# Patient Record
Sex: Male | Born: 1970
Health system: Southern US, Community
[De-identification: ages and names within clinical notes are randomized; demographics above are authoritative.]

## PROBLEM LIST (undated history)

## (undated) DIAGNOSIS — R42 Dizziness and giddiness: Secondary | ICD-10-CM

## (undated) DIAGNOSIS — S9032XA Contusion of left foot, initial encounter: Secondary | ICD-10-CM

## (undated) DIAGNOSIS — K219 Gastro-esophageal reflux disease without esophagitis: Secondary | ICD-10-CM

## (undated) DIAGNOSIS — R55 Syncope and collapse: Secondary | ICD-10-CM

## (undated) DIAGNOSIS — R002 Palpitations: Secondary | ICD-10-CM

## (undated) DIAGNOSIS — E119 Type 2 diabetes mellitus without complications: Secondary | ICD-10-CM

## (undated) DIAGNOSIS — I2510 Atherosclerotic heart disease of native coronary artery without angina pectoris: Secondary | ICD-10-CM

## (undated) DIAGNOSIS — R9431 Abnormal electrocardiogram [ECG] [EKG]: Secondary | ICD-10-CM

## (undated) HISTORY — PX: NO PAST SURGERIES: SHX2092

## (undated) HISTORY — DX: Gastro-esophageal reflux disease without esophagitis: K21.9

## (undated) HISTORY — DX: Atherosclerotic heart disease of native coronary artery without angina pectoris: I25.10

## (undated) HISTORY — DX: Palpitations: R00.2

## (undated) HISTORY — DX: Syncope and collapse: R55

## (undated) HISTORY — DX: Abnormal electrocardiogram (ECG) (EKG): R94.31

## (undated) HISTORY — DX: Dizziness and giddiness: R42

## (undated) HISTORY — DX: Contusion of left foot, initial encounter: S90.32XA

---

## 1999-05-28 ENCOUNTER — Emergency Department (HOSPITAL_COMMUNITY): Admission: EM | Admit: 1999-05-28 | Discharge: 1999-05-28 | Payer: Self-pay | Admitting: *Deleted

## 2009-06-13 ENCOUNTER — Emergency Department (HOSPITAL_COMMUNITY): Admission: EM | Admit: 2009-06-13 | Discharge: 2009-06-13 | Payer: Self-pay | Admitting: Emergency Medicine

## 2010-04-27 ENCOUNTER — Observation Stay (HOSPITAL_COMMUNITY)
Admission: EM | Admit: 2010-04-27 | Discharge: 2010-04-29 | Payer: Self-pay | Source: Home / Self Care | Attending: Internal Medicine | Admitting: Internal Medicine

## 2010-04-27 LAB — URINALYSIS, ROUTINE W REFLEX MICROSCOPIC
Hgb urine dipstick: NEGATIVE
Ketones, ur: NEGATIVE mg/dL
Protein, ur: NEGATIVE mg/dL
Urobilinogen, UA: 0.2 mg/dL (ref 0.0–1.0)

## 2010-04-27 LAB — DIFFERENTIAL
Basophils Absolute: 0 10*3/uL (ref 0.0–0.1)
Basophils Relative: 0 % (ref 0–1)
Lymphocytes Relative: 25 % (ref 12–46)
Lymphs Abs: 1.7 10*3/uL (ref 0.7–4.0)
Monocytes Relative: 6 % (ref 3–12)
Neutro Abs: 4.7 10*3/uL (ref 1.7–7.7)
Neutrophils Relative %: 68 % (ref 43–77)

## 2010-04-27 LAB — BASIC METABOLIC PANEL
BUN: 9 mg/dL (ref 6–23)
CO2: 25 mEq/L (ref 19–32)
Calcium: 8.9 mg/dL (ref 8.4–10.5)
Chloride: 98 mEq/L (ref 96–112)
GFR calc Af Amer: >60 mL/min (ref >60)
GFR calc non Af Amer: >60 mL/min (ref >60)
Potassium: 4 mEq/L (ref 3.5–5.1)

## 2010-04-27 LAB — CBC
Hemoglobin: 13.6 g/dL (ref 13.0–17.0)
Platelets: 234 10*3/uL (ref 150–400)
WBC: 6.9 10*3/uL (ref 4.0–10.5)

## 2010-04-27 LAB — URINE MICROSCOPIC-ADD ON: Urine-Other: NONE SEEN

## 2010-04-28 LAB — COMPREHENSIVE METABOLIC PANEL
Albumin: 3 g/dL — ABNORMAL LOW (ref 3.5–5.2)
Calcium: 8.4 mg/dL (ref 8.4–10.5)
GFR calc Af Amer: >60 mL/min (ref >60)
GFR calc non Af Amer: >60 mL/min (ref >60)
Potassium: 3.9 mEq/L (ref 3.5–5.1)
Sodium: 135 mEq/L (ref 135–145)
Total Bilirubin: 0.6 mg/dL (ref 0.3–1.2)
Total Protein: 6.2 g/dL (ref 6.0–8.3)

## 2010-04-28 LAB — LIPID PANEL
Cholesterol: 186 mg/dL (ref 0–200)
HDL: 27 mg/dL — ABNORMAL LOW (ref >39)
Total CHOL/HDL Ratio: 6.9 RATIO
Triglycerides: 184 mg/dL — ABNORMAL HIGH (ref <150)

## 2010-04-28 LAB — CBC
HCT: 37.3 % — ABNORMAL LOW (ref 39.0–52.0)
Hemoglobin: 12.6 g/dL — ABNORMAL LOW (ref 13.0–17.0)
MCH: 28.1 pg (ref 26.0–34.0)
Platelets: 196 10*3/uL (ref 150–400)
RDW: 12.6 % (ref 11.5–15.5)

## 2010-04-28 LAB — HEMOGLOBIN A1C: Mean Plasma Glucose: 372 mg/dL — ABNORMAL HIGH (ref <117)

## 2010-04-28 LAB — GLUCOSE, CAPILLARY: Glucose-Capillary: 250 mg/dL — ABNORMAL HIGH (ref 70–99)

## 2010-04-29 LAB — GLUCOSE, CAPILLARY: Glucose-Capillary: 274 mg/dL — ABNORMAL HIGH (ref 70–99)

## 2010-04-30 LAB — GLUCOSE, CAPILLARY: Glucose-Capillary: 470 mg/dL — ABNORMAL HIGH (ref 70–99)

## 2010-04-30 NOTE — H&P (Addendum)
NAMESALIH, WILLIAMSON               ACCOUNT NO.:  [id number]  MEDICAL RECORD NO.:  [id number]          PATIENT TYPE:  EMS  LOCATION:  ED                           FACILITY:  [facility]  PHYSICIAN:  Peggye Pitt, M.D. DATE OF BIRTH:  1971-03-22  DATE OF ADMISSION:  04/27/2010 DATE OF DISCHARGE:                             HISTORY & PHYSICAL   PRIMARY CARE PHYSICIAN:  He is unassigned.  CHIEF COMPLAINT:  Dizziness and fatigue.  HISTORY OF PRESENT ILLNESS:  Mr. Bushong is a very pleasant 40 year old Hispanic male who speaks English very well who was born and raised in [place], but his descenders are [place]. He notes that for the past approximately 6 months, he has been feeling more dizzy and fatigued.  He works as a Surveyor, minerals and has noticed that while walking up 3 flights of steps has become very difficult for him.  He has also noticed an unintentional 30-pound weight loss over the past 2 months as well as significant polyuria, polydipsia, and polyphagia.  He does not have a primary care physician.  Because of this, he came to the emergency department today where he was found to have a blood sugar of 470 and we are asked to assist with evaluation.  ALLERGIES:  No known drug allergies.  PAST MEDICAL HISTORY:  Nonsignificant.  HOME MEDICATIONS:  None.  SOCIAL HISTORY:  Denies any alcohol, tobacco, or illicit drug use.  He has 4 children, aged 4 down to one.  Works as a Associate Professor.  FAMILY HISTORY:  Significant for diabetes in mother, a brother, and multiple aunts and uncles.  REVIEW OF SYSTEMS:  Negative except as already mentioned in history of present illness.  PHYSICAL EXAMINATION:  GENERAL:  He is alert, awake, and oriented x3, does not appear to be in any distress.  Does have dry mucous membranes. VITAL SIGNS:  On admission, blood pressure 134/82, heart rate 95, respirations 16, saturations 99% on room air with a temperature of 98.2. HEENT:   Normocephalic, atraumatic.  Pupils are equal, round, and reactive with intact extraocular movements. NECK:  Supple.  No JVD, no lymphadenopathy, no bruits, no goiter. HEART:  Regular rate and rhythm with no murmurs, rubs, or gallops. LUNGS:  Clear to auscultation bilaterally. ABDOMEN:  Soft, nontender, and nondistended with positive bowel sounds. EXTREMITIES:  No clubbing, cyanosis, or edema with positive pedal pulses. NEUROLOGIC:  Grossly intact and nonfocal.  LABORATORY DATA:  On admission, sodium 133, potassium 4.0, chloride 98, bicarbonate 25, BUN 9, creatinine 0.87, glucose of 513, WBCs 6.9, hemoglobin 13.6, platelets of 234.  Urinalysis that shows greater than 1000 glucose and is significant for no protein and otherwise negative.  ASSESSMENT AND PLAN:  New-onset diabetes mellitus.  This could certainly explain all of Bandon symptoms including weakness, dizziness, fatigue, polydipsia, polyuria, polyphagia, weight loss.  It appears that he is also developing some early onset peripheral neuropathy given tingling in his toes and fingertips.  At this point, I believe that an admission to the hospital for 23-hour observation is warranted mainly for IV hydration as well as diabetic education.  We will also  need to have case management of system with finding a primary care physician as well as with medication assistance.  It is most likely that Chawn is a type 1 diabetic.  His A1c is pending at this time.  Regardless, whether he is a type 1 or type 2 diabetic, I believe he will need insulin at least transiently at the beginning of his treatment.  For that reason, I have started him on Lantus 10 units subcutaneous as well as a moderate sliding scale insulin.  In the emergency department, he was given 15 units of NovoLog and his most recent sugar was 257.  I have also ordered a microalbumin if he does have evidence of microalbuminuria that he may benefit from a low-dose ACE inhibitor prior  to discharge, for renal protection.  For prophylaxis, he is completely ambulatory, but we will place SCDs while he is in bed.     Peggye Pitt, M.D.     EH/MEDQ  D:  04/27/2010  T:  04/27/2010  Job:  [id number]  Electronically Signed by Peggye Pitt M.D. on 04/30/2010 08:08:34 AM

## 2010-05-10 NOTE — Discharge Summary (Signed)
  Micheal Neal, Micheal Neal               ACCOUNT NO.:  [id number]  MEDICAL RECORD NO.:  [id number]          PATIENT TYPE:  OBV  LOCATION:  1302                         FACILITY:  [facility]  PHYSICIAN:  Calvert Cantor, M.D.     DATE OF BIRTH:  1971/02/08  DATE OF ADMISSION:  04/27/2010 DATE OF DISCHARGE:                              DISCHARGE SUMMARY   PRIMARY CARE PHYSICIAN:  None.  PRESENTING COMPLAINT:  Dizziness and fatigue.  DISCHARGE DIAGNOSES: 1. New onset diabetes, insulin-requiring, still uncertain whether it     is type 1 or type 2. 2. Hyperlipidemia. 3. Obesity.  DISCHARGE MEDICATIONS: 1. Lantus 14 units at bedtime.  He is advised to keep titrating his     Lantus up by 2 units every other night until his a.m. or fasting     sugar is less than 100. 2. NovoLog.  He is to take 4 units with each meal and an additional     NovoLog based on sliding scale also before each meal. 3. Zocor 10 mg at bedtime.  HOSPITAL COURSE:  This is a 40 year old male who came into the hospital with complaints of severe fatigue, dizziness, and a 30-pound weight loss over the past 2 months.  The patient was found to have diabetes.  He was admitted for inpatient treatment because he does not have a PCP.  He was initiated on insulin.  He has been given 10 units of Lantus every night. Sugars are still running in the high 200s.  This morning, it was 274. Last night, it was 246 and therefore increasing his Lantus slightly and recommending to him that he increases as I have advised until sugars are better controlled.  He states he does feel much stronger.  He is not urinating as frequently anymore.  The patient does not have a PCP and does not have any insurance and his social worker was working on finding him PCP and helping him apply for Medicaid to see if he is eligible.  In addition, he will be receiving his medications on discharge today to our hospital's indigent fund.  The patient has been educated  thoroughly regarding diabetes.  He has had nutritionist consult as well.  I have recommended that he lose at least another 30 pounds through diet and exercise.  PHYSICAL EXAMINATION ON DISCHARGE:  LUNGS:  Clear. HEART: Regular rate and rhythm.  No murmurs. ABDOMEN:  Soft, nontender, nondistended.  Bowel sounds positive.  CONDITION ON DISCHARGE:  Stable.  FOLLOW UP:  Will plan on PTT for him to follow up within 1 to 2 weeks.  Time on discharge was 60 minutes.     Calvert Cantor, M.D.     SR/MEDQ  D:  04/29/2010  T:  04/29/2010  Job:  [id number]  Electronically Signed by Calvert Cantor M.D. on 05/10/2010 12:28:52 PM

## 2010-06-25 LAB — COMPREHENSIVE METABOLIC PANEL WITH GFR
ALT: 124 U/L — ABNORMAL HIGH (ref 0–53)
AST: 133 U/L — ABNORMAL HIGH (ref 0–37)
Albumin: 4.2 g/dL (ref 3.5–5.2)
Alkaline Phosphatase: 85 U/L (ref 39–117)
BUN: 13 mg/dL (ref 6–23)
CO2: 29 meq/L (ref 19–32)
Calcium: 9.3 mg/dL (ref 8.4–10.5)
Chloride: 101 meq/L (ref 96–112)
Creatinine, Ser: 1.06 mg/dL (ref 0.4–1.5)
GFR calc non Af Amer: >60 mL/min
Glucose, Bld: 192 mg/dL — ABNORMAL HIGH (ref 70–99)
Potassium: 3.9 meq/L (ref 3.5–5.1)
Sodium: 137 meq/L (ref 135–145)
Total Bilirubin: 0.9 mg/dL (ref 0.3–1.2)
Total Protein: 7.8 g/dL (ref 6.0–8.3)

## 2010-06-25 LAB — CBC
HCT: 43.7 % (ref 39.0–52.0)
Hemoglobin: 14.5 g/dL (ref 13.0–17.0)
MCHC: 33.2 g/dL (ref 30.0–36.0)
Platelets: 230 10*3/uL (ref 150–400)
RDW: 13.1 % (ref 11.5–15.5)

## 2010-06-25 LAB — POCT CARDIAC MARKERS
CKMB, poc: 3.1 ng/mL (ref 1.0–8.0)
Myoglobin, poc: 89 ng/mL (ref 12–200)

## 2010-06-25 LAB — DIFFERENTIAL
Basophils Absolute: 0 K/uL (ref 0.0–0.1)
Basophils Relative: 0 % (ref 0–1)
Eosinophils Absolute: 0.1 K/uL (ref 0.0–0.7)
Eosinophils Relative: 2 % (ref 0–5)
Lymphocytes Relative: 26 % (ref 12–46)
Lymphs Abs: 2 K/uL (ref 0.7–4.0)
Monocytes Absolute: 0.4 K/uL (ref 0.1–1.0)
Monocytes Relative: 5 % (ref 3–12)
Neutro Abs: 5.2 K/uL (ref 1.7–7.7)
Neutrophils Relative %: 68 % (ref 43–77)

## 2011-01-01 ENCOUNTER — Emergency Department (HOSPITAL_COMMUNITY): Payer: Self-pay

## 2011-01-01 ENCOUNTER — Emergency Department (HOSPITAL_COMMUNITY)
Admission: EM | Admit: 2011-01-01 | Discharge: 2011-01-01 | Disposition: A | Discharge status: Home / Self Care | Payer: Self-pay | Attending: Emergency Medicine | Admitting: Emergency Medicine

## 2011-01-01 DIAGNOSIS — R059 Cough, unspecified: Secondary | ICD-10-CM | POA: Insufficient documentation

## 2011-01-01 DIAGNOSIS — E119 Type 2 diabetes mellitus without complications: Secondary | ICD-10-CM | POA: Insufficient documentation

## 2011-01-01 DIAGNOSIS — Z794 Long term (current) use of insulin: Secondary | ICD-10-CM | POA: Insufficient documentation

## 2011-01-01 DIAGNOSIS — Z91199 Patient's noncompliance with other medical treatment and regimen due to unspecified reason: Secondary | ICD-10-CM | POA: Insufficient documentation

## 2011-01-01 DIAGNOSIS — R071 Chest pain on breathing: Secondary | ICD-10-CM | POA: Insufficient documentation

## 2011-01-01 DIAGNOSIS — Z9119 Patient's noncompliance with other medical treatment and regimen: Secondary | ICD-10-CM | POA: Insufficient documentation

## 2011-01-01 DIAGNOSIS — R05 Cough: Secondary | ICD-10-CM | POA: Insufficient documentation

## 2011-01-01 LAB — GLUCOSE, CAPILLARY: Glucose-Capillary: 407 mg/dL — ABNORMAL HIGH (ref 70–99)

## 2011-01-01 LAB — POCT I-STAT, CHEM 8
Chloride: 99 mEq/L (ref 96–112)
Creatinine, Ser: 0.8 mg/dL (ref 0.50–1.35)
Glucose, Bld: 395 mg/dL — ABNORMAL HIGH (ref 70–99)
Hemoglobin: 14.6 g/dL (ref 13.0–17.0)
Potassium: 4.4 mEq/L (ref 3.5–5.1)

## 2011-01-02 LAB — GLUCOSE, CAPILLARY

## 2011-02-20 ENCOUNTER — Ambulatory Visit (INDEPENDENT_AMBULATORY_CARE_PROVIDER_SITE_OTHER): Payer: Self-pay | Admitting: Family Medicine

## 2011-02-20 ENCOUNTER — Encounter: Payer: Self-pay | Admitting: Family Medicine

## 2011-02-20 DIAGNOSIS — E1165 Type 2 diabetes mellitus with hyperglycemia: Secondary | ICD-10-CM | POA: Insufficient documentation

## 2011-02-20 DIAGNOSIS — E119 Type 2 diabetes mellitus without complications: Secondary | ICD-10-CM

## 2011-02-20 LAB — POCT GLYCOSYLATED HEMOGLOBIN (HGB A1C): Hemoglobin A1C: 10.2

## 2011-02-20 MED ORDER — ROSUVASTATIN CALCIUM 10 MG PO TABS: 10.0000 mg | ORAL_TABLET | Freq: Every day | ORAL | Status: DC

## 2011-02-20 MED ORDER — ASPIRIN EC 81 MG PO TBEC: 81.0000 mg | DELAYED_RELEASE_TABLET | Freq: Every day | ORAL | Status: AC

## 2011-02-20 MED ORDER — METFORMIN HCL 500 MG PO TABS: 500.0000 mg | ORAL_TABLET | Freq: Two times a day (BID) | ORAL | Status: DC

## 2011-02-20 MED ORDER — INSULIN ASPART 100 UNIT/ML ~~LOC~~ SOLN: 4.0000 [IU] | Freq: Three times a day (TID) | SUBCUTANEOUS | Status: DC

## 2011-02-20 MED ORDER — INSULIN GLARGINE 100 UNIT/ML ~~LOC~~ SOLN: 20.0000 [IU] | Freq: Every day | SUBCUTANEOUS | Status: DC

## 2011-02-20 NOTE — Patient Instructions (Signed)
It was a pleasure to meet you today; I am glad to be your new primary care doctor.  I wrote you prescriptions for the insulins and for metformin 500mg , take 1 tablet with meals in the morning.  After 2 weeks, you may increase to 1 tablet twice daily (breakfast and dinner).   I wrote for the Crestor 10mg  at bedtime.   I recommend you take a baby aspirin (81mg ) one time daily, for heart health.  I would like to see you back in the office in the next 4 to 6 weeks.  Please call with any other issues before then.

## 2011-02-20 NOTE — Progress Notes (Signed)
  Subjective:    Patient ID: Micheal Neal, male    DOB: 12-23-1970, 40 y.o.   MRN: [id number]  HPI New patient to our practice, comes to establish care for his DM.  Had been seenin ED in January 2012 and diagnosed with DM, had A1C >14%.  Started on Lantus and Novolog there, but ran out.  Was around 6 months without meds.  Also prescribed Crestor 10mg  daily at ED.  Qualified for MAP program, where he gets meds.  Going to talk with Rudell Cobb for [employer].   Family Hx; Mother with heart disease, HTN and lipids.  Brothers with DM. Father died at age 73 of "Clot in brain", had elevated lipids as well.   Social Hx; Works sporadically helping with painting.  Has 4 children (2 from ex-wife, 2 from present wife).  Never smoker, no alcohol and no drug use.   Review of Systems  Denies weight changes now (initially with 40 lb weight loss in January); no polyuria or polydipsia now (had these before insulin therapy); no fevers or chills.  No chest pain presently (had this in ED in early October, ER notes reviewed).  No cough or shortness of breath     Objective:   Physical Exam Well appearing, no apparent distress HEENT Neck supple. PERRL. Moist mucus membranes. No cervical adenoapthy COR S1S2, no extra sounds PULM Clear bilaterally. No rales or wheezes ABD Soft, nontender. LEs: No foot lesions or maceration between toes.  Sensation grossly intact in all areas of foot on exam. No edema.  Palpable dp pulses bilaterally.       Assessment & Plan:

## 2011-02-20 NOTE — Assessment & Plan Note (Signed)
Patient diagnosed with DM in ED in January 2012 after found to have profound hyperglycemia and an A1C>14%; started on Lantus and Novolog from ED then, but ran out and was around 6 months without insulin.  Recently restarted insulin 2 weeks ago, has had CBGs at home ranging from 120-210 on glucometer (brought it in today).  No longer having polydipsia or polyuria, or weight loss.    To check A1C today, as well as baseline labs.  Will need retinal exam, urine microalbumin in future visits.  Will start on Metformin 500mg  one time daily for 2 weeks, then up to BID.  Discussed that Metformin should not bottom his sugars out, however should be on the lookout for symptoms of hypoglycemia (discussed this).  May be able to reduce Lantus dosing if improved glycemic control.   WIll discuss ACEI with him at future visit, as well as nutritionist consult in our office.   Follow up 6 weeks.

## 2011-02-21 LAB — COMPREHENSIVE METABOLIC PANEL
ALT: 37 U/L (ref 0–53)
AST: 44 U/L — ABNORMAL HIGH (ref 0–37)
Albumin: 4.6 g/dL (ref 3.5–5.2)
Alkaline Phosphatase: 85 U/L (ref 39–117)
Potassium: 4.2 mEq/L (ref 3.5–5.3)
Sodium: 138 mEq/L (ref 135–145)
Total Protein: 7.6 g/dL (ref 6.0–8.3)

## 2011-03-06 ENCOUNTER — Encounter: Payer: Self-pay | Admitting: Family Medicine

## 2012-01-29 ENCOUNTER — Other Ambulatory Visit: Payer: Self-pay | Admitting: Family Medicine

## 2012-01-29 MED ORDER — ROSUVASTATIN CALCIUM 10 MG PO TABS: 10.0000 mg | ORAL_TABLET | Freq: Every day | ORAL | Status: DC

## 2012-01-29 NOTE — Telephone Encounter (Signed)
Refill request for Crestor from [facility] Department.  I am sending by fax for 3 months; I am sending him a letter asking for a follow up visit.  Paula Compton, MD

## 2012-02-10 ENCOUNTER — Encounter: Payer: Self-pay | Admitting: Home Health Services

## 2012-02-12 ENCOUNTER — Encounter: Payer: Self-pay | Admitting: Home Health Services

## 2012-06-17 ENCOUNTER — Encounter: Payer: Self-pay | Admitting: *Deleted

## 2012-07-03 ENCOUNTER — Ambulatory Visit (INDEPENDENT_AMBULATORY_CARE_PROVIDER_SITE_OTHER): Payer: Medicaid Other | Admitting: Family Medicine

## 2012-07-03 ENCOUNTER — Encounter: Payer: Self-pay | Admitting: Family Medicine

## 2012-07-03 VITALS — BP 126/83 | HR 82 | Ht 73.0 in | Wt 258.0 lb

## 2012-07-03 DIAGNOSIS — E1165 Type 2 diabetes mellitus with hyperglycemia: Secondary | ICD-10-CM

## 2012-07-03 LAB — COMPREHENSIVE METABOLIC PANEL
Albumin: 4.3 g/dL (ref 3.5–5.2)
Alkaline Phosphatase: 78 U/L (ref 39–117)
BUN: 13 mg/dL (ref 6–23)
Glucose, Bld: 284 mg/dL — ABNORMAL HIGH (ref 70–99)
Total Bilirubin: 0.8 mg/dL (ref 0.3–1.2)

## 2012-07-03 LAB — LIPID PANEL
Cholesterol: 260 mg/dL — ABNORMAL HIGH (ref 0–200)
Total CHOL/HDL Ratio: 6.7 Ratio
VLDL: 72 mg/dL — ABNORMAL HIGH (ref 0–40)

## 2012-07-03 LAB — CBC
Hemoglobin: 14.8 g/dL (ref 13.0–17.0)
MCH: 28.7 pg (ref 26.0–34.0)
MCV: 83.7 fL (ref 78.0–100.0)
RBC: 5.16 MIL/uL (ref 4.22–5.81)

## 2012-07-03 MED ORDER — GLUCOSE BLOOD VI STRP: ORAL_STRIP | Status: DC

## 2012-07-03 MED ORDER — LOVASTATIN 40 MG PO TABS: 40.0000 mg | ORAL_TABLET | Freq: Every day | ORAL | Status: DC

## 2012-07-03 MED ORDER — LISINOPRIL 5 MG PO TABS: 5.0000 mg | ORAL_TABLET | Freq: Every day | ORAL | Status: DC

## 2012-07-03 MED ORDER — ASPIRIN 81 MG PO TBEC: 81.0000 mg | DELAYED_RELEASE_TABLET | Freq: Every day | ORAL | Status: DC

## 2012-07-03 MED ORDER — INSULIN GLARGINE 100 UNIT/ML ~~LOC~~ SOLN: 40.0000 [IU] | Freq: Every day | SUBCUTANEOUS | Status: DC

## 2012-07-03 MED ORDER — METFORMIN HCL 1000 MG PO TABS: 1000.0000 mg | ORAL_TABLET | Freq: Two times a day (BID) | ORAL | Status: DC

## 2012-07-03 MED ORDER — INSULIN ASPART 100 UNIT/ML ~~LOC~~ SOLN: 20.0000 [IU] | Freq: Three times a day (TID) | SUBCUTANEOUS | Status: DC

## 2012-07-03 NOTE — Progress Notes (Signed)
  Subjective:    Patient ID: Micheal Neal, male    DOB: [birth year], 42 y.o.   MRN: [id number]  HPI Patient here for follow up on diabetes.  He has been feeling ill in recent months, with associated thirst, polyuria and weight loss.  He has not been here in about 1-1/2 years, and he has not been seeing any other physicians since then, cites cost as the main barrier.  He reports that he has been obtaining his Lantus and Novolog insulin at the MAP program, but that he was told he needed a new Rx in order to continue to get the meds.  He has been using Lantus 20 units/day for the past 4 months, and Novolog 10 units with meals 3x/day.  He has been taking Metformin 1000mg , 1/2 tablet twice a day.    He checks CBGs at home (Relion meter), readings always above 250, often as high as 400.  He does not notice much of a reduction when he uses the Novolog (describes a 10 point drop after Novolog).  No episodes of hypoglycemia.   Social Hx; He does not smoke nor drink alcohol.  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  He quit sodas completely when he was first diagnosed with DM.    Review of Systems No chest pain or dyspnea, no fevers or chills.  He has had some nausea but no vomiting.  Polyuria but no pain with void.     Objective:   Physical Exam Generally well appearing, no apparent distress HEENT Neck supple. Mildly dry mucus membranes. No cervical adenopathy  COR Regular S1S2, no extra sounds PULM Clear bilaterally, no rales or wheezes LEs; No lesions seen on feet; no maceration of skin in between toes.  Monofilament foot exam today unremarkable with exception of decreased sensation in R great toe.  Palpable dp pulses bilaterally.        Assessment & Plan:

## 2012-07-03 NOTE — Assessment & Plan Note (Addendum)
Poor control (A1C 14% today).  Associated weight loss, polyuria/polydipsia.  Will make increases in his lantus and mealtime Novolog insulin, continue checking while fasting and at mealtimes.  PharmD consult for assistance in getting control of his DM.  Starting an ASA and ACEI today, rechecking labs.  I have stressed to him that maintaining control of his diabetes is going to require work on his part, including adhering to follow up visit schedules.  We cannot take good care of him unless he remains engaged.  He voices understanding of this.

## 2012-07-03 NOTE — Patient Instructions (Addendum)
It was a pleasure to see you today. I believe your tiredness is due to your persistently high sugars.   I am asking you to raise your Lantus insulin to 30 units at bedtime; Novolog to 20 units three times daily at mealtime. Keep checking your glucose as you have been doing.  Start baby aspirin 81mg  daily, for heart protection.   I am starting you on a medicine called lisinopril 5mg  daily for kidney protection.   I am changing your cholesterol medicine to Lovastatin 40mg , one time daily.  I am taking Crestor off your medication list.   Take the metformin 1000mg  two times daily with meals.  This is free in the [employer].   [facility] FOR DIABETES CONTROL AS SOON AS POSSIBLE>  BRING GLUCOMETER TO THIS APPOINTMENT. FOR FOLLOW UP WITH DR Mauricio Po IN 4 to 6 WEEKS.

## 2012-07-06 ENCOUNTER — Encounter: Payer: Self-pay | Admitting: Family Medicine

## 2012-07-20 ENCOUNTER — Encounter: Payer: Self-pay | Admitting: Pharmacist

## 2012-07-23 ENCOUNTER — Ambulatory Visit: Payer: Medicaid Other | Admitting: Pharmacist

## 2012-08-07 ENCOUNTER — Ambulatory Visit: Payer: Medicaid Other | Admitting: Family Medicine

## 2013-02-01 ENCOUNTER — Encounter (HOSPITAL_COMMUNITY): Payer: Self-pay | Admitting: Emergency Medicine

## 2013-02-01 ENCOUNTER — Emergency Department (HOSPITAL_COMMUNITY)
Admission: EM | Admit: 2013-02-01 | Discharge: 2013-02-01 | Disposition: A | Discharge status: Home / Self Care | Payer: Medicaid Other | Attending: Emergency Medicine | Admitting: Emergency Medicine

## 2013-02-01 DIAGNOSIS — Z7982 Long term (current) use of aspirin: Secondary | ICD-10-CM | POA: Insufficient documentation

## 2013-02-01 DIAGNOSIS — Z79899 Other long term (current) drug therapy: Secondary | ICD-10-CM | POA: Insufficient documentation

## 2013-02-01 DIAGNOSIS — H5712 Ocular pain, left eye: Secondary | ICD-10-CM

## 2013-02-01 DIAGNOSIS — Z794 Long term (current) use of insulin: Secondary | ICD-10-CM | POA: Insufficient documentation

## 2013-02-01 DIAGNOSIS — IMO0001 Reserved for inherently not codable concepts without codable children: Secondary | ICD-10-CM | POA: Insufficient documentation

## 2013-02-01 DIAGNOSIS — L738 Other specified follicular disorders: Secondary | ICD-10-CM | POA: Insufficient documentation

## 2013-02-01 DIAGNOSIS — L739 Follicular disorder, unspecified: Secondary | ICD-10-CM

## 2013-02-01 DIAGNOSIS — E1165 Type 2 diabetes mellitus with hyperglycemia: Secondary | ICD-10-CM

## 2013-02-01 DIAGNOSIS — H5710 Ocular pain, unspecified eye: Secondary | ICD-10-CM | POA: Insufficient documentation

## 2013-02-01 HISTORY — DX: Type 2 diabetes mellitus without complications: E11.9

## 2013-02-01 LAB — BASIC METABOLIC PANEL
CO2: 24 mEq/L (ref 19–32)
Calcium: 9.3 mg/dL (ref 8.4–10.5)
Chloride: 93 mEq/L — ABNORMAL LOW (ref 96–112)
Creatinine, Ser: 0.66 mg/dL (ref 0.50–1.35)
Glucose, Bld: 472 mg/dL — ABNORMAL HIGH (ref 70–99)
Potassium: 3.9 mEq/L (ref 3.5–5.1)

## 2013-02-01 LAB — CBC
HCT: 41.3 % (ref 39.0–52.0)
Hemoglobin: 14.6 g/dL (ref 13.0–17.0)
MCH: 28.6 pg (ref 26.0–34.0)
MCV: 81 fL (ref 78.0–100.0)
Platelets: 208 10*3/uL (ref 150–400)
RBC: 5.1 MIL/uL (ref 4.22–5.81)
WBC: 6.7 10*3/uL (ref 4.0–10.5)

## 2013-02-01 LAB — GLUCOSE, CAPILLARY: Glucose-Capillary: 388 mg/dL — ABNORMAL HIGH (ref 70–99)

## 2013-02-01 MED ORDER — SODIUM CHLORIDE 0.9 % IV BOLUS (SEPSIS)
1000.0000 mL | Freq: Once | INTRAVENOUS | Status: AC
  Administered 2013-02-01: 1000 mL via INTRAVENOUS

## 2013-02-01 MED ORDER — DOXYCYCLINE HYCLATE 100 MG PO CAPS: 100.0000 mg | ORAL_CAPSULE | Freq: Two times a day (BID) | ORAL | Status: DC

## 2013-02-01 MED ORDER — TETRACAINE HCL 0.5 % OP SOLN
2.0000 [drp] | Freq: Once | OPHTHALMIC | Status: AC
  Administered 2013-02-01: 2 [drp] via OPHTHALMIC
  Filled 2013-02-01: qty 2

## 2013-02-01 MED ORDER — GLUCOSE BLOOD VI STRP: ORAL_STRIP | Status: DC

## 2013-02-01 NOTE — ED Notes (Addendum)
Pt c/o left eye irritation x [redacted] weeks along with a cyst under right arm pit. Denies injury to left eye but states at times when he looks at things he gets a headache around eye.

## 2013-02-01 NOTE — ED Notes (Signed)
20/40 left eye; 20/30 right eye

## 2013-02-01 NOTE — Progress Notes (Signed)
[facility] provided pt with a list of primary care resources and a [facility] [employer] Card application.  °

## 2013-02-01 NOTE — ED Provider Notes (Signed)
CSN: [id number]     Arrival date & time 02/01/13  7829 History   First MD Initiated Contact with Patient 02/01/13 0825     Chief Complaint  Patient presents with  . Cyst  . Eye Problem   (Consider location/radiation/quality/duration/timing/severity/associated sxs/prior Treatment) Patient is a 42 y.o. male presenting with eye problem.  Eye Problem  Pt with history of poorly controlled DM without ongoing medical care takes Lantus daily but does not use his Novolog since he is out of his glucometer test strips. He reports 2 weeks of L eye irritation. Some pain, particularly early in the morning, no drainage, no crusting. He does not report any blurry vision but wife at bedside states he has to hold things really closely to his face to read.   He also reports a painful lump in his R axilla, ongoing for about 2 weeks. No drainage, no fever.   He has had polyuria and polydipsia with weight loss.  Past Medical History  Diagnosis Date  . Diabetes mellitus without complication    History reviewed. No pertinent past surgical history. No family history on file. History  Substance Use Topics  . Smoking status: Never Smoker   . Smokeless tobacco: Not on file  . Alcohol Use: No    Review of Systems All other systems reviewed and are negative except as noted in HPI.   Allergies  Review of patient's allergies indicates no known allergies.  Home Medications   Current Outpatient Rx  Name  Route  Sig  Dispense  Refill  . aspirin 81 MG EC tablet   Oral   Take 1 tablet (81 mg total) by mouth daily. Swallow whole.   30 tablet   12   . glucose blood (RELION GLUCOSE TEST STRIPS) test strip      Use as instructed   100 each   12   . EXPIRED: insulin aspart (NOVOLOG) 100 UNIT/ML injection   Subcutaneous   Inject 4 Units into the skin 3 (three) times daily before meals.   10 mL   12   . insulin aspart (NOVOLOG) 100 UNIT/ML injection   Subcutaneous   Inject 20 Units into the skin 3  (three) times daily before meals.   2 vial   6   . EXPIRED: insulin glargine (LANTUS) 100 UNIT/ML injection   Subcutaneous   Inject 20 Units into the skin at bedtime.   10 mL   12   . insulin glargine (LANTUS) 100 UNIT/ML injection   Subcutaneous   Inject 0.4 mLs (40 Units total) into the skin at bedtime.   20 mL   12   . lisinopril (PRINIVIL,ZESTRIL) 5 MG tablet   Oral   Take 1 tablet (5 mg total) by mouth daily.   30 tablet   6   . lovastatin (MEVACOR) 40 MG tablet   Oral   Take 1 tablet (40 mg total) by mouth at bedtime.   30 tablet   6   . metFORMIN (GLUCOPHAGE) 1000 MG tablet   Oral   Take 1 tablet (1,000 mg total) by mouth 2 (two) times daily with a meal.   60 tablet   6   . EXPIRED: metFORMIN (GLUCOPHAGE) 500 MG tablet   Oral   Take 1 tablet (500 mg total) by mouth 2 (two) times daily with a meal.   60 tablet   6    BP 127/83  Pulse 100  Temp(Src) 98.6 F (37 C) (Oral)  Resp 20  SpO2 100% Physical Exam  Nursing note and vitals reviewed. Constitutional: He is oriented to person, place, and time. He appears well-developed and well-nourished.  HENT:  Head: Normocephalic and atraumatic.  Eyes: EOM are normal. Pupils are equal, round, and reactive to light.  Mild medial L conjunctival injection but no significant chemosis or drainage. Fundoscopic exam limited by non-dilated eye but no gross abnormalities; anterior chamber is clear. Tonopen is not functioning properly, cannot check pressures  Neck: Normal range of motion. Neck supple.  Cardiovascular: Normal rate, normal heart sounds and intact distal pulses.   Pulmonary/Chest: Effort normal and breath sounds normal.  Abdominal: Bowel sounds are normal. He exhibits no distension. There is no tenderness.  Musculoskeletal: Normal range of motion. He exhibits no edema and no tenderness.  Neurological: He is alert and oriented to person, place, and time. He has normal strength. No cranial nerve deficit or  sensory deficit.  Skin: Skin is warm and dry. No rash noted.  2cm x 3cm tender but not fluctuant subcutaneous mass on R axilla with mild surrounding erythema  Psychiatric: He has a normal mood and affect.    ED Course  Procedures (including critical care time) Labs Review Labs Reviewed  GLUCOSE, CAPILLARY - Abnormal; Notable for the following:    Glucose-Capillary 440 (*)    All other components within normal limits  BASIC METABOLIC PANEL - Abnormal; Notable for the following:    Sodium 130 (*)    Chloride 93 (*)    Glucose, Bld 472 (*)    All other components within normal limits  GLUCOSE, CAPILLARY - Abnormal; Notable for the following:    Glucose-Capillary 388 (*)    All other components within normal limits  CBC   Imaging Review No results found.  EKG Interpretation   None       MDM   1. Left eye pain   2. Folliculitis   3. DM (diabetes mellitus), type 2, uncontrolled     Tonopen now working pressure in L eye is 19, R eye is 21. 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 CBG significantly elevated, will give IVF and check labs for signs of DKA.   10:34 AM No DKA. I suspect his eye complaints are due to his DM, no signs of conjunctivitis. Will rx doxy for R axilla folliculitis/cellulitis. Advised to establish care with [facility] if he is experiencing difficulties with paying for followup appointments. Will need Ophtho referral as well.     Charles B. Bernette Mayers, MD 02/01/13 1036

## 2013-07-24 ENCOUNTER — Other Ambulatory Visit: Payer: Self-pay | Admitting: Family Medicine

## 2013-10-23 ENCOUNTER — Emergency Department (HOSPITAL_COMMUNITY)
Admission: EM | Admit: 2013-10-23 | Discharge: 2013-10-24 | Disposition: A | Discharge status: Home / Self Care | Payer: Medicaid Other | Attending: Emergency Medicine | Admitting: Emergency Medicine

## 2013-10-23 ENCOUNTER — Encounter (HOSPITAL_COMMUNITY): Payer: Self-pay | Admitting: Emergency Medicine

## 2013-10-23 DIAGNOSIS — H5710 Ocular pain, unspecified eye: Secondary | ICD-10-CM | POA: Insufficient documentation

## 2013-10-23 DIAGNOSIS — Z79899 Other long term (current) drug therapy: Secondary | ICD-10-CM | POA: Insufficient documentation

## 2013-10-23 DIAGNOSIS — R51 Headache: Secondary | ICD-10-CM | POA: Insufficient documentation

## 2013-10-23 DIAGNOSIS — Z794 Long term (current) use of insulin: Secondary | ICD-10-CM | POA: Insufficient documentation

## 2013-10-23 DIAGNOSIS — E119 Type 2 diabetes mellitus without complications: Secondary | ICD-10-CM | POA: Insufficient documentation

## 2013-10-23 DIAGNOSIS — H5711 Ocular pain, right eye: Secondary | ICD-10-CM

## 2013-10-23 DIAGNOSIS — Z7982 Long term (current) use of aspirin: Secondary | ICD-10-CM | POA: Insufficient documentation

## 2013-10-23 LAB — I-STAT CHEM 8, ED
BUN: 12 mg/dL (ref 6–23)
CALCIUM ION: 1.23 mmol/L (ref 1.12–1.23)
CREATININE: 0.8 mg/dL (ref 0.50–1.35)
Chloride: 100 mEq/L (ref 96–112)
Glucose, Bld: 196 mg/dL — ABNORMAL HIGH (ref 70–99)
HCT: 51 % (ref 39.0–52.0)
HEMOGLOBIN: 17.3 g/dL — AB (ref 13.0–17.0)
Potassium: 4 mEq/L (ref 3.7–5.3)
Sodium: 137 mEq/L (ref 137–147)
TCO2: 27 mmol/L (ref 0–100)

## 2013-10-23 LAB — CBG MONITORING, ED: Glucose-Capillary: 204 mg/dL — ABNORMAL HIGH (ref 70–99)

## 2013-10-23 MED ORDER — FLUORESCEIN SODIUM 1 MG OP STRP
1.0000 | ORAL_STRIP | Freq: Once | OPHTHALMIC | Status: DC
  Filled 2013-10-23: qty 1

## 2013-10-23 MED ORDER — TETRACAINE HCL 0.5 % OP SOLN
1.0000 [drp] | Freq: Once | OPHTHALMIC | Status: DC
  Filled 2013-10-23: qty 2

## 2013-10-23 NOTE — ED Notes (Signed)
CBG 204. 

## 2013-10-23 NOTE — ED Notes (Signed)
Visual acuity performed. 20/20 in both eyes

## 2013-10-23 NOTE — ED Provider Notes (Signed)
CSN: [id number]     Arrival date & time 10/23/13  1651 History   First MD Initiated Contact with Patient 10/23/13 2007     Chief Complaint  Patient presents with  . Headache     (Consider location/radiation/quality/duration/timing/severity/associated sxs/prior Treatment) HPI Comments: Patient presents emergency department with chief complaint of eye pain. He states the pain is in his right eye. It is worsened with movement. He reports associated headache and photophobia. Symptoms have been worsening for the past 2 days. He also reports some redness of his eye, as well as watery discharge. He denies any known mechanism of injury. He reports some problem couple of weeks ago, which is resolved. He denies any fevers, chills, nausea, vomiting, diarrhea, constipation. He has a history of diabetes. He denies any vision changes.  The history is provided by the patient. No language interpreter was used.    Past Medical History  Diagnosis Date  . Diabetes mellitus without complication    History reviewed. No pertinent past surgical history. History reviewed. No pertinent family history. History  Substance Use Topics  . Smoking status: Never Smoker   . Smokeless tobacco: Not on file  . Alcohol Use: No    Review of Systems  All other systems reviewed and are negative.     Allergies  Review of patient's allergies indicates no known allergies.  Home Medications   Prior to Admission medications   Medication Sig Start Date End Date Taking? Authorizing Provider  aspirin EC 81 MG tablet Take 81 mg by mouth daily.   Yes Historical Provider, MD  insulin aspart (NOVOLOG) 100 UNIT/ML injection Inject 0-18 Units into the skin 3 (three) times daily with meals.    Yes Historical Provider, MD  metFORMIN (GLUCOPHAGE) 1000 MG tablet Take 1,000 mg by mouth 2 (two) times daily with a meal.   Yes Historical Provider, MD  rosuvastatin (CRESTOR) 20 MG tablet Take 20 mg by mouth at bedtime.    Yes  Historical Provider, MD   BP 119/86  Pulse 83  Temp(Src) 98.5 F (36.9 C) (Oral)  Resp 18  Ht 6\' 2"  (1.88 m)  Wt 237 lb 11.2 oz (107.82 kg)  BMI 30.51 kg/m2  SpO2 99% Physical Exam  Nursing note and vitals reviewed. Constitutional: He is oriented to person, place, and time. He appears well-developed and well-nourished.  HENT:  Head: Normocephalic and atraumatic.  No periorbital swelling, erythema, or signs of infection  Eyes: Conjunctivae and EOM are normal. Pupils are equal, round, and reactive to light. Right eye exhibits no discharge. Left eye exhibits no discharge. No scleral icterus.  No foreign body, no fluorescein uptake, visual acuity is 20/20 bilaterally, eye pressure is 16 in the right eye, and 18 in the left eye, the right eye is her markable for some conjunctival erythema, but no gross discharge, and no evidence of entrapment, but pain with eye movement  Neck: Normal range of motion. Neck supple. No JVD present.  Cardiovascular: Normal rate, regular rhythm and normal heart sounds.  Exam reveals no gallop and no friction rub.   No murmur heard. Pulmonary/Chest: Effort normal and breath sounds normal. No respiratory distress. He has no wheezes. He has no rales. He exhibits no tenderness.  Abdominal: Soft. He exhibits no distension and no mass. There is no tenderness. There is no rebound and no guarding.  Musculoskeletal: Normal range of motion. He exhibits no edema and no tenderness.  Neurological: He is alert and oriented to person, place, and time.  Skin: Skin is warm and dry.  Psychiatric: He has a normal mood and affect. His behavior is normal. Judgment and thought content normal.    ED Course  Procedures (including critical care time) Results for orders placed during the hospital encounter of 10/23/13  CBG MONITORING, ED      Result Value Ref Range   Glucose-Capillary 204 (*) 70 - 99 mg/dL  I-STAT CHEM 8, ED      Result Value Ref Range   Sodium 137  137 - 147  mEq/L   Potassium 4.0  3.7 - 5.3 mEq/L   Chloride 100  96 - 112 mEq/L   BUN 12  6 - 23 mg/dL   Creatinine, Ser 1.61  0.50 - 1.35 mg/dL   Glucose, Bld 096 (*) 70 - 99 mg/dL   Calcium, Ion 0.45  4.09 - 1.23 mmol/L   TCO2 27  0 - 100 mmol/L   Hemoglobin 17.3 (*) 13.0 - 17.0 g/dL   HCT 81.1  91.4 - 78.2 %         EKG Interpretation None      MDM   Final diagnoses:  None    Patient with right eye pain x2 days.  Normal eye exam.  Some pain with eye movement.    Patient does have pain with eye movement.  Could be iritis, but given this is his second visit, and he has DM, will order CT.  Discussed this with Dr. Fredderick Phenix, who agrees with the plan.  12:34 AM CT delayed due to multiple trauma patients.  CT is still pending.  Patient signed out to [place], [place], who will continue care.  Plan: If CT is negative, DC to home with abx drops and ophthalmology follow-up.     Roxy Horseman, PA-C 10/24/13 [phone]

## 2013-10-23 NOTE — ED Notes (Signed)
Pt in c/o headache and eye pain, states he is sensitive to light, symptoms x2 days, no distress noted, right eye noted to be red and patient reports drainage

## 2013-10-23 NOTE — ED Notes (Signed)
Pt reports right eye redness, puffiness, and watering x 2 days. States left eye is now starting to do the same. Has DMII and does not manage it well. Was told he had glaucoma and was concerned his diabetes was affecting his eyes. Pt complaining of bilateral eye pain and headache.

## 2013-10-24 ENCOUNTER — Encounter (HOSPITAL_COMMUNITY): Payer: Self-pay

## 2013-10-24 ENCOUNTER — Emergency Department (HOSPITAL_COMMUNITY): Payer: Medicaid Other

## 2013-10-24 MED ORDER — POLYMYXIN B-TRIMETHOPRIM 10000-0.1 UNIT/ML-% OP SOLN: 1.0000 [drp] | OPHTHALMIC | Status: DC

## 2013-10-24 MED ORDER — IOHEXOL 300 MG/ML  SOLN
100.0000 mL | Freq: Once | INTRAMUSCULAR | Status: AC | PRN
  Administered 2013-10-24: 75 mL via INTRAVENOUS

## 2013-10-24 NOTE — ED Provider Notes (Signed)
Medical screening examination/treatment/procedure(s) were performed by non-physician practitioner and as supervising physician I was immediately available for consultation/collaboration.   EKG Interpretation None        Enid SkeensJoshua M Yordi Krager, MD 10/24/13 [phone]

## 2013-10-24 NOTE — Discharge Instructions (Signed)

## 2013-10-24 NOTE — ED Provider Notes (Signed)
Medical screening examination/treatment/procedure(s) were performed by non-physician practitioner and as supervising physician I was immediately available for consultation/collaboration.   EKG Interpretation None        Ishaan Villamar, MD 10/24/13 1623 

## 2013-10-24 NOTE — ED Notes (Signed)
Patient transported to CT 

## 2013-10-24 NOTE — ED Provider Notes (Signed)
0400 - Patient care assumed from Roxy Horsemanobert Browning, PA-C at shift change. Patient pending CT with instruction to d/c if normal. Last time patient was seen on board, imaging in process. Went to check on patient's status at 0300 at which time patient was no longer on ED board. Imaging reviewed at this time which was negative. Spoke with Alessandra GroutKim Kramm, RN who states when she returned to station following movement of critically ill patient, patient was no longer in room. Per notes, Marlyn CorporalKramm states patient discharged by one of the other nurses on the floor when they noticed imaging was negative. There is, however, the possibility of elopement as timeline states, "patient went home". I personally had no contact with this patient prior to their departure from the ED. I did not evaluate the patient after imaging, nor clear them for discharge from the department. However, based on plan received from Felts Mills[place]rowning, [place]C and negative imaging, it appears appropriate for the patient to follow up as an outpatient as instructed in d/c papers provided by Dahlia ClientBrowning, PA-C.  Filed Vitals:   10/23/13 2330 10/23/13 2345 10/24/13 0000 10/24/13 0015  BP: 109/82  110/77 114/82  Pulse: 73 79 69 76  Temp:      TempSrc:      Resp:      Height:      Weight:      SpO2: 96% 99% 98% 98%     Antony MaduraKelly Inger Wiest, PA-C 10/24/13 0405

## 2014-07-01 ENCOUNTER — Ambulatory Visit (INDEPENDENT_AMBULATORY_CARE_PROVIDER_SITE_OTHER): Payer: Self-pay | Admitting: Family Medicine

## 2014-07-01 ENCOUNTER — Encounter: Payer: Self-pay | Admitting: Family Medicine

## 2014-07-01 VITALS — BP 133/92 | HR 96 | Temp 98.9°F | Wt 239.0 lb

## 2014-07-01 DIAGNOSIS — IMO0002 Reserved for concepts with insufficient information to code with codable children: Secondary | ICD-10-CM

## 2014-07-01 DIAGNOSIS — J309 Allergic rhinitis, unspecified: Secondary | ICD-10-CM

## 2014-07-01 DIAGNOSIS — E1165 Type 2 diabetes mellitus with hyperglycemia: Secondary | ICD-10-CM

## 2014-07-01 LAB — COMPREHENSIVE METABOLIC PANEL
ALBUMIN: 4.1 g/dL (ref 3.5–5.2)
ALT: 25 U/L (ref 0–53)
AST: 21 U/L (ref 0–37)
Alkaline Phosphatase: 106 U/L (ref 39–117)
BUN: 12 mg/dL (ref 6–23)
CALCIUM: 8.9 mg/dL (ref 8.4–10.5)
CO2: 27 meq/L (ref 19–32)
Chloride: 96 mEq/L (ref 96–112)
Creat: 0.87 mg/dL (ref 0.50–1.35)
GLUCOSE: 330 mg/dL — AB (ref 70–99)
POTASSIUM: 4.3 meq/L (ref 3.5–5.3)
Sodium: 135 mEq/L (ref 135–145)
TOTAL PROTEIN: 8.1 g/dL (ref 6.0–8.3)
Total Bilirubin: 0.8 mg/dL (ref 0.2–1.2)

## 2014-07-01 LAB — LIPID PANEL
Cholesterol: 258 mg/dL — ABNORMAL HIGH (ref 0–200)
HDL: 33 mg/dL — AB (ref >=40)
LDL Cholesterol: 174 mg/dL — ABNORMAL HIGH (ref 0–99)
Total CHOL/HDL Ratio: 7.8 Ratio
Triglycerides: 256 mg/dL — ABNORMAL HIGH (ref <150)
VLDL: 51 mg/dL — ABNORMAL HIGH (ref 0–40)

## 2014-07-01 LAB — GLUCOSE, CAPILLARY: Glucose-Capillary: 328 mg/dL — ABNORMAL HIGH (ref 70–99)

## 2014-07-01 LAB — POCT GLYCOSYLATED HEMOGLOBIN (HGB A1C): HEMOGLOBIN A1C: 13.8

## 2014-07-01 MED ORDER — INSULIN GLARGINE 100 UNIT/ML ~~LOC~~ SOLN: 12.0000 [IU] | Freq: Every day | SUBCUTANEOUS | Status: DC

## 2014-07-01 MED ORDER — INSULIN ASPART 100 UNIT/ML ~~LOC~~ SOLN: 0.0000 [IU] | Freq: Three times a day (TID) | SUBCUTANEOUS | Status: DC

## 2014-07-01 MED ORDER — CETIRIZINE HCL 10 MG PO TABS: 10.0000 mg | ORAL_TABLET | Freq: Every day | ORAL | Status: DC

## 2014-07-01 MED ORDER — FLUTICASONE PROPIONATE 50 MCG/ACT NA SUSP: 2.0000 | Freq: Every day | NASAL | Status: DC

## 2014-07-01 NOTE — Progress Notes (Signed)
Patient ID: Micheal Neal, male   DOB: 08/25/1970, 44 y.o.   MRN: [id number]  HPI:  URI sx's - has nasal congestion off and on for about a month. No fever, felt warm a few nights ago. Has been coughing.  No sick contacts.  Diabetes:  Previously took Lantus 22 units every night. He has been out of Lantus for a year. He is taking NovoLog 15 units 3 times a day with meals. His sugars stay in the 300s. He has been exercising and walking a lot help control his diabetes. He stopped metformin due to nausea. He has not followed up with us regularly due to financial issues.  ROS: See HPI.  PMFSH:  History of poorly controlled diabetes, does not follow-up regularly  PHYSICAL EXAM: BP 133/92 mmHg  Pulse 96  Temp(Src) 98.9 F (37.2 C) (Oral)  Wt 239 lb (108.41 kg) Gen:  No acute distress, pleasant, cooperative HEENT:  Normocephalic, atraumatic, oropharynx clear and moist, nares with some mild clear congestion, TMs clear bilaterally, no anterior cervical lymphadenopathy Heart:  Regular rate and rhythm, no murmur Lungs:  Clear to auscultation bilaterally, normal restaurant effort Neuro:  Grossly nonfocal, speech normal Ext:  atraumatic  ASSESSMENT/PLAN:  DM (diabetes mellitus), type 2, uncontrolled  Stressed the importance of regular follow-up. Encouraged him to apply for the orange card for financial assistance. I refilled his Lantus and NovoLog. He will start with 12 units of Lantus nightly. I gave him instructions for how to titrate up based on his fasting blood sugars. I also printed and gave him a sliding scale insulin regimen for his NovoLog. This can be seen in the AVS.  Instructed him to follow-up in one month with either myself or with Dr. Mauricio PoBreen, his PCP. Advised that I'm happy to see him as a patient if he would like once Dr. Mauricio PoBreen leaves. We will check lipids and CMET today as well as an A1c.   Allergic rhinitis  Suspect cough and congestion which has gone off and on for the last month due  to allergic rhinitis. Start Zyrtec and Flonase. Follow up in one month to evaluate for improvement.    FOLLOW UP: F/u in one month for diabetes and allergic rhinitis  GrenadaBrittany J. Pollie MeyerMcIntyre, MD [facility]

## 2014-07-01 NOTE — Patient Instructions (Addendum)
Start with 12 units lantus daily. Increase lantus by 1 unit/day if your fasting blood sugar is > 100mg /dl until fasting blood sugars reach 110.  Start zyrtec and flonase for allergies Follow up in 1 month for your diabetes with either Dr. Mauricio PoBreen or myself.  I will call you if your test results are not normal.  Otherwise, I will send you a letter.  If you do not hear from me with in 2 weeks please call our office.     Be well, Dr. Pollie MeyerMcIntyre   60-110 no novolog 111-150 4 units novolog 151-200 8 units novolog 201-250 10 units novolog 251-300 12 units novolog 301-350 14 units novolog >350 16 units novolog

## 2014-07-01 NOTE — Assessment & Plan Note (Addendum)
Stressed the importance of regular follow-up. Encouraged him to apply for the orange card for financial assistance. I refilled his Lantus and NovoLog. He will start with 12 units of Lantus nightly. I gave him instructions for how to titrate up based on his fasting blood sugars. I also printed and gave him a sliding scale insulin regimen for his NovoLog. This can be seen in the AVS.  Instructed him to follow-up in one month with either myself or with Dr. Mauricio PoBreen, his PCP. Advised that I'm happy to see him as a patient if he would like once Dr. Mauricio PoBreen leaves. We will check lipids and CMET today as well as an A1c.

## 2014-07-01 NOTE — Assessment & Plan Note (Signed)
Suspect cough and congestion which has gone off and on for the last month due to allergic rhinitis. Start Zyrtec and Flonase. Follow up in one month to evaluate for improvement.

## 2014-07-02 ENCOUNTER — Encounter: Payer: Self-pay | Admitting: Family Medicine

## 2014-07-06 ENCOUNTER — Ambulatory Visit: Payer: Self-pay

## 2014-07-11 NOTE — Progress Notes (Signed)
I was the preceptor for this visit.  Detra Bores, MD 

## 2014-08-05 ENCOUNTER — Ambulatory Visit (INDEPENDENT_AMBULATORY_CARE_PROVIDER_SITE_OTHER): Payer: Self-pay | Admitting: Family Medicine

## 2014-08-05 ENCOUNTER — Encounter: Payer: Self-pay | Admitting: Family Medicine

## 2014-08-05 VITALS — BP 113/79 | HR 81 | Temp 98.3°F | Ht 74.0 in | Wt 250.6 lb

## 2014-08-05 DIAGNOSIS — E1165 Type 2 diabetes mellitus with hyperglycemia: Secondary | ICD-10-CM

## 2014-08-05 DIAGNOSIS — E118 Type 2 diabetes mellitus with unspecified complications: Secondary | ICD-10-CM

## 2014-08-05 DIAGNOSIS — IMO0002 Reserved for concepts with insufficient information to code with codable children: Secondary | ICD-10-CM

## 2014-08-05 DIAGNOSIS — J309 Allergic rhinitis, unspecified: Secondary | ICD-10-CM

## 2014-08-05 MED ORDER — ATORVASTATIN CALCIUM 20 MG PO TABS: 20.0000 mg | ORAL_TABLET | Freq: Every day | ORAL | Status: DC

## 2014-08-05 NOTE — Patient Instructions (Signed)
I am referring you to an eye doctor for your eye pain You will get a phone call to schedule this appointment.   Start lipitor 20mg  daily for cholesterol  Continue flonase and zyrtec Can try neti pot - buy over the counter  I will call you if your test results are not normal.  Otherwise, I will send you a letter.  If you do not hear from me with in 2 weeks please call our office.      Continue to adjust insulin dosing Fill out blood sugar chart Follow up with me in 1 month.  Be well, Dr. Pollie MeyerMcIntyre

## 2014-08-06 LAB — MICROALBUMIN / CREATININE URINE RATIO
Creatinine, Urine: 171.4 mg/dL
MICROALB UR: 1.1 mg/dL (ref <2.0)
MICROALB/CREAT RATIO: 6.4 mg/g (ref 0.0–30.0)

## 2014-08-09 NOTE — Assessment & Plan Note (Addendum)
Pt will continue titration of lantus, and novolog sliding scale. Gave printed CBG chart for him to complete.  Cardiac: on aspirin, add atorvastatin 20mg  daily today Renal: check urine micro today Eye: refer to ophtho for eval, especially as he endorses chronic eye pain (exam normal today) Foot: due at future visit

## 2014-08-09 NOTE — Assessment & Plan Note (Signed)
Somewhat improved with zyrtec and flonase Add neti pot

## 2014-08-09 NOTE — Progress Notes (Signed)
Patient ID: Micheal Neal, male   DOB: 01/23/1971, 44 y.o.   MRN: [id number] Date of Visit: 08/05/2014    HPI:  F/u DM - taking lantus 18 u daily. Sugars range 190-250 on average. AM sugars in 220's. Has been self-titrating lantus an dfollowing novolog sliding scale. Now has orange card. Last eye doc appt over 1 year ago. Has occasional pain in eyes upon waking, which is gradually improving and has been chronic.   Allergies - taking flonase and zyrtec regularly. Overall improved but still some sinus congestion.  HLD - discussed need for statin based on previous lipids and ASCVD score. Pt is amenable to this.  ROS: See HPI.  PMFSH: hx allergies, type 2 DM  PHYSICAL EXAM: BP 113/79 mmHg  Pulse 81  Temp(Src) 98.3 F (36.8 C) (Oral)  Ht 6\' 2"  (1.88 m)  Wt 250 lb 9.6 oz (113.671 kg)  BMI 32.16 kg/m2  Visual acuity 20/25 R, 20/30 L, 20/20 both Gen: NAD HEENT: NCAT. PERRL, EOMI, no scleral injection or perilimbal redness Heart: RRR no murmur Lungs: CTAB NWOB  Neuro: grossly nonfocal speech normal Ext: No appreciable lower extremity edema bilaterally   ASSESSMENT/PLAN:  DM (diabetes mellitus), type 2, uncontrolled Pt will continue titration of lantus, and novolog sliding scale. Gave printed CBG chart for him to complete.  Cardiac: on aspirin, add atorvastatin 20mg  daily today Renal: check urine micro today Eye: refer to ophtho for eval, especially as he endorses chronic eye pain (exam normal today) Foot: due at future visit   Allergic rhinitis Somewhat improved with zyrtec and flonase Add neti pot    FOLLOW UP: F/u in 1 month for above issues  GrenadaBrittany J. Pollie MeyerMcIntyre, MD [facility]

## 2014-08-10 ENCOUNTER — Encounter: Payer: Self-pay | Admitting: Family Medicine

## 2014-08-17 ENCOUNTER — Telehealth: Payer: Self-pay | Admitting: Family Medicine

## 2014-08-17 MED ORDER — ROSUVASTATIN CALCIUM 10 MG PO TABS: 10.0000 mg | ORAL_TABLET | Freq: Every day | ORAL | Status: DC

## 2014-08-17 MED ORDER — MOMETASONE FUROATE 50 MCG/ACT NA SUSP: 2.0000 | Freq: Every day | NASAL | Status: DC

## 2014-08-17 MED ORDER — DESLORATADINE 5 MG PO TABS: 5.0000 mg | ORAL_TABLET | Freq: Every day | ORAL | Status: DC

## 2014-08-17 NOTE — Telephone Encounter (Signed)
Pt informed. Finnis Colee, CMA  

## 2014-08-17 NOTE — Telephone Encounter (Signed)
Received request from [facility] to change: -atorvastatin to crestor -flonase to nasonex -cetirizine to clarinex  These new meds are available discounted at [facility]. New prescriptions sent in. Also provided requested information about recent labs to [facility], will fax back.  Red team, please call pt and let him know the above medicines will be different at [facility] but should be more affordable. They are acceptable substitutions for what he has been on previously.  Latrelle DodrillBrittany J Margretta Zamorano, MD

## 2014-09-12 ENCOUNTER — Ambulatory Visit (INDEPENDENT_AMBULATORY_CARE_PROVIDER_SITE_OTHER): Payer: Self-pay | Admitting: Family Medicine

## 2014-09-12 VITALS — BP 110/70 | HR 95 | Temp 98.8°F | Ht 74.0 in | Wt 246.0 lb

## 2014-09-12 DIAGNOSIS — IMO0002 Reserved for concepts with insufficient information to code with codable children: Secondary | ICD-10-CM

## 2014-09-12 DIAGNOSIS — H8110 Benign paroxysmal vertigo, unspecified ear: Secondary | ICD-10-CM

## 2014-09-12 DIAGNOSIS — E1165 Type 2 diabetes mellitus with hyperglycemia: Secondary | ICD-10-CM

## 2014-09-12 MED ORDER — MECLIZINE HCL 25 MG PO TABS: 25.0000 mg | ORAL_TABLET | Freq: Three times a day (TID) | ORAL | Status: DC | PRN

## 2014-09-12 NOTE — Patient Instructions (Signed)
Eye doctor to call: [facility] [address] [place], [place] [address]  [phone] Please schedule an appointment with them.  Keep on insulin Look at feet every day  For dizziness try meclizine -sent in prescription  Follow up in 1 month  Be well, Dr. Pollie Meyer    Benign Positional Vertigo Vertigo means you feel like you or your surroundings are moving when they are not. Benign positional vertigo is the most common form of vertigo. Benign means that the cause of your condition is not serious. Benign positional vertigo is more common in older adults. CAUSES  Benign positional vertigo is the result of an upset in the labyrinth system. This is an area in the middle ear that helps control your balance. This may be caused by a viral infection, head injury, or repetitive motion. However, often no specific cause is found. SYMPTOMS  Symptoms of benign positional vertigo occur when you move your head or eyes in different directions. Some of the symptoms may include:  Loss of balance and falls.  Vomiting.  Blurred vision.  Dizziness.  Nausea.  Involuntary eye movements (nystagmus). DIAGNOSIS  Benign positional vertigo is usually diagnosed by physical exam. If the specific cause of your benign positional vertigo is unknown, your caregiver may perform imaging tests, such as magnetic resonance imaging (MRI) or computed tomography (CT). TREATMENT  Your caregiver may recommend movements or procedures to correct the benign positional vertigo. Medicines such as meclizine, benzodiazepines, and medicines for nausea may be used to treat your symptoms. In rare cases, if your symptoms are caused by certain conditions that affect the inner ear, you may need surgery. HOME CARE INSTRUCTIONS   Follow your caregiver's instructions.  Move slowly. Do not make sudden body or head movements.  Avoid driving.  Avoid operating heavy machinery.  Avoid performing any tasks that would be  dangerous to you or others during a vertigo episode.  Drink enough fluids to keep your urine clear or pale yellow. SEEK IMMEDIATE MEDICAL CARE IF:   You develop problems with walking, weakness, numbness, or using your arms, hands, or legs.  You have difficulty speaking.  You develop severe headaches.  Your nausea or vomiting continues or gets worse.  You develop visual changes.  Your family or friends notice any behavioral changes.  Your condition gets worse.  You have a fever.  You develop a stiff neck or sensitivity to light. MAKE SURE YOU:   Understand these instructions.  Will watch your condition.  Will get help right away if you are not doing well or get worse. Document Released: 12/24/2005 Document Revised: 06/10/2011 Document Reviewed: 12/06/2010 [facility] Patient Information 2015 [place], [place]. This information is not intended to replace advice given to you by your health care provider. Make sure you discuss any questions you have with your health care provider.

## 2014-09-12 NOTE — Progress Notes (Signed)
Patient ID: Micheal Neal, male   DOB: April 20, 1970, 44 y.o.   MRN: [id number]  HPI:  DM: continues to titrate lantus and take novolog sliding scale. Up to lantus 21 units. Knows sugars are gradually improving.  Dizziness: gets dizzy with positional changes. No fevers. Does occasionally have blurry vision but no acute vision changes. Has hx of glaucoma, has not followed up with ophtho in a long time. Previously referred him to there but cancelled appt when he realized orange card would not cover it.  Allergies: does well with nasonex and desloratadine.  ROS: See HPI.  PMFSH: hx glacuoma, T2DM  PHYSICAL EXAM: BP 110/70 mmHg  Pulse 95  Temp(Src) 98.8 F (37.1 C) (Oral)  Ht 6\' 2"  (1.88 m)  Wt 246 lb (111.585 kg)  BMI 31.57 kg/m2 Gen: NAD, pleasant, cooperative HEENT: NCAT, PERRL, EOMI Heart: RRR no murmur Lungs: CTAB NWOB Neuro: grossly nonfocal. CN II-XII tested and intact. Neg romberg. Normal FNF testing bilat.  Diabetic foot exam: 2+ DP pulses bilat, decreased sensation with monofilament testing bilaterally. No lesions or significant calluses.   ASSESSMENT/PLAN:  DM (diabetes mellitus), type 2, uncontrolled Continues to titrate insulin. Due for A1c in beginning of July, so pt will f/u in 1 mo.  Cardiac: on statin, aspirin Renal: normal urine microalbumin/creat ratio in May 2016 Eye: pt with hx of glaucoma, endorsing occasional blurry vision. Visual acuity normal today. Strongly encouraged ophthalmology follow up. Gave information on the office he was referred to previously. Advised he consider paying out of pocket if that is necessary, asi it si imoprtant for him to be seen to maintain eyesight long term. Foot: decreased sensation on monofilament testing today. Counseled pt on importance of closely inspecting feet each day, and warning signs to watch for (cuts, infections, etc).  Immunizations: discuss at future visit  BPPV (benign paroxysmal positional vertigo) Suspect dizziness  related to BPPV. Will do trial of meclizine. If not improving with this medication, consider referral for vestibular rehab. Neuro exam nonfocal today.   FOLLOW UP: F/u in 1 mo for DM & dizziness  G[place]ada J. Pollie Meyer, MD [facility]

## 2014-09-18 DIAGNOSIS — H8110 Benign paroxysmal vertigo, unspecified ear: Secondary | ICD-10-CM | POA: Insufficient documentation

## 2014-09-18 NOTE — Assessment & Plan Note (Addendum)
Continues to titrate insulin. Due for A1c in beginning of July, so pt will f/u in 1 mo.  Cardiac: on statin, aspirin Renal: normal urine microalbumin/creat ratio in May 2016 Eye: pt with hx of glaucoma, endorsing occasional blurry vision. Visual acuity normal today. Strongly encouraged ophthalmology follow up. Gave information on the office he was referred to previously. Advised he consider paying out of pocket if that is necessary, asi it si imoprtant for him to be seen to maintain eyesight long term. Foot: decreased sensation on monofilament testing today. Counseled pt on importance of closely inspecting feet each day, and warning signs to watch for (cuts, infections, etc).  Immunizations: discuss at future visit

## 2014-09-18 NOTE — Assessment & Plan Note (Signed)
Suspect dizziness related to BPPV. Will do trial of meclizine. If not improving with this medication, consider referral for vestibular rehab. Neuro exam nonfocal today.

## 2014-10-11 ENCOUNTER — Ambulatory Visit: Payer: Self-pay | Admitting: Family Medicine

## 2014-10-14 ENCOUNTER — Ambulatory Visit (INDEPENDENT_AMBULATORY_CARE_PROVIDER_SITE_OTHER): Payer: Self-pay | Admitting: Family Medicine

## 2014-10-14 VITALS — BP 115/76 | HR 103 | Temp 98.2°F | Ht 74.0 in | Wt 248.3 lb

## 2014-10-14 DIAGNOSIS — E1142 Type 2 diabetes mellitus with diabetic polyneuropathy: Secondary | ICD-10-CM

## 2014-10-14 DIAGNOSIS — E1140 Type 2 diabetes mellitus with diabetic neuropathy, unspecified: Secondary | ICD-10-CM | POA: Insufficient documentation

## 2014-10-14 DIAGNOSIS — G4700 Insomnia, unspecified: Secondary | ICD-10-CM

## 2014-10-14 DIAGNOSIS — Z23 Encounter for immunization: Secondary | ICD-10-CM

## 2014-10-14 DIAGNOSIS — IMO0002 Reserved for concepts with insufficient information to code with codable children: Secondary | ICD-10-CM

## 2014-10-14 DIAGNOSIS — E1165 Type 2 diabetes mellitus with hyperglycemia: Secondary | ICD-10-CM

## 2014-10-14 LAB — POCT GLYCOSYLATED HEMOGLOBIN (HGB A1C): Hemoglobin A1C: 11.2

## 2014-10-14 MED ORDER — INSULIN GLARGINE 100 UNIT/ML ~~LOC~~ SOLN: 25.0000 [IU] | Freq: Every day | SUBCUTANEOUS | Status: DC

## 2014-10-14 MED ORDER — GABAPENTIN 100 MG PO CAPS: 100.0000 mg | ORAL_CAPSULE | Freq: Every day | ORAL | Status: DC

## 2014-10-14 MED ORDER — LISINOPRIL 5 MG PO TABS: 5.0000 mg | ORAL_TABLET | Freq: Every day | ORAL | Status: DC

## 2014-10-14 MED ORDER — INSULIN GLARGINE 100 UNIT/ML ~~LOC~~ SOLN: 20.0000 [IU] | Freq: Every day | SUBCUTANEOUS | Status: DC

## 2014-10-14 NOTE — Patient Instructions (Signed)
Dear Lawanna KobusAngel, Thank you for coming in to clinic today. It was good to see you!  1. For DM your A1c is improving down to 11.3 - Keep up exercising regimen - Use Lantus 25u nightly, go up as instructed, by 2 units every 3 days if above >200 in morning - Check blood sugar fasting in morning (before eating or drinking) then check again twice in afternoon / evening 2 hr after eating 2. Start Gabapentin 100 capsules, may take 1 for 3 nights, may be sedating, then increase to 2 at night, then 3 over span of 1 week. Alternatively may take 1 capsule 3 times a day if preferred. 3. Start Lisinopril 5mg  one tab daily  Insomnia - Remember no TV 30 min to 1 hour, avoid bright screens before, keep exercising, no caffeine, no late night snack or drink after 9PM  BP 115/76 mmHg  Pulse 103  Temp(Src) 98.2 F (36.8 C) (Oral)  Ht 6\' 2"  (1.88 m)  Wt 248 lb 4.8 oz (112.628 kg)  BMI 31.87 kg/m2  Please schedule a follow-up appointment with Dr. Jarvis NewcomerGrunz in 1 to 3 month to follow-up on Insomnia Diabetes  If you have any other questions or concerns, please feel free to call the clinic to contact me. You may also schedule an earlier appointment if necessary.  However, if your symptoms get significantly worse, please go to the Emergency Department to seek immediate medical attention.  Saralyn PilarAlexander Karamalegos, DO [facility]

## 2014-10-14 NOTE — Progress Notes (Signed)
   Subjective:    Patient ID: Micheal Neal, male    DOB: 03/13/1971, 44 y.o.   MRN: [id number]  HPI  CHRONIC DM, Type 2: Reports - feels like he is doing much better, now taking lantus and going to gym CBGs: Avg 200, Low 150, High 350. Checks CBGs 3 times daily (was taking variable intervals after eating, not fasting or >2 hr) Meds: Lantus 20u nightly, Novolog 15u TID WC, stopped Metformin 2 years ago due to GI intolerance Reports good compliance. Tolerating well w/o side-effects Currently not on ACE/ARB Lifestyle: Diet (DM diet) / Exercise (5x weekly, treadmill and weight lifting) - Recently referred to Ophthalmology, awaiting to schedule, due to co-pay cost. Admits occasional bilateral sharp / burning foot pains Denies hypoglycemia, polyuria, visual changes, numbness or tingling.  INSOMNIA: - Recently for past 3 months, with sleep onset difficulty, wakes up frequently during night and difficulty going back to sleep. No change in work (currently unemployed) or normal routine. Does admit to watching TV for a while before attempting to fall asleep. Eats late night snack. Denies any caffeine consumption.  - Not tried any OTC and no prior rx meds - Denies depression, anxiety  HM: - Due for TDap, PNA vax  I have reviewed and updated the following as appropriate: allergies and current medications  Social Hx: - Denies any toboaco, alcohol, drugs  Review of Systems  See above HPI    Objective:   Physical Exam  BP 115/76 mmHg  Pulse 103  Temp(Src) 98.2 F (36.8 C) (Oral)  Ht 6\' 2"  (1.88 m)  Wt 248 lb 4.8 oz (112.628 kg)  BMI 31.87 kg/m2  Gen - obese, well-appearing, comfortable, NAD HEENT - MMM Heart - RRR, no murmurs heard Lungs - CTAB, no wheezing, crackles, or rhonchi. Normal work of breathing. Ext - non-tender, no edema, peripheral pulses intact +2 b/l Skin - warm, dry, no rashes Neuro - awake, alert     Assessment & Plan:   See specific A&P problem list for  details.

## 2014-10-15 ENCOUNTER — Encounter: Payer: Self-pay | Admitting: Family Medicine

## 2014-10-15 DIAGNOSIS — G4700 Insomnia, unspecified: Secondary | ICD-10-CM | POA: Insufficient documentation

## 2014-10-15 NOTE — Assessment & Plan Note (Signed)
Recent worsening insomnia, mostly with sleep onset but also difficult returning to sleep. Likely with poor sleep hygiene per report. Otherwise no obvious psych dx, denies depression / anxiety. No substance use or caffeine.  Plan: 1. Counseled on appropriate sleep hygiene behaviors as first line therapy. Emphasis on no TV - 1 hr prior to going to bed. No late night snack (no food/drink after 9pm). Continue exercise. No caffeine 2. Gabapentin may help with sedation if taken QHS 3. May trial OTC sleep aids periodically, not every night. Additionally recommended trial on OTC Melatonin. 4. Future consider brief trial Ambien to reset circadian rhythm, alternatively further explore underlying etiology

## 2014-10-15 NOTE — Assessment & Plan Note (Addendum)
Gradual improving control - still remains poorly controlled. Seems to be incorrectly checking CBGs though (postprandial, not >2 hr and not fasting) Current HgbA1c 11.2 (7/15), last HgbA1c 13.8 (07/2014). Complications - peripheral neuropathy, blurry vision - Pending Ophthalmology eval - recently referred, unable to afford copay - DM foot exam repeated today, mild diminished sensation - S/p TDap, PNA vax 7/15  Plan:  1. Increase Lantus to 25u qhs - reviewed plan to titrate up (did not seem to increase from last visit), Novolog 15u TID WC. Advised on appropriate CBG checking. 2. Lifestyle Mods - improved DM diet (dec carbs, inc vegs), improve exercise now with gym. 3. Start Lisinopril 5mg  daily for renal protection - no HTN, stable SCr 0.87, last K 4.3 in April 4. Start Gabapentin for likely DM peripheral neuropathy 5. Encouraged Ophtho follow-up 6. RTC 1-3 months - recommend returning for BMET within 1-2 weeks starting Lisinopril

## 2014-10-15 NOTE — Assessment & Plan Note (Signed)
Consistent with decreased sensation on monofilament DM foot testing. Also symptoms of burning / sharp pains b/l feet.  Plan: 1. Start Gabapentin  capsules, advised qhs dosing titrate up to . May help with sedation and sleep as well. Alternatively may try  TID, eventually can titrate up if tolerating at follow-up visit. May need new rx with higher cap # if increasing dose

## 2014-10-17 ENCOUNTER — Telehealth: Payer: Self-pay | Admitting: Family Medicine

## 2014-10-17 NOTE — Addendum Note (Signed)
Addended by: Gilberto BetterSIMPSON, MICHELLE R on: 10/17/2014 08:51 AM   Modules accepted: Orders, SmartSet

## 2014-10-17 NOTE — Telephone Encounter (Signed)
See last OV 10/14/14 with myself for DM follow-up. See note for details with start of Lisinopril 5mg  daily, last K 4.3 (07/2014), given new start ACEi, I have ordered Future BMET. Called patient today to remind him that once he starts Lisinopril (has not received yet, through [facility] program) he is to call and [facility] get BMET lab drawn to re-check K 1 week after starting Lisinopril. He understood plan, follow-up as scheduled.  Saralyn PilarAlexander Karamalegos, DO [facility], PGY-3

## 2014-11-15 ENCOUNTER — Other Ambulatory Visit: Payer: Self-pay | Admitting: *Deleted

## 2014-11-16 MED ORDER — INSULIN ASPART 100 UNIT/ML ~~LOC~~ SOLN: 0.0000 [IU] | Freq: Three times a day (TID) | SUBCUTANEOUS | Status: DC

## 2015-03-03 ENCOUNTER — Other Ambulatory Visit: Payer: Self-pay | Admitting: *Deleted

## 2015-03-06 MED ORDER — MOMETASONE FUROATE 50 MCG/ACT NA SUSP: 2.0000 | Freq: Every day | NASAL | Status: DC

## 2015-03-08 ENCOUNTER — Other Ambulatory Visit: Payer: Self-pay | Admitting: *Deleted

## 2015-03-10 ENCOUNTER — Telehealth: Payer: Self-pay | Admitting: *Deleted

## 2015-03-10 MED ORDER — DESLORATADINE 5 MG PO TABS: 5.0000 mg | ORAL_TABLET | Freq: Every day | ORAL | Status: DC

## 2015-03-10 NOTE — Telephone Encounter (Signed)
Crystal from [facility] called to get clarification on Rx for Nasonex.  Verbal order given per approval on 03/06/2015.  Clovis PuMartin, Tamika L, RN

## 2015-03-10 NOTE — Telephone Encounter (Signed)
Crystal calling to speak with Tamika regarding a note left on the form for the patient. Please follow-up with Crystal from the Medical Assistance Program.

## 2015-06-12 ENCOUNTER — Other Ambulatory Visit: Payer: Self-pay | Admitting: *Deleted

## 2015-06-12 MED ORDER — MOMETASONE FUROATE 50 MCG/ACT NA SUSP: 2.0000 | Freq: Every day | NASAL | Status: DC

## 2015-08-10 ENCOUNTER — Other Ambulatory Visit: Payer: Self-pay | Admitting: *Deleted

## 2015-08-10 MED ORDER — INSULIN ASPART 100 UNIT/ML ~~LOC~~ SOLN: 0.0000 [IU] | Freq: Three times a day (TID) | SUBCUTANEOUS | Status: DC

## 2015-08-10 MED ORDER — INSULIN GLARGINE 100 UNIT/ML ~~LOC~~ SOLN: 25.0000 [IU] | Freq: Every day | SUBCUTANEOUS | Status: DC

## 2015-09-14 ENCOUNTER — Other Ambulatory Visit: Payer: Self-pay | Admitting: *Deleted

## 2015-09-18 MED ORDER — DESLORATADINE 5 MG PO TABS: 5.0000 mg | ORAL_TABLET | Freq: Every day | ORAL | Status: DC

## 2015-09-18 MED ORDER — MOMETASONE FUROATE 50 MCG/ACT NA SUSP: 2.0000 | Freq: Every day | NASAL | Status: DC

## 2016-05-29 ENCOUNTER — Ambulatory Visit (INDEPENDENT_AMBULATORY_CARE_PROVIDER_SITE_OTHER): Payer: Self-pay | Admitting: Student

## 2016-05-29 ENCOUNTER — Encounter: Payer: Self-pay | Admitting: Student

## 2016-05-29 VITALS — BP 128/86 | HR 102 | Temp 98.0°F | Ht 74.0 in | Wt 244.8 lb

## 2016-05-29 DIAGNOSIS — E1165 Type 2 diabetes mellitus with hyperglycemia: Secondary | ICD-10-CM

## 2016-05-29 DIAGNOSIS — H539 Unspecified visual disturbance: Secondary | ICD-10-CM | POA: Insufficient documentation

## 2016-05-29 DIAGNOSIS — E118 Type 2 diabetes mellitus with unspecified complications: Secondary | ICD-10-CM

## 2016-05-29 LAB — POCT GLYCOSYLATED HEMOGLOBIN (HGB A1C): Hemoglobin A1C: 11.8

## 2016-05-29 MED ORDER — INSULIN GLARGINE 100 UNIT/ML ~~LOC~~ SOLN: 25.0000 [IU] | Freq: Every day | SUBCUTANEOUS | 5 refills | Status: DC

## 2016-05-29 MED ORDER — INSULIN ASPART 100 UNIT/ML ~~LOC~~ SOLN: 6.0000 [IU] | Freq: Three times a day (TID) | SUBCUTANEOUS | 3 refills | Status: DC

## 2016-05-29 MED ORDER — METFORMIN HCL 500 MG PO TABS: 500.0000 mg | ORAL_TABLET | Freq: Two times a day (BID) | ORAL | 0 refills | Status: DC

## 2016-05-29 NOTE — Patient Instructions (Addendum)
It was great seeing you today! We have addressed the following issues today 1. Diabetes: A1c is 11.8. Your goal A1c is less than 7.0. His symptoms are concerning for complication from your diabetes. I have ordered a referral to an eye doctor. Someone will get in touch with you on this in the next couple of weeks. 1. Check your blood glucose 4 times a day (15 minutes before meals and at bedtime) 2-3 days a week and write down the numbers on the book we gave you 2. Inject 25 units of Lantus before bedtime (at 10 PM) 3. Inject 6 units of NovoLog 3 times a day (15 minutes before meals) 4. I have also added metformin. Take 1 tablet a day. 5. Eat regular meals (breakfast, lunch and dinner) around-the-clock. You may snack as needed 6. Check your blood glucose if you have symptoms of low blood sugar. See below for more on this 7. Read below about lifestyle changes including diet and exercise.   If we did any lab work today, and the results require attention, either me or my nurse will get in touch with you. If everything is normal, you will get a letter in mail and a message via . If you don't hear from [place] in two weeks, please give [place] a call. Otherwise, we look forward to seeing you again at your next visit. If you have any questions or concerns before then, please call the clinic at [phone].  Please bring all your medications to every doctors visit  Sign up for My Chart to have easy access to your labs results, and communication with your Primary care physician.    Please check-out at the front desk before leaving the clinic.    Take Care,   Dr. Cyndia Skeeters   Hypoglycemia  Hypoglycemia is when the sugar (glucose) level in the blood is too low. Symptoms of low blood sugar may include:  Feeling:  Hungry.  Worried or nervous (anxious).  Sweaty and clammy.  Confused.  Dizzy.  Sleepy.  Sick to your stomach (nauseous).  Having:  A fast heartbeat.  A headache.  A change in your  vision.  Jerky movements that you cannot control (seizure).  Nightmares.  Tingling or no feeling (numbness) around the mouth, lips, or tongue.  Having trouble with:  Talking.  Paying attention (concentrating).  Moving (coordination).  Sleeping.  Shaking.  Passing out (fainting).  Getting upset easily (irritability). Low blood sugar can happen to people who have diabetes and people who do not have diabetes. Low blood sugar can happen quickly, and it can be an emergency. Treating Low Blood Sugar  Low blood sugar is often treated by eating or drinking something sugary right away. If you can think clearly and swallow safely, follow the 15:15 rule:  Take 15 grams of a fast-acting carb (carbohydrate). Some fast-acting carbs are:  1 tube of glucose gel.  3 sugar tablets (glucose pills).  6-8 pieces of hard candy.  4 oz (120 mL) of fruit juice.  4 oz (120 mL) of regular (not diet) soda.  Check your blood sugar 15 minutes after you take the carb.  If your blood sugar is still at or below 70 mg/dL (3.9 mmol/L), take 15 grams of a carb again.  If your blood sugar does not go above 70 mg/dL (3.9 mmol/L) after 3 tries, get help right away.  After your blood sugar goes back to normal, eat a meal or a snack within 1 hour. Treating Very Low  Blood Sugar  If your blood sugar is at or below 54 mg/dL (3 mmol/L), you have very low blood sugar (severe hypoglycemia). This is an emergency. Do not wait to see if the symptoms will go away. Get medical help right away. Call your local emergency services (911 in the [place] Do not drive yourself to the hospital. If you have very low blood sugar and you cannot eat or drink, you may need a glucagon shot (injection). A family member or friend should learn how to check your blood sugar and how to give you a glucagon shot. Ask your doctor if you need to have a glucagon shot kit at home. Follow these instructions at home: General instructions    Avoid any diets that cause you to not eat enough food. Talk with your doctor before you start any new diet.  Take over-the-counter and prescription medicines only as told by your doctor.  Limit alcohol to no more than 1 drink per day for nonpregnant women and 2 drinks per day for men. One drink equals 12 oz of beer, 5 oz of wine, or 1 oz of hard liquor.  Keep all follow-up visits as told by your doctor. This is important. If You Have Diabetes:    Make sure you know the symptoms of low blood sugar.  Always keep a source of sugar with you, such as:  Sugar.  Sugar tablets.  Glucose gel.  Fruit juice.  Regular soda (not diet soda).  Milk.  Hard candy.  Honey.  Take your medicines as told.  Follow your exercise and meal plan.  Eat on time. Do not skip meals.  Follow your sick day plan when you cannot eat or drink normally. Make this plan ahead of time with your doctor.  Check your blood sugar as often as told by your doctor. Always check before and after exercise.  Share your diabetes care plan with:  Your work or school.  People you live with.  Check your pee (urine) for ketones:  When you are sick.  As told by your doctor.  Carry a card or wear jewelry that says you have diabetes. If You Have Low Blood Sugar From Other Causes:    Check your blood sugar as often as told by your doctor.  Follow instructions from your doctor about what you cannot eat or drink. Contact a doctor if:  You have trouble keeping your blood sugar in your target range.  You have low blood sugar often. Get help right away if:  You still have symptoms after you eat or drink something sugary.  Your blood sugar is at or below 54 mg/dL (3 mmol/L).  You have jerky movements that you cannot control.  You pass out. These symptoms may be an emergency. Do not wait to see if the symptoms will go away. Get medical help right away. Call your local emergency services (911 in the  [place] Do not drive yourself to the hospital. This information is not intended to replace advice given to you by your health care provider. Make sure you discuss any questions you have with your health care provider. Document Released: 06/12/2009 Document Revised: 08/24/2015 Document Reviewed: 04/21/2015 Elsevier Interactive Patient Education  2017 [place] [employer]    Choose healthier foods such as 100% whole grains, vegetables, fruits, beans, nut seeds, olive oil, most vegetable oils, fat-free dietary, wild game and fish.   Avoid sweet tea, other sweetened beverages, soda, fruit juice, cold cereal and milk and  trans fat.   Eat at least 3 meals and 1-2 snacks per day.  Aim for no more than 5 hours between eating.  Eat breakfast within one hour of getting up.    Exercise at least 150 minutes per week, including weight resistance exercises 3 or 4 times per week.   Try to lose at least 7-10% of your current body weight.

## 2016-05-29 NOTE — Assessment & Plan Note (Signed)
Poorly controlled. A1c 11.8 today Now with microvascular complication..Hasn't followed up in about 2 years. He was under the impression that he didn't need a follow-up as long as he uses his insulin and exercises. He took himself off NovoLog once he started going to gym.   1. Check your blood glucose 4 times a day (15 minutes before meals and at bedtime) 2-3 days a week and write down the numbers on the book we gave you 2. Inject 25 units of Lantus before bedtime (at 10 PM) 3. Inject 6 units of NovoLog 3 times a day (15 minutes before meals) 4. We have also added metformin 500 mg. Take 1 tablet a day. 5. Eat regular meals (breakfast, lunch and dinner) around-the-clock. You may snack as needed 6. Discussed about hypoglycemia symptoms and management symptoms and managment 7. Lifestyle changes including diet and exercise. Gave handout on these.  Ordered referrals to ophthalmology BMP, lipid panel and urine microalbuminuria Follow-up in 2 weeks

## 2016-05-29 NOTE — Progress Notes (Signed)
Subjective:    Micheal Neal is a 46 y.o. old male here For follow-up on his diabetes. He also like to discuss about vision change and eye pain  HPI Vision change and eye pain: this going on for a year. No recent changes. Pain mostly in the morning. He thinks pain is due to lack of sleep. He reports feeling well when he had good sleep. He doesn't have pain right now. He denies photophobia. His wife says his eyes get red at times. He reports difficulty focusing. Never been to eye doctor. Denies eye discharge or swelling.   Diabetes: takes lantus 20 units at 10 pm. Reports good compliance with his Lantus. However, he stopped taking novolog about 8 months ago when he starting going to gym. Tried metformin in the past but couldn't tolerate due to stomach upset. Checks his blood glucose at 8 pm after dinner. Blood glucose usually in 200's but in 400's recently. Reports polydipsia & polyuria. Reports waking up about 6 times at night for urination. Reports vision changes as above. Also numbness in his legs. Reports problem with attaining erection. Denies fever or chills.   PMH/Problem List: has DM (diabetes mellitus), type 2, uncontrolled (HCC); Allergic rhinitis; BPPV (benign paroxysmal positional vertigo); Diabetic neuropathy (HCC); Insomnia; and Vision changes on his problem list.   has a past medical history of Diabetes mellitus without complication (HCC).  FH:  No family history on file.  SH Social History  Substance Use Topics  . Smoking status: Never Smoker  . Smokeless tobacco: Never Used  . Alcohol use No    Review of Systems Review of systems negative except for pertinent positives and negatives in history of present illness above.     Objective:     Vitals:   05/29/16 1609  BP: 128/86  Pulse: (!) 102  Temp: 98 F (36.7 C)  TempSrc: Oral  SpO2: 99%  Weight: 244 lb 12.8 oz (111 kg)  Height: 6\' 2"  (1.88 m)    Physical Exam GEN: appears well, no apparent distress. Head:  normocephalic and atraumatic  Eyes: conjunctiva without injection, sclera anicteric, PERRL, no eye discharge or eyelid swelling HEM: negative for cervical or periauricular lymphadenopathies CVS: HR 90/min, RR, nl S1&S2, no murmurs, no edema,  1+ DP & PT pulses bilaterally, cap refills < 2 secs RESP: speaks in full sentence, no IWOB, no [place] breathing, CTAB GI: BS present & normal, soft, NTND GU: no suprapubic or CVA tenderness MSK: no focal tenderness or notable swelling. A 9 point monofilament exam was in normal limits.  SKIN: no apparent skin lesion in his feet NEURO: alert and oiented appropriately, no gross defecits  PSYCH: euthymic mood with congruent affect    Assessment and Plan:  DM (diabetes mellitus), type 2, uncontrolled Poorly controlled. A1c 11.8 today Now with microvascular complication..Hasn't followed up in about 2 years. He was under the impression that he didn't need a follow-up as long as he uses his insulin and exercises. He took himself off NovoLog once he started going to gym.   1. Check your blood glucose 4 times a day (15 minutes before meals and at bedtime) 2-3 days a week and write down the numbers on the book we gave you 2. Inject 25 units of Lantus before bedtime (at 10 PM) 3. Inject 6 units of NovoLog 3 times a day (15 minutes before meals) 4. We have also added metformin 500 mg. Take 1 tablet a day. 5. Eat regular meals (breakfast, lunch and dinner) around-the-clock.  You may snack as needed 6. Discussed about hypoglycemia symptoms and management symptoms and managment 7. Lifestyle changes including diet and exercise. Gave handout on these.  Ordered referrals to ophthalmology BMP, lipid panel and urine microalbuminuria Follow-up in 2 weeks  Vision changes Vision change with eye pain. Vision change could be due to retinopathy from his poorly controlled diabetes. Wondering if the pain is due to open angle glaucoma. These are chronic for almost a year. Limited  exam within normal limits. I couldn't get a good funduscopic examination. -Ordered referrals to ophthalmology.  Orders Placed This Encounter  Procedures  . Lipid panel  . BASIC METABOLIC PANEL WITH GFR  . Microalbumin/Creatinine Ratio, Urine  . Ambulatory referral to Ophthalmology    Referral Priority:   Routine    Referral Type:   Consultation    Referral Reason:   Specialty Services Required    Requested Specialty:   Ophthalmology    Number of Visits Requested:   1  . HgB A1c    Return in about 2 weeks (around 06/12/2016) for DM.  Almon Herculesaye T Krystian Younglove, MD 05/29/16 Pager: [phone]

## 2016-05-29 NOTE — Assessment & Plan Note (Signed)
Vision change with eye pain. Vision change could be due to retinopathy from his poorly controlled diabetes. Wondering if the pain is due to open angle glaucoma. These are chronic for almost a year. Limited exam within normal limits. I couldn't get a good funduscopic examination. -Ordered referrals to ophthalmology.

## 2016-05-30 LAB — LIPID PANEL
CHOL/HDL RATIO: 9.2 ratio — AB (ref <5.0)
Cholesterol: 303 mg/dL — ABNORMAL HIGH (ref <200)
HDL: 33 mg/dL — ABNORMAL LOW (ref >40)
Triglycerides: 655 mg/dL — ABNORMAL HIGH (ref <150)

## 2016-05-30 LAB — BASIC METABOLIC PANEL WITH GFR
BUN: 16 mg/dL (ref 7–25)
CALCIUM: 9.6 mg/dL (ref 8.6–10.3)
CO2: 23 mmol/L (ref 20–31)
CREATININE: 1.32 mg/dL (ref 0.60–1.35)
Chloride: 95 mmol/L — ABNORMAL LOW (ref 98–110)
GFR, EST AFRICAN AMERICAN: 76 mL/min (ref >=60)
GFR, Est Non African American: 66 mL/min (ref >=60)
GLUCOSE: 327 mg/dL — AB (ref 65–99)
POTASSIUM: 4.3 mmol/L (ref 3.5–5.3)
Sodium: 133 mmol/L — ABNORMAL LOW (ref 135–146)

## 2016-05-30 LAB — MICROALBUMIN / CREATININE URINE RATIO
CREATININE, URINE: 159 mg/dL (ref 20–370)
Microalb Creat Ratio: 3 mcg/mg creat (ref <30)
Microalb, Ur: 0.5 mg/dL

## 2016-07-21 ENCOUNTER — Other Ambulatory Visit: Payer: Self-pay | Admitting: Student

## 2016-07-21 DIAGNOSIS — E1165 Type 2 diabetes mellitus with hyperglycemia: Secondary | ICD-10-CM

## 2016-07-21 DIAGNOSIS — E118 Type 2 diabetes mellitus with unspecified complications: Principal | ICD-10-CM

## 2016-07-21 DIAGNOSIS — IMO0002 Reserved for concepts with insufficient information to code with codable children: Secondary | ICD-10-CM

## 2016-08-28 ENCOUNTER — Telehealth: Payer: Self-pay | Admitting: Student

## 2016-08-28 NOTE — Telephone Encounter (Signed)
Phone rang for a while and when it stopped ringing there wasn't anymore sound. Couldn't leave message. - Mesha Guinyard

## 2017-04-08 ENCOUNTER — Other Ambulatory Visit: Payer: Self-pay

## 2017-04-08 ENCOUNTER — Emergency Department (HOSPITAL_COMMUNITY)
Admission: EM | Admit: 2017-04-08 | Discharge: 2017-04-08 | Disposition: A | Discharge status: Home / Self Care | Payer: Self-pay | Attending: Emergency Medicine | Admitting: Emergency Medicine

## 2017-04-08 ENCOUNTER — Encounter (HOSPITAL_COMMUNITY): Payer: Self-pay | Admitting: Emergency Medicine

## 2017-04-08 ENCOUNTER — Other Ambulatory Visit: Payer: Self-pay | Admitting: Surgery

## 2017-04-08 DIAGNOSIS — M545 Low back pain, unspecified: Secondary | ICD-10-CM

## 2017-04-08 DIAGNOSIS — E1165 Type 2 diabetes mellitus with hyperglycemia: Secondary | ICD-10-CM | POA: Insufficient documentation

## 2017-04-08 DIAGNOSIS — IMO0002 Reserved for concepts with insufficient information to code with codable children: Secondary | ICD-10-CM

## 2017-04-08 DIAGNOSIS — E118 Type 2 diabetes mellitus with unspecified complications: Secondary | ICD-10-CM

## 2017-04-08 DIAGNOSIS — Z7982 Long term (current) use of aspirin: Secondary | ICD-10-CM | POA: Insufficient documentation

## 2017-04-08 DIAGNOSIS — Z794 Long term (current) use of insulin: Secondary | ICD-10-CM | POA: Insufficient documentation

## 2017-04-08 LAB — I-STAT CHEM 8, ED
BUN: 15 mg/dL (ref 6–20)
CREATININE: 0.7 mg/dL (ref 0.61–1.24)
Calcium, Ion: 1.18 mmol/L (ref 1.15–1.40)
Chloride: 97 mmol/L — ABNORMAL LOW (ref 101–111)
Glucose, Bld: 338 mg/dL — ABNORMAL HIGH (ref 65–99)
HEMATOCRIT: 44 % (ref 39.0–52.0)
HEMOGLOBIN: 15 g/dL (ref 13.0–17.0)
Potassium: 4.4 mmol/L (ref 3.5–5.1)
Sodium: 135 mmol/L (ref 135–145)
TCO2: 25 mmol/L (ref 22–32)

## 2017-04-08 LAB — URINALYSIS, ROUTINE W REFLEX MICROSCOPIC
BILIRUBIN URINE: NEGATIVE
Glucose, UA: >=500 mg/dL — AB
Hgb urine dipstick: NEGATIVE
KETONES UR: NEGATIVE mg/dL
LEUKOCYTES UA: NEGATIVE
NITRITE: NEGATIVE
PH: 5 (ref 5.0–8.0)
Protein, ur: NEGATIVE mg/dL
Specific Gravity, Urine: 1.03 (ref 1.005–1.030)
Squamous Epithelial / LPF: NONE SEEN
WBC, UA: NONE SEEN WBC/hpf (ref 0–5)

## 2017-04-08 MED ORDER — METHOCARBAMOL 500 MG PO TABS: 500.0000 mg | ORAL_TABLET | Freq: Two times a day (BID) | ORAL | 0 refills | Status: DC | PRN

## 2017-04-08 MED ORDER — METFORMIN HCL 500 MG PO TABS: 1000.0000 mg | ORAL_TABLET | Freq: Two times a day (BID) | ORAL | 0 refills | Status: DC

## 2017-04-08 MED ORDER — ACETAMINOPHEN 500 MG PO TABS: 500.0000 mg | ORAL_TABLET | Freq: Four times a day (QID) | ORAL | 0 refills | Status: DC | PRN

## 2017-04-08 MED ORDER — ACETAMINOPHEN 325 MG PO TABS
650.0000 mg | ORAL_TABLET | Freq: Once | ORAL | Status: AC
  Administered 2017-04-08: 650 mg via ORAL
  Filled 2017-04-08: qty 2

## 2017-04-08 NOTE — ED Triage Notes (Signed)
Pt states for the last 3 days he has had right flank pain dark urine.

## 2017-04-08 NOTE — ED Provider Notes (Signed)
[facility]   CSN: [id number] Arrival date & time: 04/08/17  1203     History   Chief Complaint Chief Complaint  Patient presents with  . Flank Pain    HPI Micheal Neal is a 47 y.o. male.  Micheal Neal is a 47 y.o. Male with a history of diabetes who presents to the emergency department complaining of right low back pain that is worse with movement ongoing for the past 4 or 5 days.  Patient reports his pain is in his right low back and is worse with movement.  He reports if he holds still he does not have much pain.  He denies any known injury or trauma to his back.  He does report some urinary frequency that is been ongoing for a long period of time.  He is a diabetic and has been taking metformin, but not been taking insulin as he does not have insurance.  He is not followed by primary care currently.  He denies fevers, difficulty urinating, hematuria, dysuria, loss of bladder control, loss of bowel control, history of IV drug use, history of cancer, abdominal pain, nausea, vomiting, diarrhea, or back injury.    The history is provided by the patient and medical records. No language interpreter was used.  Flank Pain  Pertinent negatives include no chest pain, no abdominal pain, no headaches and no shortness of breath.    Past Medical History:  Diagnosis Date  . Diabetes mellitus without complication [facility](HCC)     Patient Active Problem List   Diagnosis Date Noted  . Vision changes 05/29/2016  . Insomnia 10/15/2014  . Diabetic neuropathy (HCC) 10/14/2014  . BPPV (benign paroxysmal positional vertigo) 09/18/2014  . Allergic rhinitis 07/01/2014  . DM (diabetes mellitus), type 2, uncontrolled (HCC) 02/20/2011    History reviewed. No pertinent surgical history.     Home Medications    Prior to Admission medications   Medication Sig Start Date End Date Taking? Authorizing Provider  acetaminophen (TYLENOL) 500 MG tablet  Take 1 tablet (500 mg total) by mouth every 6 (six) hours as needed. 04/08/17   Everlene Farrieransie, Jayln Branscom, PA-C  aspirin EC 81 MG tablet Take 81 mg by mouth daily.    [provider]  desloratadine (CLARINEX) 5 MG tablet Take 1 tablet (5 mg total) by mouth daily. 09/18/15   Tyrone NineGrunz, Ryan B, MD  gabapentin (NEURONTIN) 100 MG capsule Take 1 capsule (100 mg total) by mouth at bedtime. Increase up to 3 capsules. 10/14/14   Karamalegos, Netta NeatAlexander J, DO  insulin aspart (NOVOLOG) 100 UNIT/ML injection Inject 6 Units into the skin 3 (three) times daily with meals. 05/29/16   Almon HerculesGonfa, Taye T, MD  insulin glargine (LANTUS) 100 UNIT/ML injection Inject 0.25 mLs (25 Units total) into the skin at bedtime. 05/29/16   Almon HerculesGonfa, Taye T, MD  lisinopril (PRINIVIL,ZESTRIL) 5 MG tablet Take 1 tablet (5 mg total) by mouth daily. 10/14/14   Karamalegos, Netta NeatAlexander J, DO  meclizine (ANTIVERT) 25 MG tablet Take 1 tablet (25 mg total) by mouth 3 (three) times daily as needed for dizziness. 09/12/14   Latrelle DodrillMcIntyre, Brittany J, MD  metFORMIN (GLUCOPHAGE) 500 MG tablet Take 2 tablets (1,000 mg total) by mouth 2 (two) times daily with a meal. 04/08/17   Everlene Farrieransie, Stefani Baik, PA-C  methocarbamol (ROBAXIN) 500 MG tablet Take 1 tablet (500 mg total) by mouth 2 (two) times daily as needed for muscle spasms. 04/08/17   Everlene Farrieransie, Shemar Plemmons, PA-C  mometasone (NASONEX)  50 MCG/ACT nasal spray Place 2 sprays into the nose daily. 09/18/15   Tyrone Nine, MD  rosuvastatin (CRESTOR) 10 MG tablet Take 1 tablet (10 mg total) by mouth daily. 08/17/14   Latrelle Dodrill, MD    Family History No family history on file.  Social History Social History   Tobacco Use  . Smoking status: Never Smoker  . Smokeless tobacco: Never Used  Substance Use Topics  . Alcohol use: No  . Drug use: Not on file     Allergies   Patient has no known allergies.   Review of Systems Review of Systems  Constitutional: Negative for chills and fever.  HENT: Negative for congestion and  sore throat.   Eyes: Negative for visual disturbance.  Respiratory: Negative for cough, shortness of breath and wheezing.   Cardiovascular: Negative for chest pain and palpitations.  Gastrointestinal: Negative for abdominal pain, diarrhea, nausea and vomiting.  Genitourinary: Positive for frequency. Negative for difficulty urinating, dysuria, flank pain, hematuria, testicular pain and urgency.  Musculoskeletal: Positive for back pain. Negative for gait problem and neck pain.  Skin: Negative for color change and rash.  Neurological: Negative for weakness, light-headedness, numbness and headaches.     Physical Exam Updated Vital Signs BP 112/87 (BP Location: Right Arm)   Pulse 92   Temp 98.5 F (36.9 C) (Oral)   Resp 18   SpO2 99%   Physical Exam  Constitutional: He appears well-developed and well-nourished. No distress.  Nontoxic-appearing.  HENT:  Head: Normocephalic and atraumatic.  Mouth/Throat: Oropharynx is clear and moist.  Eyes: Conjunctivae are normal. Pupils are equal, round, and reactive to light. Right eye exhibits no discharge. Left eye exhibits no discharge.  Neck: Neck supple.  Cardiovascular: Normal rate, regular rhythm, normal heart sounds and intact distal pulses. Exam reveals no gallop and no friction rub.  No murmur heard. Pulmonary/Chest: Effort normal and breath sounds normal. No respiratory distress. He has no wheezes. He has no rales.  Abdominal: Soft. Bowel sounds are normal. He exhibits no distension and no mass. There is no tenderness. There is no rebound and no guarding.  Abdomen is soft and nontender to palpation.  No CVA or flank tenderness.  Musculoskeletal: Normal range of motion. He exhibits tenderness. He exhibits no edema or deformity.  Tenderness to his right low back musculature.  No midline neck or back tenderness.  No overlying skin changes to his back.  Good strength his bilateral lower extremities.  No lower extremity edema or tenderness.   Normal gait.  Lymphadenopathy:    He has no cervical adenopathy.  Neurological: He is alert. He displays normal reflexes. No sensory deficit. He exhibits normal muscle tone. Coordination normal.  Bilateral patellar DTRs are intact.  Normal gait.  Skin: Skin is warm and dry. No rash noted. He is not diaphoretic. No erythema. No pallor.  Psychiatric: He has a normal mood and affect. His behavior is normal.  Nursing note and vitals reviewed.    ED Treatments / Results  Labs (all labs ordered are listed, but only abnormal results are displayed) Labs Reviewed  URINALYSIS, ROUTINE W REFLEX MICROSCOPIC - Abnormal; Notable for the following components:      Result Value   Color, Urine STRAW (*)    Glucose, UA >=500 (*)    Bacteria, UA RARE (*)    All other components within normal limits  I-STAT CHEM 8, ED - Abnormal; Notable for the following components:   Chloride 97 (*)  Glucose, Bld 338 (*)    All other components within normal limits    EKG  EKG Interpretation None       Radiology No results found.  Procedures Procedures (including critical care time)  Medications Ordered in ED Medications  acetaminophen (TYLENOL) tablet 650 mg (650 mg Oral Given 04/08/17 1540)     Initial Impression / Assessment and Plan / ED Course  I have reviewed the triage vital signs and the nursing notes.  Pertinent labs & imaging results that were available during my care of the patient were reviewed by me and considered in my medical decision making (see chart for details).    This is a 47 y.o. Male with a history of diabetes who presents to the emergency department complaining of right low back pain that is worse with movement ongoing for the past 4 or 5 days.  Patient reports his pain is in his right low back and is worse with movement.  He reports if he holds still he does not have much pain.  He denies any known injury or trauma to his back.  He does report some urinary frequency that is  been ongoing for a long period of time.  He is a diabetic and has been taking metformin, but not been taking insulin as he does not have insurance.  He is not followed by primary care currently.  On exam the patient is afebrile nontoxic-appearing.  His abdomen is soft and nontender to palpation.  He has some right low back muscular tenderness to palpation.  No midline neck or back tenderness.  No flank tenderness.  Urinalysis shows glucosuria.  No hemoglobin.  No evidence of infection.  Doubt kidney stone.  This appears to be musculoskeletal low back pain.  His blood glucose is 338 with a normal anion gap.  He is off of his insulin because he cannot afford to buy them.  I discussed the case with case management who got him follow-up with primary care.  Will increase his metformin dose to 1000 mg twice a day with food.  This will help with his blood glucose.  I encouraged diet and exercise to help with his blood sugars as well.  Tylenol and Robaxin for his back pain.  I discussed back exercises.  Return precautions discussed. I advised the patient to follow-up with their primary care provider this week. I advised the patient to return to the emergency department with new or worsening symptoms or new concerns. The patient verbalized understanding and agreement with plan.     Final Clinical Impressions(s) / ED Diagnoses   Final diagnoses:  Acute right-sided low back pain without sciatica  Type 2 diabetes mellitus with hyperglycemia, unspecified whether long term insulin use [facility])    ED Discharge Orders        Ordered    metFORMIN (GLUCOPHAGE) 500 MG tablet  2 times daily with meals     04/08/17 1705    methocarbamol (ROBAXIN) 500 MG tablet  2 times daily PRN     04/08/17 1706    acetaminophen (TYLENOL) 500 MG tablet  Every 6 hours PRN     04/08/17 1706       Everlene Farrier, PA-C 04/08/17 1709    Margarita Grizzle, MD 04/09/17 1557

## 2017-04-18 ENCOUNTER — Encounter: Payer: Self-pay | Admitting: Family Medicine

## 2017-04-18 ENCOUNTER — Ambulatory Visit (INDEPENDENT_AMBULATORY_CARE_PROVIDER_SITE_OTHER): Payer: Self-pay | Admitting: Family Medicine

## 2017-04-18 VITALS — BP 121/75 | HR 88 | Temp 98.1°F | Resp 16 | Ht 74.0 in | Wt 262.0 lb

## 2017-04-18 DIAGNOSIS — E785 Hyperlipidemia, unspecified: Secondary | ICD-10-CM

## 2017-04-18 DIAGNOSIS — E1142 Type 2 diabetes mellitus with diabetic polyneuropathy: Secondary | ICD-10-CM

## 2017-04-18 DIAGNOSIS — E1165 Type 2 diabetes mellitus with hyperglycemia: Secondary | ICD-10-CM

## 2017-04-18 DIAGNOSIS — Z114 Encounter for screening for human immunodeficiency virus [HIV]: Secondary | ICD-10-CM

## 2017-04-18 LAB — POCT URINALYSIS DIP (DEVICE)
Bilirubin Urine: NEGATIVE
Glucose, UA: 500 mg/dL — AB
HGB URINE DIPSTICK: NEGATIVE
Ketones, ur: NEGATIVE mg/dL
Leukocytes, UA: NEGATIVE
Nitrite: NEGATIVE
PH: 5.5 (ref 5.0–8.0)
PROTEIN: NEGATIVE mg/dL
Specific Gravity, Urine: 1.02 (ref 1.005–1.030)
UROBILINOGEN UA: 0.2 mg/dL (ref 0.0–1.0)

## 2017-04-18 LAB — GLUCOSE, CAPILLARY: GLUCOSE-CAPILLARY: 216 mg/dL — AB (ref 65–99)

## 2017-04-18 LAB — POCT GLYCOSYLATED HEMOGLOBIN (HGB A1C): HEMOGLOBIN A1C: 11

## 2017-04-18 MED ORDER — INSULIN ASPART 100 UNIT/ML FLEXPEN: 8.0000 [IU] | PEN_INJECTOR | Freq: Three times a day (TID) | SUBCUTANEOUS | 11 refills | Status: DC

## 2017-04-18 MED ORDER — GABAPENTIN 300 MG PO CAPS: 300.0000 mg | ORAL_CAPSULE | Freq: Every day | ORAL | 5 refills | Status: DC

## 2017-04-18 MED ORDER — "INSULIN SYRINGE-NEEDLE U-100 31G X 5/16"" 0.5 ML MISC": 1.0000 | Freq: Three times a day (TID) | 11 refills | Status: DC

## 2017-04-18 MED ORDER — METFORMIN HCL 500 MG PO TABS: 500.0000 mg | ORAL_TABLET | Freq: Two times a day (BID) | ORAL | 5 refills | Status: DC

## 2017-04-18 MED ORDER — LISINOPRIL 2.5 MG PO TABS
2.5000 mg | ORAL_TABLET | Freq: Every day | ORAL | 5 refills | Status: DC
Start: 2017-04-18 — End: 2018-01-23

## 2017-04-18 MED ORDER — ASPIRIN EC 81 MG PO TBEC: 81.0000 mg | DELAYED_RELEASE_TABLET | Freq: Every day | ORAL | 11 refills | Status: DC

## 2017-04-18 MED ORDER — INSULIN GLARGINE 100 UNIT/ML SOLOSTAR PEN: 35.0000 [IU] | PEN_INJECTOR | Freq: Every day | SUBCUTANEOUS | 11 refills | Status: DC

## 2017-04-18 MED FILL — GABAPENTIN 300 MG CAPSULE: 300 | 30 days supply | Qty: 30 | Fill #0

## 2017-04-18 MED FILL — ?METFORMIN HCL 500MG TABLET: 500 | 30 days supply | Qty: 60 | Fill #0

## 2017-04-18 MED FILL — !LANTUS SOLOSTAR 100UNITS/M: 100 | 25 days supply | Qty: 9 | Fill #0

## 2017-04-18 MED FILL — !HUMALOG 100 UNITS/ML KWIKP: 100 | 25 days supply | Qty: 6 | Fill #0

## 2017-04-18 MED FILL — LISINOPRIL 2.5 MG TABLET: 2.5 | 30 days supply | Qty: 30 | Fill #0

## 2017-04-18 NOTE — Patient Instructions (Addendum)
You have uncontrolled type 2 diabetes mellitus.  Your A1c is 11, our goal is less than 7.  Will increase Lantus to 35 units at bedtime.  Will also increase meal coverage NovoLog 10 units.  We will continue metformin at 500 mg twice daily.  Recommend a carbohydrate modify low-fat diet divided over 5-6 tiny meals throughout the day.  Also increase her water intake to 6-8 glasses and increase overall fiber intake to assist with periodic constipation.  Also recommend a low impact cardiovascular exercise routine.  As payer source becomes available, will send for an ophthalmology exam. Preventing Type 2 Diabetes Mellitus Type 2 diabetes (type 2 diabetes mellitus) is a long-term (chronic) disease that affects blood sugar (glucose) levels. Normally, a hormone called insulin allows glucose to enter cells in the body. The cells use glucose for energy. In type 2 diabetes, one or both of these problems may be present:  The body does not make enough insulin.  The body does not respond properly to insulin that it makes (insulin resistance).  Insulin resistance or lack of insulin causes excess glucose to build up in the blood instead of going into cells. As a result, high blood glucose (hyperglycemia) develops, which can cause many complications. Being overweight or obese and having an inactive (sedentary) lifestyle can increase your risk for diabetes. Type 2 diabetes can be delayed or prevented by making certain nutrition and lifestyle changes. What nutrition changes can be made?  Eat healthy meals and snacks regularly. Keep a healthy snack with you for when you get hungry between meals, such as fruit or a handful of nuts.  Eat lean meats and proteins that are low in saturated fats, such as chicken, fish, egg whites, and beans. Avoid processed meats.  Eat plenty of fruits and vegetables and plenty of grains that have not been processed (whole grains). It is recommended that you eat: ? 1?2 cups of fruit every  day. ? 2?3 cups of vegetables every day. ? 6?8 oz of whole grains every day, such as oats, whole wheat, bulgur, brown rice, quinoa, and millet.  Eat low-fat dairy products, such as milk, yogurt, and cheese.  Eat foods that contain healthy fats, such as nuts, avocado, olive oil, and canola oil.  Drink water throughout the day. Avoid drinks that contain added sugar, such as soda or sweet tea.  Follow instructions from your health care provider about specific eating or drinking restrictions.  Control how much food you eat at a time (portion size). ? Check food labels to find out the serving sizes of foods. ? Use a kitchen scale to weigh amounts of foods.  Saute or steam food instead of frying it. Cook with water or broth instead of oils or butter.  Limit your intake of: ? Salt (sodium). Have no more than 1 tsp (2,400 mg) of sodium a day. If you have heart disease or high blood pressure, have less than ? tsp (1,500 mg) of sodium a day. ? Saturated fat. This is fat that is solid at room temperature, such as butter or fat on meat. What lifestyle changes can be made?  Activity  Do moderate-intensity physical activity for at least 30 minutes on at least 5 days of the week, or as much as told by your health care provider.  Ask your health care provider what activities are safe for you. A mix of physical activities may be best, such as walking, swimming, cycling, and strength training.  Try to add physical activity into  your day. For example: ? Park in spots that are farther away than usual, so that you walk more. For example, park in a far corner of the parking lot when you go to the office or the grocery store. ? Take a walk during your lunch break. ? Use stairs instead of elevators or escalators. Weight Loss  Lose weight as directed. Your health care provider can determine how much weight loss is best for you and can help you lose weight safely.  If you are overweight or obese, you  may be instructed to lose at least 5?7 % of your body weight. Alcohol and Tobacco   Limit alcohol intake to no more than 1 drink a day for nonpregnant women and 2 drinks a day for men. One drink equals 12 oz of beer, 5 oz of wine, or 1 oz of hard liquor.  Do not use any tobacco products, such as cigarettes, chewing tobacco, and e-cigarettes. If you need help quitting, ask your health care provider. Work With Your Health Care Provider  Have your blood glucose tested regularly, as told by your health care provider.  Discuss your risk factors and how you can reduce your risk for diabetes.  Get screening tests as told by your health care provider. You may have screening tests regularly, especially if you have certain risk factors for type 2 diabetes.  Make an appointment with a diet and nutrition specialist (registered dietitian). A registered dietitian can help you make a healthy eating plan and can help you understand portion sizes and food labels. Why are these changes important?  It is possible to prevent or delay type 2 diabetes and related health problems by making lifestyle and nutrition changes.  It can be difficult to recognize signs of type 2 diabetes. The best way to avoid possible damage to your body is to take actions to prevent the disease before you develop symptoms. What can happen if changes are not made?  Your blood glucose levels may keep increasing. Having high blood glucose for a long time is dangerous. Too much glucose in your blood can damage your blood vessels, heart, kidneys, nerves, and eyes.  You may develop prediabetes or type 2 diabetes. Type 2 diabetes can lead to many chronic health problems and complications, such as: ? Heart disease. ? Stroke. ? Blindness. ? Kidney disease. ? Depression. ? Poor circulation in the feet and legs, which could lead to surgical removal (amputation) in severe cases. Where to find support:  Ask your health care provider to  recommend a registered dietitian, diabetes educator, or weight loss program.  Look for local or online weight loss groups.  Join a gym, fitness club, or outdoor activity group, such as a walking club. Where to find more information: To learn more about diabetes and diabetes prevention, visit:  American Diabetes Association (ADA): www.diabetes.[employer] of Diabetes and Digestive and Kidney Diseases: ToyArticles.ca  To learn more about healthy eating, visit:  The U.S. Department of Agriculture Architect), Choose My Plate: http://yates.biz/  Office of Disease Prevention and Health Promotion (ODPHP), Dietary Guidelines: ListingMagazine.si  Summary  You can reduce your risk for type 2 diabetes by increasing your physical activity, eating healthy foods, and losing weight as directed.  Talk with your health care provider about your risk for type 2 diabetes. Ask about any blood tests or screening tests that you need to have. This information is not intended to replace advice given to you by your health care provider. Make  sure you discuss any questions you have with your health care provider. Document Released: 07/10/2015 Document Revised: 08/24/2015 Document Reviewed: 05/09/2015 Elsevier Interactive Patient Education  [employer].

## 2017-04-18 NOTE — Progress Notes (Signed)
Subjective:    Patient ID: Micheal Neal, male    DOB: 01/15/1971, 47 y.o.   MRN: [id number]  HPI Micheal Neal, a 47 year old male with a history of type 2 DMII, hyperlipidemia,  and obesity presents to establish care. Patient says that he was a patient of [facility] and has been lost to follow up over the past year due to insurance constraints. He says that he has been rationing antidiabetic medications over the past several months. Patient says that he is out of insulin, but has been taking Metformin consistently. Patient is overweight and does not exercise routinely. He also does not follow a carbohydrate modified diet. Body mass index is 33.64 kg/m. He endorses polyuria and polydipsia. He is also complaining of paresthesias to lower extremities. He denies dizziness, blurred vision, headache, dysuria, nausea vomiting, or diarrhea.  Past Medical History:  Diagnosis Date  . Diabetes mellitus without complication [facility])    Social History   Socioeconomic History  . Marital status: Married    Spouse name: Not on file  . Number of children: Not on file  . Years of education: Not on file  . Highest education level: Not on file  Social Needs  . Financial resource strain: Not on file  . Food insecurity - worry: Not on file  . Food insecurity - inability: Not on file  . Transportation needs - medical: Not on file  . Transportation needs - non-medical: Not on file  Occupational History  . Not on file  Tobacco Use  . Smoking status: Never Smoker  . Smokeless tobacco: Never Used  Substance and Sexual Activity  . Alcohol use: No  . Drug use: No  . Sexual activity: Not on file  Other Topics Concern  . Not on file  Social History Narrative  . Not on file   Immunization History  Administered Date(s) Administered  . Pneumococcal Conjugate-13 10/14/2014  . Tdap 10/14/2014   Review of Systems  Constitutional: Negative for fatigue and fever.  HENT: Negative.    Respiratory: Negative.  Negative for cough and shortness of breath.   Cardiovascular: Negative.  Negative for chest pain and palpitations.  Gastrointestinal: Negative.   Endocrine: Positive for polydipsia and polyuria. Negative for polyphagia.  Musculoskeletal: Negative.   Skin: Negative.   Allergic/Immunologic: Negative.  Negative for immunocompromised state.  Neurological: Negative.   Psychiatric/Behavioral: Negative.        Objective:   Physical Exam  Constitutional: He is oriented to person, place, and time. He appears well-developed and well-nourished.  HENT:  Head: Normocephalic and atraumatic.  Right Ear: External ear normal.  Left Ear: External ear normal.  Nose: Nose normal.  Mouth/Throat: Oropharynx is clear and moist.  Eyes: Pupils are equal, round, and reactive to light.  Neck: Normal range of motion. Neck supple.  Cardiovascular: Normal rate, regular rhythm, normal heart sounds and intact distal pulses.  Pulmonary/Chest: Effort normal and breath sounds normal.  Abdominal: Soft. Bowel sounds are normal.  Musculoskeletal: Normal range of motion.  Neurological: He is alert and oriented to person, place, and time. He has normal reflexes.         BP 121/75 (BP Location: Left Arm, Patient Position: Sitting, Cuff Size: Large)   Pulse 88   Temp 98.1 F (36.7 C) (Oral)   Resp 16   Ht 6\' 2"  (1.88 m)   Wt 262 lb (118.8 kg)   SpO2 100%   BMI 33.64 kg/m  Assessment & Plan:  1. Uncontrolled type 2 diabetes mellitus with hyperglycemia (HCC) Hemoglobin a1C is 11.0, which is above goal. Goal is < 7. Will start Lantus 35 units at bedtime, Novolog 8 units 3 times per day prior to meals, and will continue Metformin 500 mg BID.  Recommend that patient bring glucometer to scheduled follow up.  - HgB A1c - Glucose, capillary - Insulin Glargine (LANTUS SOLOSTAR) 100 UNIT/ML Solostar Pen; Inject 35 Units into the skin daily at 10 pm.  Dispense: 5 pen; Refill: 11 - insulin  aspart (NOVOLOG FLEXPEN) 100 UNIT/ML FlexPen; Inject 8 Units into the skin 3 (three) times daily with meals.  Dispense: 15 mL; Refill: 11 - Insulin Syringe-Needle U-100 (BD INSULIN SYRINGE ULTRAFINE) 31G X 5/16" 0.5 ML MISC; 1 each by Does not apply route 4 (four) times daily -  before meals and at bedtime.  Dispense: 100 each; Refill: 11 - metFORMIN (GLUCOPHAGE) 500 MG tablet; Take 1 tablet (500 mg total) by mouth 2 (two) times daily with a meal.  Dispense: 60 tablet; Refill: 5 - lisinopril (PRINIVIL,ZESTRIL) 2.5 MG tablet; Take 1 tablet (2.5 mg total) by mouth daily.  Dispense: 30 tablet; Refill: 5 - aspirin EC 81 MG tablet; Take 1 tablet (81 mg total) by mouth daily.  Dispense: 30 tablet; Refill: 11 - CMP and Liver; Future - Microalbumin/Creatinine Ratio, Urine - POCT urinalysis dip (device)  2. Diabetic polyneuropathy associated with type 2 diabetes mellitus (HCC) Will start a trial of gabapentin 300 mg at bedtime.   - gabapentin (NEURONTIN) 300 MG capsule; Take 1 capsule (300 mg total) by mouth at bedtime.  Dispense: 30 capsule; Refill: 5  3. Screening for HIV (human immunodeficiency virus)  - HIV antibody (with reflex); Future  4. Hyperlipidemia LDL goal <100 The 10-year ASCVD risk score Denman George(Goff DC Jr., et al., 2013) is: 10.8%   Values used to calculate the score:     Age: [age] years     Sex: Male     Is Non-Hispanic African American: No     Diabetic: Yes     Tobacco smoker: No     Systolic Blood Pressure: 121 mmHg     Is BP treated: No     HDL Cholesterol: 33 mg/dL     Total Cholesterol: 303 mg/dL - Lipid Panel; Future - CMP and Liver; Future   RTC: 1 week for fasting labs and 1 months for uncontrolled DMII  Nolon NationsLachina Moore Nur Krasinski  MSN, FNP-C Patient Care [facility] [address]  [place], [place] [address] [phone]

## 2017-04-19 LAB — MICROALBUMIN / CREATININE URINE RATIO
Creatinine, Urine: 69.4 mg/dL
Microalb/Creat Ratio: <4.3 mg/g creat (ref 0.0–30.0)

## 2017-04-25 ENCOUNTER — Other Ambulatory Visit: Payer: Medicaid Other

## 2017-04-25 DIAGNOSIS — Z114 Encounter for screening for human immunodeficiency virus [HIV]: Secondary | ICD-10-CM

## 2017-04-25 DIAGNOSIS — E785 Hyperlipidemia, unspecified: Secondary | ICD-10-CM

## 2017-04-25 DIAGNOSIS — E1165 Type 2 diabetes mellitus with hyperglycemia: Secondary | ICD-10-CM

## 2017-04-26 LAB — CMP AND LIVER
ALBUMIN: 4.4 g/dL (ref 3.5–5.5)
ALK PHOS: 91 IU/L (ref 39–117)
ALT: 26 IU/L (ref 0–44)
AST: 18 IU/L (ref 0–40)
BILIRUBIN, DIRECT: 0.11 mg/dL (ref 0.00–0.40)
BUN: 12 mg/dL (ref 6–24)
Bilirubin Total: 0.6 mg/dL (ref 0.0–1.2)
CO2: 24 mmol/L (ref 20–29)
CREATININE: 0.82 mg/dL (ref 0.76–1.27)
Calcium: 9.1 mg/dL (ref 8.7–10.2)
Chloride: 102 mmol/L (ref 96–106)
GFR calc Af Amer: 124 mL/min/{1.73_m2} (ref >59)
GFR calc non Af Amer: 108 mL/min/{1.73_m2} (ref >59)
Glucose: 189 mg/dL — ABNORMAL HIGH (ref 65–99)
POTASSIUM: 4.4 mmol/L (ref 3.5–5.2)
Sodium: 141 mmol/L (ref 134–144)
TOTAL PROTEIN: 7.7 g/dL (ref 6.0–8.5)

## 2017-04-26 LAB — LIPID PANEL
CHOLESTEROL TOTAL: 273 mg/dL — AB (ref 100–199)
Chol/HDL Ratio: 6.1 ratio — ABNORMAL HIGH (ref 0.0–5.0)
HDL: 45 mg/dL (ref >39)
LDL CALC: 191 mg/dL — AB (ref 0–99)
Triglycerides: 186 mg/dL — ABNORMAL HIGH (ref 0–149)
VLDL Cholesterol Cal: 37 mg/dL (ref 5–40)

## 2017-04-26 LAB — HIV ANTIBODY (ROUTINE TESTING W REFLEX): HIV SCREEN 4TH GENERATION: NONREACTIVE

## 2017-04-27 ENCOUNTER — Other Ambulatory Visit: Payer: Self-pay | Admitting: Family Medicine

## 2017-04-27 DIAGNOSIS — E785 Hyperlipidemia, unspecified: Secondary | ICD-10-CM

## 2017-04-27 MED ORDER — ROSUVASTATIN CALCIUM 20 MG PO TABS
20.0000 mg | ORAL_TABLET | Freq: Every day | ORAL | 1 refills | Status: DC
Start: 2017-04-27 — End: 2018-01-23

## 2017-04-27 NOTE — Progress Notes (Signed)
Meds ordered this encounter  Medications  . rosuvastatin (CRESTOR) 20 MG tablet    Sig: Take 1 tablet (20 mg total) by mouth daily.    Dispense:  90 tablet    Refill:  1    Nolon NationsLachina Moore Wendie Diskin  MSN, FNP-C Patient [facility] [address]  [place], [place] [address] [phone]

## 2017-04-29 ENCOUNTER — Telehealth: Payer: Self-pay

## 2017-04-29 NOTE — Telephone Encounter (Signed)
-----   Message from Massie MaroonLachina M Hollis, [place]NP sent at 04/27/2017  4:25 PM EST ----- Regarding: lab results Please inform patient that total cholesterol is elevated at 273, goal is less than 200.  Also, LDL cholesterol is elevated at 191, goal is less than 100.  Will increase Crestor to 20 mg every evening with dinner and continue aspirin 81 mg/day.  Also recommend a low-fat, low carbohydrate diet divided over 5-6 small meals as discussed during previous appointment.  Also start a low impact cardiovascular S exercise routine daily. Please follow-up in office as previously scheduled. Thanks

## 2017-04-29 NOTE — Telephone Encounter (Signed)
Called, no answer. Left message for patient to return call and left call back number. Thanks!  

## 2017-05-01 NOTE — Telephone Encounter (Signed)
Patient called back and I informed of cholesterol and LDL being elevated. Advised that we need to increase Crestor to 20mg  once daily with dinner. Advised to eat a low fat low carb diet over 5 to 6 small meals daily. Advised to keep taking 81mg  aspirin daily. Advised to start low impact cardio exercise routine daily and keep next scheduled appointment. Patient verbalized understanding. Thanks!

## 2017-05-01 NOTE — Telephone Encounter (Signed)
Called, no answer. Left a message for patient to return call. Thanks!  

## 2017-05-05 MED FILL — !LANTUS SOLOSTAR 100UNITS/M: 100 | 25 days supply | Qty: 9 | Fill #1

## 2017-05-05 MED FILL — !HUMALOG 100 UNITS/ML KWIKP: 100 | 25 days supply | Qty: 6 | Fill #1

## 2017-05-15 MED FILL — GABAPENTIN 300 MG CAPSULE: 300 | 30 days supply | Qty: 30 | Fill #1

## 2017-05-15 MED FILL — LISINOPRIL 2.5 MG TABLET: 2.5 | 30 days supply | Qty: 30 | Fill #1

## 2017-05-15 MED FILL — ?METFORMIN HCL 500MG TABLET: 500 | 30 days supply | Qty: 60 | Fill #1

## 2017-05-19 ENCOUNTER — Encounter: Payer: Self-pay | Admitting: Family Medicine

## 2017-05-19 ENCOUNTER — Ambulatory Visit (INDEPENDENT_AMBULATORY_CARE_PROVIDER_SITE_OTHER): Payer: Self-pay | Admitting: Family Medicine

## 2017-05-19 VITALS — BP 105/69 | HR 90 | Temp 98.4°F | Resp 16 | Ht 74.0 in | Wt 264.0 lb

## 2017-05-19 DIAGNOSIS — E1165 Type 2 diabetes mellitus with hyperglycemia: Secondary | ICD-10-CM

## 2017-05-19 DIAGNOSIS — E669 Obesity, unspecified: Secondary | ICD-10-CM

## 2017-05-19 LAB — POCT URINALYSIS DIP (DEVICE)
Bilirubin Urine: NEGATIVE
GLUCOSE, UA: NEGATIVE mg/dL
HGB URINE DIPSTICK: NEGATIVE
Ketones, ur: NEGATIVE mg/dL
Leukocytes, UA: NEGATIVE
NITRITE: NEGATIVE
PROTEIN: NEGATIVE mg/dL
Specific Gravity, Urine: 1.02 (ref 1.005–1.030)
UROBILINOGEN UA: 0.2 mg/dL (ref 0.0–1.0)
pH: 5.5 (ref 5.0–8.0)

## 2017-05-19 LAB — POCT GLYCOSYLATED HEMOGLOBIN (HGB A1C): HEMOGLOBIN A1C: 8.7

## 2017-05-19 LAB — GLUCOSE, CAPILLARY: Glucose-Capillary: 124 mg/dL — ABNORMAL HIGH (ref 65–99)

## 2017-05-19 MED ORDER — INSULIN ASPART 100 UNIT/ML FLEXPEN: 4.0000 [IU] | PEN_INJECTOR | Freq: Three times a day (TID) | SUBCUTANEOUS | 11 refills | Status: DC

## 2017-05-19 MED ORDER — INSULIN LISPRO 100 UNIT/ML (KWIKPEN): 4.0000 [IU] | PEN_INJECTOR | Freq: Three times a day (TID) | SUBCUTANEOUS | 11 refills | Status: DC

## 2017-05-19 MED ORDER — INSULIN GLARGINE 100 UNIT/ML SOLOSTAR PEN: 25.0000 [IU] | PEN_INJECTOR | Freq: Every day | SUBCUTANEOUS | 11 refills | Status: DC

## 2017-05-19 NOTE — Progress Notes (Signed)
Chief Complaint  Patient presents with  . Diabetes    Subjective:    Patient ID: Micheal Neal, male    DOB: 06/14/1970, 47 y.o.   MRN: [id number]  HPI Micheal Neal, a 47 year old male with a history of type 2 DMII, hyperlipidemia,  and obesity presents for a follow up of chronic conditions. Patient has been exercising consistently and following a carbohydrate modified diet. Body mass index is 33.9 kg/m.  Blood sugars have ranged between 80-130 upon awakening. Patient denies episodes of hypoglycemia.  He also denies headache, chest pain, SOB, nausea, vomiting, diarrhea, dysuria, polyuria, polydipsia, or polyphagia. .  Past Medical History:  Diagnosis Date  . Diabetes mellitus without complication  [facility](HCC)    Social History   Socioeconomic History  . Marital status: Married    Spouse name: Not on file  . Number of children: Not on file  . Years of education: Not on file  . Highest education level: Not on file  Social Needs  . Financial resource strain: Not on file  . Food insecurity - worry: Not on file  . Food insecurity - inability: Not on file  . Transportation needs - medical: Not on file  . Transportation needs - non-medical: Not on file  Occupational History  . Not on file  Tobacco Use  . Smoking status: Never Smoker  . Smokeless tobacco: Never Used  Substance and Sexual Activity  . Alcohol use: No  . Drug use: No  . Sexual activity: Not on file  Other Topics Concern  . Not on file  Social History Narrative  . Not on file   Immunization History  Administered Date(s) Administered  . Pneumococcal Conjugate-13 10/14/2014  . Tdap 10/14/2014   Review of Systems  Constitutional: Negative for fatigue and fever.  HENT: Negative.   Respiratory: Negative.  Negative for cough and shortness of breath.   Cardiovascular: Negative.  Negative for chest pain and palpitations.  Gastrointestinal: Negative.   Endocrine: Negative for polydipsia, polyphagia and polyuria.   Musculoskeletal: Negative.   Skin: Negative.   Allergic/Immunologic: Negative.  Negative for immunocompromised state.  Neurological: Negative.   Psychiatric/Behavioral: Negative.        Objective:   Physical Exam  Constitutional: He is oriented to person, place, and time. He appears well-developed and well-nourished.  HENT:  Head: Normocephalic and atraumatic.  Right Ear: External ear normal.  Left Ear: External ear normal.  Nose: Nose normal.  Mouth/Throat: Oropharynx is clear and moist.  Eyes: Pupils are equal, round, and reactive to light.  Neck: Normal range of motion. Neck supple.  Cardiovascular: Normal rate, regular rhythm, normal heart sounds and intact distal pulses.  Pulmonary/Chest: Effort normal and breath sounds normal.  Abdominal: Soft. Bowel sounds are normal.  Musculoskeletal: Normal range of motion.  Neurological: He is alert and oriented to person, place, and time. He has normal reflexes.         BP 105/69 (BP Location: Right Arm, Patient Position: Sitting, Cuff Size: Large)   Pulse 90   Temp 98.4 F (36.9 C) (Oral)   Resp 16   Ht 6\' 2"  (1.88 m)   Wt 264 lb (119.7 kg)   SpO2 100%   BMI 33.90 kg/m  Assessment & Plan:  1. Uncontrolled type 2 diabetes mellitus with hyperglycemia (HCC) Hemoglobin a1C has decreased from 11 to 8.7. Will decrease Lantus to 25 units at bedtime.  Will continue Humolog 4 units prior to meals.  Continue carbohydrate modified diet as discussed.  -  HgB A1c - Insulin Glargine (LANTUS SOLOSTAR) 100 UNIT/ML Solostar Pen; Inject 25 Units into the skin daily at 10 pm.  Dispense: 5 pen; Refill: 11 - insulin lispro (HUMALOG KWIKPEN) 100 UNIT/ML KiwkPen; Inject 0.04 mLs (4 Units total) into the skin 3 (three) times daily.  Dispense: 15 mL; Refill: 11  2. Obesity (BMI 30-39.9) Recommend a lowfat, low carbohydrate diet divided over 5-6 small meals, increase water intake to 6-8 glasses, and 150 minutes per week of cardiovascular exercise.     RTC: 3 months for DMII  Nolon Nations  MSN, FNP-C Patient Care [facility] [address] [place], [place] [address] [phone]

## 2017-05-19 NOTE — Patient Instructions (Addendum)
Your hemoglobin A1c has decreased from 11-8.7.  Will decrease Lantus to 25 units at bedtime.  Continue low carbohydrate diet divided over 5-6 small meals throughout the day.  Your goal hemoglobin A1c is less than 7.  Will also continue NovoLog 4 units 3 times per day prior to meals and metformin 500 mg twice daily.  Carbohydrate Counting for Diabetes Mellitus, Adult Carbohydrate counting is a method for keeping track of how many carbohydrates you eat. Eating carbohydrates naturally increases the amount of sugar (glucose) in the blood. Counting how many carbohydrates you eat helps keep your blood glucose within normal limits, which helps you manage your diabetes (diabetes mellitus). It is important to know how many carbohydrates you can safely have in each meal. This is different for every person. A diet and nutrition specialist (registered dietitian) can help you make a meal plan and calculate how many carbohydrates you should have at each meal and snack. Carbohydrates are found in the following foods:  Grains, such as breads and cereals.  Dried beans and soy products.  Starchy vegetables, such as potatoes, peas, and corn.  Fruit and fruit juices.  Milk and yogurt.  Sweets and snack foods, such as cake, cookies, candy, chips, and soft drinks.  How do I count carbohydrates? There are two ways to count carbohydrates in food. You can use either of the methods or a combination of both. Reading "Nutrition Facts" on packaged food The "Nutrition Facts" list is included on the labels of almost all packaged foods and beverages in the [place] It includes:  The serving size.  Information about nutrients in each serving, including the grams (g) of carbohydrate per serving.  To use the "Nutrition Facts":  Decide how many servings you will have.  Multiply the number of servings by the number of carbohydrates per serving.  The resulting number is the total amount of carbohydrates that you will be  having.  Learning standard serving sizes of other foods When you eat foods containing carbohydrates that are not packaged or do not include "Nutrition Facts" on the label, you need to measure the servings in order to count the amount of carbohydrates:  Measure the foods that you will eat with a food scale or measuring cup, if needed.  Decide how many standard-size servings you will eat.  Multiply the number of servings by 15. Most carbohydrate-rich foods have about 15 g of carbohydrates per serving. ? For example, if you eat 8 oz (170 g) of strawberries, you will have eaten 2 servings and 30 g of carbohydrates (2 servings x 15 g = 30 g).  For foods that have more than one food mixed, such as soups and casseroles, you must count the carbohydrates in each food that is included.  The following list contains standard serving sizes of common carbohydrate-rich foods. Each of these servings has about 15 g of carbohydrates:   hamburger bun or  English muffin.   oz (15 mL) syrup.   oz (14 g) jelly.  1 slice of bread.  1 six-inch tortilla.  3 oz (85 g) cooked rice or pasta.  4 oz (113 g) cooked dried beans.  4 oz (113 g) starchy vegetable, such as peas, corn, or potatoes.  4 oz (113 g) hot cereal.  4 oz (113 g) mashed potatoes or  of a large baked potato.  4 oz (113 g) canned or frozen fruit.  4 oz (120 mL) fruit juice.  4-6 crackers.  6 chicken nuggets.  6 oz (  170 g) unsweetened dry cereal.  6 oz (170 g) plain fat-free yogurt or yogurt sweetened with artificial sweeteners.  8 oz (240 mL) milk.  8 oz (170 g) fresh fruit or one small piece of fruit.  24 oz (680 g) popped popcorn.  Example of carbohydrate counting Sample meal  3 oz (85 g) chicken breast.  6 oz (170 g) brown rice.  4 oz (113 g) corn.  8 oz (240 mL) milk.  8 oz (170 g) strawberries with sugar-free whipped topping. Carbohydrate calculation 1. Identify the foods that contain  carbohydrates: ? Rice. ? Corn. ? Milk. ? Strawberries. 2. Calculate how many servings you have of each food: ? 2 servings rice. ? 1 serving corn. ? 1 serving milk. ? 1 serving strawberries. 3. Multiply each number of servings by 15 g: ? 2 servings rice x 15 g = 30 g. ? 1 serving corn x 15 g = 15 g. ? 1 serving milk x 15 g = 15 g. ? 1 serving strawberries x 15 g = 15 g. 4. Add together all of the amounts to find the total grams of carbohydrates eaten: ? 30 g + 15 g + 15 g + 15 g = 75 g of carbohydrates total. This information is not intended to replace advice given to you by your health care provider. Make sure you discuss any questions you have with your health care provider. Document Released: 03/18/2005 Document Revised: 10/06/2015 Document Reviewed: 08/30/2015 Elsevier Interactive Patient Education  [employer].

## 2017-05-24 DIAGNOSIS — E669 Obesity, unspecified: Secondary | ICD-10-CM | POA: Insufficient documentation

## 2017-06-04 MED FILL — $HUMALOG 100 UNITS/ML KWIKP: 100 | 25 days supply | Qty: 3 | Fill #0

## 2017-06-04 MED FILL — $LANTUS SOLOSTAR 100 UNITS/: 100 | 36 days supply | Qty: 9 | Fill #0

## 2017-06-04 MED FILL — ROSUVASTATIN CALCIUM 20 MG: 20 | 30 days supply | Qty: 30 | Fill #0

## 2017-06-16 MED FILL — LISINOPRIL 2.5 MG TABLET: 2.5 | 30 days supply | Qty: 30 | Fill #2

## 2017-06-16 MED FILL — GABAPENTIN 300 MG CAPSULE: 300 | 30 days supply | Qty: 30 | Fill #2

## 2017-07-10 MED FILL — ROSUVASTATIN CALCIUM 20 MG: 20 | 30 days supply | Qty: 30 | Fill #1

## 2017-07-10 MED FILL — $HUMALOG 100 UNITS/ML KWIKP: 100 | 25 days supply | Qty: 3 | Fill #1

## 2017-07-10 MED FILL — $LANTUS SOLOSTAR 100 UNITS/: 100 | 36 days supply | Qty: 9 | Fill #1

## 2017-08-27 ENCOUNTER — Ambulatory Visit (INDEPENDENT_AMBULATORY_CARE_PROVIDER_SITE_OTHER): Payer: Self-pay | Admitting: Family Medicine

## 2017-08-27 ENCOUNTER — Encounter: Payer: Self-pay | Admitting: Family Medicine

## 2017-08-27 VITALS — BP 105/72 | HR 99 | Temp 98.4°F | Resp 16 | Ht 74.0 in | Wt 264.0 lb

## 2017-08-27 DIAGNOSIS — E1165 Type 2 diabetes mellitus with hyperglycemia: Secondary | ICD-10-CM

## 2017-08-27 DIAGNOSIS — E669 Obesity, unspecified: Secondary | ICD-10-CM

## 2017-08-27 DIAGNOSIS — E1142 Type 2 diabetes mellitus with diabetic polyneuropathy: Secondary | ICD-10-CM

## 2017-08-27 LAB — POCT GLYCOSYLATED HEMOGLOBIN (HGB A1C): Hemoglobin A1C: 10.4 % — AB (ref 4.0–5.6)

## 2017-08-27 LAB — POCT URINALYSIS DIPSTICK
BILIRUBIN UA: NEGATIVE
Glucose, UA: POSITIVE — AB
Ketones, UA: NEGATIVE
Leukocytes, UA: NEGATIVE
Nitrite, UA: NEGATIVE
Protein, UA: NEGATIVE
RBC UA: NEGATIVE
Urobilinogen, UA: 0.2 E.U./dL
pH, UA: 5.5 (ref 5.0–8.0)

## 2017-08-27 LAB — GLUCOSE, POCT (MANUAL RESULT ENTRY): POC GLUCOSE: 252 mg/dL — AB (ref 70–99)

## 2017-08-27 MED ORDER — GABAPENTIN 300 MG PO CAPS
300.0000 mg | ORAL_CAPSULE | Freq: Three times a day (TID) | ORAL | 5 refills | Status: DC
Start: 2017-08-27 — End: 2018-01-23

## 2017-08-27 MED ORDER — INSULIN GLARGINE 100 UNIT/ML SOLOSTAR PEN: 30.0000 [IU] | PEN_INJECTOR | Freq: Every day | SUBCUTANEOUS | 11 refills | Status: DC

## 2017-08-27 MED ORDER — INSULIN LISPRO 100 UNIT/ML (KWIKPEN): 6.0000 [IU] | PEN_INJECTOR | Freq: Three times a day (TID) | SUBCUTANEOUS | 11 refills | Status: DC

## 2017-08-27 MED FILL — $HUMALOG 100 UNITS/ML KWIKP: 100 | 33 days supply | Qty: 6 | Fill #0

## 2017-08-27 MED FILL — $LANTUS SOLOSTAR 100 UNITS/: 100 | 30 days supply | Qty: 9 | Fill #0

## 2017-08-27 MED FILL — GABAPENTIN 300 MG CAPSULE: 300 | 30 days supply | Qty: 90 | Fill #0

## 2017-08-27 NOTE — Patient Instructions (Signed)
Hemoglobin A1c is 10.6, which is above goal.  Will increase Lantus to 30 units at bedtime and will increase Humalog for meal coverage at 6 units prior to meals.  Bring glucometer to follow-up appointment.  Recommend 5-6 carbohydrate modified meals throughout the day, also increase water intake to 6 to 8 glasses.  No sweet tea, soda, juice, or alcohol.  Also recommend exercising 150 minutes of low impact cardiovascular per week.   For bilateral lower extremity neuropathy, will increase gabapentin to 300 mg 3 times per day.    We will follow-up by phone with any abnormal laboratory results.   We will recheck hemoglobin A1c in 4 weeks

## 2017-08-27 NOTE — Progress Notes (Signed)
Chief Complaint  Patient presents with  . Diabetes    Subjective:    Patient ID: Micheal Neal, male    DOB: [birth year], 47 y.o.   MRN: [id number]  A 47 year old male with a history of uncontrolled diabetes mellitus presents accompanied by wife for follow-up.  Patient states that he has not been taking medications consistently or following a low carbohydrate diet.  He states that he recently returned from a two-week trip to [place], where he ate mostly high fat/high cholesterol foods.  Also patient has not been following an exercise routine over the past month. Body mass index is 33.9 kg/m.  Angels most recent hemoglobin A1c was 8.7. Patient did not bring glucometer to appointment due to the fact that he has not been checking blood sugar consistently.  CBG 251 on arrival and patient is fasting.  He currently endorses blurred vision and polyuria.  He denies headache, chest pain, heart palpitation,  nausea, polydipsia, or polyphagia.  Past Medical History:  Diagnosis Date  . Diabetes mellitus without complication [facility])    Social History   Socioeconomic History  . Marital status: Married    Spouse name: Not on file  . Number of children: Not on file  . Years of education: Not on file  . Highest education level: Not on file  Occupational History  . Not on file  Social Needs  . Financial resource strain: Not on file  . Food insecurity:    Worry: Not on file    Inability: Not on file  . Transportation needs:    Medical: Not on file    Non-medical: Not on file  Tobacco Use  . Smoking status: Never Smoker  . Smokeless tobacco: Never Used  Substance and Sexual Activity  . Alcohol use: No  . Drug use: No  . Sexual activity: Not on file  Lifestyle  . Physical activity:    Days per week: Not on file    Minutes per session: Not on file  . Stress: Not on file  Relationships  . Social connections:    Talks on phone: Not on file    Gets together: Not on file    Attends religious  service: Not on file    Active member of club or organization: Not on file    Attends meetings of clubs or organizations: Not on file    Relationship status: Not on file  . Intimate partner violence:    Fear of current or ex partner: Not on file    Emotionally abused: Not on file    Physically abused: Not on file    Forced sexual activity: Not on file  Other Topics Concern  . Not on file  Social History Narrative  . Not on file   Immunization History  Administered Date(s) Administered  . Pneumococcal Conjugate-13 10/14/2014  . Tdap 10/14/2014   Review of Systems  Constitutional: Negative for fatigue and fever.  HENT: Negative.   Respiratory: Negative.  Negative for cough and shortness of breath.   Cardiovascular: Negative.  Negative for chest pain and palpitations.  Gastrointestinal: Negative.   Endocrine: Negative for polydipsia, polyphagia and polyuria.  Musculoskeletal: Negative.   Skin: Negative.   Allergic/Immunologic: Negative.  Negative for immunocompromised state.  Neurological: Negative.   Psychiatric/Behavioral: Negative.        Objective:   Physical Exam  Constitutional: He is oriented to person, place, and time. He appears well-developed and well-nourished.  HENT:  Head: Normocephalic and atraumatic.  Right Ear:  External ear normal.  Left Ear: External ear normal.  Nose: Nose normal.  Mouth/Throat: Oropharynx is clear and moist.  Eyes: Pupils are equal, round, and reactive to light.  Neck: Normal range of motion. Neck supple.  Cardiovascular: Normal rate, regular rhythm, normal heart sounds and intact distal pulses.  Pulmonary/Chest: Effort normal and breath sounds normal.  Abdominal: Soft. Bowel sounds are normal.  Musculoskeletal: Normal range of motion.  Neurological: He is alert and oriented to person, place, and time. He has normal reflexes.         BP 105/72 (BP Location: Left Arm, Patient Position: Sitting, Cuff Size: Large)   Pulse 99    Temp 98.4 F (36.9 C) (Oral)   Resp 16   Ht  (1.88 m)   Wt 264 lb (119.7 kg)   SpO2 99%   BMI 33.90 kg/m  Assessment & Plan:   1. Uncontrolled type 2 diabetes mellitus with hyperglycemia (HCC) Hemoglobin A1c has increased to 10.6, which is above goal.  Increase Lantus to 30 units at bedtime and Humalog to 6 units prior to meals.  Also recommend a carbohydrate modify diet divided over 5-6 small meals throughout the day.  Discussed starting a low impact cardiovascular exercise routine 450 minutes/week - HgB A1c - Glucose (CBG) - Urinalysis Dipstick - insulin lispro (HUMALOG KWIKPEN) 100 UNIT/ML KiwkPen; Inject 0.06 mLs (6 Units total) into the skin 3 (three) times daily.  Dispense: 15 mL; Refill: 11 - Insulin Glargine (LANTUS SOLOSTAR) 100 UNIT/ML Solostar Pen; Inject 30 Units into the skin daily at 10 pm.  Dispense: 5 pen; Refill: 11 - Comprehensive metabolic panel  2. Diabetic polyneuropathy associated with type 2 diabetes mellitus (HCC) We will increase gabapentin to 300 mg 3 times per day - gabapentin (NEURONTIN) 300 MG capsule; Take 1 capsule (300 mg total) by mouth 3 (three) times daily.  Dispense: 90 capsule; Refill: 5 - Lipid Panel  3. Obesity (BMI 30-39.9) The patient is asked to make an attempt to improve diet and exercise patterns to aid in medical management of this problem.   RTC: We will repeat hemoglobin A1c in 4 weeks and patient advised to bring glucometer to follow-up appointment for review.   Nolon Nations  MSN, FNP-C Patient Care [facility] [address] [place], [place] [address] [phone]

## 2017-08-28 LAB — COMPREHENSIVE METABOLIC PANEL
ALK PHOS: 95 IU/L (ref 39–117)
ALT: 15 IU/L (ref 0–44)
AST: 12 IU/L (ref 0–40)
Albumin/Globulin Ratio: 1.3 (ref 1.2–2.2)
Albumin: 4.3 g/dL (ref 3.5–5.5)
BUN/Creatinine Ratio: 20 (ref 9–20)
BUN: 19 mg/dL (ref 6–24)
Bilirubin Total: 0.8 mg/dL (ref 0.0–1.2)
CALCIUM: 9.1 mg/dL (ref 8.7–10.2)
CO2: 22 mmol/L (ref 20–29)
CREATININE: 0.97 mg/dL (ref 0.76–1.27)
Chloride: 98 mmol/L (ref 96–106)
GFR calc Af Amer: 109 mL/min/{1.73_m2} (ref >59)
GFR, EST NON AFRICAN AMERICAN: 94 mL/min/{1.73_m2} (ref >59)
Globulin, Total: 3.4 g/dL (ref 1.5–4.5)
Glucose: 268 mg/dL — ABNORMAL HIGH (ref 65–99)
POTASSIUM: 4.5 mmol/L (ref 3.5–5.2)
SODIUM: 135 mmol/L (ref 134–144)
Total Protein: 7.7 g/dL (ref 6.0–8.5)

## 2017-08-28 LAB — LIPID PANEL
CHOL/HDL RATIO: 5.9 ratio — AB (ref 0.0–5.0)
CHOLESTEROL TOTAL: 200 mg/dL — AB (ref 100–199)
HDL: 34 mg/dL — AB (ref >39)
LDL Calculated: 121 mg/dL — ABNORMAL HIGH (ref 0–99)
Triglycerides: 224 mg/dL — ABNORMAL HIGH (ref 0–149)
VLDL Cholesterol Cal: 45 mg/dL — ABNORMAL HIGH (ref 5–40)

## 2017-10-08 ENCOUNTER — Ambulatory Visit: Payer: Medicaid Other | Admitting: Family Medicine

## 2017-10-14 MED FILL — $HUMALOG 100 UNITS/ML KWIKP: 100 | 33 days supply | Qty: 6 | Fill #1

## 2017-10-14 MED FILL — $LANTUS SOLOSTAR 100 UNITS/: 100 | 30 days supply | Qty: 9 | Fill #1

## 2017-10-14 MED FILL — GABAPENTIN 300 MG CAPSULE: 300 | 30 days supply | Qty: 90 | Fill #1

## 2017-10-14 MED FILL — LISINOPRIL 2.5 MG TABLET: 2.5 | 30 days supply | Qty: 30 | Fill #3

## 2017-10-14 MED FILL — ROSUVASTATIN CALCIUM 20 MG: 20 | 30 days supply | Qty: 30 | Fill #2

## 2017-10-17 ENCOUNTER — Ambulatory Visit: Payer: Medicaid Other | Admitting: Family Medicine

## 2017-10-31 ENCOUNTER — Ambulatory Visit (INDEPENDENT_AMBULATORY_CARE_PROVIDER_SITE_OTHER): Payer: Self-pay | Admitting: Family Medicine

## 2017-10-31 VITALS — BP 108/71 | HR 93 | Temp 98.1°F | Resp 16 | Ht 74.0 in | Wt 264.0 lb

## 2017-10-31 DIAGNOSIS — E1165 Type 2 diabetes mellitus with hyperglycemia: Secondary | ICD-10-CM

## 2017-10-31 LAB — POCT GLYCOSYLATED HEMOGLOBIN (HGB A1C): Hemoglobin A1C: 11.8 % — AB (ref 4.0–5.6)

## 2017-10-31 MED ORDER — INSULIN GLARGINE 100 UNIT/ML SOLOSTAR PEN: 40.0000 [IU] | PEN_INJECTOR | Freq: Every day | SUBCUTANEOUS | 11 refills | Status: DC

## 2017-10-31 NOTE — Patient Instructions (Addendum)
I increased your Lantus to 40 units daily.   Diabetes Mellitus and Nutrition When you have diabetes (diabetes mellitus), it is very important to have healthy eating habits because your blood sugar (glucose) levels are greatly affected by what you eat and drink. Eating healthy foods in the appropriate amounts, at about the same times every day, can help you:  Control your blood glucose.  Lower your risk of heart disease.  Improve your blood pressure.  Reach or maintain a healthy weight.  Every person with diabetes is different, and each person has different needs for a meal plan. Your health care provider may recommend that you work with a diet and nutrition specialist (dietitian) to make a meal plan that is best for you. Your meal plan may vary depending on factors such as:  The calories you need.  The medicines you take.  Your weight.  Your blood glucose, blood pressure, and cholesterol levels.  Your activity level.  Other health conditions you have, such as heart or kidney disease.  How do carbohydrates affect me? Carbohydrates affect your blood glucose level more than any other type of food. Eating carbohydrates naturally increases the amount of glucose in your blood. Carbohydrate counting is a method for keeping track of how many carbohydrates you eat. Counting carbohydrates is important to keep your blood glucose at a healthy level, especially if you use insulin or take certain oral diabetes medicines. It is important to know how many carbohydrates you can safely have in each meal. This is different for every person. Your dietitian can help you calculate how many carbohydrates you should have at each meal and for snack. Foods that contain carbohydrates include:  Bread, cereal, rice, pasta, and crackers.  Potatoes and corn.  Peas, beans, and lentils.  Milk and yogurt.  Fruit and juice.  Desserts, such as cakes, cookies, ice cream, and candy.  How does alcohol affect  me? Alcohol can cause a sudden decrease in blood glucose (hypoglycemia), especially if you use insulin or take certain oral diabetes medicines. Hypoglycemia can be a life-threatening condition. Symptoms of hypoglycemia (sleepiness, dizziness, and confusion) are similar to symptoms of having too much alcohol. If your health care provider says that alcohol is safe for you, follow these guidelines:  Limit alcohol intake to no more than 1 drink per day for nonpregnant women and 2 drinks per day for men. One drink equals 12 oz of beer, 5 oz of wine, or 1 oz of hard liquor.  Do not drink on an empty stomach.  Keep yourself hydrated with water, diet soda, or unsweetened iced tea.  Keep in mind that regular soda, juice, and other mixers may contain a lot of sugar and must be counted as carbohydrates.  What are tips for following this plan? Reading food labels  Start by checking the serving size on the label. The amount of calories, carbohydrates, fats, and other nutrients listed on the label are based on one serving of the food. Many foods contain more than one serving per package.  Check the total grams (g) of carbohydrates in one serving. You can calculate the number of servings of carbohydrates in one serving by dividing the total carbohydrates by 15. For example, if a food has 30 g of total carbohydrates, it would be equal to 2 servings of carbohydrates.  Check the number of grams (g) of saturated and trans fats in one serving. Choose foods that have low or no amount of these fats.  Check the number  of milligrams (mg) of sodium in one serving. Most people should limit total sodium intake to less than 2,300 mg per day.  Always check the nutrition information of foods labeled as "low-fat" or "nonfat". These foods may be higher in added sugar or refined carbohydrates and should be avoided.  Talk to your dietitian to identify your daily goals for nutrients listed on the label. Shopping  Avoid  buying canned, premade, or processed foods. These foods tend to be high in fat, sodium, and added sugar.  Shop around the outside edge of the grocery store. This includes fresh fruits and vegetables, bulk grains, fresh meats, and fresh dairy. Cooking  Use low-heat cooking methods, such as baking, instead of high-heat cooking methods like deep frying.  Cook using healthy oils, such as olive, canola, or sunflower oil.  Avoid cooking with butter, cream, or high-fat meats. Meal planning  Eat meals and snacks regularly, preferably at the same times every day. Avoid going long periods of time without eating.  Eat foods high in fiber, such as fresh fruits, vegetables, beans, and whole grains. Talk to your dietitian about how many servings of carbohydrates you can eat at each meal.  Eat 4-6 ounces of lean protein each day, such as lean meat, chicken, fish, eggs, or tofu. 1 ounce is equal to 1 ounce of meat, chicken, or fish, 1 egg, or 1/4 cup of tofu.  Eat some foods each day that contain healthy fats, such as avocado, nuts, seeds, and fish. Lifestyle   Check your blood glucose regularly.  Exercise at least 30 minutes 5 or more days each week, or as told by your health care provider.  Take medicines as told by your health care provider.  Do not use any products that contain nicotine or tobacco, such as cigarettes and e-cigarettes. If you need help quitting, ask your health care provider.  Work with a Veterinary surgeoncounselor or diabetes educator to identify strategies to manage stress and any emotional and social challenges. What are some questions to ask my health care provider?  Do I need to meet with a diabetes educator?  Do I need to meet with a dietitian?  What number can I call if I have questions?  When are the best times to check my blood glucose? Where to find more information:  American Diabetes Association: diabetes.org/food-and-fitness/food  Academy of Nutrition and Dietetics:  https://www.vargas.com/www.eatright.org/resources/health/diseases-and-conditions/diabetes  [employer] of Diabetes and Digestive and Kidney Diseases (NIH): FindJewelers.czwww.niddk.nih.gov/health-information/diabetes/overview/diet-eating-physical-activity Summary  A healthy meal plan will help you control your blood glucose and maintain a healthy lifestyle.  Working with a diet and nutrition specialist (dietitian) can help you make a meal plan that is best for you.  Keep in mind that carbohydrates and alcohol have immediate effects on your blood glucose levels. It is important to count carbohydrates and to use alcohol carefully. This information is not intended to replace advice given to you by your health care provider. Make sure you discuss any questions you have with your health care provider. Document Released: 12/13/2004 Document Revised: 04/22/2016 Document Reviewed: 04/22/2016 Elsevier Interactive Patient Education  [employer]2018 Elsevier Inc.

## 2017-10-31 NOTE — Progress Notes (Signed)
SUBJECTIVE: 47 y.o. male for follow up of diabetes. Diabetic Review of Systems - diabetic diet compliance: noncompliant much of the time. Patient states that he eats rice, beans and tortillas as  Other symptoms and concerns none.  Current Outpatient Medications  Medication Sig Dispense Refill  . acetaminophen (TYLENOL) 500 MG tablet Take 1 tablet (500 mg total) by mouth every 6 (six) hours as needed. 30 tablet 0  . aspirin EC 81 MG tablet Take 1 tablet (81 mg total) by mouth daily. 30 tablet 11  . gabapentin (NEURONTIN) 300 MG capsule Take 1 capsule (300 mg total) by mouth 3 (three) times daily. 90 capsule 5  . Insulin Glargine (LANTUS SOLOSTAR) 100 UNIT/ML Solostar Pen Inject 40 Units into the skin at bedtime. 5 pen 11  . insulin lispro (HUMALOG KWIKPEN) 100 UNIT/ML KiwkPen Inject 0.06 mLs (6 Units total) into the skin 3 (three) times daily. 15 mL 11  . Insulin Syringe-Needle U-100 (BD INSULIN SYRINGE ULTRAFINE) 31G X 5/16" 0.5 ML MISC 1 each by Does not apply route 4 (four) times daily -  before meals and at bedtime. 100 each 11  . lisinopril (PRINIVIL,ZESTRIL) 2.5 MG tablet Take 1 tablet (2.5 mg total) by mouth daily. 30 tablet 5  . rosuvastatin (CRESTOR) 20 MG tablet Take 1 tablet (20 mg total) by mouth daily. 90 tablet 1   No current facility-administered medications for this visit.     OBJECTIVE: Appearance: alert, well appearing, and in no distress, oriented to person, place, and time and well hydrated. BP 108/71 (BP Location: Left Arm, Patient Position: Sitting, Cuff Size: Large)   Pulse 93   Temp 98.1 F (36.7 C) (Oral)   Resp 16   Ht 6\' 2"  (1.88 m)   Wt 264 lb (119.7 kg)   SpO2 100%   BMI 33.90 kg/m   Exam: heart sounds normal rate, regular rhythm, normal S1, S2, no murmurs, rubs, clicks or gallops, chest clear, no hepatosplenomegaly, no carotid bruits  ASSESSMENT: Diabetes Mellitus: no significant medication side effects noted, poorly controlled, needs improvement and  needs to follow diet more regularly  PLAN: See orders for this visit as documented in the electronic medical record. Issues reviewed with him: low cholesterol diet, weight control and daily exercise discussed, home glucose monitoring emphasized, all medications, side effects and compliance discussed carefully, use and side effects of insulin is taught, annual eye examinations at Ophthalmology discussed and long term diabetic complications discussed. 1. Uncontrolled type 2 diabetes mellitus with hyperglycemia (HCC) Increased lantus to 40 units QHS.  - HgB A1c - Insulin Glargine (LANTUS SOLOSTAR) 100 UNIT/ML Solostar Pen; Inject 40 Units into the skin at bedtime.  Dispense: 5 pen; Refill: 11

## 2017-11-03 ENCOUNTER — Encounter: Payer: Self-pay | Admitting: Family Medicine

## 2017-11-18 MED FILL — $HUMALOG 100 UNITS/ML KWIKP: 100 | 33 days supply | Qty: 6 | Fill #2

## 2017-11-18 MED FILL — ROSUVASTATIN CALCIUM 20 MG: 20 | 30 days supply | Qty: 30 | Fill #3

## 2017-11-18 MED FILL — LISINOPRIL 2.5 MG TABLET: 2.5 | 30 days supply | Qty: 30 | Fill #4

## 2017-11-18 MED FILL — $LANTUS SOLOSTAR 100 UNITS/: 100 | 30 days supply | Qty: 9 | Fill #2

## 2017-11-26 ENCOUNTER — Ambulatory Visit: Payer: Self-pay | Attending: Family Medicine

## 2018-01-08 MED FILL — ROSUVASTATIN CALCIUM 20 MG: 20 | 30 days supply | Qty: 30 | Fill #4

## 2018-01-08 MED FILL — $HUMALOG 100 UNITS/ML KWIKP: 100 | 99 days supply | Qty: 18 | Fill #3

## 2018-01-08 MED FILL — LISINOPRIL 2.5 MG TABLET: 2.5 | 30 days supply | Qty: 30 | Fill #5

## 2018-01-08 MED FILL — $LANTUS SOLOSTAR 100 UNITS/: 100 | 90 days supply | Qty: 27 | Fill #3

## 2018-01-22 ENCOUNTER — Telehealth: Payer: Self-pay

## 2018-01-22 NOTE — Telephone Encounter (Signed)
Patient will be at appointment tomorrow at 1040am.

## 2018-01-23 ENCOUNTER — Ambulatory Visit (INDEPENDENT_AMBULATORY_CARE_PROVIDER_SITE_OTHER): Payer: Self-pay | Admitting: Family Medicine

## 2018-01-23 ENCOUNTER — Other Ambulatory Visit: Payer: Self-pay

## 2018-01-23 VITALS — BP 106/66 | HR 90 | Temp 98.1°F | Resp 16 | Ht 74.0 in | Wt 268.0 lb

## 2018-01-23 DIAGNOSIS — E1165 Type 2 diabetes mellitus with hyperglycemia: Secondary | ICD-10-CM

## 2018-01-23 DIAGNOSIS — E785 Hyperlipidemia, unspecified: Secondary | ICD-10-CM

## 2018-01-23 LAB — POCT URINALYSIS DIPSTICK
Bilirubin, UA: NEGATIVE
Blood, UA: NEGATIVE
Glucose, UA: POSITIVE — AB
Ketones, UA: NEGATIVE
Leukocytes, UA: NEGATIVE
Nitrite, UA: NEGATIVE
Protein, UA: NEGATIVE
Spec Grav, UA: 1.015 (ref 1.010–1.025)
Urobilinogen, UA: 0.2 E.U./dL
pH, UA: 6.5 (ref 5.0–8.0)

## 2018-01-23 LAB — GLUCOSE, POCT (MANUAL RESULT ENTRY): POC Glucose: 304 mg/dl — AB (ref 70–99)

## 2018-01-23 LAB — POCT GLYCOSYLATED HEMOGLOBIN (HGB A1C): Hemoglobin A1C: 12.7 % — AB (ref 4.0–5.6)

## 2018-01-23 MED ORDER — ROSUVASTATIN CALCIUM 20 MG PO TABS: 20.0000 mg | ORAL_TABLET | Freq: Every day | ORAL | 1 refills | Status: DC

## 2018-01-23 MED ORDER — INSULIN GLARGINE 100 UNIT/ML SOLOSTAR PEN: 55.0000 [IU] | PEN_INJECTOR | Freq: Every day | SUBCUTANEOUS | 11 refills | Status: DC

## 2018-01-23 MED ORDER — LISINOPRIL 2.5 MG PO TABS: 2.5000 mg | ORAL_TABLET | Freq: Every day | ORAL | 5 refills | Status: DC

## 2018-01-23 MED ORDER — INSULIN LISPRO 100 UNIT/ML (KWIKPEN)
10.0000 [IU] | PEN_INJECTOR | Freq: Three times a day (TID) | SUBCUTANEOUS | 11 refills | Status: DC
Start: 2018-01-23 — End: 2021-08-13

## 2018-01-23 NOTE — Patient Instructions (Addendum)
Your A1C increased to 12.7. I am increasing your Lantus to 55 units at night. Your humalog is increasing to 10 units 3 times per day.  I will see you again in 6 weeks to monitor your progress.   Breakfast:  "Break your fast" 2 slices of [place] bacon 1 boiled egg 2 six ounce glasses of water Coffee/tea (Splenda/stevia) Snack:  High protein (10 almonds, cottage cheese, or greek yogurt)   Lunch:  Small garden salad (oil & vinegar)  can of tuna (or chicken breast, lamb chop, salmon, etc)   Snack:  Almonds ( or protein shake)   Dinner:  [employer], vegetable, and complex carbohydrate (brown rice, kenoa, sweet potato)  Diabetes Mellitus and Nutrition When you have diabetes (diabetes mellitus), it is very important to have healthy eating habits because your blood sugar (glucose) levels are greatly affected by what you eat and drink. Eating healthy foods in the appropriate amounts, at about the same times every day, can help you:  Control your blood glucose.  Lower your risk of heart disease.  Improve your blood pressure.  Reach or maintain a healthy weight.  Every person with diabetes is different, and each person has different needs for a meal plan. Your health care provider may recommend that you work with a diet and nutrition specialist (dietitian) to make a meal plan that is best for you. Your meal plan may vary depending on factors such as:  The calories you need.  The medicines you take.  Your weight.  Your blood glucose, blood pressure, and cholesterol levels.  Your activity level.  Other health conditions you have, such as heart or kidney disease.  How do carbohydrates affect me? Carbohydrates affect your blood glucose level more than any other type of food. Eating carbohydrates naturally increases the amount of glucose in your blood. Carbohydrate counting is a method for keeping track of how many carbohydrates you eat. Counting carbohydrates is  important to keep your blood glucose at a healthy level, especially if you use insulin or take certain oral diabetes medicines. It is important to know how many carbohydrates you can safely have in each meal. This is different for every person. Your dietitian can help you calculate how many carbohydrates you should have at each meal and for snack. Foods that contain carbohydrates include:  Bread, cereal, rice, pasta, and crackers.  Potatoes and corn.  Peas, beans, and lentils.  Milk and yogurt.  Fruit and juice.  Desserts, such as cakes, cookies, ice cream, and candy.  How does alcohol affect me? Alcohol can cause a sudden decrease in blood glucose (hypoglycemia), especially if you use insulin or take certain oral diabetes medicines. Hypoglycemia can be a life-threatening condition. Symptoms of hypoglycemia (sleepiness, dizziness, and confusion) are similar to symptoms of having too much alcohol. If your health care provider says that alcohol is safe for you, follow these guidelines:  Limit alcohol intake to no more than 1 drink per day for nonpregnant women and 2 drinks per day for men. One drink equals 12 oz of beer, 5 oz of wine, or 1 oz of hard liquor.  Do not drink on an empty stomach.  Keep yourself hydrated with water, diet soda, or unsweetened iced tea.  Keep in mind that regular soda, juice, and other mixers may contain a lot of sugar and must be counted as carbohydrates.  What are tips for following this plan? Reading food labels  Start by checking the serving size on the  label. The amount of calories, carbohydrates, fats, and other nutrients listed on the label are based on one serving of the food. Many foods contain more than one serving per package.  Check the total grams (g) of carbohydrates in one serving. You can calculate the number of servings of carbohydrates in one serving by dividing the total carbohydrates by 15. For example, if a food has 30 g of total  carbohydrates, it would be equal to 2 servings of carbohydrates.  Check the number of grams (g) of saturated and trans fats in one serving. Choose foods that have low or no amount of these fats.  Check the number of milligrams (mg) of sodium in one serving. Most people should limit total sodium intake to less than 2,300 mg per day.  Always check the nutrition information of foods labeled as "low-fat" or "nonfat". These foods may be higher in added sugar or refined carbohydrates and should be avoided.  Talk to your dietitian to identify your daily goals for nutrients listed on the label. Shopping  Avoid buying canned, premade, or processed foods. These foods tend to be high in fat, sodium, and added sugar.  Shop around the outside edge of the grocery store. This includes fresh fruits and vegetables, bulk grains, fresh meats, and fresh dairy. Cooking  Use low-heat cooking methods, such as baking, instead of high-heat cooking methods like deep frying.  Cook using healthy oils, such as olive, canola, or sunflower oil.  Avoid cooking with butter, cream, or high-fat meats. Meal planning  Eat meals and snacks regularly, preferably at the same times every day. Avoid going long periods of time without eating.  Eat foods high in fiber, such as fresh fruits, vegetables, beans, and whole grains. Talk to your dietitian about how many servings of carbohydrates you can eat at each meal.  Eat 4-6 ounces of lean protein each day, such as lean meat, chicken, fish, eggs, or tofu. 1 ounce is equal to 1 ounce of meat, chicken, or fish, 1 egg, or 1/4 cup of tofu.  Eat some foods each day that contain healthy fats, such as avocado, nuts, seeds, and fish. Lifestyle   Check your blood glucose regularly.  Exercise at least 30 minutes 5 or more days each week, or as told by your health care provider.  Take medicines as told by your health care provider.  Do not use any products that contain nicotine or  tobacco, such as cigarettes and e-cigarettes. If you need help quitting, ask your health care provider.  Work with a Social worker or diabetes educator to identify strategies to manage stress and any emotional and social challenges. What are some questions to ask my health care provider?  Do I need to meet with a diabetes educator?  Do I need to meet with a dietitian?  What number can I call if I have questions?  When are the best times to check my blood glucose? Where to find more information:  American Diabetes Association: diabetes.org/food-and-fitness/food  Academy of Nutrition and Dietetics: PokerClues.dk  [employer] of Diabetes and Digestive and Kidney Diseases (NIH): ContactWire.be Summary  A healthy meal plan will help you control your blood glucose and maintain a healthy lifestyle.  Working with a diet and nutrition specialist (dietitian) can help you make a meal plan that is best for you.  Keep in mind that carbohydrates and alcohol have immediate effects on your blood glucose levels. It is important to count carbohydrates and to use alcohol carefully. This information  is not intended to replace advice given to you by your health care provider. Make sure you discuss any questions you have with your health care provider. Document Released: 12/13/2004 Document Revised: 04/22/2016 Document Reviewed: 04/22/2016 Elsevier Interactive Patient Education  2018 [employer]. Diabetes Mellitus and Standards of Medical Care Managing diabetes (diabetes mellitus) can be complicated. Your diabetes treatment may be managed by a team of health care providers, including:  A diet and nutrition specialist (registered dietitian).  A nurse.  A certified diabetes educator (CDE).  A diabetes specialist (endocrinologist).  An eye doctor.  A primary care  provider.  A dentist.  Your health care providers follow a schedule in order to help you get the best quality of care. The following schedule is a general guideline for your diabetes management plan. Your health care providers may also give you more specific instructions. HbA1c ( hemoglobin A1c) test This test provides information about blood sugar (glucose) control over the previous 2-3 months. It is used to check whether your diabetes management plan needs to be adjusted.  If you are meeting your treatment goals, this test is done at least 2 times a year.  If you are not meeting treatment goals or if your treatment goals have changed, this test is done 4 times a year.  Blood pressure test  This test is done at every routine medical visit. For most people, the goal is less than 130/80. Ask your health care provider what your goal blood pressure should be. Dental and eye exams  Visit your dentist two times a year.  If you have type 1 diabetes, get an eye exam 3-5 years after you are diagnosed, and then once a year after your first exam. ? If you were diagnosed with type 1 diabetes as a child, get an eye exam when you are age 1 or older and have had diabetes for 3-5 years. After the first exam, you should get an eye exam once a year.  If you have type 2 diabetes, have an eye exam as soon as you are diagnosed, and then once a year after your first exam. Foot care exam  Visual foot exams are done at every routine medical visit. The exams check for cuts, bruises, redness, blisters, sores, or other problems with the feet.  A complete foot exam is done by your health care provider once a year. This exam includes an inspection of the structure and skin of your feet, and a check of the pulses and sensation in your feet. ? Type 1 diabetes: Get your first exam 3-5 years after diagnosis. ? Type 2 diabetes: Get your first exam as soon as you are diagnosed.  Check your feet every day for cuts,  bruises, redness, blisters, or sores. If you have any of these or other problems that are not healing, contact your health care provider. Kidney function test ( urine microalbumin)  This test is done once a year. ? Type 1 diabetes: Get your first test 5 years after diagnosis. ? Type 2 diabetes: Get your first test as soon as you are diagnosed.  If you have chronic kidney disease (CKD), get a serum creatinine and estimated glomerular filtration rate (eGFR) test once a year. Lipid profile (cholesterol, HDL, LDL, triglycerides)  This test should be done when you are diagnosed with diabetes, and every 5 years after the first test. If you are on medicines to lower your cholesterol, you may need to get this test done every year. ? The  goal for LDL is less than 100 mg/dL (5.5 mmol/L). If you are at high risk, the goal is less than 70 mg/dL (3.9 mmol/L). ? The goal for HDL is 40 mg/dL (2.2 mmol/L) for men and 50 mg/dL(2.8 mmol/L) for women. An HDL cholesterol of 60 mg/dL (3.3 mmol/L) or higher gives some protection against heart disease. ? The goal for triglycerides is less than 150 mg/dL (8.3 mmol/L). Immunizations  The yearly flu (influenza) vaccine is recommended for everyone 6 months or older who has diabetes.  The pneumonia (pneumococcal) vaccine is recommended for everyone 2 years or older who has diabetes. If you are 38 or older, you may get the pneumonia vaccine as a series of two separate shots.  The hepatitis B vaccine is recommended for adults shortly after they have been diagnosed with diabetes.  The Tdap (tetanus, diphtheria, and pertussis) vaccine should be given: ? According to normal childhood vaccination schedules, for children. ? Every 10 years, for adults who have diabetes.  The shingles vaccine is recommended for people who have had chicken pox and are 50 years or older. Mental and emotional health  Screening for symptoms of eating disorders, anxiety, and depression is  recommended at the time of diagnosis and afterward as needed. If your screening shows that you have symptoms (you have a positive screening result), you may need further evaluation and be referred to a mental health care provider. Diabetes self-management education  Education about how to manage your diabetes is recommended at diagnosis and ongoing as needed. Treatment plan  Your treatment plan will be reviewed at every medical visit. Summary  Managing diabetes (diabetes mellitus) can be complicated. Your diabetes treatment may be managed by a team of health care providers.  Your health care providers follow a schedule in order to help you get the best quality of care.  Standards of care including having regular physical exams, blood tests, blood pressure monitoring, immunizations, screening tests, and education about how to manage your diabetes.  Your health care providers may also give you more specific instructions based on your individual health. This information is not intended to replace advice given to you by your health care provider. Make sure you discuss any questions you have with your health care provider. Document Released: 01/13/2009 Document Revised: 12/15/2015 Document Reviewed: 12/15/2015 Elsevier Interactive Patient Education  [employer].

## 2018-01-23 NOTE — Progress Notes (Signed)
  [facility]   Progress Note: General Provider: Mike Gip, FNP  SUBJECTIVE:   Micheal Neal is a 47 y.o. male who  has a past medical history of Diabetes mellitus without complication (HCC).. Patient presents today for Diabetes  Patient reports exercising 3 times per week. Not following a carb modified diet. Patient reports compliance with medication. Review of Systems  Constitutional: Negative.   HENT: Negative.   Eyes: Negative.   Respiratory: Negative.   Cardiovascular: Negative.   Gastrointestinal: Negative.   Genitourinary: Negative.   Musculoskeletal: Negative.   Skin: Negative.   Neurological: Negative.   Psychiatric/Behavioral: Negative.      OBJECTIVE: BP 106/66 (BP Location: Left Arm, Patient Position: Sitting, Cuff Size: Large)   Pulse 90   Temp 98.1 F (36.7 C) (Oral)   Resp 16   Ht 6\' 2"  (1.88 m)   Wt 268 lb (121.6 kg)   SpO2 100%   BMI 34.41 kg/m   Physical Exam  Constitutional: He is oriented to person, place, and time. He appears well-developed and well-nourished. No distress.  HENT:  Head: Normocephalic and atraumatic.  Eyes: Pupils are equal, round, and reactive to light. Conjunctivae and EOM are normal.  Neck: Normal range of motion.  Cardiovascular: Normal rate, regular rhythm, normal heart sounds and intact distal pulses.  Pulmonary/Chest: Effort normal and breath sounds normal. No respiratory distress.  Abdominal: Soft. Bowel sounds are normal. He exhibits no distension.  Musculoskeletal: Normal range of motion.  Neurological: He is alert and oriented to person, place, and time.  Skin: Skin is warm and dry.  Psychiatric: He has a normal mood and affect. His behavior is normal. Thought content normal.  Nursing note and vitals reviewed.   ASSESSMENT/PLAN:  1. Uncontrolled type 2 diabetes mellitus with hyperglycemia (HCC) Increase in a1c today. Increased lantus to 55 unit Qd and humulog 10  units TID.  The patient is asked to make an attempt to improve diet and exercise patterns to aid in medical management of this problem.  - Urinalysis Dipstick - Glucose (CBG) - HgB A1c - lisinopril (PRINIVIL,ZESTRIL) 2.5 MG tablet; Take 1 tablet (2.5 mg total) by mouth daily.  Dispense: 30 tablet; Refill: 5 - insulin lispro (HUMALOG KWIKPEN) 100 UNIT/ML KiwkPen; Inject 0.1 mLs (10 Units total) into the skin 3 (three) times daily.  Dispense: 15 mL; Refill: 11 - Insulin Glargine (LANTUS SOLOSTAR) 100 UNIT/ML Solostar Pen; Inject 55 Units into the skin at bedtime.  Dispense: 5 pen; Refill: 11  2. Hyperlipidemia, unspecified hyperlipidemia type - rosuvastatin (CRESTOR) 20 MG tablet; Take 1 tablet (20 mg total) by mouth daily.  Dispense: 90 tablet; Refill: 1        The patient was given clear instructions to go to ER or return to medical center if symptoms do not improve, worsen or new problems develop. The patient verbalized understanding and agreed with plan of care.   Ms. Micheal Neal. Micheal Lam, FNP-BC [facility] [address] [place], [place] [address] [phone]     This note has been created with Dragon speech recognition software and smart phrase technology. Any transcriptional errors are unintentional.

## 2018-02-05 ENCOUNTER — Ambulatory Visit: Payer: Medicaid Other | Admitting: Family Medicine

## 2018-02-09 ENCOUNTER — Encounter: Payer: Self-pay | Admitting: Family Medicine

## 2018-02-24 MED FILL — LISINOPRIL 2.5 MG TABLET: 2.5 | 30 days supply | Qty: 30 | Fill #0

## 2018-02-24 MED FILL — ROSUVASTATIN CALCIUM 20 MG: 20 | 30 days supply | Qty: 30 | Fill #5

## 2018-03-06 ENCOUNTER — Encounter: Payer: Self-pay | Admitting: Family Medicine

## 2018-03-06 ENCOUNTER — Ambulatory Visit (INDEPENDENT_AMBULATORY_CARE_PROVIDER_SITE_OTHER): Payer: Self-pay | Admitting: Family Medicine

## 2018-03-06 VITALS — BP 133/84 | HR 82 | Temp 98.7°F | Resp 16 | Ht 74.0 in | Wt 282.0 lb

## 2018-03-06 DIAGNOSIS — E1165 Type 2 diabetes mellitus with hyperglycemia: Secondary | ICD-10-CM

## 2018-03-06 DIAGNOSIS — E785 Hyperlipidemia, unspecified: Secondary | ICD-10-CM

## 2018-03-06 LAB — GLUCOSE, POCT (MANUAL RESULT ENTRY)
POC Glucose: 394 mg/dl — AB (ref 70–99)
POC Glucose: 326 mg/dl — AB (ref 70–99)

## 2018-03-06 LAB — POCT URINALYSIS DIPSTICK
Bilirubin, UA: NEGATIVE
Blood, UA: NEGATIVE
Glucose, UA: POSITIVE — AB
Ketones, UA: NEGATIVE
Leukocytes, UA: NEGATIVE
Nitrite, UA: NEGATIVE
Protein, UA: NEGATIVE
Spec Grav, UA: 1.015 (ref 1.010–1.025)
Urobilinogen, UA: 0.2 E.U./dL
pH, UA: 5.5 (ref 5.0–8.0)

## 2018-03-06 MED ORDER — ROSUVASTATIN CALCIUM 20 MG PO TABS: 20.0000 mg | ORAL_TABLET | Freq: Every day | ORAL | 1 refills | Status: DC

## 2018-03-06 MED ORDER — INSULIN LISPRO 100 UNIT/ML ~~LOC~~ SOLN
20.0000 [IU] | Freq: Once | SUBCUTANEOUS | Status: AC
  Administered 2018-03-06: 10:00:00 20 [IU] via SUBCUTANEOUS

## 2018-03-06 MED ORDER — INSULIN PEN NEEDLE 29G X 5MM MISC: 1.0000 [IU] | Freq: Four times a day (QID) | 11 refills | Status: DC

## 2018-03-06 MED ORDER — GLIPIZIDE ER 10 MG PO TB24: 10.0000 mg | ORAL_TABLET | Freq: Every day | ORAL | 2 refills | Status: DC

## 2018-03-06 MED FILL — LISINOPRIL 2.5 MG TABLET: 2.5 | 30 days supply | Qty: 30 | Fill #0

## 2018-03-06 MED FILL — TRUEPLUS PEN NDL 31GX3/16: 31G X 5 MM | 25 days supply | Qty: 100 | Fill #0

## 2018-03-06 MED FILL — TRUEPLUS PEN NDL 31GX3/16": 31G X 5 MM | 25 days supply | Qty: 100 | Fill #0

## 2018-03-06 MED FILL — ?GLIPIZIDE ER 10 MG TB24: 10 | 30 days supply | Qty: 30 | Fill #0

## 2018-03-06 MED FILL — ROSUVASTATIN CALCIUM 20 MG: 20 | 30 days supply | Qty: 30 | Fill #5

## 2018-03-06 NOTE — Progress Notes (Signed)
[facility]   Progress Note: General Provider: Mike GipAndre Muhanad Torosyan, FNP  SUBJECTIVE:   Micheal Neal is a 47 y.o. male who  has a past medical history of Diabetes mellitus without complication (HCC).. Patient presents today for Diabetes . Patient is not following carb modified diet. He does report going to the gym to lift weights at least 3 times per week.   24 hour diet recall: eggs, bacon, 2 pieces of toast, tuna salad subway sandwich, 2 soft tacos. Water to drink  Review of Systems  Constitutional: Negative.   HENT: Negative.   Eyes: Negative.   Respiratory: Negative.   Cardiovascular: Negative.   Gastrointestinal: Negative.   Genitourinary: Negative.   Musculoskeletal: Negative.   Skin: Negative.   Neurological: Negative.   Psychiatric/Behavioral: Negative.      OBJECTIVE: BP 133/84 (BP Location: Right Arm, Patient Position: Sitting, Cuff Size: Large)   Pulse 82   Temp 98.7 F (37.1 C) (Oral)   Resp 16   Ht 6\' 2"  (1.88 m)   Wt 282 lb (127.9 kg)   SpO2 99%   BMI 36.21 kg/m   Wt Readings from Last 3 Encounters:  03/06/18 282 lb (127.9 kg)  01/23/18 268 lb (121.6 kg)  10/31/17 264 lb (119.7 kg)     Physical Exam Vitals signs and nursing note reviewed.  Constitutional:      General: He is not in acute distress.    Appearance: He is well-developed.  HENT:     Head: Normocephalic and atraumatic.  Eyes:     Conjunctiva/sclera: Conjunctivae normal.     Pupils: Pupils are equal, round, and reactive to light.  Neck:     Musculoskeletal: Normal range of motion.  Cardiovascular:     Rate and Rhythm: Normal rate and regular rhythm.     Heart sounds: Normal heart sounds.  Pulmonary:     Effort: Pulmonary effort is normal. No respiratory distress.     Breath sounds: Normal breath sounds.  Abdominal:     General: Bowel sounds are normal. There is no distension.     Palpations: Abdomen is soft.  Musculoskeletal: Normal  range of motion.  Skin:    General: Skin is warm and dry.  Neurological:     Mental Status: He is alert and oriented to person, place, and time.  Psychiatric:        Behavior: Behavior normal.        Thought Content: Thought content normal.     ASSESSMENT/PLAN:  1. Uncontrolled type 2 diabetes mellitus with hyperglycemia (HCC) Start glucotrol XL 10 mg daily. 20 units of humalog given in the office today due to hyperglycemia.  - Glucose (CBG) - Urinalysis Dipstick - insulin lispro (HUMALOG) injection 20 Units - Glucose (CBG) - glipiZIDE (GLUCOTROL XL) 10 MG 24 hr tablet; Take 1 tablet (10 mg total) by mouth daily with breakfast.  Dispense: 90 tablet; Refill: 2 - Insulin Pen Needle 29G X 5MM MISC; 1 Units by Does not apply route 4 (four) times daily.  Dispense: 100 each; Refill: 11  2. Hyperlipidemia, unspecified hyperlipidemia type - rosuvastatin (CRESTOR) 20 MG tablet; Take 1 tablet (20 mg total) by mouth daily.  Dispense: 90 tablet; Refill: 1        The patient was given clear instructions to go to ER or return to medical center if symptoms do not improve, worsen or new problems develop. The patient verbalized understanding and agreed with plan of care.  Ms. Doug Sou. Nathaneil Canary, FNP-BC [facility] [address] [place], [place] [address] [phone]     This note has been created with Dragon speech recognition software and smart phrase technology. Any transcriptional errors are unintentional.

## 2018-03-06 NOTE — Patient Instructions (Addendum)
Breakfast:  "Break your fast" 2 slices of turkey bacon 1 boiled egg 2 six ounce glasses of water Coffee/tea (Splenda/stevia) Snack:  High protein (10 almonds, cottage cheese, or greek yogurt)   Lunch:  Small garden salad (oil & vinegar)  can of tuna (or chicken breast, lamb chop, salmon, etc)   Snack:  Almonds ( or protein shake)   Dinner:  Sensible dinner Lean meat, vegetable, and complex carbohydrate (brown rice, kenoa, sweet potato)  Diabetes Mellitus and Nutrition When you have diabetes (diabetes mellitus), it is very important to have healthy eating habits because your blood sugar (glucose) levels are greatly affected by what you eat and drink. Eating healthy foods in the appropriate amounts, at about the same times every day, can help you:  Control your blood glucose.  Lower your risk of heart disease.  Improve your blood pressure.  Reach or maintain a healthy weight.  Every person with diabetes is different, and each person has different needs for a meal plan. Your health care provider may recommend that you work with a diet and nutrition specialist (dietitian) to make a meal plan that is best for you. Your meal plan may vary depending on factors such as:  The calories you need.  The medicines you take.  Your weight.  Your blood glucose, blood pressure, and cholesterol levels.  Your activity level.  Other health conditions you have, such as heart or kidney disease.  How do carbohydrates affect me? Carbohydrates affect your blood glucose level more than any other type of food. Eating carbohydrates naturally increases the amount of glucose in your blood. Carbohydrate counting is a method for keeping track of how many carbohydrates you eat. Counting carbohydrates is important to keep your blood glucose at a healthy level, especially if you use insulin or take certain oral diabetes medicines. It is important to know how many carbohydrates you can safely have in each  meal. This is different for every person. Your dietitian can help you calculate how many carbohydrates you should have at each meal and for snack. Foods that contain carbohydrates include:  Bread, cereal, rice, pasta, and crackers.  Potatoes and corn.  Peas, beans, and lentils.  Milk and yogurt.  Fruit and juice.  Desserts, such as cakes, cookies, ice cream, and candy.  How does alcohol affect me? Alcohol can cause a sudden decrease in blood glucose (hypoglycemia), especially if you use insulin or take certain oral diabetes medicines. Hypoglycemia can be a life-threatening condition. Symptoms of hypoglycemia (sleepiness, dizziness, and confusion) are similar to symptoms of having too much alcohol. If your health care provider says that alcohol is safe for you, follow these guidelines:  Limit alcohol intake to no more than 1 drink per day for nonpregnant women and 2 drinks per day for men. One drink equals 12 oz of beer, 5 oz of wine, or 1 oz of hard liquor.  Do not drink on an empty stomach.  Keep yourself hydrated with water, diet soda, or unsweetened iced tea.  Keep in mind that regular soda, juice, and other mixers may contain a lot of sugar and must be counted as carbohydrates.  What are tips for following this plan? Reading food labels  Start by checking the serving size on the label. The amount of calories, carbohydrates, fats, and other nutrients listed on the label are based on one serving of the food. Many foods contain more than one serving per package.  Check the total grams (g) of carbohydrates in one   serving. You can calculate the number of servings of carbohydrates in one serving by dividing the total carbohydrates by 15. For example, if a food has 30 g of total carbohydrates, it would be equal to 2 servings of carbohydrates.  Check the number of grams (g) of saturated and trans fats in one serving. Choose foods that have low or no amount of these fats.  Check the  number of milligrams (mg) of sodium in one serving. Most people should limit total sodium intake to less than 2,300 mg per day.  Always check the nutrition information of foods labeled as "low-fat" or "nonfat". These foods may be higher in added sugar or refined carbohydrates and should be avoided.  Talk to your dietitian to identify your daily goals for nutrients listed on the label. Shopping  Avoid buying canned, premade, or processed foods. These foods tend to be high in fat, sodium, and added sugar.  Shop around the outside edge of the grocery store. This includes fresh fruits and vegetables, bulk grains, fresh meats, and fresh dairy. Cooking  Use low-heat cooking methods, such as baking, instead of high-heat cooking methods like deep frying.  Cook using healthy oils, such as olive, canola, or sunflower oil.  Avoid cooking with butter, cream, or high-fat meats. Meal planning  Eat meals and snacks regularly, preferably at the same times every day. Avoid going long periods of time without eating.  Eat foods high in fiber, such as fresh fruits, vegetables, beans, and whole grains. Talk to your dietitian about how many servings of carbohydrates you can eat at each meal.  Eat 4-6 ounces of lean protein each day, such as lean meat, chicken, fish, eggs, or tofu. 1 ounce is equal to 1 ounce of meat, chicken, or fish, 1 egg, or 1/4 cup of tofu.  Eat some foods each day that contain healthy fats, such as avocado, nuts, seeds, and fish. Lifestyle   Check your blood glucose regularly.  Exercise at least 30 minutes 5 or more days each week, or as told by your health care provider.  Take medicines as told by your health care provider.  Do not use any products that contain nicotine or tobacco, such as cigarettes and e-cigarettes. If you need help quitting, ask your health care provider.  Work with a counselor or diabetes educator to identify strategies to manage stress and any emotional and  social challenges. What are some questions to ask my health care provider?  Do I need to meet with a diabetes educator?  Do I need to meet with a dietitian?  What number can I call if I have questions?  When are the best times to check my blood glucose? Where to find more information:  American Diabetes Association: diabetes.org/food-and-fitness/food  Academy of Nutrition and Dietetics: www.eatright.org/resources/health/diseases-and-conditions/diabetes  National Institute of Diabetes and Digestive and Kidney Diseases (NIH): www.niddk.nih.gov/health-information/diabetes/overview/diet-eating-physical-activity Summary  A healthy meal plan will help you control your blood glucose and maintain a healthy lifestyle.  Working with a diet and nutrition specialist (dietitian) can help you make a meal plan that is best for you.  Keep in mind that carbohydrates and alcohol have immediate effects on your blood glucose levels. It is important to count carbohydrates and to use alcohol carefully. This information is not intended to replace advice given to you by your health care provider. Make sure you discuss any questions you have with your health care provider. Document Released: 12/13/2004 Document Revised: 04/22/2016 Document Reviewed: 04/22/2016 Elsevier Interactive Patient Education    2018 Elsevier Inc.  

## 2018-03-26 MED FILL — $LANTUS SOLOSTAR 100 UNITS/: 100 | 27 days supply | Qty: 15 | Fill #0

## 2018-04-09 MED FILL — ?GLIPIZIDE ER 10 MG TB24: 10 | 30 days supply | Qty: 30 | Fill #1

## 2018-04-09 MED FILL — LISINOPRIL 2.5 MG TABLET: 2.5 | 30 days supply | Qty: 30 | Fill #1

## 2018-04-14 MED FILL — TRUEPLUS PEN NDL 31GX3/16: 31G X 5 MM | 25 days supply | Qty: 100 | Fill #1

## 2018-04-14 MED FILL — TRUEPLUS PEN NDL 31GX3/16": 31G X 5 MM | 25 days supply | Qty: 100 | Fill #1

## 2018-04-23 ENCOUNTER — Encounter (HOSPITAL_COMMUNITY): Payer: Self-pay | Admitting: Emergency Medicine

## 2018-04-23 ENCOUNTER — Ambulatory Visit (HOSPITAL_COMMUNITY)
Admission: EM | Admit: 2018-04-23 | Discharge: 2018-04-23 | Disposition: A | Discharge status: Home / Self Care | Payer: Medicaid Other | Attending: Family Medicine | Admitting: Family Medicine

## 2018-04-23 ENCOUNTER — Other Ambulatory Visit: Payer: Self-pay

## 2018-04-23 DIAGNOSIS — M79601 Pain in right arm: Secondary | ICD-10-CM | POA: Insufficient documentation

## 2018-04-23 MED ORDER — MELOXICAM 15 MG PO TABS: 15.0000 mg | ORAL_TABLET | Freq: Every day | ORAL | 0 refills | Status: DC

## 2018-04-23 MED FILL — MELOXICAM 15 MG TABLET: 15 | 14 days supply | Qty: 14 | Fill #0

## 2018-04-23 MED FILL — ROSUVASTATIN CALCIUM 20 MG: 20 | 30 days supply | Qty: 30 | Fill #0

## 2018-04-23 NOTE — Discharge Instructions (Signed)
I believe that this pain is related to nerve compression or inflammation of the radial nerve We will place you in a splint and have you take some meloxicam daily for the next few weeks.  Follow up as needed for continued or worsening symptoms

## 2018-04-23 NOTE — ED Triage Notes (Signed)
PT reports pain over right lower arm for 3 weeks. No known injury.

## 2018-04-23 NOTE — ED Provider Notes (Signed)
[facility]    CSN: [id number] Arrival date & time: 04/23/18  [id number]     History   Chief Complaint Chief Complaint  Patient presents with  . Arm Injury    arm pain/ no injury    HPI Micheal Neal is a 48 y.o. male.   Pt is a 48 year old male with PMH of diabetes that presents with approximately 3 weeks of right arm pain. The pain starts in the right wrist area and radiates into the forearm. Describes it as sharp and worse with twisting the arm. He denies  any injury to the arm. Some tingling at times. Hurts when he grips things.  No swelling or deformities. He has not taken anything for the pain. Reports CBG 150 this am.   ROS per HPI      Past Medical History:  Diagnosis Date  . Diabetes mellitus without complication [facility](HCC)     Patient Active Problem List   Diagnosis Date Noted  . Obesity (BMI 30-39.9) 05/24/2017  . Vision changes 05/29/2016  . Insomnia 10/15/2014  . Diabetic neuropathy (HCC) 10/14/2014  . BPPV (benign paroxysmal positional vertigo) 09/18/2014  . Allergic rhinitis 07/01/2014  . DM (diabetes mellitus), type 2, uncontrolled (HCC) 02/20/2011    History reviewed. No pertinent surgical history.     Home Medications    Prior to Admission medications   Medication Sig Start Date End Date Taking? Authorizing Provider  aspirin EC 81 MG tablet Take 1 tablet (81 mg total) by mouth daily. 04/18/17  Yes Massie MaroonHollis, Lachina M, FNP  glipiZIDE (GLUCOTROL XL) 10 MG 24 hr tablet Take 1 tablet (10 mg total) by mouth daily with breakfast. 03/06/18  Yes Mike Gipouglas, Andre, FNP  Insulin Glargine (LANTUS SOLOSTAR) 100 UNIT/ML Solostar Pen Inject 55 Units into the skin at bedtime. 01/23/18  Yes Mike Gipouglas, Andre, FNP  insulin lispro (HUMALOG KWIKPEN) 100 UNIT/ML KiwkPen Inject 0.1 mLs (10 Units total) into the skin 3 (three) times daily. 01/23/18  Yes Mike Gipouglas, Andre, FNP  lisinopril (PRINIVIL,ZESTRIL) 2.5 MG tablet Take 1 tablet (2.5 mg total) by mouth daily. 01/23/18   Yes Mike Gipouglas, Andre, FNP  rosuvastatin (CRESTOR) 20 MG tablet Take 1 tablet (20 mg total) by mouth daily. 03/06/18  Yes Mike Gipouglas, Andre, FNP  acetaminophen (TYLENOL) 500 MG tablet Take 1 tablet (500 mg total) by mouth every 6 (six) hours as needed. 04/08/17   Everlene Farrieransie, William, PA-C  Insulin Pen Needle 29G X 5MM MISC 1 Units by Does not apply route 4 (four) times daily. 03/06/18   Mike Gipouglas, Andre, FNP  Insulin Syringe-Needle U-100 (BD INSULIN SYRINGE ULTRAFINE) 31G X 5/16" 0.5 ML MISC 1 each by Does not apply route 4 (four) times daily -  before meals and at bedtime. 04/18/17   Massie MaroonHollis, Lachina M, FNP  meloxicam (MOBIC) 15 MG tablet Take 1 tablet (15 mg total) by mouth daily. 04/23/18   Janace ArisBast, Christalynn Boise A, NP    Family History No family history on file.  Social History Social History   Tobacco Use  . Smoking status: Never Smoker  . Smokeless tobacco: Never Used  Substance Use Topics  . Alcohol use: No  . Drug use: No     Allergies   Patient has no known allergies.   Review of Systems Review of Systems   Physical Exam Triage Vital Signs ED Triage Vitals  Enc Vitals Group     BP 04/23/18 1017 106/65     Pulse Rate 04/23/18 1017 86     Resp  04/23/18 1017 16     Temp 04/23/18 1017 98.2 F (36.8 C)     Temp Source 04/23/18 1017 Oral     SpO2 04/23/18 1017 98 %     Weight --      Height --      Head Circumference --      Peak Flow --      Pain Score 04/23/18 1015 8     Pain Loc --      Pain Edu? --      Excl. in GC? --    No data found.  Updated Vital Signs BP 106/65   Pulse 86   Temp 98.2 F (36.8 C) (Oral)   Resp 16   SpO2 98%   Visual Acuity Right Eye Distance:   Left Eye Distance:   Bilateral Distance:    Right Eye Near:   Left Eye Near:    Bilateral Near:     Physical Exam Vitals signs and nursing note reviewed.  Constitutional:      Appearance: He is well-developed.  HENT:     Head: Normocephalic and atraumatic.  Eyes:     Conjunctiva/sclera: Conjunctivae  normal.  Neck:     Musculoskeletal: Neck supple.  Cardiovascular:     Rate and Rhythm: Normal rate and regular rhythm.     Heart sounds: No murmur.  Pulmonary:     Effort: Pulmonary effort is normal. No respiratory distress.     Breath sounds: Normal breath sounds.  Abdominal:     Palpations: Abdomen is soft.     Tenderness: There is no abdominal tenderness.  Musculoskeletal: Normal range of motion.        General: Tenderness present. No swelling or deformity.     Comments: Mild tenderness to palpation of the right radial wrist and forearm overlying the tendons.  No bruising swelling or deformities.  Negative Finkelstein Negative Tinel No elbow pain   Skin:    General: Skin is warm and dry.  Neurological:     Mental Status: He is alert.      UC Treatments / Results  Labs (all labs ordered are listed, but only abnormal results are displayed) Labs Reviewed - No data to display  EKG None  Radiology No results found.  Procedures Procedures (including critical care time)  Medications Ordered in UC Medications - No data to display  Initial Impression / Assessment and Plan / UC Course  I have reviewed the triage vital signs and the nursing notes.  Pertinent labs & imaging results that were available during my care of the patient were reviewed by me and considered in my medical decision making (see chart for details).    Symptoms consistent with nerve inflammation or tendonitis.  Will place pt in splint and have him take meloxicam for pain and inflammation .  Instructed to follow up with PCP for continued or worsening symptoms.  Final Clinical Impressions(s) / UC Diagnoses   Final diagnoses:  Right arm pain     Discharge Instructions     I believe that this pain is related to nerve compression or inflammation of the radial nerve We will place you in a splint and have you take some meloxicam daily for the next few weeks.  Follow up as needed for continued or  worsening symptoms     ED Prescriptions    Medication Sig Dispense Auth. Provider   meloxicam (MOBIC) 15 MG tablet Take 1 tablet (15 mg total) by mouth daily. 14 tablet  Janace ArisBast, Astin Sayre A, NP     Controlled Substance Prescriptions [place] Controlled Substance Registry consulted? Not Applicable   Janace ArisBast, Maye Parkinson A, NP 04/23/18 1119

## 2018-05-01 ENCOUNTER — Ambulatory Visit: Payer: Medicaid Other | Admitting: Family Medicine

## 2018-05-06 ENCOUNTER — Encounter: Payer: Self-pay | Admitting: Family Medicine

## 2018-05-06 ENCOUNTER — Ambulatory Visit (INDEPENDENT_AMBULATORY_CARE_PROVIDER_SITE_OTHER): Payer: Self-pay | Admitting: Family Medicine

## 2018-05-06 VITALS — BP 98/64 | HR 108 | Temp 98.9°F | Resp 16 | Ht 74.0 in | Wt 281.0 lb

## 2018-05-06 DIAGNOSIS — E1165 Type 2 diabetes mellitus with hyperglycemia: Secondary | ICD-10-CM

## 2018-05-06 DIAGNOSIS — E669 Obesity, unspecified: Secondary | ICD-10-CM

## 2018-05-06 LAB — POCT GLYCOSYLATED HEMOGLOBIN (HGB A1C): Hemoglobin A1C: 7.3 % — AB (ref 4.0–5.6)

## 2018-05-06 LAB — POCT URINALYSIS DIPSTICK
Bilirubin, UA: NEGATIVE
Blood, UA: NEGATIVE
Glucose, UA: NEGATIVE
Ketones, UA: NEGATIVE
Leukocytes, UA: NEGATIVE
Nitrite, UA: NEGATIVE
Protein, UA: NEGATIVE
Spec Grav, UA: 1.015 (ref 1.010–1.025)
Urobilinogen, UA: 0.2 E.U./dL
pH, UA: 5.5 (ref 5.0–8.0)

## 2018-05-06 LAB — POCT CBG (FASTING - GLUCOSE)-MANUAL ENTRY: Glucose Fasting, POC: 141 mg/dL — AB (ref 70–99)

## 2018-05-06 NOTE — Patient Instructions (Signed)
Let's try a sliding scale for your short acting insulin. Follow the low dose scale in the chart below.  Keep up the good work!

## 2018-05-06 NOTE — Progress Notes (Signed)
[facility]   Progress Note: General Provider: Mike Gip, FNP  SUBJECTIVE:   Micheal Neal is a 48 y.o. male who  has a past medical history of Diabetes mellitus without complication (HCC).. Patient presents today for Diabetes  Patient states that he is exercising on a regular basis. He also states that he is following a carb modified diet. He states that he is not using the fast acting insulin 3 times per day. He states that he forgets it at work.  He reports FBS at 120-150s. Doing well on medications. Denies side effects of medications.   Review of Systems  Constitutional: Negative.   HENT: Negative.   Eyes: Negative.   Respiratory: Negative.   Cardiovascular: Negative.   Gastrointestinal: Negative.   Genitourinary: Negative.   Musculoskeletal: Negative.   Skin: Negative.   Neurological: Negative.   Psychiatric/Behavioral: Negative.      OBJECTIVE: BP 98/64 (BP Location: Left Arm, Patient Position: Sitting, Cuff Size: Large)   Pulse (!) 108   Temp 98.9 F (37.2 C) (Oral)   Resp 16   Ht 6\' 2"  (1.88 m)   Wt 281 lb (127.5 kg)   SpO2 100%   BMI 36.08 kg/m   Wt Readings from Last 3 Encounters:  05/06/18 281 lb (127.5 kg)  03/06/18 282 lb (127.9 kg)  01/23/18 268 lb (121.6 kg)     Physical Exam Vitals signs and nursing note reviewed.  Constitutional:      General: He is not in acute distress.    Appearance: He is well-developed.  HENT:     Head: Normocephalic and atraumatic.  Eyes:     Extraocular Movements: Extraocular movements intact.     Conjunctiva/sclera: Conjunctivae normal.     Pupils: Pupils are equal, round, and reactive to light.  Neck:     Musculoskeletal: Normal range of motion.  Cardiovascular:     Rate and Rhythm: Normal rate and regular rhythm.     Heart sounds: Normal heart sounds.  Pulmonary:     Effort: Pulmonary effort is normal. No respiratory distress.     Breath sounds: Normal  breath sounds.  Musculoskeletal: Normal range of motion.  Skin:    General: Skin is warm and dry.  Neurological:     Mental Status: He is alert and oriented to person, place, and time.  Psychiatric:        Mood and Affect: Mood normal.        Behavior: Behavior normal.        Thought Content: Thought content normal.        Judgment: Judgment normal.     ASSESSMENT/PLAN:   1. Uncontrolled type 2 diabetes mellitus with hyperglycemia (HCC) We discussed following a sliding scale for TID short acting insulin. No other medication changes today. Educated patient on the process of DM and the need for healthy diet and exercise. Discussed Low Dose Sliding insulin scale. Praised for decrease in A1C 12.7 to 7.3.   2. Obesity (BMI 30-39.9) The patient is asked to make an attempt to improve diet and exercise patterns to aid in medical management of this problem.     Return in about 3 months (around 08/04/2018) for DM.    The patient was given clear instructions to go to ER or return to medical center if symptoms do not improve, worsen or new problems develop. The patient verbalized understanding and agreed with plan of care.   Ms. Micheal Neal. Micheal Lam, FNP-BC Patient  [place] [address] [place], [place] [address] [phone]

## 2018-05-07 LAB — COMPREHENSIVE METABOLIC PANEL
ALT: 24 IU/L (ref 0–44)
AST: 23 IU/L (ref 0–40)
Albumin/Globulin Ratio: 1.4 (ref 1.2–2.2)
Albumin: 4.6 g/dL (ref 4.0–5.0)
Alkaline Phosphatase: 94 IU/L (ref 39–117)
BUN/Creatinine Ratio: 19 (ref 9–20)
BUN: 19 mg/dL (ref 6–24)
Bilirubin Total: 0.7 mg/dL (ref 0.0–1.2)
CO2: 22 mmol/L (ref 20–29)
Calcium: 9.4 mg/dL (ref 8.7–10.2)
Chloride: 101 mmol/L (ref 96–106)
Creatinine, Ser: 0.98 mg/dL (ref 0.76–1.27)
GFR calc Af Amer: 107 mL/min/{1.73_m2} (ref >59)
GFR calc non Af Amer: 93 mL/min/{1.73_m2} (ref >59)
Globulin, Total: 3.2 g/dL (ref 1.5–4.5)
Glucose: 138 mg/dL — ABNORMAL HIGH (ref 65–99)
Potassium: 4.8 mmol/L (ref 3.5–5.2)
Sodium: 139 mmol/L (ref 134–144)
Total Protein: 7.8 g/dL (ref 6.0–8.5)

## 2018-05-07 LAB — LIPID PANEL
Chol/HDL Ratio: 3.5 ratio (ref 0.0–5.0)
Cholesterol, Total: 140 mg/dL (ref 100–199)
HDL: 40 mg/dL (ref >39)
LDL Calculated: 72 mg/dL (ref 0–99)
Triglycerides: 140 mg/dL (ref 0–149)
VLDL Cholesterol Cal: 28 mg/dL (ref 5–40)

## 2018-05-07 NOTE — Progress Notes (Signed)
Your labs are stable. Your cholesterol has improved significantly. Keep up the good work. Continue with your current medications. Please remember to keep your follow up appointment. If you have problems, questions or concerns, please make an appointment to discuss. Thanks!

## 2018-05-08 ENCOUNTER — Telehealth: Payer: Self-pay

## 2018-05-08 NOTE — Telephone Encounter (Signed)
Called, no answer. Left a message to call back. Thanks!  

## 2018-05-08 NOTE — Telephone Encounter (Signed)
-----   Message from Mike Gip, FNP sent at 05/07/2018  5:15 PM EST ----- Your labs are stable. Your cholesterol has improved significantly. Keep up the good work. Continue with your current medications. Please remember to keep your follow up appointment. If you have problems, questions or concerns, please make an appointment to discuss. Thanks!

## 2018-05-11 MED FILL — LISINOPRIL 2.5 MG TABLET: 2.5 | 30 days supply | Qty: 30 | Fill #2

## 2018-05-11 MED FILL — glipiZIDE XL 10 MG TB24: 10 | 30 days supply | Qty: 30 | Fill #2

## 2018-05-11 MED FILL — $LANTUS SOLOSTAR 100 UNITS/: 100 | 27 days supply | Qty: 15 | Fill #1

## 2018-05-11 NOTE — Telephone Encounter (Signed)
Called, and spoke with patient. Advised that cholesterol has improved significantly and all labs are stable and to keep up the good work. Asked that he continue all medications and keep follow up. Patient verbalized understanding. Thanks!

## 2018-06-12 MED FILL — $LANTUS SOLOSTAR 100 UNITS/: 100 | 80 days supply | Qty: 45 | Fill #2

## 2018-06-12 MED FILL — LISINOPRIL 2.5 MG TABLET: 2.5 | 30 days supply | Qty: 30 | Fill #3

## 2018-06-12 MED FILL — glipiZIDE XL 10 MG TB24: 10 | 30 days supply | Qty: 30 | Fill #3

## 2018-07-01 ENCOUNTER — Encounter: Payer: Self-pay | Admitting: Family Medicine

## 2018-07-01 ENCOUNTER — Ambulatory Visit (INDEPENDENT_AMBULATORY_CARE_PROVIDER_SITE_OTHER): Payer: Self-pay | Admitting: Family Medicine

## 2018-07-01 ENCOUNTER — Other Ambulatory Visit: Payer: Self-pay

## 2018-07-01 ENCOUNTER — Ambulatory Visit (HOSPITAL_COMMUNITY)
Admission: RE | Admit: 2018-07-01 | Discharge: 2018-07-01 | Disposition: A | Discharge status: Home / Self Care | Payer: Medicaid Other | Source: Ambulatory Visit | Attending: Family Medicine | Admitting: Family Medicine

## 2018-07-01 VITALS — BP 106/68 | HR 100 | Temp 98.1°F | Ht 74.0 in | Wt 276.0 lb

## 2018-07-01 DIAGNOSIS — R109 Unspecified abdominal pain: Secondary | ICD-10-CM

## 2018-07-01 DIAGNOSIS — N61 Mastitis without abscess: Secondary | ICD-10-CM

## 2018-07-01 LAB — POCT URINALYSIS DIP (MANUAL ENTRY)
Bilirubin, UA: NEGATIVE
Blood, UA: NEGATIVE
Glucose, UA: NEGATIVE mg/dL
Ketones, POC UA: NEGATIVE mg/dL
Leukocytes, UA: NEGATIVE
Nitrite, UA: NEGATIVE
Protein Ur, POC: NEGATIVE mg/dL
Spec Grav, UA: 1.025 (ref 1.010–1.025)
Urobilinogen, UA: 0.2 E.U./dL
pH, UA: 5 (ref 5.0–8.0)

## 2018-07-01 MED ORDER — SULFAMETHOXAZOLE-TRIMETHOPRIM 800-160 MG PO TABS: 1.0000 | ORAL_TABLET | Freq: Two times a day (BID) | ORAL | 0 refills | Status: AC

## 2018-07-01 MED ORDER — TRAMADOL HCL 50 MG PO TABS: 50.0000 mg | ORAL_TABLET | Freq: Three times a day (TID) | ORAL | 0 refills | Status: DC | PRN

## 2018-07-01 NOTE — Progress Notes (Signed)
[facility]   Progress Note: Sick Visit Provider: Mike Gip, FNP  SUBJECTIVE:   Micheal Neal is a 48 y.o. male who  has a past medical history of Diabetes mellitus without complication (HCC).. Patient presents today for Flank Pain (right side for 2 months) Patient reports a pain on the right flank x 2 months that is worsened with twisting his torso. Patient states that the pain is "internal"  Denies burning with urination.  Patient also states that he has some tenderness and a lump in the left breast x 1 week.  Review of Systems  Constitutional: Negative.   HENT: Negative.   Eyes: Negative.   Respiratory: Negative.   Cardiovascular: Negative.   Gastrointestinal: Negative.   Genitourinary: Positive for flank pain. Negative for dysuria, frequency and urgency.  Skin: Negative.   Neurological: Negative.   Psychiatric/Behavioral: Negative.      OBJECTIVE: BP 106/68 (BP Location: Right Arm, Patient Position: Sitting, Cuff Size: Large)   Pulse 100   Temp 98.1 F (36.7 C) (Oral)   Ht 6\' 2"  (1.88 m)   Wt 276 lb (125.2 kg)   SpO2 98%   BMI 35.44 kg/m   Wt Readings from Last 3 Encounters:  07/01/18 276 lb (125.2 kg)  05/06/18 281 lb (127.5 kg)  03/06/18 282 lb (127.9 kg)     Physical Exam Vitals signs and nursing note reviewed.  Constitutional:      General: He is not in acute distress.    Appearance: Normal appearance.  HENT:     Head: Normocephalic and atraumatic.  Eyes:     Extraocular Movements: Extraocular movements intact.     Conjunctiva/sclera: Conjunctivae normal.     Pupils: Pupils are equal, round, and reactive to light.  Cardiovascular:     Rate and Rhythm: Normal rate and regular rhythm.     Heart sounds: No murmur.  Pulmonary:     Effort: Pulmonary effort is normal.     Breath sounds: Normal breath sounds.  Chest:     Breasts:        Left: Tenderness present.    Abdominal:     Tenderness: There  is no right CVA tenderness or left CVA tenderness.  Musculoskeletal: Normal range of motion.  Skin:    General: Skin is warm and dry.  Neurological:     Mental Status: He is alert and oriented to person, place, and time.  Psychiatric:        Mood and Affect: Mood normal.        Behavior: Behavior normal.        Thought Content: Thought content normal.        Judgment: Judgment normal.     ASSESSMENT/PLAN:   1. Flank pain KUB ordered today. Will treat accordingly. Medication sent to the pharmacy on file.  - POCT urinalysis dipstick - DG Abd 1 View; Future - traMADol (ULTRAM) 50 MG tablet; Take 1 tablet (50 mg total) by mouth every 8 (eight) hours as needed.  Dispense: 15 tablet; Refill: 0  2. Mastitis in male Cellulitis in areola of left breast. Will treat with Bactrim DS x 7 days. If still ongoing, will order imaging for further evaluation.  - sulfamethoxazole-trimethoprim (BACTRIM DS,SEPTRA DS) 800-160 MG tablet; Take 1 tablet by mouth 2 (two) times daily for 7 days.  Dispense: 14 tablet; Refill: 0         The patient was given clear instructions to go to ER or  return to medical center if symptoms do not improve, worsen or new problems develop. The patient verbalized understanding and agreed with plan of care.   Ms. Freda Jackson. Riley Lam, FNP-BC [facility] [address] [place], [place] [address] [phone]     This note has been created with Dragon speech recognition software and smart phrase technology. Any transcriptional errors are unintentional.

## 2018-07-01 NOTE — Patient Instructions (Signed)
Mastitis  Mastitis is irritation and swelling (inflammation) in an area of the breast. It is often caused by an infection that occurs when germs (bacteria) enter the skin. This most often happens to breastfeeding mothers, but it can happen to other women too as well as some men. Follow these instructions at home: Medicines  Take over-the-counter and prescription medicines only as told by your doctor.  If you were prescribed an antibiotic medicine, take it as told by your doctor. Do not stop taking it even if you start to feel better. General instructions  Do not wear a tight or underwire bra. Wear a soft support bra.  Drink more fluids, especially if you have a fever.  Get plenty of rest. If you are breastfeeding:   Keep emptying your breasts by breastfeeding or by using a breast pump.  Keep your nipples clean and dry.  During breastfeeding, empty the first breast before going to the other breast. Use a breast pump if your baby is not emptying your breasts.  Massage your breasts during feeding or pumping as told by your doctor.  If told, put moist heat on the affected area of your breast right before breastfeeding or pumping. Use the heat source that your doctor tells you to use.  If told, put ice on the affected area of your breast right after breastfeeding or pumping: ? Put ice in a plastic bag. ? Place a towel between your skin and the bag. ? Leave the ice on for 20 minutes.  If you go back to work, pump your breasts while at work.  Avoid letting your breasts get overly filled with milk (engorged). Contact a doctor if:  You have pus-like fluid leaking from your breast.  You have a fever.  Your symptoms do not get better within 2 days. Get help right away if:  Your pain and swelling are getting worse.  Your pain is not helped by medicine.  You have a red line going from your breast toward your armpit. Summary  Mastitis is irritation and swelling in an area of  the breast.  If you were prescribed an antibiotic medicine, do not stop taking it even if you start to feel better.  Drink more fluids and get plenty of rest.  Contact a doctor if your symptoms do not get better within 2 days. This information is not intended to replace advice given to you by your health care provider. Make sure you discuss any questions you have with your health care provider. Document Released: 03/06/2009 Document Revised: 04/09/2016 Document Reviewed: 04/09/2016 Elsevier Interactive Patient Education  2019 Elsevier Inc. Flank Pain, Adult Flank pain is pain in your side. The flank is the area of your side between your upper belly (abdomen) and your back. The pain may occur over a short time (acute), or it may be long-term or come back often (chronic). It may be mild or very bad. Pain in this area can be caused by many different things. Follow these instructions at home:   Drink enough fluid to keep your pee (urine) clear or pale yellow.  Rest as told by your doctor.  Take over-the-counter and prescription medicines only as told by your doctor.  Keep a journal to keep track of: ? What has caused your flank pain. ? What has made it feel better.  Keep all follow-up visits as told by your doctor. This is important. Contact a doctor if:  Medicine does not help your pain.  You have new symptoms.  Your pain gets worse.  You have a fever.  Your symptoms last longer than 2-3 days.  You have trouble peeing.  You are peeing more often than normal. Get help right away if:  You have trouble breathing.  You are short of breath.  Your belly hurts, or it is swollen or red.  You feel sick to your stomach (nauseous).  You throw up (vomit).  You feel like you will pass out, or you do pass out (faint).  You have blood in your pee. Summary  Flank pain is pain in your side. The flank is the area of your side between your upper belly (abdomen) and your back.   Flank pain may occur over a short time (acute), or it may be long-term or come back often (chronic). It may be mild or very bad.  Pain in this area can be caused by many different things.  Contact your doctor if your symptoms get worse or they last longer than 2-3 days. This information is not intended to replace advice given to you by your health care provider. Make sure you discuss any questions you have with your health care provider. Document Released: 12/26/2007 Document Revised: 07/08/2016 Document Reviewed: 07/08/2016 Elsevier Interactive Patient Education  2019 [employer].

## 2018-07-02 ENCOUNTER — Other Ambulatory Visit: Payer: Self-pay | Admitting: Family Medicine

## 2018-07-02 MED ORDER — NAPROXEN 500 MG PO TABS: 500.0000 mg | ORAL_TABLET | Freq: Two times a day (BID) | ORAL | 0 refills | Status: DC

## 2018-07-02 MED FILL — traMADol HCL 50 MG TABS: 50 | 5 days supply | Qty: 15 | Fill #0

## 2018-07-02 MED FILL — ?SULFAMETHOXAZOLE-TMP DS TA: 800-160 | 7 days supply | Qty: 14 | Fill #0

## 2018-07-16 ENCOUNTER — Ambulatory Visit: Payer: Medicaid Other

## 2018-07-16 ENCOUNTER — Ambulatory Visit (INDEPENDENT_AMBULATORY_CARE_PROVIDER_SITE_OTHER): Payer: Medicaid Other | Admitting: Family Medicine

## 2018-07-16 ENCOUNTER — Other Ambulatory Visit: Payer: Self-pay | Admitting: Family Medicine

## 2018-07-16 ENCOUNTER — Other Ambulatory Visit: Payer: Self-pay

## 2018-07-16 ENCOUNTER — Encounter: Payer: Self-pay | Admitting: Family Medicine

## 2018-07-16 ENCOUNTER — Ambulatory Visit
Admission: RE | Admit: 2018-07-16 | Discharge: 2018-07-16 | Disposition: A | Discharge status: Home / Self Care | Payer: Medicaid Other | Source: Ambulatory Visit | Attending: Family Medicine | Admitting: Family Medicine

## 2018-07-16 VITALS — BP 108/72 | HR 84 | Temp 97.7°F | Ht 74.0 in | Wt 274.0 lb

## 2018-07-16 DIAGNOSIS — N63 Unspecified lump in unspecified breast: Secondary | ICD-10-CM

## 2018-07-16 NOTE — Progress Notes (Signed)
  [facility]   Progress Note: Sick Visit Provider: Mike Gip, FNP  SUBJECTIVE:   Micheal Neal is a 48 y.o. male who  has a past medical history of Diabetes mellitus without complication (HCC).. Patient presents today for Follow-up (Nipple pain) Patient presents for follow up on nipple pain. At the last visit, patient was given bactrim DS BID x 7 days for suspected mastitis. Patient states that he completed the antibiotic course and is having continued pain. He states that the pain is more severe with movement and sweating. He endorses a family history of breast cancer in a first degree relative.  Review of Systems  Constitutional: Negative.   HENT: Negative.   Respiratory: Negative.   Cardiovascular: Negative.   Gastrointestinal: Negative.   Genitourinary: Negative.   Musculoskeletal: Negative.   Skin: Positive for rash.       Breast pain of left side.   Neurological: Negative.   Psychiatric/Behavioral: Negative.      OBJECTIVE: BP 108/72 (BP Location: Left Arm, Patient Position: Sitting, Cuff Size: Large)   Pulse 84   Temp 97.7 F (36.5 C) (Oral)   Ht 6\' 2"  (1.88 m)   Wt 274 lb (124.3 kg)   SpO2 100%   BMI 35.18 kg/m   Wt Readings from Last 3 Encounters:  07/16/18 274 lb (124.3 kg)  07/01/18 276 lb (125.2 kg)  05/06/18 281 lb (127.5 kg)     Physical Exam Constitutional:      General: He is not in acute distress.    Appearance: Normal appearance.  HENT:     Head: Normocephalic and atraumatic.  Chest:     Breasts: Breasts are asymmetrical (left breast is slightly enlarged. ).        Right: Normal.        Left: Inverted nipple, mass and tenderness present.    Neurological:     Mental Status: He is alert.     ASSESSMENT/PLAN:   1. Breast mass in male Labs ordered. [place] and Mamm orders placed.  - US BREAST COMPLETE UNI LEFT INC AXILLA; Future - Thyroid Panel With TSH; Future - Testosterone; Future -  Luteinizing hormone; Future - Estrogens, Total; Future - MM DIAG BREAST TOMO BILATERAL; Future        The patient was given clear instructions to go to ER or return to medical center if symptoms do not improve, worsen or new problems develop. The patient verbalized understanding and agreed with plan of care.   Ms. Freda Jackson. Riley Lam, FNP-BC [facility] [address] [place], [place] [address] [phone]     This note has been created with Dragon speech recognition software and smart phrase technology. Any transcriptional errors are unintentional.

## 2018-07-16 NOTE — Patient Instructions (Signed)
Fibrocystic Breast Changes    Fibrocystic breast changes are changes that can make your breasts swollen or painful. These changes happen when tiny sacs of fluid (cysts) form in the breast. This is a common condition. It does not mean that you have cancer. It usually happens because of hormone changes during a monthly period.  Follow these instructions at home:   Check your breasts after every monthly period. If you do not have monthly periods, check your breasts on the first day of every month. Check for:  ? Soreness.  ? New swelling or puffiness.  ? A change in breast size.  ? A change in a lump that was already there.   Take over-the-counter and prescription medicines only as told by your doctor.   Wear a support or sports bra that fits well. Wear this support especially when you are exercising.   Avoid or have less caffeine, fat, and sugar in what you eat and drink as told by your doctor.  Contact a doctor if:   You have fluid coming from your nipple, especially if the fluid has blood in it.   You have new lumps or bumps in your breast.   Your breast gets puffy, red, and painful.   You have changes in how your breast looks.   Your nipple looks flat or it sinks into your breast.  Get help right away if:   Your breast turns red, and the redness is spreading.  Summary   Fibrocystic breast changes are changes that can make your breasts swollen or painful.   This condition can happen when you have hormone changes during your monthly period.   With this condition, it is important to check your breasts after every monthly period. If you do not have monthly periods, check your breasts on the first day of every month.  This information is not intended to replace advice given to you by your health care provider. Make sure you discuss any questions you have with your health care provider.  Document Released: 02/29/2008 Document Revised: 11/30/2015 Document Reviewed: 11/30/2015  Elsevier Interactive Patient  Education  2019 Elsevier Inc.

## 2018-07-17 MED FILL — LISINOPRIL 2.5 MG TABLET: 2.5 | 30 days supply | Qty: 30 | Fill #4

## 2018-07-17 MED FILL — glipiZIDE XL 10 MG TB24: 10 | 30 days supply | Qty: 30 | Fill #4

## 2018-07-20 ENCOUNTER — Other Ambulatory Visit: Payer: Self-pay

## 2018-07-20 ENCOUNTER — Other Ambulatory Visit: Payer: Medicaid Other

## 2018-07-20 DIAGNOSIS — N63 Unspecified lump in unspecified breast: Secondary | ICD-10-CM

## 2018-07-20 DIAGNOSIS — R7989 Other specified abnormal findings of blood chemistry: Secondary | ICD-10-CM

## 2018-07-22 LAB — THYROID PANEL WITH TSH
Free Thyroxine Index: 2 (ref 1.2–4.9)
T3 Uptake Ratio: 27 % (ref 24–39)
T4, Total: 7.4 ug/dL (ref 4.5–12.0)
TSH: 2.16 u[IU]/mL (ref 0.450–4.500)

## 2018-07-22 LAB — ESTROGENS, TOTAL: Estrogen: 121 pg/mL — ABNORMAL HIGH (ref 40–115)

## 2018-07-22 LAB — LUTEINIZING HORMONE: LH: 3.3 m[IU]/mL (ref 1.7–8.6)

## 2018-07-22 LAB — TESTOSTERONE: Testosterone: 242 ng/dL — ABNORMAL LOW (ref 264–916)

## 2018-08-05 ENCOUNTER — Ambulatory Visit: Payer: Medicaid Other | Admitting: Family Medicine

## 2018-08-10 ENCOUNTER — Ambulatory Visit: Payer: Medicaid Other | Admitting: Family Medicine

## 2018-08-11 ENCOUNTER — Telehealth: Payer: Self-pay

## 2018-08-11 NOTE — Telephone Encounter (Signed)
-----   Message from Mike Gip, FNP sent at 08/11/2018  9:32 AM EDT ----- The results of the breast ultrasound showed gynecomastia which is an abnormal growth of breast tissue in males. This can be caused by increased estrogen, weight gain and low testosterone. Your labs showed that you do have increased estrogen. This may be due to being overweight.  Your testosterone is low. I can refer you to urology for this.

## 2018-08-11 NOTE — Telephone Encounter (Signed)
Called and spoke with patient. Advised that breast ultrasound showed gynecomastia and explained to him that this is an abnormal growth of breast tissue in males. Advised that this could be caused by increased estrogen, weight gain, and low testosterone. Advised that labs do show an increase in estrogen and low testosterone. Advised that we will send in referral for urology for this. Patient verbalized understanding. Thanks!

## 2018-08-20 ENCOUNTER — Telehealth: Payer: Self-pay

## 2018-08-20 NOTE — Telephone Encounter (Signed)
Called to do COVID Screening for appointment tomorrow. No answer. Left a message to call back. Thanks! 

## 2018-08-21 ENCOUNTER — Encounter: Payer: Self-pay | Admitting: Family Medicine

## 2018-08-21 ENCOUNTER — Ambulatory Visit (INDEPENDENT_AMBULATORY_CARE_PROVIDER_SITE_OTHER): Payer: Self-pay | Admitting: Family Medicine

## 2018-08-21 ENCOUNTER — Other Ambulatory Visit: Payer: Self-pay

## 2018-08-21 DIAGNOSIS — E785 Hyperlipidemia, unspecified: Secondary | ICD-10-CM

## 2018-08-21 DIAGNOSIS — E1165 Type 2 diabetes mellitus with hyperglycemia: Secondary | ICD-10-CM

## 2018-08-21 MED ORDER — LISINOPRIL 2.5 MG PO TABS: 2.5000 mg | ORAL_TABLET | Freq: Every day | ORAL | 5 refills | Status: DC

## 2018-08-21 MED ORDER — ROSUVASTATIN CALCIUM 20 MG PO TABS: 20.0000 mg | ORAL_TABLET | Freq: Every day | ORAL | 1 refills | Status: DC

## 2018-08-21 MED ORDER — GLIPIZIDE ER 10 MG PO TB24: 10.0000 mg | ORAL_TABLET | Freq: Every day | ORAL | 2 refills | Status: DC

## 2018-08-21 MED FILL — glipiZIDE XL 10 MG TB24: 10 | 30 days supply | Qty: 30 | Fill #5

## 2018-08-21 MED FILL — ?ROSUVASTATIN CALCIUM 20MG: 20 | 30 days supply | Qty: 30 | Fill #1

## 2018-08-21 MED FILL — LISINOPRIL 2.5 MG TABLET: 2.5 | 30 days supply | Qty: 30 | Fill #5

## 2018-08-21 NOTE — Progress Notes (Signed)
  [facility]  Virtual Visit via Telephone Note  I connected with Micheal Neal on 08/21/18 at  8:40 AM EDT by telephone and verified that I am speaking with the correct person using two identifiers.   I discussed the limitations, risks, security and privacy concerns of performing an evaluation and management service by telephone and the availability of in person appointments. I also discussed with the patient that there may be a patient responsible charge related to this service. The patient expressed understanding and agreed to proceed.   History of Present Illness: Micheal Neal  has a past medical history of Diabetes mellitus without complication (HCC). Patient reports not taking Lantus due to hypoglycemia at night. He states that he is compliant with glipizide, lisinopril, and crestor. He is taking Humalog on a sliding scale before meals. Denies chest pain SOB, dizziness or leg swelling.    Observations/Objective: Patient with regular voice tone, rate and rhythm. Speaking calmly and is in no apparent distress.    Assessment and Plan: 1. Uncontrolled type 2 diabetes mellitus with hyperglycemia (HCC) - glipiZIDE (GLUCOTROL XL) 10 MG 24 hr tablet; Take 1 tablet (10 mg total) by mouth daily with breakfast.  Dispense: 90 tablet; Refill: 2 - lisinopril (ZESTRIL) 2.5 MG tablet; Take 1 tablet (2.5 mg total) by mouth daily.  Dispense: 30 tablet; Refill: 5  2. Hyperlipidemia, unspecified hyperlipidemia type - rosuvastatin (CRESTOR) 20 MG tablet; Take 1 tablet (20 mg total) by mouth daily.  Dispense: 90 tablet; Refill: 1    Follow Up Instructions:  We discussed hand washing, using hand sanitizer when soap and water are not available, only going out when absolutely necessary, and social distancing. Explained to patient that he is immunocompromised and will need to take precautions during this time.   I discussed the assessment and treatment plan  with the patient. The patient was provided an opportunity to ask questions and all were answered. The patient agreed with the plan and demonstrated an understanding of the instructions.   The patient was advised to call back or seek an in-person evaluation if the symptoms worsen or if the condition fails to improve as anticipated.  I provided 10 minutes of non-face-to-face time during this encounter.  Ms. Andr L. Riley Lam, FNP-BC [facility] [address] [place], [place] [address] [phone]

## 2018-10-05 MED FILL — ?ROSUVASTATIN CALCIUM 20MG: 20 | 30 days supply | Qty: 30 | Fill #2

## 2018-10-05 MED FILL — glipiZIDE XL 10 MG TB24: 10 | 30 days supply | Qty: 30 | Fill #6

## 2018-10-05 MED FILL — LISINOPRIL 2.5 MG TABLET: 2.5 | 30 days supply | Qty: 30 | Fill #0

## 2018-11-05 ENCOUNTER — Encounter: Payer: Self-pay | Admitting: Family Medicine

## 2018-11-05 ENCOUNTER — Ambulatory Visit (INDEPENDENT_AMBULATORY_CARE_PROVIDER_SITE_OTHER): Payer: Self-pay | Admitting: Family Medicine

## 2018-11-05 ENCOUNTER — Other Ambulatory Visit: Payer: Self-pay

## 2018-11-05 VITALS — BP 90/64 | HR 83 | Temp 98.6°F | Resp 16 | Ht 74.0 in | Wt 269.0 lb

## 2018-11-05 DIAGNOSIS — E1165 Type 2 diabetes mellitus with hyperglycemia: Secondary | ICD-10-CM

## 2018-11-05 DIAGNOSIS — B351 Tinea unguium: Secondary | ICD-10-CM

## 2018-11-05 NOTE — Progress Notes (Signed)
  [name]   Progress Note: Sick Visit Provider: Lanae Boast, FNP  SUBJECTIVE:   Micheal Neal is a 48 y.o. male who  has a past medical history of Diabetes mellitus without complication ([place]).. Patient presents today for Nail Problem (toe nails falling off )  Patient states that his toenail repeatedly "fall off" after they turn black and blue. He reports having numbness in his toes which he contributes to his uncontrolled diabetes.He states that his feet hurt with walking. Shoes are non-contributory to the pain. Also states that he has noticed swelling in the left foot. Has tried otc medications and shoe inserts without relief.  Denies specific injury to the bilateral feet Review of Systems  Musculoskeletal: Positive for joint pain (Bilateral feet).  Neurological: Positive for tingling (Bilateral feet).  All other systems reviewed and are negative.    OBJECTIVE: BP 90/64 (BP Location: Left Arm, Patient Position: Sitting, Cuff Size: Large)   Pulse 83   Temp 98.6 F (37 C) (Oral)   Resp 16   Ht 6\' 2"  (1.88 m)   Wt 269 lb (122 kg)   SpO2 100%   BMI 34.54 kg/m   Wt Readings from Last 3 Encounters:  11/05/18 269 lb (122 kg)  07/16/18 274 lb (124.3 kg)  07/01/18 276 lb (125.2 kg)     Physical Exam Vitals signs and nursing note reviewed.  Constitutional:      General: He is not in acute distress.    Appearance: Normal appearance.  HENT:     Head: Normocephalic and atraumatic.  Feet:     Right foot:     Protective Sensation: 10 sites tested. 2 sites sensed.     Toenail Condition: Right toenails are abnormally thick. Fungal disease present.    Left foot:     Protective Sensation: 10 sites tested. 3 sites sensed.     Toenail Condition: Left toenails are abnormally thick and ingrown. Fungal disease present. Neurological:     Mental Status: He is alert.     ASSESSMENT/PLAN:   1. Uncontrolled type 2 diabetes mellitus  with hyperglycemia (HCC) Discussed diet and exercise to help with control.  No medication changes warranted at the present time.  We will continue to monitor. - Ambulatory referral to Podiatry  2. Onychomycosis Refer to podiatry for further evaluation and treatment.  No absent toenails at this encounter. - Ambulatory referral to Podiatry       The patient was given clear instructions to go to ER or return to medical center if symptoms do not improve, worsen or new problems develop. The patient verbalized understanding and agreed with plan of care.   Ms. Doug Sou. Nathaneil Canary, FNP-BC [facility] [address] [place], [place] [address] [phone]     This note has been created with Dragon speech recognition software and smart phrase technology. Any transcriptional errors are unintentional.

## 2018-11-05 NOTE — Patient Instructions (Signed)
Fungal Nail Infection A fungal nail infection is a common infection of the toenails or fingernails. This condition affects toenails more often than fingernails. It often affects the great, or big, toes. More than one nail may be infected. The condition can be passed from person to person (is contagious). What are the causes? This condition is caused by a fungus. Several types of fungi can cause the infection. These fungi are common in moist and warm areas. If your hands or feet come into contact with the fungus, it may get into a crack in your fingernail or toenail and cause the infection. What increases the risk? The following factors may make you more likely to develop this condition:  Being male.  Being of older age.  Living with someone who has the fungus.  Walking barefoot in areas where the fungus thrives, such as showers or locker rooms.  Wearing shoes and socks that cause your feet to sweat.  Having a nail injury or a recent nail surgery.  Having certain medical conditions, such as: ? Athlete's foot. ? Diabetes. ? Psoriasis. ? Poor circulation. ? A weak body defense system (immune system). What are the signs or symptoms? Symptoms of this condition include:  A pale spot on the nail.  Thickening of the nail.  A nail that becomes yellow or brown.  A brittle or ragged nail edge.  A crumbling nail.  A nail that has lifted away from the nail bed. How is this diagnosed? This condition is diagnosed with a physical exam. Your health care provider may take a scraping or clipping from your nail to test for the fungus. How is this treated? Treatment is not needed for mild infections. If you have significant nail changes, treatment may include:  Antifungal medicines taken by mouth (orally). You may need to take the medicine for several weeks or several months, and you may not see the results for a long time. These medicines can cause side effects. Ask your health care provider  what problems to watch for.  Antifungal nail polish or nail cream. These may be used along with oral antifungal medicines.  Laser treatment of the nail.  Surgery to remove the nail. This may be needed for the most severe infections. It can take a long time, usually up to a year, for the infection to go away. The infection may also come back. Follow these instructions at home: Medicines  Take or apply over-the-counter and prescription medicines only as told by your health care provider.  Ask your health care provider about using over-the-counter mentholated ointment on your nails. Nail care  Trim your nails often.  Wash and dry your hands and feet every day.  Keep your feet dry: ? Wear absorbent socks, and change your socks frequently. ? Wear shoes that allow air to circulate, such as sandals or canvas tennis shoes. Throw out old shoes.  Do not use artificial nails.  If you go to a nail salon, make sure you choose one that uses clean instruments.  Use antifungal foot powder on your feet and in your shoes. General instructions  Do not share personal items, such as towels or nail clippers.  Do not walk barefoot in shower rooms or locker rooms.  Wear rubber gloves if you are working with your hands in wet areas.  Keep all follow-up visits as told by your health care provider. This is important. Contact a health care provider if: Your infection is not getting better or it is getting worse   after several months. Summary  A fungal nail infection is a common infection of the toenails or fingernails.  Treatment is not needed for mild infections. If you have significant nail changes, treatment may include taking medicine orally and applying medicine to your nails.  It can take a long time, usually up to a year, for the infection to go away. The infection may also come back.  Take or apply over-the-counter and prescription medicines only as told by your health care provider.   Follow instructions for taking care of your nails to help prevent infection from coming back or spreading. This information is not intended to replace advice given to you by your health care provider. Make sure you discuss any questions you have with your health care provider. Document Released: 03/15/2000 Document Revised: 07/09/2018 Document Reviewed: 08/22/2017 Elsevier Patient Education  2020 Elsevier Inc.  

## 2018-11-09 MED FILL — ?ROSUVASTATIN CALCIUM 20MG: 20 | 30 days supply | Qty: 30 | Fill #3

## 2018-11-09 MED FILL — LISINOPRIL 2.5 MG TABLET: 2.5 | 30 days supply | Qty: 30 | Fill #1

## 2018-11-10 MED FILL — glipiZIDE XL 10 MG TB24: 10 | 30 days supply | Qty: 30 | Fill #7

## 2018-11-13 ENCOUNTER — Emergency Department (HOSPITAL_COMMUNITY): Payer: Self-pay

## 2018-11-13 ENCOUNTER — Encounter (HOSPITAL_COMMUNITY): Payer: Self-pay

## 2018-11-13 ENCOUNTER — Other Ambulatory Visit: Payer: Self-pay

## 2018-11-13 ENCOUNTER — Emergency Department (HOSPITAL_COMMUNITY)
Admission: EM | Admit: 2018-11-13 | Discharge: 2018-11-13 | Disposition: A | Discharge status: Home / Self Care | Payer: Self-pay | Attending: Emergency Medicine | Admitting: Emergency Medicine

## 2018-11-13 DIAGNOSIS — Z5321 Procedure and treatment not carried out due to patient leaving prior to being seen by health care provider: Secondary | ICD-10-CM | POA: Insufficient documentation

## 2018-11-13 DIAGNOSIS — M79672 Pain in left foot: Secondary | ICD-10-CM | POA: Insufficient documentation

## 2018-11-13 LAB — CBC WITH DIFFERENTIAL/PLATELET
Abs Immature Granulocytes: 0.02 10*3/uL (ref 0.00–0.07)
Basophils Absolute: 0 10*3/uL (ref 0.0–0.1)
Basophils Relative: 0 %
Eosinophils Absolute: 0.1 10*3/uL (ref 0.0–0.5)
Eosinophils Relative: 1 %
HCT: 39.3 % (ref 39.0–52.0)
Hemoglobin: 13 g/dL (ref 13.0–17.0)
Immature Granulocytes: 0 %
Lymphocytes Relative: 23 %
Lymphs Abs: 1.7 10*3/uL (ref 0.7–4.0)
MCH: 28.3 pg (ref 26.0–34.0)
MCHC: 33.1 g/dL (ref 30.0–36.0)
MCV: 85.4 fL (ref 80.0–100.0)
Monocytes Absolute: 0.3 10*3/uL (ref 0.1–1.0)
Monocytes Relative: 5 %
Neutro Abs: 5.3 10*3/uL (ref 1.7–7.7)
Neutrophils Relative %: 71 %
Platelets: 234 10*3/uL (ref 150–400)
RBC: 4.6 MIL/uL (ref 4.22–5.81)
RDW: 12.2 % (ref 11.5–15.5)
WBC: 7.5 10*3/uL (ref 4.0–10.5)
nRBC: 0 % (ref 0.0–0.2)

## 2018-11-13 LAB — COMPREHENSIVE METABOLIC PANEL
ALT: 24 U/L (ref 0–44)
AST: 19 U/L (ref 15–41)
Albumin: 3.7 g/dL (ref 3.5–5.0)
Alkaline Phosphatase: 96 U/L (ref 38–126)
Anion gap: 8 (ref 5–15)
BUN: 19 mg/dL (ref 6–20)
CO2: 26 mmol/L (ref 22–32)
Calcium: 9.1 mg/dL (ref 8.9–10.3)
Chloride: 98 mmol/L (ref 98–111)
Creatinine, Ser: 1.03 mg/dL (ref 0.61–1.24)
GFR calc Af Amer: >60 mL/min (ref >60)
GFR calc non Af Amer: >60 mL/min (ref >60)
Glucose, Bld: 468 mg/dL — ABNORMAL HIGH (ref 70–99)
Potassium: 4.3 mmol/L (ref 3.5–5.1)
Sodium: 132 mmol/L — ABNORMAL LOW (ref 135–145)
Total Bilirubin: 0.8 mg/dL (ref 0.3–1.2)
Total Protein: 7.7 g/dL (ref 6.5–8.1)

## 2018-11-13 NOTE — ED Triage Notes (Signed)
Pt reports 2 months of left arm pain and weakness and 1 week of leg foot swelling/pain. Hx of diabetes, taking insulin. No redness or wound noted to foot. Pt a.o

## 2018-11-13 NOTE — ED Notes (Signed)
Pt states he has been here five hours and is leaving AMA

## 2018-11-13 NOTE — ED Notes (Signed)
No answer for vitals recheck x1 

## 2018-11-23 ENCOUNTER — Ambulatory Visit (INDEPENDENT_AMBULATORY_CARE_PROVIDER_SITE_OTHER): Payer: Self-pay | Admitting: Family Medicine

## 2018-11-23 ENCOUNTER — Ambulatory Visit (HOSPITAL_COMMUNITY): Admission: RE | Admit: 2018-11-23 | Payer: Medicaid Other | Source: Ambulatory Visit

## 2018-11-23 ENCOUNTER — Other Ambulatory Visit: Payer: Self-pay

## 2018-11-23 ENCOUNTER — Encounter: Payer: Self-pay | Admitting: Family Medicine

## 2018-11-23 VITALS — BP 89/59 | HR 88 | Temp 98.7°F | Resp 14 | Ht 74.0 in | Wt 267.0 lb

## 2018-11-23 DIAGNOSIS — E1165 Type 2 diabetes mellitus with hyperglycemia: Secondary | ICD-10-CM

## 2018-11-23 DIAGNOSIS — E871 Hypo-osmolality and hyponatremia: Secondary | ICD-10-CM

## 2018-11-23 DIAGNOSIS — E860 Dehydration: Secondary | ICD-10-CM

## 2018-11-23 LAB — POCT GLYCOSYLATED HEMOGLOBIN (HGB A1C): Hemoglobin A1C: 10.8 % — AB (ref 4.0–5.6)

## 2018-11-23 LAB — POCT URINALYSIS DIPSTICK
Bilirubin, UA: NEGATIVE
Blood, UA: NEGATIVE
Glucose, UA: POSITIVE — AB
Ketones, UA: NEGATIVE
Leukocytes, UA: NEGATIVE
Nitrite, UA: NEGATIVE
Protein, UA: NEGATIVE
Spec Grav, UA: 1.025 (ref 1.010–1.025)
Urobilinogen, UA: 0.2 E.U./dL
pH, UA: 5 (ref 5.0–8.0)

## 2018-11-23 LAB — GLUCOSE, POCT (MANUAL RESULT ENTRY): POC Glucose: 295 mg/dl — AB (ref 70–99)

## 2018-11-23 MED ORDER — INSULIN LISPRO 100 UNIT/ML ~~LOC~~ SOLN
10.0000 [IU] | Freq: Once | SUBCUTANEOUS | Status: AC
  Administered 2018-11-23: 10 [IU] via SUBCUTANEOUS

## 2018-11-23 NOTE — Progress Notes (Signed)
[facility]   Progress Note: General Provider: Mike GipAndre Melodie Ashworth, FNP  SUBJECTIVE:   Micheal Neal is a 48 y.o. male who  has a past medical history of Diabetes mellitus without complication (HCC).. Patient presents today for Diabetes  Pt seen in the ED for left 2nd toe discoloration on 11/13/2018. He had xrays and left AMA due to long wait. Xray negative. CMP showed hyponatremia on 11/13/2018. Today, his BP is low. FBS is 295. States that he took his medications last night. He denies CP, SOB, leg swelling or dizziness. + fatigue.   Review of Systems  Constitutional: Positive for malaise/fatigue.  HENT: Negative.   Eyes: Negative.   Respiratory: Negative.   Cardiovascular: Negative.   Gastrointestinal: Negative.   Genitourinary: Negative.   Musculoskeletal: Negative.   Skin: Negative.   Neurological: Negative.   Psychiatric/Behavioral: Negative.      OBJECTIVE: BP (!) 89/59 (BP Location: Left Arm, Patient Position: Sitting, Cuff Size: Large)   Pulse 88   Temp 98.7 F (37.1 C) (Oral)   Resp 14   Ht 6\' 2"  (1.88 m)   Wt 267 lb (121.1 kg)   SpO2 100%   BMI 34.28 kg/m   Wt Readings from Last 3 Encounters:  11/23/18 267 lb (121.1 kg)  11/05/18 269 lb (122 kg)  07/16/18 274 lb (124.3 kg)     Physical Exam Vitals signs and nursing note reviewed.  Constitutional:      General: He is not in acute distress.    Appearance: He is well-developed.  HENT:     Head: Normocephalic and atraumatic.  Eyes:     Conjunctiva/sclera: Conjunctivae normal.     Pupils: Pupils are equal, round, and reactive to light.  Neck:     Musculoskeletal: Normal range of motion.  Cardiovascular:     Rate and Rhythm: Normal rate and regular rhythm.     Heart sounds: Normal heart sounds.  Pulmonary:     Effort: Pulmonary effort is normal. No respiratory distress.     Breath sounds: Normal breath sounds.  Abdominal:     General: Bowel sounds are normal.  There is no distension.     Palpations: Abdomen is soft.  Musculoskeletal: Normal range of motion.  Skin:    General: Skin is warm and dry.  Neurological:     Mental Status: He is alert and oriented to person, place, and time.  Psychiatric:        Behavior: Behavior normal.        Thought Content: Thought content normal.     ASSESSMENT/PLAN:   1. Uncontrolled type 2 diabetes mellitus with hyperglycemia (HCC) Patient has not been taking medications as prescribed. He states that he stopped the humalog TID. Stopped Lantus after last DM follow up visit 3 months ago. He was instructed to take all medications as prescribed. Will repeat A1c in 3 months.  - HgB A1c- increased from 7 to 10.6  - Urinalysis Dipstick - Glucose (CBG) - insulin lispro (HUMALOG) injection 10 Units  2. Hyponatremia - 0.9 %  sodium chloride infusion  3. Dehydration - 0.9 %  sodium chloride infusion  Patient advised to return in the AM for normal saline IV infusion. Patient advised to increase fluids and rest today. He is to take medications as previously prescribed.   Return in about 3 months (around 02/23/2019), or tomorrow for fluids, for DM.    The patient was given clear instructions to go to ER or  return to medical center if symptoms do not improve, worsen or new problems develop. The patient verbalized understanding and agreed with plan of care.   Ms. Doug Sou. Nathaneil Canary, FNP-BC Patient [facility] [address] [place], [place] [address] [phone]

## 2018-11-23 NOTE — Patient Instructions (Signed)
Hyponatremia Hyponatremia is when the amount of salt (sodium) in your blood is too low. When salt levels are low, your body may take in extra water. This can cause swelling throughout the body. The swelling often affects the brain. What are the causes? This condition may be caused by:  Certain medical problems or conditions.  Vomiting a lot.  Having watery poop (diarrhea) often.  Certain medicines or illegal drugs.  Not having enough water in the body (dehydration).  Drinking too much water.  Eating a diet that is low in salt.  Large burns on your body.  Too much sweating. What increases the risk? You are more likely to get this condition if you:  Have long-term (chronic) kidney disease.  Have heart failure.  Have a medical condition that causes you to have watery poop often.  Do very hard exercises.  Take medicines that affect the amount of salt is in your blood. What are the signs or symptoms? Symptoms of this condition include:  Headache.  Feeling like you may vomit (nausea).  Vomiting.  Being very tired (lethargic).  Muscle weakness and cramps.  Not wanting to eat as much as normal (loss of appetite).  Feeling weak or light-headed. Severe symptoms of this condition include:  Confusion.  Feeling restless (agitation).  Having a fast heart rate.  Passing out (fainting).  Seizures.  Coma. How is this treated? Treatment for this condition depends on the cause. Treatment may include:  Getting fluids through an IV tube that is put into one of your veins.  Taking medicines to fix the salt levels in your blood. If medicines are causing the problem, your medicines will need to be changed.  Limiting how much water or fluid you take in.  Monitoring in the hospital to watch your symptoms. Follow these instructions at home:   Take over-the-counter and prescription medicines only as told by your doctor. Many medicines can make this condition worse.  Talk with your doctor about any medicines that you are taking.  Eat and drink exactly as you are told by your doctor. ? Eat only the foods you are told to eat. ? Limit how much fluid you take.  Do not drink alcohol.  Keep all follow-up visits as told by your doctor. This is important. Contact a doctor if:  You feel more like you may vomit.  You feel more tired.  Your headache gets worse.  You feel more confused.  You feel weaker.  Your symptoms go away and then they come back.  You have trouble following the diet instructions. Get help right away if:  You have a seizure.  You pass out.  You keep having watery poop.  You keep vomiting. Summary  Hyponatremia is when the amount of salt in your blood is too low.  When salt levels are low, you can have swelling throughout the body. The swelling mostly affects the brain.  Treatment depends on the cause. Treatment may include getting IV fluids, medicines, or not drinking as much fluid. This information is not intended to replace advice given to you by your health care provider. Make sure you discuss any questions you have with your health care provider. Document Released: 11/28/2010 Document Revised: 06/04/2018 Document Reviewed: 02/19/2018 Elsevier Patient Education  2020 [employer] Dehydration, Adult  Dehydration is when there is not enough fluid or water in your body. This happens when you lose more fluids than you take in. Dehydration can range from mild to very bad. It should  be treated right away to keep it from getting very bad. Symptoms of mild dehydration may include:  Thirst.  Dry lips.  Slightly dry mouth.  Dry, warm skin.  Dizziness. Symptoms of moderate dehydration may include:  Very dry mouth.  Muscle cramps.  Dark pee (urine). Pee may be the color of tea.  Your body making less pee.  Your eyes making fewer tears.  Heartbeat that is uneven or faster than normal (palpitations).   Headache.  Light-headedness, especially when you stand up from sitting.  Fainting (syncope). Symptoms of very bad dehydration may include:  Changes in skin, such as: ? Cold and clammy skin. ? Blotchy (mottled) or pale skin. ? Skin that does not quickly return to normal after being lightly pinched and let go (poor skin turgor).  Changes in body fluids, such as: ? Feeling very thirsty. ? Your eyes making fewer tears. ? Not sweating when body temperature is high, such as in hot weather. ? Your body making very little pee.  Changes in vital signs, such as: ? Weak pulse. ? Pulse that is more than 100 beats a minute when you are sitting still. ? Fast breathing. ? Low blood pressure.  Other changes, such as: ? Sunken eyes. ? Cold hands and feet. ? Confusion. ? Lack of energy (lethargy). ? Trouble waking up from sleep. ? Short-term weight loss. ? Unconsciousness. Follow these instructions at home:   If told by your doctor, drink an ORS: ? Make an ORS by using instructions on the package. ? Start by drinking small amounts, about  cup (120 mL) every 5-10 minutes. ? Slowly drink more until you have had the amount that your doctor said to have.  Drink enough clear fluid to keep your pee clear or pale yellow. If you were told to drink an ORS, finish the ORS first, then start slowly drinking clear fluids. Drink fluids such as: ? Water. Do not drink only water by itself. Doing that can make the salt (sodium) level in your body get too low (hyponatremia). ? Ice chips. ? Fruit juice that you have added water to (diluted). ? Low-calorie sports drinks.  Avoid: ? Alcohol. ? Drinks that have a lot of sugar. These include high-calorie sports drinks, fruit juice that does not have water added, and soda. ? Caffeine. ? Foods that are greasy or have a lot of fat or sugar.  Take over-the-counter and prescription medicines only as told by your doctor.  Do not take salt tablets. Doing that  can make the salt level in your body get too high (hypernatremia).  Eat foods that have minerals (electrolytes). Examples include bananas, oranges, potatoes, tomatoes, and spinach.  Keep all follow-up visits as told by your doctor. This is important. Contact a doctor if:  You have belly (abdominal) pain that: ? Gets worse. ? Stays in one area (localizes).  You have a rash.  You have a stiff neck.  You get angry or annoyed more easily than normal (irritability).  You are more sleepy than normal.  You have a harder time waking up than normal.  You feel: ? Weak. ? Dizzy. ? Very thirsty.  You have peed (urinated) only a small amount of very dark pee during 6-8 hours. Get help right away if:  You have symptoms of very bad dehydration.  You cannot drink fluids without throwing up (vomiting).  Your symptoms get worse with treatment.  You have a fever.  You have a very bad headache.  You are  throwing up or having watery poop (diarrhea) and it: ? Gets worse. ? Does not go away.  You have blood or something green (bile) in your throw-up.  You have blood in your poop (stool). This may cause poop to look black and tarry.  You have not peed in 6-8 hours.  You pass out (faint).  Your heart rate when you are sitting still is more than 100 beats a minute.  You have trouble breathing. This information is not intended to replace advice given to you by your health care provider. Make sure you discuss any questions you have with your health care provider. Document Released: 01/12/2009 Document Revised: 02/28/2017 Document Reviewed: 05/12/2015 Elsevier Patient Education  2020 [employer]

## 2018-11-24 ENCOUNTER — Ambulatory Visit (HOSPITAL_COMMUNITY)
Admission: RE | Admit: 2018-11-24 | Discharge: 2018-11-24 | Disposition: A | Discharge status: Home / Self Care | Payer: Medicaid Other | Source: Ambulatory Visit | Attending: Family Medicine | Admitting: Family Medicine

## 2018-11-24 DIAGNOSIS — E860 Dehydration: Secondary | ICD-10-CM | POA: Insufficient documentation

## 2018-11-24 DIAGNOSIS — E871 Hypo-osmolality and hyponatremia: Secondary | ICD-10-CM | POA: Insufficient documentation

## 2018-11-24 MED ORDER — SODIUM CHLORIDE 0.9 % IV SOLN
Freq: Once | INTRAVENOUS | Status: AC
  Administered 2018-11-24: 10:00:00 via INTRAVENOUS

## 2018-11-24 NOTE — Discharge Instructions (Signed)
Dehydration, Adult  Dehydration is when there is not enough fluid or water in your body. This happens when you lose more fluids than you take in. Dehydration can range from mild to very bad. It should be treated right away to keep it from getting very bad. Symptoms of mild dehydration may include:  Thirst.  Dry lips.  Slightly dry mouth.  Dry, warm skin.  Dizziness. Symptoms of moderate dehydration may include:  Very dry mouth.  Muscle cramps.  Dark pee (urine). Pee may be the color of tea.  Your body making less pee.  Your eyes making fewer tears.  Heartbeat that is uneven or faster than normal (palpitations).  Headache.  Light-headedness, especially when you stand up from sitting.  Fainting (syncope). Symptoms of very bad dehydration may include:  Changes in skin, such as: ? Cold and clammy skin. ? Blotchy (mottled) or pale skin. ? Skin that does not quickly return to normal after being lightly pinched and let go (poor skin turgor).  Changes in body fluids, such as: ? Feeling very thirsty. ? Your eyes making fewer tears. ? Not sweating when body temperature is high, such as in hot weather. ? Your body making very little pee.  Changes in vital signs, such as: ? Weak pulse. ? Pulse that is more than 100 beats a minute when you are sitting still. ? Fast breathing. ? Low blood pressure.  Other changes, such as: ? Sunken eyes. ? Cold hands and feet. ? Confusion. ? Lack of energy (lethargy). ? Trouble waking up from sleep. ? Short-term weight loss. ? Unconsciousness. Follow these instructions at home:   If told by your doctor, drink an ORS: ? Make an ORS by using instructions on the package. ? Start by drinking small amounts, about  cup (120 mL) every 5-10 minutes. ? Slowly drink more until you have had the amount that your doctor said to have.  Drink enough clear fluid to keep your pee clear or pale yellow. If you were told to drink an ORS, finish the  ORS first, then start slowly drinking clear fluids. Drink fluids such as: ? Water. Do not drink only water by itself. Doing that can make the salt (sodium) level in your body get too low (hyponatremia). ? Ice chips. ? Fruit juice that you have added water to (diluted). ? Low-calorie sports drinks.  Avoid: ? Alcohol. ? Drinks that have a lot of sugar. These include high-calorie sports drinks, fruit juice that does not have water added, and soda. ? Caffeine. ? Foods that are greasy or have a lot of fat or sugar.  Take over-the-counter and prescription medicines only as told by your doctor.  Do not take salt tablets. Doing that can make the salt level in your body get too high (hypernatremia).  Eat foods that have minerals (electrolytes). Examples include bananas, oranges, potatoes, tomatoes, and spinach.  Keep all follow-up visits as told by your doctor. This is important. Contact a doctor if:  You have belly (abdominal) pain that: ? Gets worse. ? Stays in one area (localizes).  You have a rash.  You have a stiff neck.  You get angry or annoyed more easily than normal (irritability).  You are more sleepy than normal.  You have a harder time waking up than normal.  You feel: ? Weak. ? Dizzy. ? Very thirsty.  You have peed (urinated) only a small amount of very dark pee during 6-8 hours. Get help right away if:  You have   symptoms of very bad dehydration.  You cannot drink fluids without throwing up (vomiting).  Your symptoms get worse with treatment.  You have a fever.  You have a very bad headache.  You are throwing up or having watery poop (diarrhea) and it: ? Gets worse. ? Does not go away.  You have blood or something green (bile) in your throw-up.  You have blood in your poop (stool). This may cause poop to look black and tarry.  You have not peed in 6-8 hours.  You pass out (faint).  Your heart rate when you are sitting still is more than 100 beats a  minute.  You have trouble breathing. This information is not intended to replace advice given to you by your health care provider. Make sure you discuss any questions you have with your health care provider. Document Released: 01/12/2009 Document Revised: 02/28/2017 Document Reviewed: 05/12/2015 Elsevier Patient Education  2020 [employer] Inc[employer]  

## 2018-11-24 NOTE — Progress Notes (Signed)
Patient received 1 Liter 0.9 % normal saline via PIV. Tolerated well, vitals stable, discharge instructions given, verbalized understanding. Patient alert, oriented and ambulatory at the time of discharge.

## 2018-12-01 ENCOUNTER — Telehealth: Payer: Self-pay

## 2018-12-01 NOTE — Telephone Encounter (Signed)
Called patient he states he is feeling sluggish he states blood sugars are running in the high 200's. I advised him he needs to continue to take insulin as directed every day. Cut back on cabs in diet and if still not feeling well to make an appointment. Patient denies vision changes or dizziness. He was advised if anything got worse or blood sugar went higher to go to er.

## 2018-12-09 ENCOUNTER — Encounter (HOSPITAL_COMMUNITY): Payer: Self-pay | Admitting: *Deleted

## 2018-12-09 ENCOUNTER — Encounter (HOSPITAL_COMMUNITY): Payer: Self-pay

## 2018-12-21 MED FILL — LISINOPRIL 2.5 MG TABLET: 2.5 | 30 days supply | Qty: 30 | Fill #2

## 2018-12-21 MED FILL — glipiZIDE XL 10 MG TB24: 10 | 30 days supply | Qty: 30 | Fill #8

## 2018-12-21 MED FILL — ROSUVASTATIN CALCIUM 20 MG: 20 | 30 days supply | Qty: 30 | Fill #4

## 2019-01-05 ENCOUNTER — Other Ambulatory Visit: Payer: Self-pay

## 2019-01-05 ENCOUNTER — Ambulatory Visit: Payer: No Typology Code available for payment source | Admitting: Podiatry

## 2019-01-05 DIAGNOSIS — E1165 Type 2 diabetes mellitus with hyperglycemia: Secondary | ICD-10-CM

## 2019-01-05 DIAGNOSIS — M79674 Pain in right toe(s): Secondary | ICD-10-CM

## 2019-01-05 DIAGNOSIS — B351 Tinea unguium: Secondary | ICD-10-CM

## 2019-01-05 DIAGNOSIS — M79675 Pain in left toe(s): Secondary | ICD-10-CM

## 2019-01-06 ENCOUNTER — Encounter: Payer: Self-pay | Admitting: Podiatry

## 2019-01-06 NOTE — Progress Notes (Signed)
  Subjective:  Patient ID: Micheal Neal, male    DOB: May 23, 1970,  MRN: [id number]  Chief Complaint  Patient presents with  . Foot Pain    pt is here for diabetic foot care, pt is also here for nail trim.   48 y.o. male returns for the above complaint. He states that he has not seen other doctors. He states that his mycotic toenails hurt.  He does not have diabetic shoes.  Objective:  There were no vitals filed for this visit. Podiatric Exam: Vascular: dorsalis pedis and posterior tibial pulses are palpable bilateral. Capillary return is immediate. Temperature gradient is WNL. Skin turgor WNL  Sensorium: Normal Semmes Weinstein monofilament test. Normal tactile sensation bilaterally. Nail Exam: Pt has thick disfigured discolored nails with subungual debris noted bilateral entire nail hallux through fifth toenails Ulcer Exam: There is no evidence of ulcer or pre-ulcerative changes or infection. Orthopedic Exam: Muscle tone and strength are WNL. No limitations in general ROM. No crepitus or effusions noted. HAV  B/L.  Hammer toes 2-5  B/L. Skin: No Porokeratosis. No infection or ulcers  Assessment & Plan:  Patient was evaluated and treated and all questions answered.  Onychomycosis with pain  -Nails palliatively debrided as below. -Educated on self-care  Procedure: Nail Debridement Rationale: pain  Type of Debridement: manual, sharp debridement. Instrumentation: Nail nipper, rotary burr. Number of Nails: 10  Procedures and Treatment: Consent by patient was obtained for treatment procedures. The patient understood the discussion of treatment and procedures well. All questions were answered thoroughly reviewed. Debridement of mycotic and hypertrophic toenails, 1 through 5 bilateral and clearing of subungual debris. No ulceration, no infection noted.  Return Visit-Office Procedure: Patient instructed to return to the office for a follow up visit 3 months for continued evaluation and  treatment.  Boneta Lucks, DPM    No follow-ups on file.

## 2019-01-18 MED FILL — glipiZIDE XL 10 MG TB24: 10 | 30 days supply | Qty: 30 | Fill #0

## 2019-01-18 MED FILL — LISINOPRIL 2.5 MG TABLET: 2.5 | 30 days supply | Qty: 30 | Fill #3

## 2019-01-18 MED FILL — ROSUVASTATIN CALCIUM 20 MG: 20 | 30 days supply | Qty: 30 | Fill #5

## 2019-02-03 ENCOUNTER — Other Ambulatory Visit: Payer: Self-pay

## 2019-02-03 ENCOUNTER — Ambulatory Visit: Payer: Self-pay | Admitting: Podiatry

## 2019-02-03 DIAGNOSIS — M79672 Pain in left foot: Secondary | ICD-10-CM

## 2019-02-03 DIAGNOSIS — E1165 Type 2 diabetes mellitus with hyperglycemia: Secondary | ICD-10-CM

## 2019-02-03 DIAGNOSIS — M76822 Posterior tibial tendinitis, left leg: Secondary | ICD-10-CM

## 2019-02-06 ENCOUNTER — Encounter: Payer: Self-pay | Admitting: Podiatry

## 2019-02-06 NOTE — Progress Notes (Signed)
Subjective:  Patient ID: Micheal Neal, male    DOB: 1971-02-23,  MRN: [id number]  Chief Complaint  Patient presents with  . Foot Pain    pt is here for diabetic foot care as well as a nail trim, pt states that the swelling from his left foot has not gone down, pt also states that once he takes the cam boot off, he will start to feel pain again    48 y.o. male presents with the above complaint.  Patient is here today for left posterior tibial tendon nidus.  He was last seen by me for diabetic routine foot care and debridement of his nails for onychomycosis.  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  Patient has been ambulating with a cam boot on the left side which has helped dramatically help the swelling and the associated pain with it.  He denies any other acute complaints.   Review of Systems: Negative except as noted in the HPI. Denies N/V/F/Ch.  Past Medical History:  Diagnosis Date  . Diabetes mellitus without complication (HCC)     Current Outpatient Medications:  .  aspirin EC 81 MG tablet, Take 1 tablet (81 mg total) by mouth daily., Disp: 30 tablet, Rfl: 11 .  glipiZIDE (GLUCOTROL XL) 10 MG 24 hr tablet, Take 1 tablet (10 mg total) by mouth daily with breakfast., Disp: 90 tablet, Rfl: 2 .  insulin glargine (LANTUS) 100 UNIT/ML injection, Inject 10 Units into the skin at bedtime., Disp: , Rfl:  .  insulin lispro (HUMALOG KWIKPEN) 100 UNIT/ML KiwkPen, Inject 0.1 mLs (10 Units total) into the skin 3 (three) times daily., Disp: 15 mL, Rfl: 11 .  Insulin Pen Needle 29G X MISC, 1 Units by Does not apply route 4 (four) times daily., Disp: 100 each, Rfl: 11 .  Insulin Syringe-Needle U-100 (BD INSULIN SYRINGE ULTRAFINE) 31G X 5/16" 0.5 ML MISC, 1 each by Does not apply route 4 (four) times daily -  before meals and at bedtime., Disp: 100 each, Rfl: 11 .  lisinopril (ZESTRIL) 2.5 MG tablet, Take  1 tablet (2.5 mg total) by mouth daily., Disp: 30 tablet, Rfl: 5 .  rosuvastatin (CRESTOR) 20 MG tablet, Take 1 tablet (20 mg total) by mouth daily., Disp: 90 tablet, Rfl: 1  Social History   Tobacco Use  Smoking Status Never Smoker  Smokeless Tobacco Never Used    No Known Allergies Objective:  There were no vitals filed for this visit. There is no height or weight on file to calculate BMI. Constitutional Well developed. Well nourished.  Vascular Dorsalis pedis pulses palpable bilaterally. Posterior tibial pulses palpable bilaterally. Capillary refill normal to all digits.  No cyanosis or clubbing noted. Pedal hair growth normal.  Neurologic Normal speech. Oriented to person, place, and time. Epicritic sensation to light touch grossly present bilaterally.  Dermatologic Nails well groomed and normal in appearance. No open wounds. No skin lesions.  Orthopedic:  Pain on palpation to the left posterior tibial tendon along the course of the tendon as well as the insertion.  Pain with inversion of the foot passive and active range of motion of the ankle.  No pain with dorsiflexion plantarflexion or eversion of the foot.   Radiographs: None Assessment:   1. Uncontrolled type 2 diabetes mellitus with hyperglycemia (HCC)   2. Tibial tendinitis, posterior, left   3. Pain in left foot    Plan:  Patient was evaluated and treated and all questions answered.  Left posterior tibial tendinitis -I explained to the patient given that the cam boot is helping with the pain and the swelling, I believe the patient will benefit from cam boot therapy.  I explained to him about appropriate supportive shoe gear modifications.  Patient states he will do them right away.  I have asked the patient to wear the cam boot as needed and only when the pain along the posterior tibial tendon has worsened. -I explained to the patient the importance of orthotics for long-term management of posterior tibial  tendon.  However patient would like to hold off for those for now as they are too expensive. -I will see the patient back as needed and if his symptoms worsen or in the future he is planning on obtaining custom-made orthotics.  No follow-ups on file.

## 2019-02-22 MED FILL — ?ROSUVASTATIN CALCIUM 20MG: 20 | 30 days supply | Qty: 30 | Fill #0

## 2019-02-22 MED FILL — glipiZIDE XL 10 MG TB24: 10 | 30 days supply | Qty: 30 | Fill #1

## 2019-02-22 MED FILL — LISINOPRIL 2.5 MG TABLET: 2.5 | 30 days supply | Qty: 30 | Fill #4

## 2019-02-23 ENCOUNTER — Encounter: Payer: Self-pay | Admitting: Family Medicine

## 2019-02-23 ENCOUNTER — Other Ambulatory Visit: Payer: Self-pay

## 2019-02-23 ENCOUNTER — Ambulatory Visit (INDEPENDENT_AMBULATORY_CARE_PROVIDER_SITE_OTHER): Payer: Self-pay | Admitting: Family Medicine

## 2019-02-23 VITALS — BP 112/60 | HR 94 | Temp 98.0°F | Ht 74.0 in | Wt 273.8 lb

## 2019-02-23 DIAGNOSIS — E785 Hyperlipidemia, unspecified: Secondary | ICD-10-CM

## 2019-02-23 DIAGNOSIS — R7309 Other abnormal glucose: Secondary | ICD-10-CM

## 2019-02-23 DIAGNOSIS — E1165 Type 2 diabetes mellitus with hyperglycemia: Secondary | ICD-10-CM

## 2019-02-23 DIAGNOSIS — R739 Hyperglycemia, unspecified: Secondary | ICD-10-CM

## 2019-02-23 DIAGNOSIS — Z09 Encounter for follow-up examination after completed treatment for conditions other than malignant neoplasm: Secondary | ICD-10-CM

## 2019-02-23 DIAGNOSIS — Z6835 Body mass index (BMI) 35.0-35.9, adult: Secondary | ICD-10-CM

## 2019-02-23 LAB — POCT URINALYSIS DIPSTICK
Bilirubin, UA: NEGATIVE
Blood, UA: NEGATIVE
Glucose, UA: POSITIVE — AB
Ketones, UA: NEGATIVE
Leukocytes, UA: NEGATIVE
Nitrite, UA: NEGATIVE
Protein, UA: POSITIVE — AB
Spec Grav, UA: >=1.03 — AB (ref 1.010–1.025)
Urobilinogen, UA: 0.2 E.U./dL
pH, UA: 5 (ref 5.0–8.0)

## 2019-02-23 LAB — GLUCOSE, POCT (MANUAL RESULT ENTRY)
POC Glucose: 397 mg/dl — AB (ref 70–99)
POC Glucose: 385 mg/dl — AB (ref 70–99)

## 2019-02-23 LAB — POCT GLYCOSYLATED HEMOGLOBIN (HGB A1C): Hemoglobin A1C: 10.9 % — AB (ref 4.0–5.6)

## 2019-02-23 MED ORDER — INSULIN LISPRO 100 UNIT/ML ~~LOC~~ SOLN
20.0000 [IU] | Freq: Once | SUBCUTANEOUS | Status: AC
  Administered 2019-02-23: 10:00:00 20 [IU] via SUBCUTANEOUS

## 2019-02-23 NOTE — Patient Instructions (Addendum)
Diabetes Mellitus and Nutrition, Adult When you have diabetes (diabetes mellitus), it is very important to have healthy eating habits because your blood sugar (glucose) levels are greatly affected by what you eat and drink. Eating healthy foods in the appropriate amounts, at about the same times every day, can help you:  Control your blood glucose.  Lower your risk of heart disease.  Improve your blood pressure.  Reach or maintain a healthy weight. Every person with diabetes is different, and each person has different needs for a meal plan. Your health care provider may recommend that you work with a diet and nutrition specialist (dietitian) to make a meal plan that is best for you. Your meal plan may vary depending on factors such as:  The calories you need.  The medicines you take.  Your weight.  Your blood glucose, blood pressure, and cholesterol levels.  Your activity level.  Other health conditions you have, such as heart or kidney disease. How do carbohydrates affect me? Carbohydrates, also called carbs, affect your blood glucose level more than any other type of food. Eating carbs naturally raises the amount of glucose in your blood. Carb counting is a method for keeping track of how many carbs you eat. Counting carbs is important to keep your blood glucose at a healthy level, especially if you use insulin or take certain oral diabetes medicines. It is important to know how many carbs you can safely have in each meal. This is different for every person. Your dietitian can help you calculate how many carbs you should have at each meal and for each snack. Foods that contain carbs include:  Bread, cereal, rice, pasta, and crackers.  Potatoes and corn.  Peas, beans, and lentils.  Milk and yogurt.  Fruit and juice.  Desserts, such as cakes, cookies, ice cream, and candy. How does alcohol affect me? Alcohol can cause a sudden decrease in blood glucose (hypoglycemia),  especially if you use insulin or take certain oral diabetes medicines. Hypoglycemia can be a life-threatening condition. Symptoms of hypoglycemia (sleepiness, dizziness, and confusion) are similar to symptoms of having too much alcohol. If your health care provider says that alcohol is safe for you, follow these guidelines:  Limit alcohol intake to no more than 1 drink per day for nonpregnant women and 2 drinks per day for men. One drink equals 12 oz of beer, 5 oz of wine, or 1 oz of hard liquor.  Do not drink on an empty stomach.  Keep yourself hydrated with water, diet soda, or unsweetened iced tea.  Keep in mind that regular soda, juice, and other mixers may contain a lot of sugar and must be counted as carbs. What are tips for following this plan?  Reading food labels  Start by checking the serving size on the "Nutrition Facts" label of packaged foods and drinks. The amount of calories, carbs, fats, and other nutrients listed on the label is based on one serving of the item. Many items contain more than one serving per package.  Check the total grams (g) of carbs in one serving. You can calculate the number of servings of carbs in one serving by dividing the total carbs by 15. For example, if a food has 30 g of total carbs, it would be equal to 2 servings of carbs.  Check the number of grams (g) of saturated and trans fats in one serving. Choose foods that have low or no amount of these fats.  Check the number of   milligrams (mg) of salt (sodium) in one serving. Most people should limit total sodium intake to less than 2,300 mg per day.  Always check the nutrition information of foods labeled as "low-fat" or "nonfat". These foods may be higher in added sugar or refined carbs and should be avoided.  Talk to your dietitian to identify your daily goals for nutrients listed on the label. Shopping  Avoid buying canned, premade, or processed foods. These foods tend to be high in fat, sodium,  and added sugar.  Shop around the outside edge of the grocery store. This includes fresh fruits and vegetables, bulk grains, fresh meats, and fresh dairy. Cooking  Use low-heat cooking methods, such as baking, instead of high-heat cooking methods like deep frying.  Cook using healthy oils, such as olive, canola, or sunflower oil.  Avoid cooking with butter, cream, or high-fat meats. Meal planning  Eat meals and snacks regularly, preferably at the same times every day. Avoid going long periods of time without eating.  Eat foods high in fiber, such as fresh fruits, vegetables, beans, and whole grains. Talk to your dietitian about how many servings of carbs you can eat at each meal.  Eat 4-6 ounces (oz) of lean protein each day, such as lean meat, chicken, fish, eggs, or tofu. One oz of lean protein is equal to: ? 1 oz of meat, chicken, or fish. ? 1 egg. ?  cup of tofu.  Eat some foods each day that contain healthy fats, such as avocado, nuts, seeds, and fish. Lifestyle  Check your blood glucose regularly.  Exercise regularly as told by your health care provider. This may include: ? 150 minutes of moderate-intensity or vigorous-intensity exercise each week. This could be brisk walking, biking, or water aerobics. ? Stretching and doing strength exercises, such as yoga or weightlifting, at least 2 times a week.  Take medicines as told by your health care provider.  Do not use any products that contain nicotine or tobacco, such as cigarettes and e-cigarettes. If you need help quitting, ask your health care provider.  Work with a counselor or diabetes educator to identify strategies to manage stress and any emotional and social challenges. Questions to ask a health care provider  Do I need to meet with a diabetes educator?  Do I need to meet with a dietitian?  What number can I call if I have questions?  When are the best times to check my blood glucose? Where to find more  information:  American Diabetes Association: diabetes.org  Academy of Nutrition and Dietetics: www.eatright.org  National Institute of Diabetes and Digestive and Kidney Diseases (NIH): www.niddk.nih.gov Summary  A healthy meal plan will help you control your blood glucose and maintain a healthy lifestyle.  Working with a diet and nutrition specialist (dietitian) can help you make a meal plan that is best for you.  Keep in mind that carbohydrates (carbs) and alcohol have immediate effects on your blood glucose levels. It is important to count carbs and to use alcohol carefully. This information is not intended to replace advice given to you by your health care provider. Make sure you discuss any questions you have with your health care provider. Document Released: 12/13/2004 Document Revised: 02/28/2017 Document Reviewed: 04/22/2016 Elsevier Patient Education  2020 Elsevier Inc.   DASH Eating Plan DASH stands for "Dietary Approaches to Stop Hypertension." The DASH eating plan is a healthy eating plan that has been shown to reduce high blood pressure (hypertension). It may also reduce   your risk for type 2 diabetes, heart disease, and stroke. The DASH eating plan may also help with weight loss. What are tips for following this plan?  General guidelines  Avoid eating more than 2,300 mg (milligrams) of salt (sodium) a day. If you have hypertension, you may need to reduce your sodium intake to 1,500 mg a day.  Limit alcohol intake to no more than 1 drink a day for nonpregnant women and 2 drinks a day for men. One drink equals 12 oz of beer, 5 oz of wine, or 1 oz of hard liquor.  Work with your health care provider to maintain a healthy body weight or to lose weight. Ask what an ideal weight is for you.  Get at least 30 minutes of exercise that causes your heart to beat faster (aerobic exercise) most days of the week. Activities may include walking, swimming, or biking.  Work with your  health care provider or diet and nutrition specialist (dietitian) to adjust your eating plan to your individual calorie needs. Reading food labels   Check food labels for the amount of sodium per serving. Choose foods with less than 5 percent of the Daily Value of sodium. Generally, foods with less than 300 mg of sodium per serving fit into this eating plan.  To find whole grains, look for the word "whole" as the first word in the ingredient list. Shopping  Buy products labeled as "low-sodium" or "no salt added."  Buy fresh foods. Avoid canned foods and premade or frozen meals. Cooking  Avoid adding salt when cooking. Use salt-free seasonings or herbs instead of table salt or sea salt. Check with your health care provider or pharmacist before using salt substitutes.  Do not fry foods. Cook foods using healthy methods such as baking, boiling, grilling, and broiling instead.  Cook with heart-healthy oils, such as olive, canola, soybean, or sunflower oil. Meal planning  Eat a balanced diet that includes: ? 5 or more servings of fruits and vegetables each day. At each meal, try to fill half of your plate with fruits and vegetables. ? Up to 6-8 servings of whole grains each day. ? Less than 6 oz of lean meat, poultry, or fish each day. A 3-oz serving of meat is about the same size as a deck of cards. One egg equals 1 oz. ? 2 servings of low-fat dairy each day. ? A serving of nuts, seeds, or beans 5 times each week. ? Heart-healthy fats. Healthy fats called Omega-3 fatty acids are found in foods such as flaxseeds and coldwater fish, like sardines, salmon, and mackerel.  Limit how much you eat of the following: ? Canned or prepackaged foods. ? Food that is high in trans fat, such as fried foods. ? Food that is high in saturated fat, such as fatty meat. ? Sweets, desserts, sugary drinks, and other foods with added sugar. ? Full-fat dairy products.  Do not salt foods before eating.  Try  to eat at least 2 vegetarian meals each week.  Eat more home-cooked food and less restaurant, buffet, and fast food.  When eating at a restaurant, ask that your food be prepared with less salt or no salt, if possible. What foods are recommended? The items listed may not be a complete list. Talk with your dietitian about what dietary choices are best for you. Grains Whole-grain or whole-wheat bread. Whole-grain or whole-wheat pasta. Brown rice. Oatmeal. Quinoa. Bulgur. Whole-grain and low-sodium cereals. Pita bread. Low-fat, low-sodium crackers. Whole-wheat flour tortillas.   Vegetables Fresh or frozen vegetables (raw, steamed, roasted, or grilled). Low-sodium or reduced-sodium tomato and vegetable juice. Low-sodium or reduced-sodium tomato sauce and tomato paste. Low-sodium or reduced-sodium canned vegetables. Fruits All fresh, dried, or frozen fruit. Canned fruit in natural juice (without added sugar). Meat and other protein foods Skinless chicken or turkey. Ground chicken or turkey. Pork with fat trimmed off. Fish and seafood. Egg whites. Dried beans, peas, or lentils. Unsalted nuts, nut butters, and seeds. Unsalted canned beans. Lean cuts of beef with fat trimmed off. Low-sodium, lean deli meat. Dairy Low-fat (1%) or fat-free (skim) milk. Fat-free, low-fat, or reduced-fat cheeses. Nonfat, low-sodium ricotta or cottage cheese. Low-fat or nonfat yogurt. Low-fat, low-sodium cheese. Fats and oils Soft margarine without trans fats. Vegetable oil. Low-fat, reduced-fat, or light mayonnaise and salad dressings (reduced-sodium). Canola, safflower, olive, soybean, and sunflower oils. Avocado. Seasoning and other foods Herbs. Spices. Seasoning mixes without salt. Unsalted popcorn and pretzels. Fat-free sweets. What foods are not recommended? The items listed may not be a complete list. Talk with your dietitian about what dietary choices are best for you. Grains Baked goods made with fat, such as  croissants, muffins, or some breads. Dry pasta or rice meal packs. Vegetables Creamed or fried vegetables. Vegetables in a cheese sauce. Regular canned vegetables (not low-sodium or reduced-sodium). Regular canned tomato sauce and paste (not low-sodium or reduced-sodium). Regular tomato and vegetable juice (not low-sodium or reduced-sodium). Pickles. Olives. Fruits Canned fruit in a light or heavy syrup. Fried fruit. Fruit in cream or butter sauce. Meat and other protein foods Fatty cuts of meat. Ribs. Fried meat. Bacon. Sausage. Bologna and other processed lunch meats. Salami. Fatback. Hotdogs. Bratwurst. Salted nuts and seeds. Canned beans with added salt. Canned or smoked fish. Whole eggs or egg yolks. Chicken or turkey with skin. Dairy Whole or 2% milk, cream, and half-and-half. Whole or full-fat cream cheese. Whole-fat or sweetened yogurt. Full-fat cheese. Nondairy creamers. Whipped toppings. Processed cheese and cheese spreads. Fats and oils Butter. Stick margarine. Lard. Shortening. Ghee. Bacon fat. Tropical oils, such as coconut, palm kernel, or palm oil. Seasoning and other foods Salted popcorn and pretzels. Onion salt, garlic salt, seasoned salt, table salt, and sea salt. Worcestershire sauce. Tartar sauce. Barbecue sauce. Teriyaki sauce. Soy sauce, including reduced-sodium. Steak sauce. Canned and packaged gravies. Fish sauce. Oyster sauce. Cocktail sauce. Horseradish that you find on the shelf. Ketchup. Mustard. Meat flavorings and tenderizers. Bouillon cubes. Hot sauce and Tabasco sauce. Premade or packaged marinades. Premade or packaged taco seasonings. Relishes. Regular salad dressings. Where to find more information:  National Heart, Lung, and Blood Institute: www.nhlbi.nih.gov  American Heart Association: www.heart.org Summary  The DASH eating plan is a healthy eating plan that has been shown to reduce high blood pressure (hypertension). It may also reduce your risk for type 2  diabetes, heart disease, and stroke.  With the DASH eating plan, you should limit salt (sodium) intake to 2,300 mg a day. If you have hypertension, you may need to reduce your sodium intake to 1,500 mg a day.  When on the DASH eating plan, aim to eat more fresh fruits and vegetables, whole grains, lean proteins, low-fat dairy, and heart-healthy fats.  Work with your health care provider or diet and nutrition specialist (dietitian) to adjust your eating plan to your individual calorie needs. This information is not intended to replace advice given to you by your health care provider. Make sure you discuss any questions you have with your health care provider. Document   Released: 03/07/2011 Document Revised: 02/28/2017 Document Reviewed: 03/11/2016 Elsevier Patient Education  2020 Elsevier Inc.   

## 2019-02-23 NOTE — Progress Notes (Signed)
Patient [facility]   Established Patient Office Visit  Subjective:  Patient ID: Micheal Neal, male    DOB: 24-Mar-1971  Age: 48 y.o. MRN: [id number]  CC:  Chief Complaint  Patient presents with  . Follow-up    Former Venora Maples NP, DM    HPI Micheal Neal is a 48 year old male presents for Follow Up today.   Past Medical History:  Diagnosis Date  . Diabetes mellitus without complication (HCC)    Current Status: This will be Micheal Neal initial office visit with me. He was previously seeing Lanae Boast, NP for his PCP needs. Since his last office visit, he is doing well with no complaints. He denies fevers, chills, fatigue, recent infections, weight loss, and night sweats. He has not had any headaches, visual changes, dizziness, and falls. No chest pain, heart palpitations, cough and shortness of breath reported. No reports of GI problems such as nausea, vomiting, diarrhea, and constipation. He has no reports of blood in stools, dysuria and hematuria. No depression or anxiety, and denies suicidal ideations, homicidal ideations, or auditory hallucinations. He denies pain today.   History reviewed. No pertinent surgical history.  Family History  Problem Relation Age of Onset  . Breast cancer Maternal Aunt   . Breast cancer Maternal Grandmother        diagnosed late 75's    Social History   Socioeconomic History  . Marital status: Married    Spouse name: Not on file  . Number of children: Not on file  . Years of education: Not on file  . Highest education level: Not on file  Occupational History  . Not on file  Social Needs  . Financial resource strain: Not on file  . Food insecurity    Worry: Not on file    Inability: Not on file  . Transportation needs    Medical: Not on file    Non-medical: Not on file  Tobacco Use  . Smoking status: Never Smoker  . Smokeless tobacco: Never Used  Substance and Sexual Activity  . Alcohol  use: No  . Drug use: No  . Sexual activity: Yes    Partners: Female  Lifestyle  . Physical activity    Days per week: Not on file    Minutes per session: Not on file  . Stress: Not on file  Relationships  . Social Herbalist on phone: Not on file    Gets together: Not on file    Attends religious service: Not on file    Active member of club or organization: Not on file    Attends meetings of clubs or organizations: Not on file    Relationship status: Not on file  . Intimate partner violence    Fear of current or ex partner: Not on file    Emotionally abused: Not on file    Physically abused: Not on file    Forced sexual activity: Not on file  Other Topics Concern  . Not on file  Social History Narrative  . Not on file    Outpatient Medications Prior to Visit  Medication Sig Dispense Refill  . aspirin EC 81 MG tablet Take 1 tablet (81 mg total) by mouth daily. 30 tablet 11  . glipiZIDE (GLUCOTROL XL) 10 MG 24 hr tablet Take 1 tablet (10 mg total) by mouth daily with breakfast. 90 tablet 2  . insulin glargine (LANTUS) 100 UNIT/ML injection Inject 10  Units into the skin at bedtime.    . insulin lispro (HUMALOG KWIKPEN) 100 UNIT/ML KiwkPen Inject 0.1 mLs (10 Units total) into the skin 3 (three) times daily. 15 mL 11  . Insulin Pen Needle 29G X MISC 1 Units by Does not apply route 4 (four) times daily. 100 each 11  . Insulin Syringe-Needle U-100 (BD INSULIN SYRINGE ULTRAFINE) 31G X 5/16" 0.5 ML MISC 1 each by Does not apply route 4 (four) times daily -  before meals and at bedtime. 100 each 11  . lisinopril (ZESTRIL) 2.5 MG tablet Take 1 tablet (2.5 mg total) by mouth daily. 30 tablet 5  . rosuvastatin (CRESTOR) 20 MG tablet Take 1 tablet (20 mg total) by mouth daily. 90 tablet 1   No facility-administered medications prior to visit.     No Known Allergies  ROS Review of Systems  Constitutional: Negative.   HENT: Negative.   Eyes: Positive for visual  disturbance (blurred vision).  Respiratory: Negative.   Cardiovascular: Negative.   Gastrointestinal: Negative.   Endocrine: Positive for polydipsia, polyphagia and polyuria.  Genitourinary: Negative.   Musculoskeletal: Positive for arthralgias (generalized).  Skin: Negative.   Allergic/Immunologic: Negative.   Neurological: Positive for dizziness (occasional ) and headaches (occasional ).  Hematological: Negative.   Psychiatric/Behavioral: Negative.    Objective:    Physical Exam  Constitutional: He is oriented to person, place, and time. He appears well-developed and well-nourished.  HENT:  Head: Normocephalic and atraumatic.  Eyes: Conjunctivae are normal.  Neck: Normal range of motion. Neck supple.  Cardiovascular: Normal rate, regular rhythm, normal heart sounds and intact distal pulses.  Pulmonary/Chest: Effort normal and breath sounds normal.  Abdominal: Soft. Bowel sounds are normal. He exhibits distension (obese).  Musculoskeletal: Normal range of motion.  Neurological: He is alert and oriented to person, place, and time. He has normal reflexes.  Skin: Skin is warm and dry.  Psychiatric: He has a normal mood and affect. His behavior is normal. Judgment and thought content normal.  Vitals reviewed.   BP 112/60 Comment: manually  Pulse 94   Temp 98 F (36.7 C) (Oral)   Ht 6\' 2"  (1.88 m)   Wt 273 lb 12.8 oz (124.2 kg)   SpO2 99%   BMI 35.15 kg/m  Wt Readings from Last 3 Encounters:  02/23/19 273 lb 12.8 oz (124.2 kg)  11/23/18 267 lb (121.1 kg)  11/05/18 269 lb (122 kg)     Health Maintenance Due  Topic Date Due  . OPHTHALMOLOGY EXAM  07/31/1982  . FOOT EXAM  04/18/2018    There are no preventive care reminders to display for this patient.  Lab Results  Component Value Date   TSH 2.160 07/20/2018   Lab Results  Component Value Date   WBC 7.5 11/13/2018   HGB 13.0 11/13/2018   HCT 39.3 11/13/2018   MCV 85.4 11/13/2018   PLT 234 11/13/2018   Lab  Results  Component Value Date   NA 132 (L) 11/13/2018   K 4.3 11/13/2018   CO2 26 11/13/2018   GLUCOSE 468 (H) 11/13/2018   BUN 19 11/13/2018   CREATININE 1.03 11/13/2018   BILITOT 0.8 11/13/2018   ALKPHOS 96 11/13/2018   AST 19 11/13/2018   ALT 24 11/13/2018   PROT 7.7 11/13/2018   ALBUMIN 3.7 11/13/2018   CALCIUM 9.1 11/13/2018   ANIONGAP 8 11/13/2018   Lab Results  Component Value Date   CHOL 140 05/06/2018   Lab Results  Component  Value Date   HDL 40 05/06/2018   Lab Results  Component Value Date   LDLCALC 72 05/06/2018   Lab Results  Component Value Date   TRIG 140 05/06/2018   Lab Results  Component Value Date   CHOLHDL 3.5 05/06/2018   Lab Results  Component Value Date   HGBA1C 10.9 (A) 02/23/2019      Assessment & Plan:   1. Uncontrolled type 2 diabetes mellitus with hyperglycemia (HCC) Blood glucose elevated at 397 today. 20 units of Humalog given to patient in office today. Glucose level decreased minimally to 285. Patient refused additional insulin or IVF infusion today. He will continue to monitor blood glucose at home, and we will schedule him for blood glucose recheck in 1 week.  - POCT HgB A1C - Glucose (CBG) - Urinalysis Dipstick - insulin lispro (HUMALOG) injection 20 Units - Glucose (CBG)  2. Hemoglobin A1C greater than 9%, indicating poor diabetic control Worsening. Hgb A1c at 10.9 from 10.8 on 11/23/2018. He will continue medication as prescribed, to decrease foods/beverages high in sugars and carbs and follow Heart Healthy or DASH diet. Increase physical activity to at least 30 minutes cardio exercise daily.   3. Hyperglycemia  4. Hyperlipidemia, unspecified hyperlipidemia type  5. Obesity (BMI 35.0-39.9) Body mass index is 35.15 kg/m. Goal BMI  is <30. Encouraged efforts to reduce weight include engaging in physical activity as tolerated with goal of 150 minutes per week. Improve dietary choices and eat a meal regimen consistent with  a Mediterranean or DASH diet. Reduce simple carbohydrates. Do not skip meals and eat healthy snacks throughout the day to avoid over-eating at dinner. Set a goal weight loss that is achievable for you.  6. Follow up He will follow up for office visit in 3 months.  He will follow up for blood glucose re-check in 1 week.   Meds ordered this encounter  Medications  . insulin lispro (HUMALOG) injection 20 Units    Orders Placed This Encounter  Procedures  . POCT HgB A1C  . Glucose (CBG)  . Urinalysis Dipstick  . Glucose (CBG)   Referral Orders  No referral(s) requested today    Raliegh IpNatalie Lelani Garnett,  MSN, FNP-BC [facility] [address]  [place], [place] [address] [phone] fax    Problem List Items Addressed This Visit      Endocrine   DM (diabetes mellitus), type 2, uncontrolled (HCC) - Primary   Relevant Orders   POCT HgB A1C (Completed)   Glucose (CBG) (Completed)   Urinalysis Dipstick (Completed)   Glucose (CBG) (Completed)     Other   Obesity (BMI 30-39.9)    Other Visit Diagnoses    Hemoglobin A1C greater than 9%, indicating poor diabetic control       Relevant Medications   insulin lispro (HUMALOG) injection 20 Units (Completed)   Hyperglycemia       Hyperlipidemia, unspecified hyperlipidemia type       Follow up          Meds ordered this encounter  Medications  . insulin lispro (HUMALOG) injection 20 Units    Follow-up: No follow-ups on file.    Kallie LocksNatalie M Madie Cahn, FNP

## 2019-02-24 DIAGNOSIS — R739 Hyperglycemia, unspecified: Secondary | ICD-10-CM | POA: Insufficient documentation

## 2019-02-24 DIAGNOSIS — E785 Hyperlipidemia, unspecified: Secondary | ICD-10-CM | POA: Insufficient documentation

## 2019-02-24 DIAGNOSIS — R7309 Other abnormal glucose: Secondary | ICD-10-CM | POA: Insufficient documentation

## 2019-03-02 ENCOUNTER — Other Ambulatory Visit: Payer: Self-pay

## 2019-03-02 ENCOUNTER — Ambulatory Visit (INDEPENDENT_AMBULATORY_CARE_PROVIDER_SITE_OTHER): Payer: Self-pay

## 2019-03-02 DIAGNOSIS — R739 Hyperglycemia, unspecified: Secondary | ICD-10-CM

## 2019-03-02 LAB — GLUCOSE, POCT (MANUAL RESULT ENTRY): POC Glucose: 162 mg/dl — AB (ref 70–99)

## 2019-03-02 NOTE — Progress Notes (Signed)
CBG check only. Lab done and reported Kathe Becton NP.

## 2019-03-23 MED FILL — glipiZIDE XL 10 MG TB24: 10 | 30 days supply | Qty: 30 | Fill #2

## 2019-03-23 MED FILL — ?ROSUVASTATIN CALCIUM 20MG: 20 | 30 days supply | Qty: 30 | Fill #1

## 2019-03-23 MED FILL — LISINOPRIL 2.5 MG TABLET: 2.5 | 30 days supply | Qty: 30 | Fill #5

## 2019-03-24 ENCOUNTER — Other Ambulatory Visit: Payer: Self-pay | Admitting: Family Medicine

## 2019-03-24 DIAGNOSIS — E1165 Type 2 diabetes mellitus with hyperglycemia: Secondary | ICD-10-CM

## 2019-03-24 MED FILL — TRUEPLUS 5-BEVEL PEN NEEDLE: 31G X 5 MM | 25 days supply | Qty: 100 | Fill #0

## 2019-03-29 ENCOUNTER — Ambulatory Visit: Payer: No Typology Code available for payment source | Admitting: Podiatry

## 2019-03-31 ENCOUNTER — Ambulatory Visit: Payer: No Typology Code available for payment source

## 2019-03-31 ENCOUNTER — Ambulatory Visit (INDEPENDENT_AMBULATORY_CARE_PROVIDER_SITE_OTHER): Payer: No Typology Code available for payment source | Admitting: Podiatry

## 2019-03-31 ENCOUNTER — Other Ambulatory Visit: Payer: Self-pay

## 2019-03-31 DIAGNOSIS — M79672 Pain in left foot: Secondary | ICD-10-CM

## 2019-03-31 DIAGNOSIS — T148XXA Other injury of unspecified body region, initial encounter: Secondary | ICD-10-CM

## 2019-03-31 DIAGNOSIS — E1165 Type 2 diabetes mellitus with hyperglycemia: Secondary | ICD-10-CM

## 2019-04-01 ENCOUNTER — Other Ambulatory Visit: Payer: Self-pay

## 2019-04-01 ENCOUNTER — Encounter: Payer: Self-pay | Admitting: Podiatry

## 2019-04-01 DIAGNOSIS — T148XXA Other injury of unspecified body region, initial encounter: Secondary | ICD-10-CM

## 2019-04-01 DIAGNOSIS — M76822 Posterior tibial tendinitis, left leg: Secondary | ICD-10-CM

## 2019-04-01 NOTE — Progress Notes (Signed)
Subjective:  Patient ID: Baraka Klatt, male    DOB: 1971-01-02,  MRN: [id number]  Chief Complaint  Patient presents with  . Foot Pain    pt is here for a left foot pain f/u, pt states that the left foot is still hurting, pt also states that the cam boot he has recieved last time, has not helped with the swelling    48 y.o. male presents with the above complaint.  Patient presents with complaint of left foot pain that is still causing him a lot of pain.  Patient states the foot pain is now in the arch of the foot as well as submetatarsal one.  Patient was last seen by me for a follow-up from left posterior tibial tendinitis which appears to have resolved.  Patient states the boot has not helped with this pain that is currently having.  He states the injection has not helped as well.  He states that he has now still hurting.  Pain is scale is 1 out of 10 when walking.  It appears that his posterior tibial pain may have distally translated to the submetatarsal one pain.  He denies any other acute complaints.  Patient is a type II diabetic with last A1c of 10.9.  He states that he is trying to do better with the sugar control.   Review of Systems: Negative except as noted in the HPI. Denies N/V/F/Ch.  Past Medical History:  Diagnosis Date  . Diabetes mellitus without complication (HCC)     Current Outpatient Medications:  .  aspirin EC 81 MG tablet, Take 1 tablet (81 mg total) by mouth daily., Disp: 30 tablet, Rfl: 11 .  glipiZIDE (GLUCOTROL XL) 10 MG 24 hr tablet, Take 1 tablet (10 mg total) by mouth daily with breakfast., Disp: 90 tablet, Rfl: 2 .  insulin glargine (LANTUS) 100 UNIT/ML injection, Inject 10 Units into the skin at bedtime., Disp: , Rfl:  .  insulin lispro (HUMALOG KWIKPEN) 100 UNIT/ML KiwkPen, Inject 0.1 mLs (10 Units total) into the skin 3 (three) times daily., Disp: 15 mL, Rfl: 11 .  Insulin Syringe-Needle U-100 (BD INSULIN SYRINGE ULTRAFINE) 31G X 5/16" 0.5 ML MISC, 1 each by  Does not apply route 4 (four) times daily -  before meals and at bedtime., Disp: 100 each, Rfl: 11 .  lisinopril (ZESTRIL) 2.5 MG tablet, Take 1 tablet (2.5 mg total) by mouth daily., Disp: 30 tablet, Rfl: 5 .  rosuvastatin (CRESTOR) 20 MG tablet, Take 1 tablet (20 mg total) by mouth daily., Disp: 90 tablet, Rfl: 1 .  TRUEPLUS PEN NEEDLES 31G X 5 MM MISC, USE AS DIRECTED FOUR TIMES DAILY, Disp: 100 each, Rfl: 11  Social History   Tobacco Use  Smoking Status Never Smoker  Smokeless Tobacco Never Used    No Known Allergies Objective:  There were no vitals filed for this visit. There is no height or weight on file to calculate BMI. Constitutional Well developed. Well nourished.  Vascular Dorsalis pedis pulses palpable bilaterally. Posterior tibial pulses palpable bilaterally. Capillary refill normal to all digits.  No cyanosis or clubbing noted. Pedal hair growth normal.  Neurologic Normal speech. Oriented to person, place, and time. Epicritic sensation to light touch grossly present bilaterally.  Dermatologic Nails well groomed and normal in appearance. No open wounds. No skin lesions.  Orthopedic:  Pain on palpation to the first tarsometatarsal joint.  No pain with range of motion.  Pain extends distally to the submetatarsal one.  No pain along  the posterior tibial tendon including its insertion.  No pain with eversion or inversion of the foot.   Radiographs: Two views of skeletally mature adult foot: There is a small osteophyte that is loose on the dorsal aspect of the midfoot.  There is mild increase in the Lisfranc interval.  However patient denies any injury to the foot.  No other bony deformities noted. Assessment:   1. Uncontrolled type 2 diabetes mellitus with hyperglycemia (HCC)   2. Pain in left foot   3. Contusion of soft tissue    Plan:  Patient was evaluated and treated and all questions answered.  Left plantar midfoot pain in the medial arch/soft tissue  contusion -Given that patient has had this pain appearing to be around the first tarsometatarsal joint on the plantar side, I believe patient will benefit from MRI evaluation of the left midfoot.  As there is not any gross radiographic changes to the osseous nature of the first tarsometatarsal joint, I believe that this might be soft tissue related. -Also in the setting of type 2 diabetes with uncontrolled sugars with pain this could be beginning stages of Charcot.  I will continue to monitor the patient over the next couple of months to evaluate the osseous integrity especially at the midfoot.  If patient has continuous breakdown of the midfoot I will need to immobilize him with the total contact cast. -Patient will benefit from MRI evaluation of the soft tissue around plantar midfoot.  Return in about 4 weeks (around 04/28/2019).

## 2019-04-05 ENCOUNTER — Telehealth: Payer: Self-pay | Admitting: *Deleted

## 2019-04-05 DIAGNOSIS — T148XXA Other injury of unspecified body region, initial encounter: Secondary | ICD-10-CM

## 2019-04-05 DIAGNOSIS — M76822 Posterior tibial tendinitis, left leg: Secondary | ICD-10-CM

## 2019-04-05 NOTE — Telephone Encounter (Signed)
Orders to [facility], no pre-cert required pt has GCCN 100%.

## 2019-04-05 NOTE — Telephone Encounter (Signed)
-----   Message from Candelaria Stagers, DPM sent at 04/01/2019 12:01 PM EST ----- Regarding: MRI left foot Hi,Would you be able to order patient for MRI of the left foot to evaluate for soft tissue contusion around the first tarsometatarsal joint as well as evaluate the Lisfranc integrity.  Thanks Caryn Bee

## 2019-04-09 ENCOUNTER — Other Ambulatory Visit: Payer: Self-pay

## 2019-04-09 ENCOUNTER — Ambulatory Visit (HOSPITAL_COMMUNITY)
Admission: RE | Admit: 2019-04-09 | Discharge: 2019-04-09 | Disposition: A | Discharge status: Home / Self Care | Payer: Self-pay | Source: Ambulatory Visit | Attending: Podiatry | Admitting: Podiatry

## 2019-04-09 DIAGNOSIS — T148XXA Other injury of unspecified body region, initial encounter: Secondary | ICD-10-CM | POA: Insufficient documentation

## 2019-04-09 DIAGNOSIS — M76822 Posterior tibial tendinitis, left leg: Secondary | ICD-10-CM | POA: Insufficient documentation

## 2019-04-23 ENCOUNTER — Other Ambulatory Visit: Payer: Self-pay | Admitting: Family Medicine

## 2019-04-23 DIAGNOSIS — E1165 Type 2 diabetes mellitus with hyperglycemia: Secondary | ICD-10-CM

## 2019-04-23 MED FILL — LISINOPRIL 2.5 MG TABLET: 2.5 | 30 days supply | Qty: 30 | Fill #0

## 2019-04-23 MED FILL — ROSUVASTATIN CALCIUM 20 MG: 20 | 30 days supply | Qty: 30 | Fill #2

## 2019-04-23 MED FILL — glipiZIDE XL 10 MG TB24: 10 | 30 days supply | Qty: 30 | Fill #3

## 2019-04-23 MED FILL — !LANTUS SOLOSTAR 100UNITS/M: 100 | 15 days supply | Qty: 9 | Fill #0

## 2019-04-28 ENCOUNTER — Ambulatory Visit: Payer: No Typology Code available for payment source | Admitting: Podiatry

## 2019-04-28 ENCOUNTER — Other Ambulatory Visit: Payer: Self-pay

## 2019-04-28 DIAGNOSIS — E1165 Type 2 diabetes mellitus with hyperglycemia: Secondary | ICD-10-CM

## 2019-04-28 DIAGNOSIS — M79672 Pain in left foot: Secondary | ICD-10-CM

## 2019-04-28 DIAGNOSIS — E11610 Type 2 diabetes mellitus with diabetic neuropathic arthropathy: Secondary | ICD-10-CM

## 2019-04-29 ENCOUNTER — Encounter: Payer: Self-pay | Admitting: Podiatry

## 2019-04-29 NOTE — Progress Notes (Signed)
Subjective:  Patient ID: Micheal Neal, male    DOB: 1970/10/16,  MRN: [id number]  Chief Complaint  Patient presents with  . Foot Pain    pt is here for a 4 week f/u of the left foot pain, pt states that the left foot has recieved an MRI and is looking to know what to do moving forward    49 y.o. male presents with the above complaint.  Patient follows up with a 4-week follow-up from a left medial plantar forefoot pain right at the first tarsometatarsal joint.  Patient states he got the MRI and is here to go over the MRI and evaluate the result.  Patient states that there is still little bit of swelling down there.  His pain scale is 6 out of 10.  He denies any other acute complaints.  He has been ambulating in regular sneakers.   Review of Systems: Negative except as noted in the HPI. Denies N/V/F/Ch.  Past Medical History:  Diagnosis Date  . Diabetes mellitus without complication ([place])     Current Outpatient Medications:  .  aspirin EC 81 MG tablet, Take 1 tablet (81 mg total) by mouth daily., Disp: 30 tablet, Rfl: 11 .  glipiZIDE (GLUCOTROL XL) 10 MG 24 hr tablet, Take 1 tablet (10 mg total) by mouth daily with breakfast., Disp: 90 tablet, Rfl: 2 .  insulin glargine (LANTUS) 100 UNIT/ML injection, Inject 10 Units into the skin at bedtime., Disp: , Rfl:  .  insulin lispro (HUMALOG KWIKPEN) 100 UNIT/ML KiwkPen, Inject 0.1 mLs (10 Units total) into the skin 3 (three) times daily., Disp: 15 mL, Rfl: 11 .  Insulin Syringe-Needle U-100 (BD INSULIN SYRINGE ULTRAFINE) 31G X 5/16" 0.5 ML MISC, 1 each by Does not apply route 4 (four) times daily -  before meals and at bedtime., Disp: 100 each, Rfl: 11 .  LANTUS SOLOSTAR 100 UNIT/ML Solostar Pen, INJECT 55 UNITS INTO THE SKIN AT BEDTIME., Disp: 45 mL, Rfl: 11 .  lisinopril (ZESTRIL) 2.5 MG tablet, TAKE 1 TABLET BY MOUTH DAILY., Disp: 30 tablet, Rfl: 5 .  rosuvastatin (CRESTOR) 20 MG tablet, Take 1 tablet (20 mg total) by mouth daily., Disp: 90  tablet, Rfl: 1 .  TRUEPLUS PEN NEEDLES 31G X 5 MM MISC, USE AS DIRECTED FOUR TIMES DAILY, Disp: 100 each, Rfl: 11  Social History   Tobacco Use  Smoking Status Never Smoker  Smokeless Tobacco Never Used    No Known Allergies Objective:  There were no vitals filed for this visit. There is no height or weight on file to calculate BMI. Constitutional Well developed. Well nourished.  Vascular Dorsalis pedis pulses palpable bilaterally. Posterior tibial pulses palpable bilaterally. Capillary refill normal to all digits.  No cyanosis or clubbing noted. Pedal hair growth normal.  Neurologic Normal speech. Oriented to person, place, and time. Epicritic sensation to light touch grossly present bilaterally.  Dermatologic Nails well groomed and normal in appearance. No open wounds. No skin lesions.  Orthopedic:  Pain on palpation to the first tarsometatarsal joint.  No pain with range of motion.  Pain extends distally to the submetatarsal one.  No pain along the posterior tibial tendon including its insertion.  No pain with eversion or inversion of the foot.   Radiographs: MRI showed IMPRESSION: 1. Periarticular midfoot marrow edema involving the first through third TMT joints with early fragmentation of the medial cuneiform, torn Lisfranc ligament, and subluxation of the first and second TMT joints. In the absence of trauma, findings  are suspicious for neuropathic arthropathy. 2. Marrow edema involving the first and second metatarsal shafts, likely stress related. Assessment:   1. Uncontrolled type 2 diabetes mellitus with hyperglycemia (HCC)   2. Pain in left foot   3. Charcot foot due to diabetes mellitus (HCC)    Plan:  Patient was evaluated and treated and all questions answered.  Left plantar midfoot pain in the medial arch/soft tissue contusion -MRI was reviewed with the patient and all the questions were discussed in details.  It appears that the MRI showed that patient  has beginning stages of Charcot in stage I given that patient had erythema pain swelling without radiographic changes to his previous x-rays.  Given the light of this news I explained to the patient in great length about Charcot arthropathy and various treatment options associated with it. -Fortunately for the patient we were able to catch this debilitating disease in his acute stage and I believe patient will benefit from nonweightbearing to the left lower extremity with a cam boot for now.  Patient will need to be placed in a [place] walker to allow for ambulation with distribution of pressure.  Patient agrees with the plan and would like to proceed with obtaining the [facility] walker. -Patient is scheduled to see Raiford Noble for casting of the [place] walker in the meantime patient will be completely nonweightbearing to the left foot with crutches/knee scooter to allow for proper consolidation of the bone while the foot undergoes the disease process. -I spent greater than 45 minutes with the patient including 50% of that time with face-to-face encounter.   No follow-ups on file.

## 2019-04-30 ENCOUNTER — Other Ambulatory Visit: Payer: Self-pay

## 2019-04-30 ENCOUNTER — Other Ambulatory Visit: Payer: No Typology Code available for payment source | Admitting: Orthotics

## 2019-04-30 DIAGNOSIS — E11610 Type 2 diabetes mellitus with diabetic neuropathic arthropathy: Secondary | ICD-10-CM

## 2019-05-25 ENCOUNTER — Ambulatory Visit (INDEPENDENT_AMBULATORY_CARE_PROVIDER_SITE_OTHER): Payer: Self-pay | Admitting: Family Medicine

## 2019-05-25 ENCOUNTER — Encounter: Payer: Self-pay | Admitting: Family Medicine

## 2019-05-25 ENCOUNTER — Other Ambulatory Visit: Payer: Self-pay

## 2019-05-25 VITALS — BP 124/80 | HR 90 | Temp 97.8°F | Ht 74.0 in | Wt 280.0 lb

## 2019-05-25 DIAGNOSIS — S9032XD Contusion of left foot, subsequent encounter: Secondary | ICD-10-CM

## 2019-05-25 DIAGNOSIS — Z09 Encounter for follow-up examination after completed treatment for conditions other than malignant neoplasm: Secondary | ICD-10-CM

## 2019-05-25 DIAGNOSIS — E1165 Type 2 diabetes mellitus with hyperglycemia: Secondary | ICD-10-CM

## 2019-05-25 DIAGNOSIS — Z6835 Body mass index (BMI) 35.0-35.9, adult: Secondary | ICD-10-CM

## 2019-05-25 DIAGNOSIS — S9032XA Contusion of left foot, initial encounter: Secondary | ICD-10-CM

## 2019-05-25 DIAGNOSIS — R739 Hyperglycemia, unspecified: Secondary | ICD-10-CM

## 2019-05-25 DIAGNOSIS — E66812 Obesity, class 2: Secondary | ICD-10-CM

## 2019-05-25 DIAGNOSIS — R7309 Other abnormal glucose: Secondary | ICD-10-CM

## 2019-05-25 LAB — POCT URINALYSIS DIPSTICK
Blood, UA: NEGATIVE
Glucose, UA: NEGATIVE
Leukocytes, UA: NEGATIVE
Nitrite, UA: NEGATIVE
Protein, UA: NEGATIVE
Spec Grav, UA: >=1.03 — AB (ref 1.010–1.025)
Urobilinogen, UA: 1 E.U./dL
pH, UA: 5.5 (ref 5.0–8.0)

## 2019-05-25 LAB — POCT GLYCOSYLATED HEMOGLOBIN (HGB A1C): Hemoglobin A1C: 9.6 % — AB (ref 4.0–5.6)

## 2019-05-25 LAB — GLUCOSE, POCT (MANUAL RESULT ENTRY): POC Glucose: 218 mg/dl — AB (ref 70–99)

## 2019-05-25 NOTE — Progress Notes (Signed)
[facility]   Established Patient Office Visit  Subjective:  Patient ID: Micheal Neal, male    DOB: [birth year]  Age: 49 y.o. MRN: [id number]  CC:  Chief Complaint  Patient presents with  . Follow-up    DM    HPI Micheal Neal is a 49 year old male who presents for Follow Up today.   Past Medical History:  Diagnosis Date  . Contusion of left foot   . Diabetes mellitus without complication [facility])     Patient Active Problem List   Diagnosis Date Noted  . Hemoglobin A1C greater than 9%, indicating poor diabetic control 02/24/2019  . Hyperglycemia 02/24/2019  . Hyperlipidemia 02/24/2019  . Obesity (BMI 30-39.9) 05/24/2017  . Vision changes 05/29/2016  . Insomnia 10/15/2014  . Diabetic neuropathy (HCC) 10/14/2014  . BPPV (benign paroxysmal positional vertigo) 09/18/2014  . Allergic rhinitis 07/01/2014  . DM (diabetes mellitus), type 2, uncontrolled (HCC) 02/20/2011   Current Status: This will be his initial office visit with me. He was previously seeing Mike Gip, NP for her PCP needs. Since his last office visit, she is doing well with no complaints. He continues to follow up with Podiatry for left foot contusion. She denies fevers, chills, fatigue, recent infections, weight loss, and night sweats. She has not had any headaches, visual changes, dizziness, and falls. No chest pain, heart palpitations, cough and shortness of breath reported. No reports of GI problems such as nausea, vomiting, diarrhea, and constipation. She has no reports of blood in stools, dysuria and hematuria. No depression or anxiety, and denies suicidal ideations, homicidal ideations, or auditory hallucinations. She denies pain today.   History reviewed. No pertinent surgical history.  Family History  Problem Relation Age of Onset  . Breast cancer Maternal Aunt   . Breast cancer Maternal Grandmother        diagnosed late 45's    Social History    Socioeconomic History  . Marital status: Married    Spouse name: Not on file  . Number of children: Not on file  . Years of education: Not on file  . Highest education level: Not on file  Occupational History  . Not on file  Tobacco Use  . Smoking status: Never Smoker  . Smokeless tobacco: Never Used  Substance and Sexual Activity  . Alcohol use: No  . Drug use: No  . Sexual activity: Yes    Partners: Female  Other Topics Concern  . Not on file  Social History Narrative  . Not on file   Social Determinants of Health   Financial Resource Strain:   . Difficulty of Paying Living Expenses: Not on file  Food Insecurity:   . Worried About Programme researcher, broadcasting/film/video in the Last Year: Not on file  . Ran Out of Food in the Last Year: Not on file  Transportation Needs:   . Lack of Transportation (Medical): Not on file  . Lack of Transportation (Non-Medical): Not on file  Physical Activity:   . Days of Exercise per Week: Not on file  . Minutes of Exercise per Session: Not on file  Stress:   . Feeling of Stress : Not on file  Social Connections:   . Frequency of Communication with Friends and Family: Not on file  . Frequency of Social Gatherings with Friends and Family: Not on file  . Attends Religious Services: Not on file  . Active Member of Clubs or Organizations: Not  on file  . Attends Banker Meetings: Not on file  . Marital Status: Not on file  Intimate Partner Violence:   . Fear of Current or Ex-Partner: Not on file  . Emotionally Abused: Not on file  . Physically Abused: Not on file  . Sexually Abused: Not on file    Outpatient Medications Prior to Visit  Medication Sig Dispense Refill  . aspirin EC 81 MG tablet Take 1 tablet (81 mg total) by mouth daily. 30 tablet 11  . glipiZIDE (GLUCOTROL XL) 10 MG 24 hr tablet Take 1 tablet (10 mg total) by mouth daily with breakfast. 90 tablet 2  . insulin glargine (LANTUS) 100 UNIT/ML injection Inject 10 Units into the  skin at bedtime.    . insulin lispro (HUMALOG KWIKPEN) 100 UNIT/ML KiwkPen Inject 0.1 mLs (10 Units total) into the skin 3 (three) times daily. 15 mL 11  . Insulin Syringe-Needle U-100 (BD INSULIN SYRINGE ULTRAFINE) 31G X 5/16" 0.5 ML MISC 1 each by Does not apply route 4 (four) times daily -  before meals and at bedtime. 100 each 11  . LANTUS SOLOSTAR 100 UNIT/ML Solostar Pen INJECT 55 UNITS INTO THE SKIN AT BEDTIME. 45 mL 11  . lisinopril (ZESTRIL) 2.5 MG tablet TAKE 1 TABLET BY MOUTH DAILY. 30 tablet 5  . rosuvastatin (CRESTOR) 20 MG tablet Take 1 tablet (20 mg total) by mouth daily. 90 tablet 1  . TRUEPLUS PEN NEEDLES 31G X 5 MM MISC USE AS DIRECTED FOUR TIMES DAILY 100 each 11   No facility-administered medications prior to visit.    No Known Allergies  ROS Review of Systems  Constitutional: Negative.   HENT: Negative.   Eyes: Negative.   Respiratory: Negative.   Cardiovascular: Negative.   Gastrointestinal: Negative.   Endocrine: Negative.   Genitourinary: Negative.   Musculoskeletal: Negative.   Skin: Negative.   Allergic/Immunologic: Negative.   Neurological: Negative.   Hematological: Negative.   Psychiatric/Behavioral: Negative.       Objective:    Physical Exam  Constitutional: He is oriented to person, place, and time. He appears well-developed and well-nourished.  HENT:  Head: Normocephalic and atraumatic.  Eyes: Conjunctivae are normal.  Cardiovascular: Normal rate, regular rhythm, normal heart sounds and intact distal pulses.  Pulmonary/Chest: Effort normal and breath sounds normal.  Abdominal: Soft. Bowel sounds are normal.  Musculoskeletal:        General: Normal range of motion.     Cervical back: Normal range of motion and neck supple.  Neurological: He is alert and oriented to person, place, and time.  Skin: Skin is warm and dry.  Psychiatric: He has a normal mood and affect. His behavior is normal. Judgment and thought content normal.  Nursing note  and vitals reviewed.   BP 124/80   Pulse 90   Temp 97.8 F (36.6 C) (Oral)   Ht 6\' 2"  (1.88 m)   Wt 280 lb (127 kg)   SpO2 98%   BMI 35.95 kg/m  Wt Readings from Last 3 Encounters:  05/25/19 280 lb (127 kg)  02/23/19 273 lb 12.8 oz (124.2 kg)  11/23/18 267 lb (121.1 kg)     Health Maintenance Due  Topic Date Due  . OPHTHALMOLOGY EXAM  07/31/1982  . FOOT EXAM  04/18/2018    There are no preventive care reminders to display for this patient.  Lab Results  Component Value Date   TSH 2.160 07/20/2018   Lab Results  Component Value Date  WBC 7.5 11/13/2018   HGB 13.0 11/13/2018   HCT 39.3 11/13/2018   MCV 85.4 11/13/2018   PLT 234 11/13/2018   Lab Results  Component Value Date   NA 132 (L) 11/13/2018   K 4.3 11/13/2018   CO2 26 11/13/2018   GLUCOSE 468 (H) 11/13/2018   BUN 19 11/13/2018   CREATININE 1.03 11/13/2018   BILITOT 0.8 11/13/2018   ALKPHOS 96 11/13/2018   AST 19 11/13/2018   ALT 24 11/13/2018   PROT 7.7 11/13/2018   ALBUMIN 3.7 11/13/2018   CALCIUM 9.1 11/13/2018   ANIONGAP 8 11/13/2018   Lab Results  Component Value Date   CHOL 140 05/06/2018   Lab Results  Component Value Date   HDL 40 05/06/2018   Lab Results  Component Value Date   LDLCALC 72 05/06/2018   Lab Results  Component Value Date   TRIG 140 05/06/2018   Lab Results  Component Value Date   CHOLHDL 3.5 05/06/2018   Lab Results  Component Value Date   HGBA1C 9.6 (A) 05/25/2019      Assessment & Plan:   1. Uncontrolled type 2 diabetes mellitus with hyperglycemia (HCC) He will continue medication as prescribed, to decrease foods/beverages high in sugars and carbs and follow Heart Healthy or DASH diet. Increase physical activity to at least 30 minutes cardio exercise daily.  - POCT glucose (manual entry) - POCT glycosylated hemoglobin (Hb A1C) - POCT urinalysis dipstick  2. Hemoglobin A1C greater than 9%, indicating poor diabetic control Improved. Hgb A1c  decreased at 9.6 from, 10.9 on 02/23/2019. Monitor.   3. Class 2 severe obesity due to excess calories with serious comorbidity and body mass index (BMI) of 35.0 to 35.9 in adult [facility]) Body mass index is 35.95 kg/m.  Goal BMI  is <30. Encouraged efforts to reduce weight include engaging in physical activity as tolerated with goal of 150 minutes per week. Improve dietary choices and eat a meal regimen consistent with a Mediterranean or DASH diet. Reduce simple carbohydrates. Do not skip meals and eat healthy snacks throughout the day to avoid over-eating at dinner. Set a goal weight loss that is achievable for you.  4. Hyperglycemia  5. Contusion of left foot, subsequent encounter He continues to follow up with Podiatry as needed.   6. Follow up He will follow up with Thad Ranger, NP on 07/2019.  No orders of the defined types were placed in this encounter.   Orders Placed This Encounter  Procedures  . POCT glucose (manual entry)  . POCT glycosylated hemoglobin (Hb A1C)  . POCT urinalysis dipstick    Referral Orders  No referral(s) requested today    Raliegh Ip,  MSN, FNP-BC [facility] [address] [place], [place] [address] [phone] fax  Problem List Items Addressed This Visit      Endocrine   DM (diabetes mellitus), type 2, uncontrolled (HCC) - Primary   Relevant Orders   POCT glucose (manual entry) (Completed)   POCT glycosylated hemoglobin (Hb A1C) (Completed)   POCT urinalysis dipstick (Completed)   Hemoglobin A1C greater than 9%, indicating poor diabetic control     Other   Hyperglycemia    Other Visit Diagnoses    Class 2 severe obesity due to excess calories with serious comorbidity and body mass index (BMI) of 35.0 to 35.9 in adult [facility])       Contusion of left foot, subsequent encounter  Follow up          No orders of the defined types were placed in this  encounter.   Follow-up: No follow-ups on file.    Azzie Glatter, FNP

## 2019-05-26 ENCOUNTER — Ambulatory Visit: Payer: Medicaid Other | Admitting: Family Medicine

## 2019-05-26 ENCOUNTER — Encounter: Payer: Self-pay | Admitting: Podiatry

## 2019-05-26 ENCOUNTER — Ambulatory Visit: Payer: No Typology Code available for payment source | Admitting: Podiatry

## 2019-05-26 ENCOUNTER — Other Ambulatory Visit: Payer: Self-pay

## 2019-05-26 VITALS — Temp 97.0°F

## 2019-05-26 DIAGNOSIS — E1165 Type 2 diabetes mellitus with hyperglycemia: Secondary | ICD-10-CM

## 2019-05-26 DIAGNOSIS — M79672 Pain in left foot: Secondary | ICD-10-CM

## 2019-05-26 DIAGNOSIS — E11610 Type 2 diabetes mellitus with diabetic neuropathic arthropathy: Secondary | ICD-10-CM

## 2019-05-27 ENCOUNTER — Encounter: Payer: Self-pay | Admitting: Family Medicine

## 2019-05-28 ENCOUNTER — Encounter: Payer: Self-pay | Admitting: Podiatry

## 2019-05-28 NOTE — Progress Notes (Signed)
Subjective:  Patient ID: Micheal Neal, male    DOB: Apr 08, 1970,  MRN: [id number]  Chief Complaint  Patient presents with  . Charcot    Follow-up; Left foot; pt stated, "Foot feels the same"; pt Diabetic Type 2; Sugar=200 this am; A1C=pt stated,"9.6"    49 y.o. male presents with the above complaint.  P patient is a follow-up of 4 weeks from a left plantar medial forefoot pain at first tarsometatarsal joint because of Charcot breakdown.  Patient had a new month MRI evaluate that confirmed it.  Patient states that there is some pain and swelling associated with it.  He has been ambulating with a cam boot he has tried not to put any weight on it but it is difficult to do so.  He is working on obtaining a Midwife.  He has some financial restrictions and is awaiting a tax refund.  He denies any other acute complaints.   Review of Systems: Negative except as noted in the HPI. Denies N/V/F/Ch.  Past Medical History:  Diagnosis Date  . Contusion of left foot   . Diabetes mellitus without complication (HCC)     Current Outpatient Medications:  .  aspirin EC 81 MG tablet, Take 1 tablet (81 mg total) by mouth daily., Disp: 30 tablet, Rfl: 11 .  glipiZIDE (GLUCOTROL XL) 10 MG 24 hr tablet, Take 1 tablet (10 mg total) by mouth daily with breakfast., Disp: 90 tablet, Rfl: 2 .  insulin glargine (LANTUS) 100 UNIT/ML injection, Inject 10 Units into the skin at bedtime., Disp: , Rfl:  .  insulin lispro (HUMALOG KWIKPEN) 100 UNIT/ML KiwkPen, Inject 0.1 mLs (10 Units total) into the skin 3 (three) times daily., Disp: 15 mL, Rfl: 11 .  Insulin Syringe-Needle U-100 (BD INSULIN SYRINGE ULTRAFINE) 31G X 5/16" 0.5 ML MISC, 1 each by Does not apply route 4 (four) times daily -  before meals and at bedtime., Disp: 100 each, Rfl: 11 .  LANTUS SOLOSTAR 100 UNIT/ML Solostar Pen, INJECT 55 UNITS INTO THE SKIN AT BEDTIME., Disp: 45 mL, Rfl: 11 .  lisinopril (ZESTRIL) 2.5 MG tablet, TAKE 1 TABLET BY MOUTH DAILY.,  Disp: 30 tablet, Rfl: 5 .  rosuvastatin (CRESTOR) 20 MG tablet, Take 1 tablet (20 mg total) by mouth daily., Disp: 90 tablet, Rfl: 1 .  TRUEPLUS PEN NEEDLES 31G X 5 MM MISC, USE AS DIRECTED FOUR TIMES DAILY, Disp: 100 each, Rfl: 11  Social History   Tobacco Use  Smoking Status Never Smoker  Smokeless Tobacco Never Used    No Known Allergies Objective:   Vitals:   05/26/19 1016  Temp: (!) 97 F (36.1 C)   There is no height or weight on file to calculate BMI. Constitutional Well developed. Well nourished.  Vascular Dorsalis pedis pulses palpable bilaterally. Posterior tibial pulses palpable bilaterally. Capillary refill normal to all digits.  No cyanosis or clubbing noted. Pedal hair growth normal.  Neurologic Normal speech. Oriented to person, place, and time. Epicritic sensation to light touch grossly present bilaterally.  Dermatologic Nails well groomed and normal in appearance. No open wounds or Charcot deformation noted yet. No skin lesions.  Orthopedic:  Pain on palpation to the first tarsometatarsal joint.  No pain with range of motion.  Pain extends distally to the submetatarsal one.  No pain along the posterior tibial tendon including its insertion.  No pain with eversion or inversion of the foot.   Radiographs: None Assessment:   1. Uncontrolled type 2 diabetes mellitus with hyperglycemia (  [place])   2. Pain in left foot   3. Charcot foot due to diabetes mellitus ([place])    Plan:  Patient was evaluated and treated and all questions answered.  Left plantar midfoot pain in the medial arch/soft tissue contusion -Patient was evaluated clinically, at this time I am not concerned for any acute skin breakdown.  However patient is still in the acute phase of Charcot with pain swelling associated to the midfoot.  Patient has been ambulating in a cam boot while he obtains Copywriter, advertising.  Patient has already been casted for a [place] walker.  He is getting his financial in order to  obtain the [facility] walker.  He states he should be able to get it in next 2 to 3 weeks. -I have asked the patient to continue being nonweightbearing to the left lower extremity to allow for Charcot to go through different phases. -I will see him back again in 4 weeks and will plan to get another set of x-rays.   Return in about 4 weeks (around 06/23/2019).

## 2019-05-31 MED FILL — !LANTUS SOLOSTAR 100UNITS/M: 100 | 15 days supply | Qty: 9 | Fill #1

## 2019-05-31 MED FILL — glipiZIDE XL 10 MG TB24: 10 | 30 days supply | Qty: 30 | Fill #4

## 2019-05-31 MED FILL — ROSUVASTATIN CALCIUM 20 MG: 20 | 30 days supply | Qty: 30 | Fill #3

## 2019-05-31 MED FILL — LISINOPRIL 2.5 MG TABLET: 2.5 | 30 days supply | Qty: 30 | Fill #1

## 2019-06-01 MED FILL — TRUEPLUS 5-BEVEL PEN NEEDLE: 31G X 5 MM | 25 days supply | Qty: 100 | Fill #0

## 2019-06-16 MED FILL — TRUEPLUS 5-BEVEL PEN NEEDLE: 31G X 5 MM | 25 days supply | Qty: 100 | Fill #0

## 2019-06-23 ENCOUNTER — Ambulatory Visit: Payer: Self-pay | Admitting: Podiatry

## 2019-07-07 ENCOUNTER — Other Ambulatory Visit: Payer: Self-pay | Admitting: Family Medicine

## 2019-07-07 DIAGNOSIS — E1165 Type 2 diabetes mellitus with hyperglycemia: Secondary | ICD-10-CM

## 2019-07-07 MED FILL — !LANTUS SOLOSTAR 100UNITS/M: 100 | 15 days supply | Qty: 9 | Fill #2

## 2019-07-07 MED FILL — $HUMALOG 100 UNITS/ML KWIKP: 100 | 66 days supply | Qty: 12 | Fill #0

## 2019-07-21 ENCOUNTER — Ambulatory Visit: Payer: Medicaid Other | Admitting: Podiatry

## 2019-07-21 ENCOUNTER — Ambulatory Visit (INDEPENDENT_AMBULATORY_CARE_PROVIDER_SITE_OTHER): Payer: Medicaid Other

## 2019-07-21 ENCOUNTER — Other Ambulatory Visit: Payer: Self-pay

## 2019-07-21 DIAGNOSIS — E1165 Type 2 diabetes mellitus with hyperglycemia: Secondary | ICD-10-CM

## 2019-07-21 DIAGNOSIS — E11610 Type 2 diabetes mellitus with diabetic neuropathic arthropathy: Secondary | ICD-10-CM

## 2019-07-21 DIAGNOSIS — M79672 Pain in left foot: Secondary | ICD-10-CM

## 2019-07-21 DIAGNOSIS — M14672 Charcot's joint, left ankle and foot: Secondary | ICD-10-CM

## 2019-07-22 NOTE — Progress Notes (Signed)
Subjective:  Patient ID: Micheal Neal, male    DOB: Jul 07, 1970,  MRN: [id number]  Chief Complaint  Patient presents with  . Diabetes Mellitus    Pt states pain in his left foot has gradually decreased but the swelling is still present and has not changed.    49 y.o. male presents with the above complaint.  Patient presents for general follow-up from left Charcot foot.  He states he is doing well.  His pain has gone little bit down and the swelling has decreased a little bit.  Overall he is doing much better.  He has been ambulating with a cam boot.  He has not been able to obtain the [facility] walker due to financial restrictions.  He is working on getting the money to get the [employer].  He denies any other acute complaints.  He is here today for monitor progression of Charcot.   Review of Systems: Negative except as noted in the HPI. Denies N/V/F/Ch.  Past Medical History:  Diagnosis Date  . Contusion of left foot   . Diabetes mellitus without complication (HCC)     Current Outpatient Medications:  .  aspirin EC 81 MG tablet, Take 1 tablet (81 mg total) by mouth daily., Disp: 30 tablet, Rfl: 11 .  glipiZIDE (GLUCOTROL XL) 10 MG 24 hr tablet, Take 1 tablet (10 mg total) by mouth daily with breakfast., Disp: 90 tablet, Rfl: 2 .  HUMALOG KWIKPEN 100 UNIT/ML KwikPen, INJECT 0.06 MLS (6 UNITS TOTAL) INTO THE SKIN 3 (THREE) TIMES DAILY., Disp: 18 mL, Rfl: 11 .  insulin glargine (LANTUS) 100 UNIT/ML injection, Inject 10 Units into the skin at bedtime., Disp: , Rfl:  .  insulin lispro (HUMALOG KWIKPEN) 100 UNIT/ML KiwkPen, Inject 0.1 mLs (10 Units total) into the skin 3 (three) times daily., Disp: 15 mL, Rfl: 11 .  Insulin Syringe-Needle U-100 (BD INSULIN SYRINGE ULTRAFINE) 31G X 5/16" 0.5 ML MISC, 1 each by Does not apply route 4 (four) times daily -  before meals and at bedtime., Disp: 100 each, Rfl: 11 .  LANTUS SOLOSTAR 100 UNIT/ML Solostar Pen, INJECT 55 UNITS INTO THE SKIN AT BEDTIME., Disp:  45 mL, Rfl: 11 .  lisinopril (ZESTRIL) 2.5 MG tablet, TAKE 1 TABLET BY MOUTH DAILY., Disp: 30 tablet, Rfl: 5 .  rosuvastatin (CRESTOR) 20 MG tablet, Take 1 tablet (20 mg total) by mouth daily., Disp: 90 tablet, Rfl: 1 .  TRUEPLUS PEN NEEDLES 31G X 5 MM MISC, USE AS DIRECTED FOUR TIMES DAILY, Disp: 100 each, Rfl: 11  Social History   Tobacco Use  Smoking Status Never Smoker  Smokeless Tobacco Never Used    No Known Allergies Objective:   There were no vitals filed for this visit. There is no height or weight on file to calculate BMI. Constitutional Well developed. Well nourished.  Vascular Dorsalis pedis pulses palpable bilaterally. Posterior tibial pulses palpable bilaterally. Capillary refill normal to all digits.  No cyanosis or clubbing noted. Pedal hair growth normal.  Neurologic Normal speech. Oriented to person, place, and time. Epicritic sensation to light touch grossly present bilaterally.  Dermatologic Nails well groomed and normal in appearance. No open wounds or Charcot deformation noted yet. No skin lesions.  Orthopedic:  Mild pain on palpation to the first tarsometatarsal joint.  No pain with range of motion.  Mild pain extends distally to the submetatarsal one.  No pain along the posterior tibial tendon including its insertion.  No pain with eversion or inversion of the  foot.   Radiographs: 3 views of skeletally mature adult left foot.  Compared to prior x-rays no interval changes noted.  The first tarsometatarsal joint as well as the midfoot appear to be in stable position. Assessment:   1. Uncontrolled type 2 diabetes mellitus with hyperglycemia (HCC)   2. Pain in left foot   3. Charcot foot due to diabetes mellitus ([place])    Plan:  Patient was evaluated and treated and all questions answered.  Left plantar midfoot pain in the medial arch/soft tissue contusion secondary Charcot~stabilizing -Patient was evaluated clinically, at this time I am not concerned for  any acute skin breakdown.  It appears the patient may have transition more of the consolidation phase of Charcot.  Given the x-ray findings, at this time I am not too worried about acute breakdown.  That he has been stable for past couple months.  Patient has been ambulating in a cam boot while he obtains Copywriter, advertising.  He has not been compliant with nonweightbearing status.  Patient has already been casted for a [place] walker.  He is getting his financial in order to obtain the [facility] walker.  He will try to obtain as soon as he can. -I have asked the patient to continue being nonweightbearing to the left lower extremity to allow for Charcot to go through different phases. -I will see him back again in 6 weeks and will plan to get another set of x-rays.   No follow-ups on file.

## 2019-07-23 ENCOUNTER — Encounter: Payer: Self-pay | Admitting: Podiatry

## 2019-07-26 MED FILL — ROSUVASTATIN CALCIUM 20 MG: 20 | 30 days supply | Qty: 30 | Fill #4

## 2019-07-26 MED FILL — !LANTUS SOLOSTAR 100UNITS/M: 100 | 24 days supply | Qty: 15 | Fill #3

## 2019-07-26 MED FILL — glipiZIDE XL 10 MG TB24: 10 | 30 days supply | Qty: 30 | Fill #5

## 2019-07-26 MED FILL — LISINOPRIL 2.5 MG TABLET: 2.5 | 30 days supply | Qty: 30 | Fill #2

## 2019-08-23 ENCOUNTER — Ambulatory Visit: Payer: Medicaid Other | Admitting: Nurse Practitioner

## 2019-08-24 ENCOUNTER — Other Ambulatory Visit: Payer: Self-pay | Admitting: Family Medicine

## 2019-08-24 DIAGNOSIS — E785 Hyperlipidemia, unspecified: Secondary | ICD-10-CM

## 2019-08-24 DIAGNOSIS — E1165 Type 2 diabetes mellitus with hyperglycemia: Secondary | ICD-10-CM

## 2019-08-24 MED FILL — $LANTUS SOLOSTAR 100 UNITS/: 100 | 24 days supply | Qty: 15 | Fill #4

## 2019-08-24 MED FILL — glipiZIDE XL 10 MG TB24: 10 | 30 days supply | Qty: 30 | Fill #0

## 2019-08-24 MED FILL — LISINOPRIL 2.5 MG TABLET: 2.5 | 30 days supply | Qty: 30 | Fill #3

## 2019-08-24 MED FILL — ROSUVASTATIN CALCIUM 20 MG: 20 | 30 days supply | Qty: 30 | Fill #0

## 2019-09-06 ENCOUNTER — Ambulatory Visit: Payer: Medicaid Other | Admitting: Podiatry

## 2019-10-11 MED FILL — glipiZIDE XL 10 MG TB24: 10 | 30 days supply | Qty: 30 | Fill #1

## 2019-10-11 MED FILL — LISINOPRIL 2.5 MG TABLET: 2.5 | 30 days supply | Qty: 30 | Fill #4

## 2019-10-11 MED FILL — $LANTUS SOLOSTAR 100 UNITS/: 100 | 24 days supply | Qty: 15 | Fill #5

## 2019-10-11 MED FILL — ROSUVASTATIN CALCIUM 20 MG: 20 | 30 days supply | Qty: 30 | Fill #1

## 2019-11-08 ENCOUNTER — Other Ambulatory Visit: Payer: Self-pay

## 2019-11-08 ENCOUNTER — Ambulatory Visit
Admission: EM | Admit: 2019-11-08 | Discharge: 2019-11-08 | Disposition: A | Discharge status: Home / Self Care | Payer: Medicaid Other | Attending: Emergency Medicine | Admitting: Emergency Medicine

## 2019-11-08 DIAGNOSIS — M545 Low back pain, unspecified: Secondary | ICD-10-CM

## 2019-11-08 LAB — POCT URINALYSIS DIP (MANUAL ENTRY)
Bilirubin, UA: NEGATIVE
Blood, UA: NEGATIVE
Glucose, UA: >=1000 mg/dL — AB
Ketones, POC UA: NEGATIVE mg/dL
Leukocytes, UA: NEGATIVE
Nitrite, UA: NEGATIVE
Protein Ur, POC: NEGATIVE mg/dL
Spec Grav, UA: 1.015 (ref 1.010–1.025)
Urobilinogen, UA: 0.2 E.U./dL
pH, UA: 5 (ref 5.0–8.0)

## 2019-11-08 MED ORDER — CYCLOBENZAPRINE HCL 5 MG PO TABS: 5.0000 mg | ORAL_TABLET | Freq: Three times a day (TID) | ORAL | 0 refills | Status: DC | PRN

## 2019-11-08 MED ORDER — ACETAMINOPHEN 500 MG PO TABS: 500.0000 mg | ORAL_TABLET | Freq: Four times a day (QID) | ORAL | 0 refills | Status: DC | PRN

## 2019-11-08 MED ORDER — KETOROLAC TROMETHAMINE 30 MG/ML IJ SOLN
30.0000 mg | Freq: Once | INTRAMUSCULAR | Status: AC
  Administered 2019-11-08: 30 mg via INTRAMUSCULAR

## 2019-11-08 MED ORDER — PREDNISONE 10 MG (21) PO TBPK: ORAL_TABLET | ORAL | 0 refills | Status: DC

## 2019-11-08 MED ORDER — DEXAMETHASONE SODIUM PHOSPHATE 10 MG/ML IJ SOLN
10.0000 mg | Freq: Once | INTRAMUSCULAR | Status: AC
  Administered 2019-11-08: 10 mg via INTRAMUSCULAR

## 2019-11-08 MED FILL — predniSONE 10 MG TABS: 10 | 6 days supply | Qty: 21 | Fill #0

## 2019-11-08 MED FILL — CYCLOBENZAPRINE 5 MG TABLET: 5 | 10 days supply | Qty: 30 | Fill #0

## 2019-11-08 NOTE — ED Triage Notes (Signed)
Pt presents with right side back pain that developed on Thursday, denies injury

## 2019-11-08 NOTE — ED Provider Notes (Signed)
[facility]   [id number] 11/08/19 Arrival Time: 1448   Chief Complaint  Patient presents with  . Back Pain     SUBJECTIVE: History from: patient and family.  Micheal Neal is a 49 y.o. male who presented to the urgent care for complaint of right-sided back pain for the past 4 days.  Denies any precipitating event, trauma or injury.  He localizes the pain to the right low back.  He describes the pain as constant and achy.  He has tried OTC medications without relief.  His symptoms are made worse with ROM.  He denies similar symptoms in the past.  Denies chills, fever, nausea, vomiting, diarrhea, tingling, numbness, paresthesia.   ROS: As per HPI.  All other pertinent ROS negative.      Past Medical History:  Diagnosis Date  . Contusion of left foot   . Diabetes mellitus without complication (HCC)    History reviewed. No pertinent surgical history. No Known Allergies No current facility-administered medications on file prior to encounter.   Current Outpatient Medications on File Prior to Encounter  Medication Sig Dispense Refill  . aspirin EC 81 MG tablet Take 1 tablet (81 mg total) by mouth daily. 30 tablet 11  . GLIPIZIDE XL 10 MG 24 hr tablet TAKE 1 TABLET BY MOUTH DAILY WITH BREAKFAST. 30 tablet 2  . HUMALOG KWIKPEN 100 UNIT/ML KwikPen INJECT 0.06 MLS (6 UNITS TOTAL) INTO THE SKIN 3 (THREE) TIMES DAILY. 18 mL 11  . insulin glargine (LANTUS) 100 UNIT/ML injection Inject 10 Units into the skin at bedtime.    . insulin lispro (HUMALOG KWIKPEN) 100 UNIT/ML KiwkPen Inject 0.1 mLs (10 Units total) into the skin 3 (three) times daily. 15 mL 11  . Insulin Syringe-Needle U-100 (BD INSULIN SYRINGE ULTRAFINE) 31G X 5/16" 0.5 ML MISC 1 each by Does not apply route 4 (four) times daily -  before meals and at bedtime. 100 each 11  . LANTUS SOLOSTAR 100 UNIT/ML Solostar Pen INJECT 55 UNITS INTO THE SKIN AT BEDTIME. 45 mL 11  . lisinopril (ZESTRIL) 2.5 MG tablet TAKE 1 TABLET BY  MOUTH DAILY. 30 tablet 5  . rosuvastatin (CRESTOR) 20 MG tablet TAKE 1 TABLET BY MOUTH DAILY. 30 tablet 1  . TRUEPLUS PEN NEEDLES 31G X 5 MM MISC USE AS DIRECTED FOUR TIMES DAILY 100 each 11   Social History   Socioeconomic History  . Marital status: Married    Spouse name: Not on file  . Number of children: Not on file  . Years of education: Not on file  . Highest education level: Not on file  Occupational History  . Not on file  Tobacco Use  . Smoking status: Never Smoker  . Smokeless tobacco: Never Used  Vaping Use  . Vaping Use: Never used  Substance and Sexual Activity  . Alcohol use: No  . Drug use: No  . Sexual activity: Yes    Partners: Female  Other Topics Concern  . Not on file  Social History Narrative  . Not on file   Social Determinants of Health   Financial Resource Strain:   . Difficulty of Paying Living Expenses:   Food Insecurity:   . Worried About Programme researcher, broadcasting/film/video in the Last Year:   . Barista in the Last Year:   Transportation Needs:   . Freight forwarder (Medical):   Marland Kitchen Lack of Transportation (Non-Medical):   Physical Activity:   . Days of Exercise per Week:   .  Minutes of Exercise per Session:   Stress:   . Feeling of Stress :   Social Connections:   . Frequency of Communication with Friends and Family:   . Frequency of Social Gatherings with Friends and Family:   . Attends Religious Services:   . Active Member of Clubs or Organizations:   . Attends Banker Meetings:   Marland Kitchen Marital Status:   Intimate Partner Violence:   . Fear of Current or Ex-Partner:   . Emotionally Abused:   Marland Kitchen Physically Abused:   . Sexually Abused:    Family History  Problem Relation Age of Onset  . Breast cancer Maternal Aunt   . Breast cancer Maternal Grandmother        diagnosed late 11's    OBJECTIVE:  Vitals:   11/08/19 1524  BP: 112/81  Pulse: 82  Resp: 18  Temp: 98.4 F (36.9 C)  SpO2: 98%     Physical Exam Vitals and  nursing note reviewed.  Constitutional:      General: He is not in acute distress.    Appearance: Normal appearance. He is normal weight. He is not ill-appearing, toxic-appearing or diaphoretic.  Cardiovascular:     Rate and Rhythm: Normal rate and regular rhythm.     Pulses: Normal pulses.     Heart sounds: Normal heart sounds. No murmur heard.  No friction rub. No gallop.   Pulmonary:     Effort: Pulmonary effort is normal. No respiratory distress.     Breath sounds: Normal breath sounds. No stridor. No wheezing, rhonchi or rales.  Chest:     Chest wall: No tenderness.  Musculoskeletal:     Cervical back: Normal.     Thoracic back: Normal.     Lumbar back: Spasms and tenderness present.     Comments: Back:  Patient ambulates from chair to exam table without difficulty.  Inspection: Skin clear and intact without obvious swelling, erythema, or ecchymosis. Warm to the touch  Palpation: Vertebral processes nontender. Tenderness about the lower right paravertebral muscles  ROM: FROM Strength: 5/5 hip flexion, 5/5 knee extension, 5/5 knee flexion, 5/5 plantar flexion, 5/5 dorsiflexion  Special Tests: Negative Straight leg raise  Neurological:     Mental Status: He is alert and oriented to person, place, and time.     LABS:  Results for orders placed or performed during the hospital encounter of 11/08/19 (from the past 24 hour(s))  POCT urinalysis dipstick     Status: Abnormal   Collection Time: 11/08/19  3:28 PM  Result Value Ref Range   Color, UA yellow yellow   Clarity, UA clear clear   Glucose, UA >=1,000 (A) negative mg/dL   Bilirubin, UA negative negative   Ketones, POC UA negative negative mg/dL   Spec Grav, UA 4.332 9.518 - 1.025   Blood, UA negative negative   pH, UA 5.0 5.0 - 8.0   Protein Ur, POC negative negative mg/dL   Urobilinogen, UA 0.2 0.2 or 1.0 E.U./dL   Nitrite, UA Negative Negative   Leukocytes, UA Negative Negative     ASSESSMENT & PLAN:  1. Acute  right-sided low back pain without sciatica     Meds ordered this encounter  Medications  . acetaminophen (TYLENOL) 500 MG tablet    Sig: Take 1 tablet (500 mg total) by mouth every 6 (six) hours as needed.    Dispense:  30 tablet    Refill:  0  . predniSONE (STERAPRED UNI-PAK 21 TAB) 10 MG (  21) TBPK tablet    Sig: Take 6 tabs by mouth daily  for 1 days, then 5 tabs for 1 days, then 4 tabs for 1 days, then 3 tabs for 1days, 2 tabs for 1 days, then 1 tab by mouth daily for 1 days    Dispense:  21 tablet    Refill:  0  . cyclobenzaprine (FLEXERIL) 5 MG tablet    Sig: Take 1 tablet (5 mg total) by mouth 3 (three) times daily as needed.    Dispense:  30 tablet    Refill:  0  . dexamethasone (DECADRON) injection 10 mg  . ketorolac (TORADOL) 30 MG/ML injection 30 mg    Discharge instructions POCT urinalysis was negative for UTI Rest, ice and heat as needed Ensure adequate ROM as tolerated. Prescribed Tylenol as needed for  pain relief Prescribed flexeril  for muscle spasm.  Do not drive or operate heavy machinery while taking this medication Prescribed prednisone for inflammation Return here or go to ER if you have any new or worsening symptoms such as numbness/tingling of the inner thighs, loss of bladder or bowel control, headache/blurry vision, nausea/vomiting, confusion/altered mental status, dizziness, weakness, passing out, imbalance, etc...    Reviewed expectations re: course of current medical issues. Questions answered. Outlined signs and symptoms indicating need for more acute intervention. Patient verbalized understanding. After Visit Summary given.     Note: This document was prepared using Dragon voice recognition software and may include unintentional dictation errors.     Durward Parcel, FNP 11/08/19 1547

## 2019-11-08 NOTE — Discharge Instructions (Addendum)
POCT urinalysis was negative for UTI Rest, ice and heat as needed Ensure adequate ROM as tolerated. Prescribed Tylenol as needed for  pain relief Prescribed flexeril  for muscle spasm.  Do not drive or operate heavy machinery while taking this medication Prescribed prednisone for inflammation Return here or go to ER if you have any new or worsening symptoms such as numbness/tingling of the inner thighs, loss of bladder or bowel control, headache/blurry vision, nausea/vomiting, confusion/altered mental status, dizziness, weakness, passing out, imbalance, etc..Marland Kitchen

## 2020-02-14 ENCOUNTER — Other Ambulatory Visit: Payer: Self-pay | Admitting: Family Medicine

## 2020-02-14 DIAGNOSIS — E785 Hyperlipidemia, unspecified: Secondary | ICD-10-CM

## 2020-02-14 MED FILL — LISINOPRIL 2.5 MG TABLET: 2.5 | 30 days supply | Qty: 30 | Fill #5

## 2020-02-14 MED FILL — glipiZIDE XL 10 MG TB24: 10 | 30 days supply | Qty: 30 | Fill #2

## 2020-02-14 MED FILL — $LANTUS SOLOSTAR 100 UNITS/: 100 | 24 days supply | Qty: 15 | Fill #6

## 2020-03-10 NOTE — Telephone Encounter (Signed)
Please advise on medication refill, last labs were drawn on 10/2018.

## 2020-04-05 ENCOUNTER — Other Ambulatory Visit: Payer: Self-pay | Admitting: Family Medicine

## 2020-04-05 DIAGNOSIS — E785 Hyperlipidemia, unspecified: Secondary | ICD-10-CM

## 2020-04-05 DIAGNOSIS — E1165 Type 2 diabetes mellitus with hyperglycemia: Secondary | ICD-10-CM

## 2020-04-05 MED FILL — $LANTUS SOLOSTAR 100 UNITS/: 100 | 27 days supply | Qty: 15 | Fill #7

## 2020-04-20 ENCOUNTER — Other Ambulatory Visit: Payer: Self-pay | Admitting: Family Medicine

## 2020-04-20 DIAGNOSIS — E785 Hyperlipidemia, unspecified: Secondary | ICD-10-CM

## 2020-04-20 DIAGNOSIS — E1165 Type 2 diabetes mellitus with hyperglycemia: Secondary | ICD-10-CM

## 2020-04-20 MED FILL — glipiZIDE XL 10 MG TB24: 10 | 30 days supply | Qty: 30 | Fill #0

## 2020-04-20 MED FILL — $LANTUS SOLOSTAR 100 UNITS/: 100 | 72 days supply | Qty: 45 | Fill #7

## 2020-04-20 MED FILL — LISINOPRIL 2.5 MG TABLET: 2.5 | 30 days supply | Qty: 30 | Fill #0

## 2020-04-20 MED FILL — ROSUVASTATIN CALCIUM 20 MG: 20 | 30 days supply | Qty: 30 | Fill #0

## 2020-08-10 ENCOUNTER — Other Ambulatory Visit: Payer: Self-pay

## 2020-10-30 ENCOUNTER — Other Ambulatory Visit: Payer: Self-pay

## 2020-11-11 IMAGING — MR MR FOOT*L* W/O CM
4 of 5 series · 14 of 40 positions shown · non-contrast
Comparison: Left foot x-rays dated March 31, 2019.

CLINICAL DATA: Left midfoot swelling. No known injury. Poorly
controlled diabetes.

EXAM:
MRI OF THE LEFT FOOT WITHOUT CONTRAST
TECHNIQUE: Multiplanar, multisequence MR imaging of the left midfoot was
performed. No intravenous contrast was administered.

[Series 3: T1 · coronal · 3.0mm · 0.22mm/px · 3 of 49 slices shown (1 of 2)]
[im 5/49]
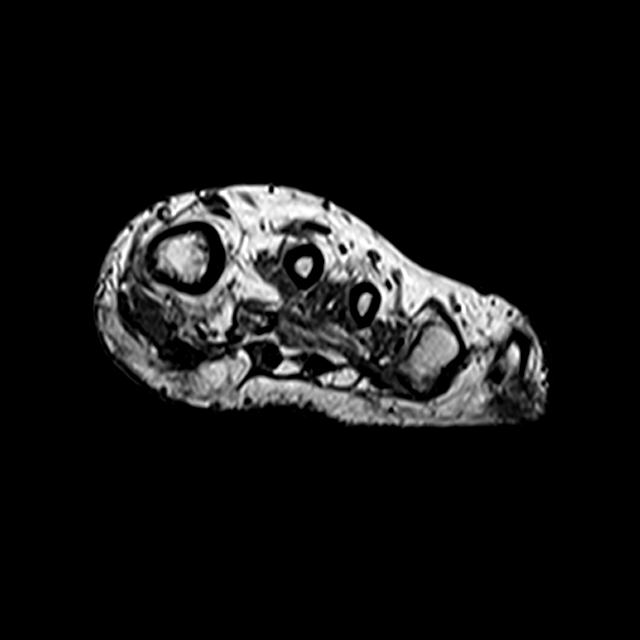
[im 25/49]
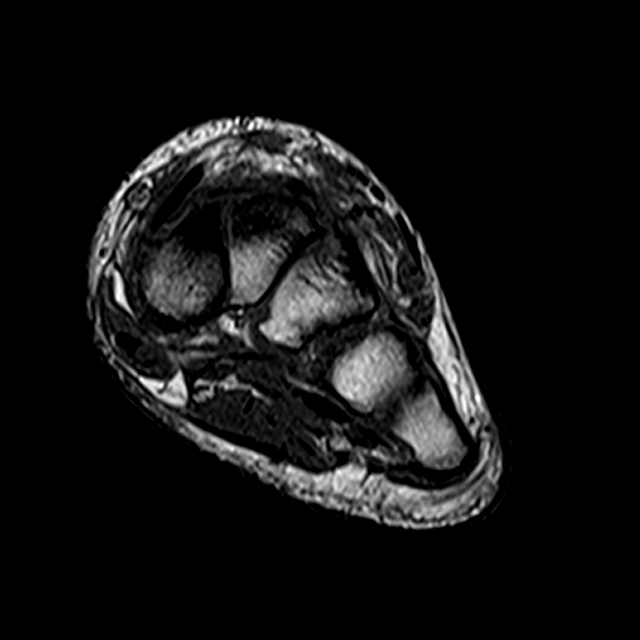
[im 44/49]
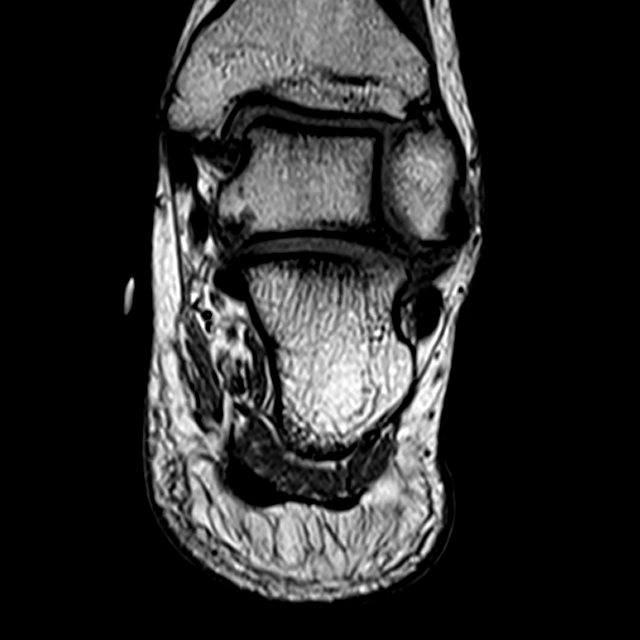

[Series 4: T2 fat-sat · coronal · 3.0mm · 0.22mm/px · 5 of 49 slices shown (1 of 2)]
[im 1/49]
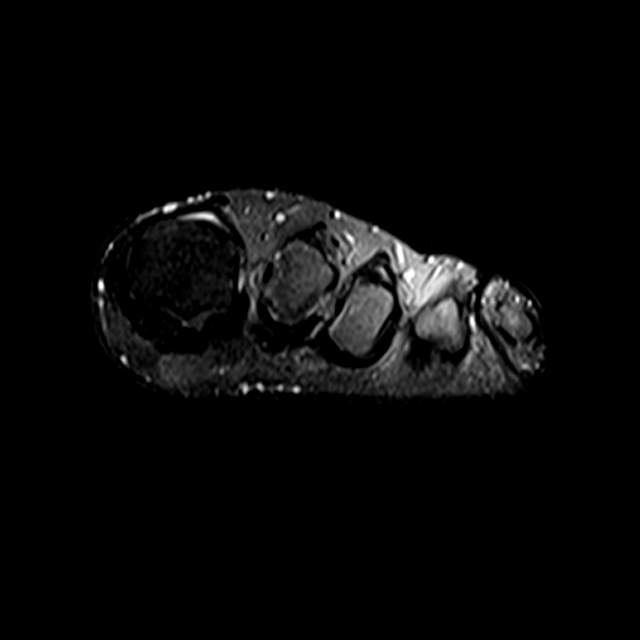
[im 5/49]
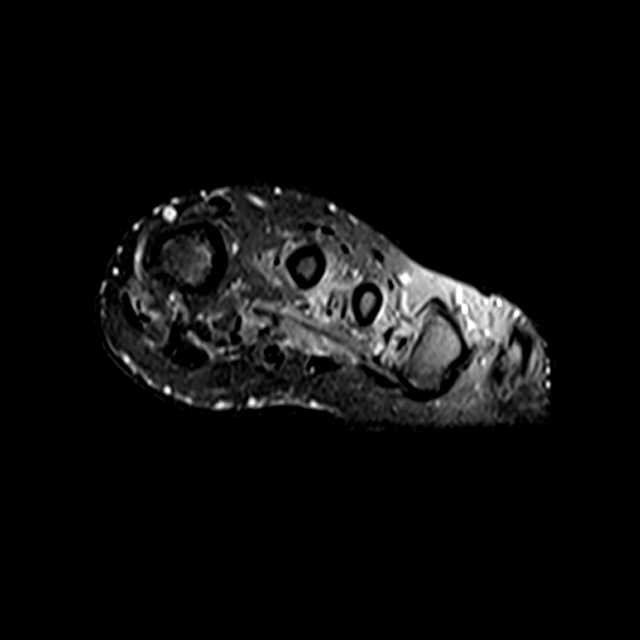
[im 10/49]
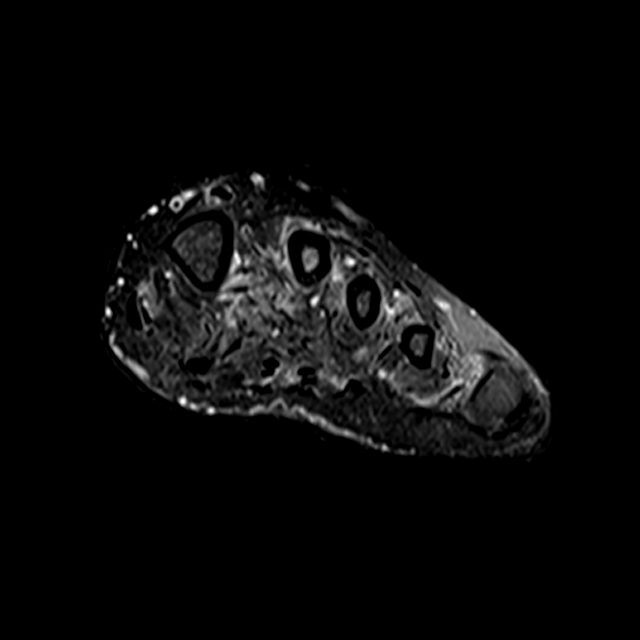
[im 25/49]
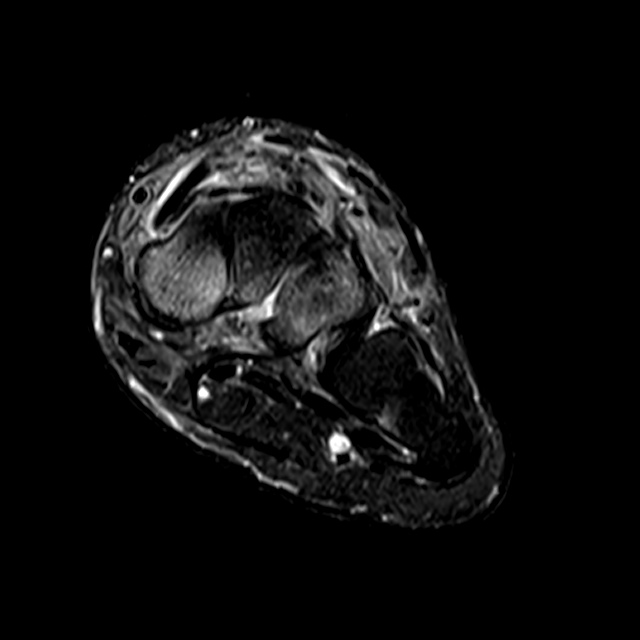
[im 44/49]
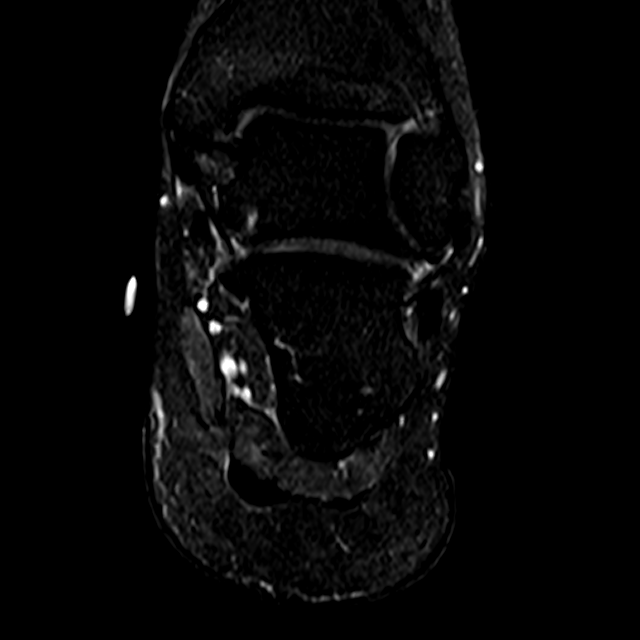

[Series 5: T2 fat-sat · axial · 3.0mm · 0.39mm/px · z∈[-32,+53]mm · 3 of 30 slices shown (2 of 2)]
[im 6/30]
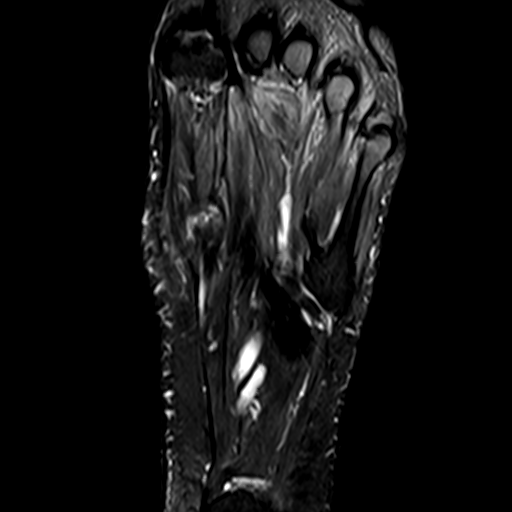
[im 18/30]
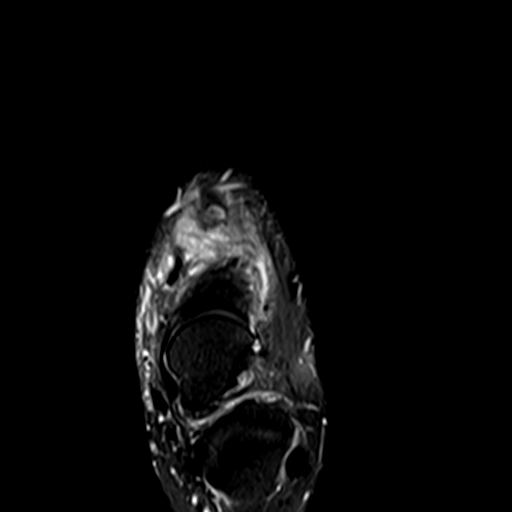
[im 30/30]
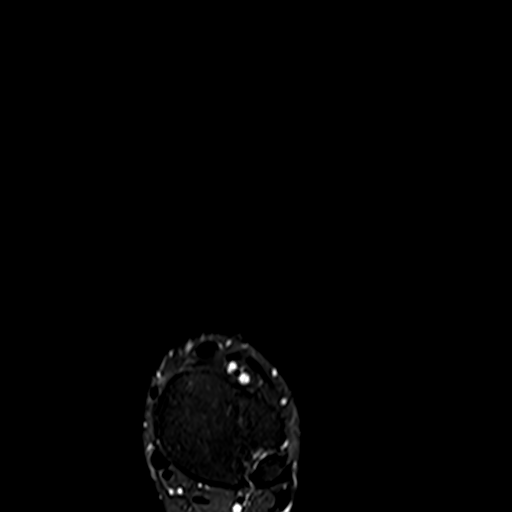

[Series 6: T1 · axial · 3.0mm · 0.31mm/px · z∈[-32,+53]mm · 3 of 30 slices shown (2 of 2)]
[im 6/30]
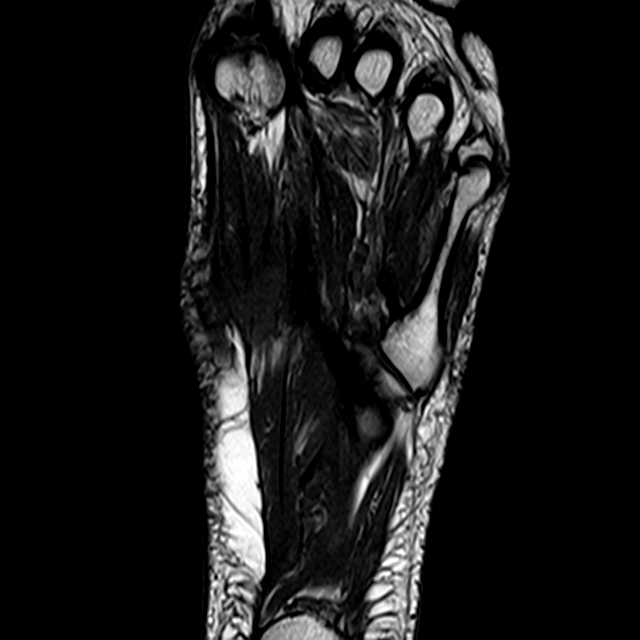
[im 18/30]
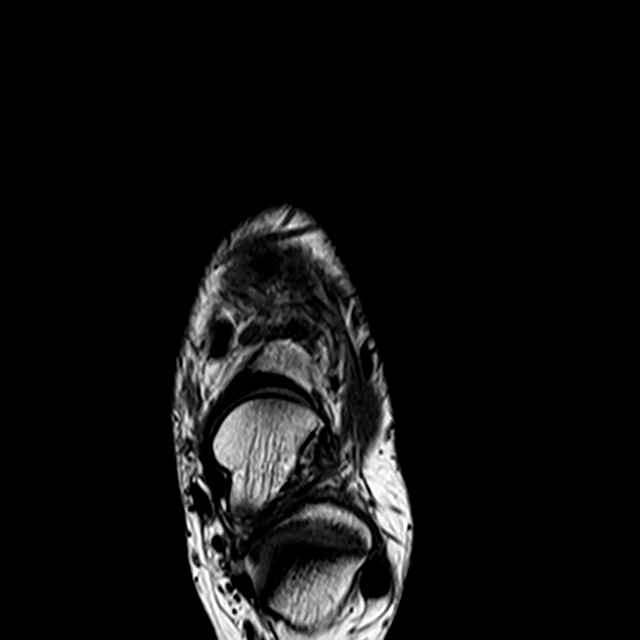
[im 30/30]
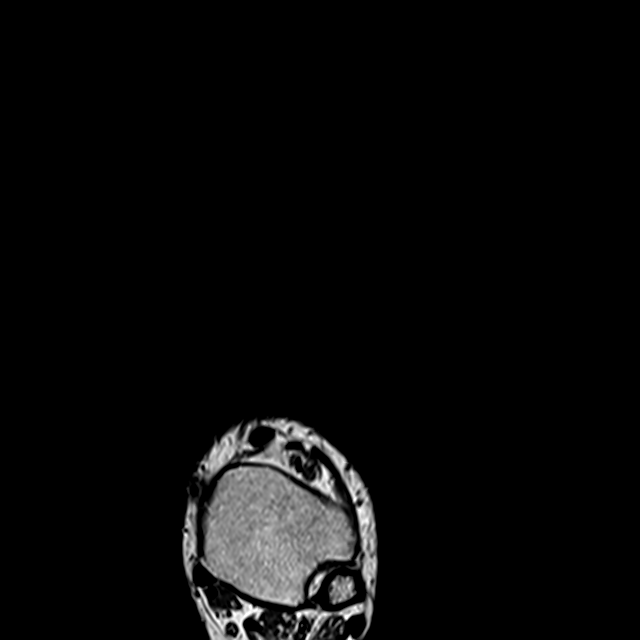

[14 of 40 positions shown; findings below may reference images not displayed]

FINDINGS: Bones/Joint/Cartilage

Prominent marrow edema in the medial cuneiform. Additional mild
marrow edema in the middle and lateral cuneiforms, base of the first
through third metatarsals, and first and second metatarsal shafts.
There is early fragmentation involving the superior aspect of the
medial cuneiform at the first TMT joint. Slight lateral subluxation
of the first TMT joint. Mild lateral dorsal subluxation of the
second TMT joint. Small calcaneocuboid joint effusion.

Ligaments

The Lisfranc ligament is torn. Deltoid, Trond Jarle, and lateral ankle
ligaments are intact.

Muscles and Tendons
Flexor, peroneal and extensor compartment tendons are intact. No
muscle edema or atrophy.

Soft tissue
Mild soft tissue swelling around the medial midfoot. No fluid
collection or hematoma. No soft tissue mass.
IMPRESSION: 1. Periarticular midfoot marrow edema involving the first through
third TMT joints with early fragmentation of the medial cuneiform,
torn Lisfranc ligament, and subluxation of the first and second TMT
joints. In the absence of trauma, findings are suspicious for
neuropathic arthropathy.
2. Marrow edema involving the first and second metatarsal shafts,
likely stress related.

## 2021-08-13 ENCOUNTER — Other Ambulatory Visit: Payer: Self-pay

## 2021-08-13 ENCOUNTER — Ambulatory Visit
Admission: EM | Admit: 2021-08-13 | Discharge: 2021-08-13 | Disposition: A | Discharge status: Home / Self Care | Payer: BC Managed Care – PPO | Attending: Emergency Medicine | Admitting: Emergency Medicine

## 2021-08-13 ENCOUNTER — Encounter: Payer: Self-pay | Admitting: Emergency Medicine

## 2021-08-13 DIAGNOSIS — R42 Dizziness and giddiness: Secondary | ICD-10-CM | POA: Insufficient documentation

## 2021-08-13 DIAGNOSIS — E1165 Type 2 diabetes mellitus with hyperglycemia: Secondary | ICD-10-CM

## 2021-08-13 DIAGNOSIS — E118 Type 2 diabetes mellitus with unspecified complications: Secondary | ICD-10-CM

## 2021-08-13 DIAGNOSIS — Z7984 Long term (current) use of oral hypoglycemic drugs: Secondary | ICD-10-CM

## 2021-08-13 DIAGNOSIS — R002 Palpitations: Secondary | ICD-10-CM | POA: Diagnosis not present

## 2021-08-13 LAB — POCT FASTING CBG KUC MANUAL ENTRY: POCT Glucose (KUC): 189 mg/dL — AB (ref 70–99)

## 2021-08-13 MED ORDER — GLIPIZIDE ER 10 MG PO TB24: 10.0000 mg | ORAL_TABLET | Freq: Every day | ORAL | 2 refills | Status: DC

## 2021-08-13 MED ORDER — INSULIN GLARGINE 100 UNIT/ML ~~LOC~~ SOLN
10.0000 [IU] | Freq: Every day | SUBCUTANEOUS | 1 refills | Status: DC
  Filled 2021-08-14: qty 10, 28d supply, fill #0
  Filled 2021-08-21: qty 10, 100d supply, fill #0
  Filled 2021-11-06: qty 10, 28d supply, fill #0

## 2021-08-13 MED ORDER — MECLIZINE HCL 25 MG PO TABS
25.0000 mg | ORAL_TABLET | Freq: Two times a day (BID) | ORAL | 0 refills | Status: AC | PRN
  Filled 2021-08-13 – 2021-08-29 (×4): qty 6, 3d supply, fill #0

## 2021-08-13 MED ORDER — INSULIN LISPRO (1 UNIT DIAL) 100 UNIT/ML (KWIKPEN)
100.0000 [IU] | PEN_INJECTOR | Freq: Three times a day (TID) | SUBCUTANEOUS | 11 refills | Status: DC
  Filled 2021-08-14 – 2021-11-14 (×3): qty 18, 6d supply, fill #0

## 2021-08-13 MED ORDER — TRUEPLUS PEN NEEDLES 31G X 5 MM MISC: 11 refills | Status: DC

## 2021-08-13 MED ORDER — "INSULIN SYRINGE-NEEDLE U-100 31G X 5/16"" 0.5 ML MISC": 1.0000 | Freq: Three times a day (TID) | 11 refills | Status: DC

## 2021-08-13 NOTE — ED Triage Notes (Addendum)
Dizziness x 1 month ?History of diabetes, cbg 383 per patient  ?Patient is taking humalog, out of lantus.  Nonspecific details related to pcp.  NP left, another NP took over and they are looking for a PCP currently ?

## 2021-08-13 NOTE — ED Provider Notes (Signed)
?[facility] ? ? ? ?CSN: [id number] ?Arrival date & time: 08/13/21  Micheal Neal ? ?  ? ?History   ?Chief Complaint ?Dizziness ?Medication Refill ? ?HPI ?Micheal Neal is a 51 y.o. male.  ?Patient presents with a 1 month history of dizziness.  Sates he has positional vertigo and feels like the room is spinning when he moves around and walks around.  He has had similar symptoms in the past but many years ago. ?He has a history of diabetes type 2 for which he takes medication but has not been taking everything as prescribed recently.  He has a change in primary care provider and is now looking for a new practice.  He would like refills on his diabetes medications. ?Additionally states heart palpitations occasionally over the past 2 months.  Family history of heart problems. No chest pain or tightness. ?Sugar in clinic today 189. ?Denies headache, vision changes or blurry vision, shortness of breath, abdominal pain, vomiting/diarrhea.  No urinary symptoms. He does regular foot exams and yearly eye appointments. ? ?Past Medical History:  ?Diagnosis Date  ? Contusion of left foot   ? Diabetes mellitus without complication (HCC)   ? ? ?Patient Active Problem List  ? Diagnosis Date Noted  ? Heart palpitations 08/13/2021  ? Dizziness 08/13/2021  ? Hemoglobin A1C greater than 9%, indicating poor diabetic control 02/24/2019  ? Hyperglycemia 02/24/2019  ? Hyperlipidemia 02/24/2019  ? Obesity (BMI 30-39.9) 05/24/2017  ? Vision changes 05/29/2016  ? Insomnia 10/15/2014  ? Diabetic neuropathy (HCC) 10/14/2014  ? BPPV (benign paroxysmal positional vertigo) 09/18/2014  ? Allergic rhinitis 07/01/2014  ? DM (diabetes mellitus), type 2, uncontrolled 02/20/2011  ? ? ?History reviewed. No pertinent surgical history. ? ? ?Home Medications   ? ?Prior to Admission medications   ?Medication Sig Start Date End Date Taking? Authorizing Provider  ?meclizine (ANTIVERT) 25 MG tablet Take 1 tablet (25 mg total) by mouth 2 (two) times daily as  needed for up to 3 days for dizziness. 08/13/21 08/16/21 Yes Jamichael Knotts, Lurena Joiner, PA-C  ?acetaminophen (TYLENOL) 500 MG tablet Take 1 tablet (500 mg total) by mouth every 6 (six) hours as needed. 11/08/19   Durward Parcel, FNP  ?glipiZIDE (GLUCOTROL XL) 10 MG 24 hr tablet Take 1 tablet (10 mg total) by mouth daily with breakfast. 08/13/21 11/11/21  Chontel Warning, Lurena Joiner, PA-C  ?insulin glargine (LANTUS) 100 UNIT/ML injection Inject 0.1 mLs (10 Units total) into the skin at bedtime. 08/13/21   Ardell Makarewicz, Lurena Joiner, PA-C  ?insulin lispro (HUMALOG KWIKPEN) 100 UNIT/ML KwikPen Inject 100 Units into the skin with breakfast, with lunch, and with evening meal. 08/13/21   Larae Caison, Lurena Joiner, PA-C  ?Insulin Pen Needle (TRUEPLUS PEN NEEDLES) 31G X 5 MM MISC USE AS DIRECTED FOUR TIMES DAILY 08/13/21   Glenda Kunst, Lurena Joiner, PA-C  ?Insulin Syringe-Needle U-100 (BD INSULIN SYRINGE ULTRAFINE) 31G X 5/16" 0.5 ML MISC 1 each by Does not apply route 4 (four) times daily -  before meals and at bedtime. 08/13/21   Ronit Marczak, Lurena Joiner, PA-C  ? ? ?Family History ?Family History  ?Problem Relation Age of Onset  ? Heart disease Mother   ? Breast cancer Maternal Grandmother   ?     diagnosed late 29's  ? Breast cancer Maternal Aunt   ? ? ?Social History ?Social History  ? ?Tobacco Use  ? Smoking status: Never  ? Smokeless tobacco: Never  ?Vaping Use  ? Vaping Use: Never used  ?Substance Use Topics  ? Alcohol use: No  ?  Drug use: No  ? ? ? ?Allergies   ?Patient has no known allergies. ? ? ?Review of Systems ?Review of Systems ?As per HPI ? ?Physical Exam ?Triage Vital Signs ?ED Triage Vitals  ?Enc Vitals Group  ?   BP 08/13/21 1959 (!) 84/55  ?   Pulse Rate 08/13/21 1959 92  ?   Resp 08/13/21 1959 20  ?   Temp 08/13/21 1959 98.3 ?F (36.8 ?C)  ?   Temp Source 08/13/21 1959 Oral  ?   SpO2 08/13/21 1959 97 %  ?   Weight --   ?   Height --   ?   Head Circumference --   ?   Peak Flow --   ?   Pain Score 08/13/21 1955 0  ?   Pain Loc --   ?   Pain Edu? --   ?   Excl. in GC? --    ? ?No data found. ? ?Updated Vital Signs ?BP (!) 84/55 (BP Location: Right Arm) Comment (BP Location): large cuff  Pulse 92   Temp 98.3 ?F (36.8 ?C) (Oral)   Resp 20   SpO2 97%  ?  ?100/66 repeat pressure ? ?Physical Exam ?Vitals and nursing note reviewed.  ?Constitutional:   ?   General: He is not in acute distress. ?HENT:  ?   Head: Normocephalic and atraumatic.  ?   Right Ear: Tympanic membrane and ear canal normal.  ?   Left Ear: Tympanic membrane and ear canal normal.  ?   Nose: Nose normal.  ?   Mouth/Throat:  ?   Mouth: Mucous membranes are moist.  ?   Pharynx: Oropharynx is clear.  ?Eyes:  ?   Extraocular Movements: Extraocular movements intact.  ?   Conjunctiva/sclera: Conjunctivae normal.  ?   Pupils: Pupils are equal, round, and reactive to light.  ?Cardiovascular:  ?   Rate and Rhythm: Normal rate and regular rhythm.  ?   Heart sounds: Normal heart sounds.  ?Pulmonary:  ?   Effort: Pulmonary effort is normal. No respiratory distress.  ?   Breath sounds: Normal breath sounds.  ?Abdominal:  ?   Palpations: Abdomen is soft.  ?   Tenderness: There is no abdominal tenderness.  ?Musculoskeletal:     ?   General: Normal range of motion.  ?   Cervical back: Normal range of motion.  ?   Comments: Some dizziness is reproduced with quick motion of the head from side to side.  ?Neurological:  ?   Mental Status: He is alert and oriented to person, place, and time.  ?   Sensory: Sensation is intact. No sensory deficit.  ?   Motor: Motor function is intact. No weakness.  ?   Coordination: Coordination is intact. Romberg sign negative. Coordination normal. Finger-Nose-Finger Test and Heel to [facility] Test normal.  ?   Gait: Gait is intact. Gait normal.  ?   Comments: Neurologically intact. ?5 out of 5 strength in all extremities ?Vertigo is only reproduced with quick motion of the head.  ? ? ?UC Treatments / Results  ?Labs ?(all labs ordered are listed, but only abnormal results are displayed) ?Labs Reviewed  ?POCT  FASTING CBG [facility] MANUAL ENTRY - Abnormal; Notable for the following components:  ?    Result Value  ? POCT Glucose ([facility]) 189 (*)   ? All other components within normal limits  ?CBC WITH DIFFERENTIAL/PLATELET  ?COMPREHENSIVE METABOLIC PANEL  ? ? ?EKG ?Normal sinus rhythm  with ventricular rate of 88 bpm ?No ischemic changes ? ?Radiology ?No results found. ? ?Procedures ?Procedures (including critical care time) ? ?Medications Ordered in UC ?Medications - No data to display ? ?Initial Impression / Assessment and Plan / UC Course  ?I have reviewed the triage vital signs and the nursing notes. ? ?Pertinent labs & imaging results that were available during my care of the patient were reviewed by me and considered in my medical decision making (see chart for details). ? ?Clinical Course as of 08/13/21 2056  ?Mon Aug 13, 2021  ?2032 BP recheck 100/66 [RR]  ?  ?Clinical Course User Index ?[RR] Rocklin Soderquist, Lurena Joiner, PA-C  ? ? ?At this time neurological exam is very reassuring.  Patient seems to have positional vertigo. We will try meclizine as needed for vertigo symptoms.  Patient is instructed to take it easy and not move positions too quickly. CBC and CMP pending at this time. ?EKG in clinic today reassuring, looks to be normal sinus rhythm. Unknown cause of heart palpitation.  While in clinic today patient has no chest pain, palpitations. Recommend to follow-up with primary care in regards to these symptoms. ?At this time we have refilled patient's diabetes medicine. He will continue his sliding scale Humalog as needed with breakfast lunch and dinner. Sent in refill of nighttime Lantus.  He was originally doing 50 units at nighttime.  I recommend he check his sugars at night and in the morning and use his sliding scale to determine how many units are necessary at bedtime. Have also refilled his daily glipizide.  Caution patient to monitor his blood sugar so that he does not drop too low.  When he establishes care with a primary  care in the area he will be following up with them regarding these medications. ?Patient agrees to plan, return precautions were discussed, and patient is discharged in stable condition. ? ?Final Clinical Impr

## 2021-08-13 NOTE — Discharge Instructions (Addendum)
Please check your blood sugars before each meal and at bedtime. You can adjust your insulin doses based on your sliding scale. Please follow up with a primary care provider as soon as possible in regards to your diabetes and dizziness.  ? ?You can try the meclizine as needed for vertigo symptoms.  ? ?Please return to the urgent care or emergency department if symptoms worsen or do not improve. ?

## 2021-08-14 ENCOUNTER — Other Ambulatory Visit: Payer: Self-pay

## 2021-08-15 ENCOUNTER — Other Ambulatory Visit: Payer: Self-pay

## 2021-08-15 LAB — COMPREHENSIVE METABOLIC PANEL
ALT: 20 IU/L (ref 0–44)
AST: 19 IU/L (ref 0–40)
Albumin/Globulin Ratio: 1.3 (ref 1.2–2.2)
Albumin: 4.4 g/dL (ref 4.0–5.0)
Alkaline Phosphatase: 107 IU/L (ref 44–121)
BUN/Creatinine Ratio: 22 — ABNORMAL HIGH (ref 9–20)
BUN: 36 mg/dL — ABNORMAL HIGH (ref 6–24)
Bilirubin Total: <0.2 mg/dL (ref 0.0–1.2)
CO2: 22 mmol/L (ref 20–29)
Calcium: 9.9 mg/dL (ref 8.7–10.2)
Chloride: 96 mmol/L (ref 96–106)
Creatinine, Ser: 1.67 mg/dL — ABNORMAL HIGH (ref 0.76–1.27)
Globulin, Total: 3.3 g/dL (ref 1.5–4.5)
Glucose: 202 mg/dL — ABNORMAL HIGH (ref 70–99)
Potassium: 4.8 mmol/L (ref 3.5–5.2)
Sodium: 134 mmol/L (ref 134–144)
Total Protein: 7.7 g/dL (ref 6.0–8.5)
eGFR: 50 mL/min/{1.73_m2} — ABNORMAL LOW (ref >59)

## 2021-08-15 LAB — CBC WITH DIFFERENTIAL/PLATELET
Basophils Absolute: 0 10*3/uL (ref 0.0–0.2)
Basos: 1 %
EOS (ABSOLUTE): 0.1 10*3/uL (ref 0.0–0.4)
Eos: 2 %
Hematocrit: 41.3 % (ref 37.5–51.0)
Hemoglobin: 14 g/dL (ref 13.0–17.7)
Immature Grans (Abs): 0 10*3/uL (ref 0.0–0.1)
Immature Granulocytes: 1 %
Lymphocytes Absolute: 2.5 10*3/uL (ref 0.7–3.1)
Lymphs: 32 %
MCH: 29 pg (ref 26.6–33.0)
MCHC: 33.9 g/dL (ref 31.5–35.7)
MCV: 86 fL (ref 79–97)
Monocytes Absolute: 0.4 10*3/uL (ref 0.1–0.9)
Monocytes: 5 %
Neutrophils Absolute: 4.7 10*3/uL (ref 1.4–7.0)
Neutrophils: 59 %
Platelets: 256 10*3/uL (ref 150–450)
RBC: 4.83 x10E6/uL (ref 4.14–5.80)
RDW: 12.8 % (ref 11.6–15.4)
WBC: 7.8 10*3/uL (ref 3.4–10.8)

## 2021-08-21 ENCOUNTER — Other Ambulatory Visit: Payer: Self-pay

## 2021-08-28 ENCOUNTER — Other Ambulatory Visit: Payer: Self-pay

## 2021-08-29 ENCOUNTER — Other Ambulatory Visit: Payer: Self-pay

## 2021-11-06 ENCOUNTER — Ambulatory Visit (INDEPENDENT_AMBULATORY_CARE_PROVIDER_SITE_OTHER): Payer: 59 | Admitting: Internal Medicine

## 2021-11-06 ENCOUNTER — Encounter: Payer: Self-pay | Admitting: Internal Medicine

## 2021-11-06 VITALS — BP 82/60 | HR 95 | Temp 98.3°F | Ht 74.0 in | Wt 262.2 lb

## 2021-11-06 DIAGNOSIS — B309 Viral conjunctivitis, unspecified: Secondary | ICD-10-CM

## 2021-11-06 DIAGNOSIS — E1142 Type 2 diabetes mellitus with diabetic polyneuropathy: Secondary | ICD-10-CM | POA: Diagnosis not present

## 2021-11-06 DIAGNOSIS — E139 Other specified diabetes mellitus without complications: Secondary | ICD-10-CM | POA: Diagnosis not present

## 2021-11-06 DIAGNOSIS — E785 Hyperlipidemia, unspecified: Secondary | ICD-10-CM

## 2021-11-06 DIAGNOSIS — I959 Hypotension, unspecified: Secondary | ICD-10-CM | POA: Diagnosis not present

## 2021-11-06 DIAGNOSIS — R9431 Abnormal electrocardiogram [ECG] [EKG]: Secondary | ICD-10-CM

## 2021-11-06 DIAGNOSIS — E1165 Type 2 diabetes mellitus with hyperglycemia: Secondary | ICD-10-CM

## 2021-11-06 DIAGNOSIS — E781 Pure hyperglyceridemia: Secondary | ICD-10-CM

## 2021-11-06 LAB — CBC WITH DIFFERENTIAL/PLATELET
Basophils Absolute: 0 10*3/uL (ref 0.0–0.1)
Basophils Relative: 0.5 % (ref 0.0–3.0)
Eosinophils Absolute: 0.1 10*3/uL (ref 0.0–0.7)
Eosinophils Relative: 1.3 % (ref 0.0–5.0)
HCT: 38.6 % — ABNORMAL LOW (ref 39.0–52.0)
Hemoglobin: 13.5 g/dL (ref 13.0–17.0)
Lymphocytes Relative: 18.5 % (ref 12.0–46.0)
Lymphs Abs: 1.5 10*3/uL (ref 0.7–4.0)
MCHC: 35.1 g/dL (ref 30.0–36.0)
MCV: 83.1 fl (ref 78.0–100.0)
Monocytes Absolute: 0.3 10*3/uL (ref 0.1–1.0)
Monocytes Relative: 3.9 % (ref 3.0–12.0)
Neutro Abs: 6.1 10*3/uL (ref 1.4–7.7)
Neutrophils Relative %: 75.8 % (ref 43.0–77.0)
Platelets: 270 10*3/uL (ref 150.0–400.0)
RBC: 4.65 Mil/uL (ref 4.22–5.81)
RDW: 13 % (ref 11.5–15.5)
WBC: 8.1 10*3/uL (ref 4.0–10.5)

## 2021-11-06 LAB — LIPID PANEL
Cholesterol: 279 mg/dL — ABNORMAL HIGH (ref 0–200)
HDL: 33 mg/dL — ABNORMAL LOW (ref >39.00)
Total CHOL/HDL Ratio: 8
Triglycerides: 893 mg/dL — ABNORMAL HIGH (ref 0.0–149.0)

## 2021-11-06 LAB — HEMOGLOBIN A1C: Hgb A1c MFr Bld: 10.3 % — ABNORMAL HIGH (ref 4.6–6.5)

## 2021-11-06 LAB — HEPATIC FUNCTION PANEL
ALT: 19 U/L (ref 0–53)
AST: 17 U/L (ref 0–37)
Albumin: 4.1 g/dL (ref 3.5–5.2)
Alkaline Phosphatase: 83 U/L (ref 39–117)
Bilirubin, Direct: 0 mg/dL (ref 0.0–0.3)
Total Bilirubin: 0.4 mg/dL (ref 0.2–1.2)
Total Protein: 7.8 g/dL (ref 6.0–8.3)

## 2021-11-06 LAB — POCT GLYCOSYLATED HEMOGLOBIN (HGB A1C): Hemoglobin A1C: 10 % — AB (ref 4.0–5.6)

## 2021-11-06 LAB — BASIC METABOLIC PANEL
BUN: 23 mg/dL (ref 6–23)
CO2: 28 mEq/L (ref 19–32)
Calcium: 9.5 mg/dL (ref 8.4–10.5)
Chloride: 97 mEq/L (ref 96–112)
Creatinine, Ser: 1.29 mg/dL (ref 0.40–1.50)
GFR: 65.22 mL/min (ref >60.00)
Glucose, Bld: 298 mg/dL — ABNORMAL HIGH (ref 70–99)
Potassium: 4.2 mEq/L (ref 3.5–5.1)
Sodium: 134 mEq/L — ABNORMAL LOW (ref 135–145)

## 2021-11-06 LAB — MICROALBUMIN / CREATININE URINE RATIO
Creatinine,U: 163.5 mg/dL
Microalb Creat Ratio: 1.8 mg/g (ref 0.0–30.0)
Microalb, Ur: 3 mg/dL — ABNORMAL HIGH (ref 0.0–1.9)

## 2021-11-06 LAB — LDL CHOLESTEROL, DIRECT: Direct LDL: 144 mg/dL

## 2021-11-06 LAB — TSH: TSH: 1.81 u[IU]/mL (ref 0.35–5.50)

## 2021-11-06 LAB — CORTISOL: Cortisol, Plasma: 6.1 ug/dL

## 2021-11-06 LAB — TROPONIN I (HIGH SENSITIVITY): High Sens Troponin I: 10 ng/L (ref 2–17)

## 2021-11-06 MED ORDER — NEOMYCIN-POLYMYXIN-DEXAMETH 3.5-10000-0.1 OP SUSP
2.0000 [drp] | Freq: Four times a day (QID) | OPHTHALMIC | 0 refills | Status: AC
  Filled 2021-11-06: qty 5, 13d supply, fill #0

## 2021-11-06 MED ORDER — FREESTYLE LIBRE 2 SENSOR MISC
1.0000 | Freq: Every day | 5 refills | Status: DC
  Filled 2021-11-06: qty 2, 28d supply, fill #0

## 2021-11-06 MED ORDER — FREESTYLE LIBRE 2 READER DEVI
1.0000 | Freq: Every day | 5 refills | Status: DC
  Filled 2021-11-06: qty 1, 90d supply, fill #0
  Filled 2021-11-07: qty 1, 30d supply, fill #0

## 2021-11-06 NOTE — Progress Notes (Unsigned)
Subjective:  Patient ID: Micheal Neal, male    DOB: 07/09/1970  Age: 51 y.o. MRN: [id number]  CC: Hyperlipidemia   HPI Scout Gumbs presents for establishing.  History [name] has a past medical history of Contusion of left foot and Diabetes mellitus without complication (HCC).   He has no past surgical history on file.   His family history includes Breast cancer in his maternal aunt and maternal grandmother; Heart disease in his mother.He reports that he has never smoked. He has never used smokeless tobacco. He reports that he does not drink alcohol and does not use drugs.  Outpatient Medications Prior to Visit  Medication Sig Dispense Refill   acetaminophen (TYLENOL) 500 MG tablet Take 1 tablet (500 mg total) by mouth every 6 (six) hours as needed. 30 tablet 0   glipiZIDE (GLUCOTROL XL) 10 MG 24 hr tablet Take 1 tablet (10 mg total) by mouth daily with breakfast. 30 tablet 2   insulin lispro (HUMALOG KWIKPEN) 100 UNIT/ML KwikPen Inject 100 Units into the skin with breakfast, with lunch, and with evening meal. 18 mL 11   Insulin Pen Needle (TRUEPLUS PEN NEEDLES) 31G X 5 MM MISC USE AS DIRECTED FOUR TIMES DAILY 100 each 11   Insulin Syringe-Needle U-100 (BD INSULIN SYRINGE ULTRAFINE) 31G X 5/16" 0.5 ML MISC 1 each by Does not apply route 4 (four) times daily -  before meals and at bedtime. 100 each 11   insulin glargine (LANTUS) 100 UNIT/ML injection Inject 0.1 mLs (10 Units total) into the skin at bedtime. 10 mL 1   No facility-administered medications prior to visit.    ROS Review of Systems  Constitutional: Negative.   HENT: Negative.    Eyes:  Positive for photophobia, pain, discharge, redness and visual disturbance. Negative for itching.  Respiratory:  Negative for chest tightness, shortness of breath and wheezing.   Cardiovascular:  Negative for chest pain, palpitations and leg swelling.  Gastrointestinal:  Negative for abdominal pain, constipation, diarrhea, nausea and  vomiting.  Endocrine: Positive for polydipsia, polyphagia and polyuria.  Genitourinary: Negative.  Negative for difficulty urinating and dysuria.  Musculoskeletal: Negative.  Negative for arthralgias and myalgias.  Skin: Negative.   Neurological:  Positive for dizziness, light-headedness, numbness and headaches. Negative for weakness.  Hematological:  Negative for adenopathy. Does not bruise/bleed easily.  Psychiatric/Behavioral: Negative.      Objective:  BP (!) 82/60   Pulse 95   Temp 98.3 F (36.8 C) (Oral)   Ht 6\' 2"  (1.88 m)   Wt 262 lb 4 oz (119 kg)   SpO2 97%   BMI 33.67 kg/m   Physical Exam Cardiovascular:     Rate and Rhythm: Normal rate and regular rhythm.     Heart sounds: Normal heart sounds, S1 normal and S2 normal. No murmur heard.    Comments: EKG- NSR, 86 bpm TWI in V5V6 is new Flat T wave in III No Q waves Pulmonary:     Effort: Pulmonary effort is normal.     Breath sounds: No stridor. No wheezing, rhonchi or rales.  Musculoskeletal:     Cervical back: Neck supple.     Right lower leg: No edema.     Left lower leg: No edema.  Lymphadenopathy:     Cervical: No cervical adenopathy.     Lab Results  Component Value Date   WBC 8.1 11/06/2021   HGB 13.5 11/06/2021   HCT 38.6 (L) 11/06/2021   PLT 270.0 11/06/2021   GLUCOSE  298 (H) 11/06/2021   CHOL 279 (H) 11/06/2021   TRIG (H) 11/06/2021    893.0 Triglyceride is over 400; calculations on Lipids are invalid.   HDL 33.00 (L) 11/06/2021   LDLDIRECT 144.0 11/06/2021   LDLCALC 72 05/06/2018   ALT 19 11/06/2021   AST 17 11/06/2021   NA 134 (L) 11/06/2021   K 4.2 11/06/2021   CL 97 11/06/2021   CREATININE 1.29 11/06/2021   BUN 23 11/06/2021   CO2 28 11/06/2021   TSH 1.81 11/06/2021   HGBA1C 10.3 (H) 11/06/2021   MICROALBUR 3.0 (H) 11/06/2021      Assessment & Plan:   Zayveon was seen today for hyperlipidemia.  Diagnoses and all orders for this visit:  Diabetes 1.5, managed as type 1  (HCC) -     Basic metabolic panel; Future -     Microalbumin / creatinine urine ratio; Future -     Hemoglobin A1c; Future -     POCT HgB A1C -     Hemoglobin A1c -     Microalbumin / creatinine urine ratio -     Basic metabolic panel -     HM Diabetes Foot Exam -     Discontinue: Continuous Blood Gluc Receiver (FREESTYLE LIBRE 2 READER) DEVI; 1 Act by Does not apply route daily. -     Discontinue: Continuous Blood Gluc Sensor (FREESTYLE LIBRE 2 SENSOR) MISC; 1 Act by Does not apply route daily. -     Ambulatory referral to Ophthalmology -     Ambulatory referral to Endocrinology -     Amb Referral to Nutrition and Diabetic Education -     insulin glargine (LANTUS) 100 UNIT/ML injection; Inject 0.3 mLs (30 Units total) into the skin at bedtime. -     Discontinue: Continuous Blood Gluc Sensor (DEXCOM G6 SENSOR) MISC; 1 Act by Does not apply route daily. -     Discontinue: Continuous Blood Gluc Transmit (DEXCOM G6 TRANSMITTER) MISC; Use as directed daily -     Continuous Blood Gluc Sensor (DEXCOM G6 SENSOR) MISC; Use as directed daily  Uncontrolled type 2 diabetes mellitus with hyperglycemia (HCC) -     Basic metabolic panel; Future -     Microalbumin / creatinine urine ratio; Future -     CBC with Differential/Platelet; Future -     Hemoglobin A1c; Future -     Hemoglobin A1c -     CBC with Differential/Platelet -     Microalbumin / creatinine urine ratio -     Basic metabolic panel -     HM Diabetes Foot Exam -     Discontinue: Continuous Blood Gluc Receiver (FREESTYLE LIBRE 2 READER) DEVI; 1 Act by Does not apply route daily. -     Discontinue: Continuous Blood Gluc Sensor (FREESTYLE LIBRE 2 SENSOR) MISC; 1 Act by Does not apply route daily. -     Ambulatory referral to Endocrinology -     Amb Referral to Nutrition and Diabetic Education -     insulin glargine (LANTUS) 100 UNIT/ML injection; Inject 0.3 mLs (30 Units total) into the skin at bedtime. -     Discontinue: Continuous  Blood Gluc Sensor (DEXCOM G6 SENSOR) MISC; 1 Act by Does not apply route daily. -     Discontinue: Continuous Blood Gluc Transmit (DEXCOM G6 TRANSMITTER) MISC; Use as directed daily -     Continuous Blood Gluc Sensor (DEXCOM G6 SENSOR) MISC; Use as directed daily  Diabetic polyneuropathy associated with type  2 diabetes mellitus (HCC) -     Basic metabolic panel; Future -     Basic metabolic panel -     HM Diabetes Foot Exam  Hyperlipidemia with target LDL less than 100 -     Lipid panel; Future -     TSH; Future -     Hepatic function panel; Future -     Hepatic function panel -     TSH -     Lipid panel -     rosuvastatin (CRESTOR) 20 MG tablet; Take 1 tablet (20 mg total) by mouth daily.  Hypotension, unspecified hypotension type -     Basic metabolic panel; Future -     CBC with Differential/Platelet; Future -     Urinalysis, Routine w reflex microscopic; Future -     Cortisol; Future -     Cortisol -     Urinalysis, Routine w reflex microscopic -     CBC with Differential/Platelet -     Basic metabolic panel  Acute viral conjunctivitis of right eye -     neomycin-polymyxin b-dexamethasone (MAXITROL) 3.5-10000-0.1 SUSP; Place 2 drops into both eyes every 6 (six) hours for 7 days. -     Ambulatory referral to Ophthalmology  Abnormal electrocardiogram (ECG) (EKG) -     Troponin I (High Sensitivity); Future -     Troponin I (High Sensitivity)  Hypertriglyceridemia -     insulin glargine (LANTUS) 100 UNIT/ML injection; Inject 0.3 mLs (30 Units total) into the skin at bedtime. -     omega-3 acid ethyl esters (LOVAZA) 1 g capsule; Take 2 capsules (2 g total) by mouth 2 (two) times daily.  Other orders -     LDL cholesterol, direct   I have discontinued [name] "[address] [place]s FreeStyle [place] 2 Reader and [employer] 2 Sensor. I have also changed his insulin glargine and Dexcom G6 Sensor. Additionally, I am having him start on neomycin-polymyxin b-dexamethasone,  omega-3 acid ethyl esters, and rosuvastatin. Lastly, I am having him maintain his acetaminophen, glipiZIDE, insulin lispro, TRUEplus Pen Needles, and Insulin Syringe-Needle U-100.  Meds ordered this encounter  Medications   neomycin-polymyxin b-dexamethasone (MAXITROL) 3.5-10000-0.1 SUSP    Sig: Place 2 drops into both eyes every 6 (six) hours for 7 days.    Dispense:  5 mL    Refill:  0   DISCONTD: Continuous Blood Gluc Receiver (FREESTYLE LIBRE 2 READER) DEVI    Sig: 1 Act by Does not apply route daily.    Dispense:  2 each    Refill:  5   DISCONTD: Continuous Blood Gluc Sensor (FREESTYLE LIBRE 2 SENSOR) MISC    Sig: 1 Act by Does not apply route daily.    Dispense:  2 each    Refill:  5   insulin glargine (LANTUS) 100 UNIT/ML injection    Sig: Inject 0.3 mLs (30 Units total) into the skin at bedtime.    Dispense:  27 mL    Refill:  1   omega-3 acid ethyl esters (LOVAZA) 1 g capsule    Sig: Take 2 capsules (2 g total) by mouth 2 (two) times daily.    Dispense:  360 capsule    Refill:  1   DISCONTD: Continuous Blood Gluc Sensor (DEXCOM G6 SENSOR) MISC    Sig: 1 Act by Does not apply route daily.    Dispense:  1 each    Refill:  5   DISCONTD: Continuous Blood Gluc Transmit (DEXCOM G6 TRANSMITTER)  MISC    Sig: Use as directed daily    Dispense:  1 each    Refill:  5   Continuous Blood Gluc Sensor (DEXCOM G6 SENSOR) MISC    Sig: Use as directed daily    Dispense:  1 each    Refill:  5   rosuvastatin (CRESTOR) 20 MG tablet    Sig: Take 1 tablet (20 mg total) by mouth daily.    Dispense:  90 tablet    Refill:  1     Follow-up: No follow-ups on file.  Sanda Linger, MD

## 2021-11-07 ENCOUNTER — Other Ambulatory Visit: Payer: Self-pay

## 2021-11-07 ENCOUNTER — Telehealth: Payer: Self-pay | Admitting: Family Medicine

## 2021-11-07 ENCOUNTER — Encounter: Payer: Self-pay | Admitting: Internal Medicine

## 2021-11-07 DIAGNOSIS — E1165 Type 2 diabetes mellitus with hyperglycemia: Secondary | ICD-10-CM

## 2021-11-07 DIAGNOSIS — E781 Pure hyperglyceridemia: Secondary | ICD-10-CM | POA: Insufficient documentation

## 2021-11-07 DIAGNOSIS — E139 Other specified diabetes mellitus without complications: Secondary | ICD-10-CM

## 2021-11-07 LAB — URINALYSIS, ROUTINE W REFLEX MICROSCOPIC
Bilirubin Urine: NEGATIVE
Hgb urine dipstick: NEGATIVE
Ketones, ur: NEGATIVE
Leukocytes,Ua: NEGATIVE
Nitrite: NEGATIVE
RBC / HPF: NONE SEEN (ref 0–?)
Specific Gravity, Urine: 1.025 (ref 1.000–1.030)
Total Protein, Urine: NEGATIVE
Urine Glucose: >=1000 — AB
Urobilinogen, UA: 0.2 (ref 0.0–1.0)
pH: 5.5 (ref 5.0–8.0)

## 2021-11-07 MED ORDER — INSULIN GLARGINE 100 UNIT/ML ~~LOC~~ SOLN
30.0000 [IU] | Freq: Every day | SUBCUTANEOUS | 1 refills | Status: DC
  Filled 2021-11-07: qty 27, 90d supply, fill #0
  Filled 2021-11-14: qty 10, 33d supply, fill #0

## 2021-11-07 MED ORDER — ROSUVASTATIN CALCIUM 20 MG PO TABS
20.0000 mg | ORAL_TABLET | Freq: Every day | ORAL | 1 refills | Status: DC
  Filled 2021-11-07 – 2021-11-15 (×2): qty 30, 30d supply, fill #0
  Filled 2021-12-16: qty 30, 30d supply, fill #1
  Filled 2022-01-22: qty 30, 30d supply, fill #2
  Filled 2022-04-30: qty 30, 30d supply, fill #3

## 2021-11-07 MED ORDER — DEXCOM G6 RECEIVER DEVI
0 refills | Status: DC
  Filled 2021-11-07: qty 1, 30d supply, fill #0

## 2021-11-07 MED ORDER — OMEGA-3-ACID ETHYL ESTERS 1 G PO CAPS
2.0000 g | ORAL_CAPSULE | Freq: Two times a day (BID) | ORAL | 1 refills | Status: DC
  Filled 2021-11-07: qty 360, 90d supply, fill #0
  Filled 2021-11-14: qty 120, 30d supply, fill #0
  Filled 2022-06-20: qty 120, 30d supply, fill #1
  Filled 2022-07-18: qty 120, 30d supply, fill #2
  Filled 2022-08-20: qty 120, 30d supply, fill #3
  Filled 2022-09-14: qty 120, 30d supply, fill #4

## 2021-11-07 MED ORDER — DEXCOM G6 TRANSMITTER MISC
0 refills | Status: DC
  Filled 2021-11-07: qty 1, fill #0
  Filled 2021-11-07 (×2): qty 1, 30d supply, fill #0

## 2021-11-07 MED ORDER — DEXCOM G6 SENSOR MISC
1.0000 | Freq: Every day | 5 refills | Status: DC
  Filled 2021-11-07: qty 1, 30d supply, fill #0
  Filled 2021-11-30 – 2021-12-04 (×2): qty 1, 30d supply, fill #1

## 2021-11-07 MED ORDER — DEXCOM G6 SENSOR MISC
1.0000 | Freq: Every day | 5 refills | Status: DC
  Filled 2021-11-07: qty 1, fill #0

## 2021-11-07 MED ORDER — DEXCOM G6 TRANSMITTER MISC
1.0000 | Freq: Every day | 5 refills | Status: DC
  Filled 2021-11-07: qty 1, 30d supply, fill #0

## 2021-11-07 MED ORDER — DEXCOM G6 TRANSMITTER MISC
3 refills | Status: DC
  Filled 2021-11-22: qty 1, 30d supply, fill #0
  Filled 2021-11-27: qty 1, fill #0
  Filled 2021-11-27 – 2022-02-05 (×3): qty 1, 30d supply, fill #0

## 2021-11-07 NOTE — Telephone Encounter (Signed)
Patient's insurance does not cover libre - but it does cover the dexcom G6 - Also Glipizide needs to be refilled - please send to [facility].

## 2021-11-07 NOTE — Telephone Encounter (Signed)
A representative with [facility] at [place] calls today in regards to this matter. They have received the RX for the [facility] G6 transmitter, and sensor. But they are still needing the RX for the G6 Receiver.  Also they have not received the RX for the glipiZIDE (GLUCOTROL XL) 10 MG.  CB if needed: [phone]

## 2021-11-08 ENCOUNTER — Other Ambulatory Visit: Payer: Self-pay | Admitting: Internal Medicine

## 2021-11-08 ENCOUNTER — Other Ambulatory Visit: Payer: Self-pay

## 2021-11-08 MED ORDER — GLIPIZIDE ER 10 MG PO TB24
10.0000 mg | ORAL_TABLET | Freq: Every day | ORAL | 0 refills | Status: DC
  Filled 2021-11-08 – 2021-11-22 (×3): qty 30, 30d supply, fill #0
  Filled 2022-01-07: qty 30, 30d supply, fill #1

## 2021-11-08 MED ORDER — DEXCOM G6 RECEIVER DEVI
5 refills | Status: DC
  Filled 2021-11-08: qty 1, fill #0

## 2021-11-13 NOTE — Addendum Note (Signed)
Addended by: Darryll Capers on: 11/13/2021 01:23 PM   Modules accepted: Orders

## 2021-11-15 ENCOUNTER — Other Ambulatory Visit: Payer: Self-pay

## 2021-11-15 ENCOUNTER — Other Ambulatory Visit: Payer: Self-pay | Admitting: Internal Medicine

## 2021-11-15 ENCOUNTER — Telehealth: Payer: Self-pay | Admitting: Internal Medicine

## 2021-11-15 DIAGNOSIS — E139 Other specified diabetes mellitus without complications: Secondary | ICD-10-CM

## 2021-11-15 DIAGNOSIS — E1165 Type 2 diabetes mellitus with hyperglycemia: Secondary | ICD-10-CM

## 2021-11-15 MED ORDER — INSULIN LISPRO (1 UNIT DIAL) 100 UNIT/ML (KWIKPEN)
10.0000 [IU] | PEN_INJECTOR | Freq: Three times a day (TID) | SUBCUTANEOUS | 1 refills | Status: DC
  Filled 2021-11-15: qty 9, 30d supply, fill #0
  Filled 2021-11-22: qty 18, 60d supply, fill #0
  Filled 2021-11-29: qty 9, 30d supply, fill #0

## 2021-11-15 MED ORDER — BASAGLAR KWIKPEN 100 UNIT/ML ~~LOC~~ SOPN
PEN_INJECTOR | SUBCUTANEOUS | 1 refills | Status: DC
  Filled 2021-11-15: qty 9, 30d supply, fill #0
  Filled 2021-12-16: qty 9, 30d supply, fill #1
  Filled 2022-02-05: qty 9, 30d supply, fill #2
  Filled 2022-03-19: qty 9, 30d supply, fill #3
  Filled 2022-04-17: qty 9, 30d supply, fill #4

## 2021-11-15 NOTE — Telephone Encounter (Signed)
Micheal Neal with Pharmacy called needing new RX for patient's - insulin lispro (HUMALOG KWIKPEN) 100 UNIT/ML KwikPen states that patient has transferred to their pharmacy and the RX he has from the hospital has instructions that do not make sense. Requesting new RX and directions.  [facility]  Call back number is [phone]

## 2021-11-16 ENCOUNTER — Other Ambulatory Visit: Payer: Self-pay

## 2021-11-18 ENCOUNTER — Emergency Department (HOSPITAL_COMMUNITY)
Admission: EM | Admit: 2021-11-18 | Discharge: 2021-11-19 | Disposition: A | Discharge status: Home / Self Care | Payer: 59 | Attending: Emergency Medicine | Admitting: Emergency Medicine

## 2021-11-18 ENCOUNTER — Encounter (HOSPITAL_COMMUNITY): Payer: Self-pay

## 2021-11-18 ENCOUNTER — Emergency Department (HOSPITAL_COMMUNITY): Payer: 59

## 2021-11-18 DIAGNOSIS — R55 Syncope and collapse: Secondary | ICD-10-CM

## 2021-11-18 DIAGNOSIS — Z7982 Long term (current) use of aspirin: Secondary | ICD-10-CM | POA: Diagnosis not present

## 2021-11-18 DIAGNOSIS — E1165 Type 2 diabetes mellitus with hyperglycemia: Secondary | ICD-10-CM | POA: Insufficient documentation

## 2021-11-18 DIAGNOSIS — Z7984 Long term (current) use of oral hypoglycemic drugs: Secondary | ICD-10-CM | POA: Insufficient documentation

## 2021-11-18 DIAGNOSIS — Z794 Long term (current) use of insulin: Secondary | ICD-10-CM | POA: Diagnosis not present

## 2021-11-18 DIAGNOSIS — I951 Orthostatic hypotension: Secondary | ICD-10-CM | POA: Insufficient documentation

## 2021-11-18 HISTORY — DX: Dizziness and giddiness: R42

## 2021-11-18 LAB — CBC WITH DIFFERENTIAL/PLATELET
Abs Immature Granulocytes: 0.03 10*3/uL (ref 0.00–0.07)
Basophils Absolute: 0 10*3/uL (ref 0.0–0.1)
Basophils Relative: 0 %
Eosinophils Absolute: 0.1 10*3/uL (ref 0.0–0.5)
Eosinophils Relative: 2 %
HCT: 40.1 % (ref 39.0–52.0)
Hemoglobin: 13.4 g/dL (ref 13.0–17.0)
Immature Granulocytes: 0 %
Lymphocytes Relative: 25 %
Lymphs Abs: 2.2 10*3/uL (ref 0.7–4.0)
MCH: 28.8 pg (ref 26.0–34.0)
MCHC: 33.4 g/dL (ref 30.0–36.0)
MCV: 86.1 fL (ref 80.0–100.0)
Monocytes Absolute: 0.4 10*3/uL (ref 0.1–1.0)
Monocytes Relative: 5 %
Neutro Abs: 6.1 10*3/uL (ref 1.7–7.7)
Neutrophils Relative %: 68 %
Platelets: 281 10*3/uL (ref 150–400)
RBC: 4.66 MIL/uL (ref 4.22–5.81)
RDW: 12.7 % (ref 11.5–15.5)
WBC: 8.9 10*3/uL (ref 4.0–10.5)
nRBC: 0 % (ref 0.0–0.2)

## 2021-11-18 LAB — CK: Total CK: 143 U/L (ref 49–397)

## 2021-11-18 LAB — COMPREHENSIVE METABOLIC PANEL
ALT: 33 U/L (ref 0–44)
AST: 29 U/L (ref 15–41)
Albumin: 3.8 g/dL (ref 3.5–5.0)
Alkaline Phosphatase: 87 U/L (ref 38–126)
Anion gap: 9 (ref 5–15)
BUN: 22 mg/dL — ABNORMAL HIGH (ref 6–20)
CO2: 27 mmol/L (ref 22–32)
Calcium: 8.7 mg/dL — ABNORMAL LOW (ref 8.9–10.3)
Chloride: 101 mmol/L (ref 98–111)
Creatinine, Ser: 1.54 mg/dL — ABNORMAL HIGH (ref 0.61–1.24)
GFR, Estimated: 55 mL/min — ABNORMAL LOW (ref >60)
Glucose, Bld: 293 mg/dL — ABNORMAL HIGH (ref 70–99)
Potassium: 4.8 mmol/L (ref 3.5–5.1)
Sodium: 137 mmol/L (ref 135–145)
Total Bilirubin: 1.2 mg/dL (ref 0.3–1.2)
Total Protein: 7.8 g/dL (ref 6.5–8.1)

## 2021-11-18 LAB — TROPONIN I (HIGH SENSITIVITY): Troponin I (High Sensitivity): 5 ng/L (ref <18)

## 2021-11-18 LAB — CBG MONITORING, ED: Glucose-Capillary: 317 mg/dL — ABNORMAL HIGH (ref 70–99)

## 2021-11-18 MED ORDER — LACTATED RINGERS IV BOLUS
1000.0000 mL | Freq: Once | INTRAVENOUS | Status: AC
  Administered 2021-11-18: 1000 mL via INTRAVENOUS

## 2021-11-18 MED ORDER — LACTATED RINGERS IV SOLN: INTRAVENOUS | Status: DC

## 2021-11-18 NOTE — ED Provider Notes (Signed)
[facility] Provider Note   CSN: [id number] Arrival date & time: 11/18/21  2052     History {Add pertinent medical, surgical, social history, OB history to HPI:1} Chief Complaint  Patient presents with   Hypotension   Near Syncope    Micheal Neal is a 51 y.o. male.  The history is provided by the patient and medical records.  Near Syncope   51 year old male with history of diabetes, hyperlipidemia, vertigo, hypotension, presenting to the ED with complaints of dizziness.  He reports episodes of dizziness and lightheadedness over the past year, worse over the past few weeks.  Was seen recently by PCP for same and started on Crestor for hyperlipidemia.  He is only been taking this for about 4 days thus far.  States today he [place] go [place] the food [place], started getting dizzy when walking so came home.  Even while at home he continued to feel poorly, experiencing lightheadedness, near syncope, and palpitations when standing up.  He did not have full syncopal episode or lose consciousness.  He sat down on the couch and wife checked blood pressure and was noted to be low in the 70s systolic.  He has a history of same.  Currently, he is asymptomatic while sitting still.  He denies any fever or other recent infectious symptoms.  Home Medications Prior to Admission medications   Medication Sig Start Date End Date Taking? Authorizing Provider  aspirin EC 81 MG tablet Take 81 mg by mouth daily. Swallow whole.   Yes [provider]  glipiZIDE (GLUCOTROL XL) 10 MG 24 hr tablet Take 1 tablet (10 mg total) by mouth daily with breakfast. 11/08/21 02/06/22 Yes Etta Grandchild, MD  Insulin Glargine [facility]) 100 UNIT/ML INJECT 30 UNITS INTO THE SKIN AT BEDTIME 11/07/21  Yes Etta Grandchild, MD  insulin lispro (HUMALOG KWIKPEN) 100 UNIT/ML KwikPen Inject 10 Units into the skin with breakfast, with lunch, and with evening meal. Patient taking  differently: Inject 10-15 Units into the skin 3 (three) times daily as needed (high blood sugar). 11/15/21  Yes Etta Grandchild, MD  omega-3 acid ethyl esters (LOVAZA) 1 g capsule Take 2 capsules (2 g total) by mouth 2 (two) times daily. 11/07/21  Yes Etta Grandchild, MD  rosuvastatin (CRESTOR) 20 MG tablet Take 1 tablet (20 mg total) by mouth daily. 11/07/21  Yes Etta Grandchild, MD  acetaminophen (TYLENOL) 500 MG tablet Take 1 tablet (500 mg total) by mouth every 6 (six) hours as needed. Patient not taking: Reported on 11/18/2021 11/08/19   Durward Parcel, FNP  Continuous Blood Gluc Receiver (DEXCOM G6 RECEIVER) DEVI Use to check blood sugars 3x daily. DX: E11.40 11/08/21   Etta Grandchild, MD  Continuous Blood Gluc Sensor (DEXCOM G6 SENSOR) MISC Use as directed daily 11/07/21   Etta Grandchild, MD  Continuous Blood Gluc Transmit (DEXCOM G6 TRANSMITTER) MISC Use to check blood sugars 3 times a day as directed 11/07/21   Etta Grandchild, MD  Insulin Pen Needle (TRUEPLUS PEN NEEDLES) 31G X 5 MM MISC USE AS DIRECTED FOUR TIMES DAILY 08/13/21   Rising, Lurena Joiner, PA-C  Insulin Syringe-Needle U-100 (BD INSULIN SYRINGE ULTRAFINE) 31G X 5/16" 0.5 ML MISC 1 each by Does not apply route 4 (four) times daily -  before meals and at bedtime. 08/13/21   Rising, Lurena Joiner, PA-C      Allergies    Patient has no known allergies.  Review of Systems   Review of Systems  Cardiovascular:  Positive for near-syncope.  Neurological:  Positive for light-headedness.  All other systems reviewed and are negative.   Physical Exam Updated Vital Signs BP 107/69   Pulse 89   Temp 98.4 F (36.9 C) (Oral)   Resp 17   Ht 6\' 2"  (1.88 m)   Wt 120.2 kg   SpO2 96%   BMI 34.02 kg/m   Physical Exam Vitals and nursing note reviewed.  Constitutional:      Appearance: He is well-developed.  HENT:     Head: Normocephalic and atraumatic.  Eyes:     Conjunctiva/sclera: Conjunctivae normal.     Pupils: Pupils are equal, round, and  reactive to light.  Cardiovascular:     Rate and Rhythm: Normal rate and regular rhythm.     Heart sounds: Normal heart sounds.  Pulmonary:     Effort: Pulmonary effort is normal. No respiratory distress.     Breath sounds: Normal breath sounds. No rhonchi.  Abdominal:     General: Bowel sounds are normal.     Palpations: Abdomen is soft.     Tenderness: There is no abdominal tenderness. There is no rebound.  Musculoskeletal:        General: Normal range of motion.     Cervical back: Normal range of motion.  Skin:    General: Skin is warm and dry.  Neurological:     Mental Status: He is alert and oriented to person, place, and time.     Comments: AAOx3, answering questions and following commands appropriately; equal strength UE and LE bilaterally; CN grossly intact; moves all extremities appropriately without ataxia; no focal neuro deficits or facial asymmetry appreciated     ED Results / Procedures / Treatments   Labs (all labs ordered are listed, but only abnormal results are displayed) Labs Reviewed  COMPREHENSIVE METABOLIC PANEL - Abnormal; Notable for the following components:      Result Value   Glucose, Bld 293 (*)    BUN 22 (*)    Creatinine, Ser 1.54 (*)    Calcium 8.7 (*)    GFR, Estimated 55 (*)    All other components within normal limits  CBG MONITORING, ED - Abnormal; Notable for the following components:   Glucose-Capillary 317 (*)    All other components within normal limits  CBC WITH DIFFERENTIAL/PLATELET  CK  URINALYSIS, ROUTINE W REFLEX MICROSCOPIC  TROPONIN I (HIGH SENSITIVITY)    EKG None  Radiology DG Chest 2 View  Result Date: 11/18/2021 CLINICAL DATA:  Near syncope. EXAM: CHEST - 2 VIEW COMPARISON:  Chest radiograph dated 01/01/2011. FINDINGS: The heart size and mediastinal contours are within normal limits. Both lungs are clear. The visualized skeletal structures are unremarkable. IMPRESSION: No active cardiopulmonary disease. Electronically  Signed   By: 03/03/2011 M.D.   On: 11/18/2021 23:16    Procedures Procedures  {Document cardiac monitor, telemetry assessment procedure when appropriate:1}  Medications Ordered in ED Medications  lactated ringers infusion ( Intravenous New Bag/Given 11/18/21 2138)  lactated ringers bolus 1,000 mL (has no administration in time range)  lactated ringers bolus 1,000 mL (1,000 mLs Intravenous New Bag/Given 11/18/21 2137)    ED Course/ Medical Decision Making/ A&P                           Medical Decision Making Amount and/or Complexity of Data Reviewed Labs: ordered. Radiology: ordered and independent  interpretation performed. ECG/medicine tests: ordered and independent interpretation performed.  Risk Prescription drug management.   51 year old male presenting to the ED with lightheadedness and near syncope.  Has been having episodes of this over the past year but worse over the past few weeks.  Does have history of vertigo but reports this feels different.  Had near syncopal episode today and wife found him to be hypotensive.  Hypotensive again on arrival to ED but improved when moved to treatment room.  He is awake, alert, oriented.  His exam is overall reassuring.  He has no focal neurologic deficits.  Vitals are stable currently.  He has not had any fever or other infectious symptoms to suggest sepsis.  From context of his symptoms, it sounds more likely related to orthostasis.  His EKG is reassuring.  Initial labs with normal white count which also argues against infection.  Does have a slight bump in SrCr and BUN, possibly some mild dehydration.  Hyperglycemic without findings of DKA.  Does admit recently ran out of lantus but started it again yesterday.  Was recently started on Crestor for HLP.  Will check CK, trop, CXR.  Check orthostatic vitals.  11:18 PM Found to have + orthostatic changes with standing-- BP 120's lying, down to 90's systolic with standing.  Given additional  IVF.  CK and troponin are WNL. Final Clinical Impression(s) / ED Diagnoses Final diagnoses:  None    Rx / DC Orders ED Discharge Orders     None

## 2021-11-18 NOTE — ED Triage Notes (Addendum)
Patient arrived with complaints of dizziness when walking. Complaints of heart palpitations and a headache over the last week. Currently on antibiotics for a right eye infection. Hypotensive in triage.

## 2021-11-18 NOTE — ED Notes (Signed)
Pt transport to XR 

## 2021-11-19 LAB — URINALYSIS, ROUTINE W REFLEX MICROSCOPIC
Bacteria, UA: NONE SEEN
Bilirubin Urine: NEGATIVE
Glucose, UA: >=500 mg/dL — AB
Hgb urine dipstick: NEGATIVE
Ketones, ur: NEGATIVE mg/dL
Leukocytes,Ua: NEGATIVE
Nitrite: NEGATIVE
Protein, ur: NEGATIVE mg/dL
Specific Gravity, Urine: 1.023 (ref 1.005–1.030)
pH: 5 (ref 5.0–8.0)

## 2021-11-19 NOTE — Discharge Instructions (Signed)
Labs today were reassuring.  Did have some drop in blood pressure with standing which may be contributing. Follow-up closely with your primary care doctor--copies of labs and EKG on back. Return here for any new or acute changes.

## 2021-11-19 NOTE — ED Notes (Signed)
I provided reinforced discharge education based off of after visit summary/care provided. Pt acknowledged and understood my education. Pt had no further questions/concerns for provider/myself. After visit summary provided to pt. 

## 2021-11-22 ENCOUNTER — Other Ambulatory Visit: Payer: Self-pay | Admitting: Internal Medicine

## 2021-11-22 ENCOUNTER — Other Ambulatory Visit: Payer: Self-pay

## 2021-11-22 DIAGNOSIS — E1165 Type 2 diabetes mellitus with hyperglycemia: Secondary | ICD-10-CM

## 2021-11-22 MED ORDER — TRUEPLUS PEN NEEDLES 31G X 5 MM MISC
3 refills | Status: DC
  Filled 2021-11-22: qty 100, 25d supply, fill #0
  Filled 2021-11-29: qty 100, 30d supply, fill #0
  Filled 2022-02-05: qty 100, 25d supply, fill #0

## 2021-11-23 ENCOUNTER — Telehealth (HOSPITAL_COMMUNITY): Payer: Self-pay | Admitting: *Deleted

## 2021-11-23 NOTE — Telephone Encounter (Signed)
Close encounter 

## 2021-11-27 ENCOUNTER — Ambulatory Visit (HOSPITAL_COMMUNITY)
Admission: RE | Admit: 2021-11-27 | Discharge: 2021-11-27 | Disposition: A | Discharge status: Home / Self Care | Payer: 59 | Source: Ambulatory Visit | Attending: Cardiovascular Disease | Admitting: Cardiovascular Disease

## 2021-11-27 ENCOUNTER — Other Ambulatory Visit: Payer: Self-pay

## 2021-11-27 DIAGNOSIS — R9431 Abnormal electrocardiogram [ECG] [EKG]: Secondary | ICD-10-CM | POA: Diagnosis not present

## 2021-11-27 LAB — MYOCARDIAL PERFUSION IMAGING
LV dias vol: 96 mL (ref 62–150)
LV sys vol: 42 mL
Nuc Stress EF: 57 %
Peak HR: 101 {beats}/min
Rest HR: 80 {beats}/min
Rest Nuclear Isotope Dose: 10.9 mCi
SDS: 0
SRS: 1
SSS: 1
ST Depression (mm): 0 mm
Stress Nuclear Isotope Dose: 32.9 mCi
TID: 1.33

## 2021-11-27 MED ORDER — TECHNETIUM TC 99M TETROFOSMIN IV KIT
10.9000 | PACK | Freq: Once | INTRAVENOUS | Status: AC | PRN
  Administered 2021-11-27: 10.9 via INTRAVENOUS

## 2021-11-27 MED ORDER — REGADENOSON 0.4 MG/5ML IV SOLN
0.4000 mg | Freq: Once | INTRAVENOUS | Status: AC
  Administered 2021-11-27: 0.4 mg via INTRAVENOUS

## 2021-11-27 MED ORDER — AMINOPHYLLINE 25 MG/ML IV SOLN
75.0000 mg | Freq: Once | INTRAVENOUS | Status: AC
  Administered 2021-11-27: 75 mg via INTRAVENOUS

## 2021-11-27 MED ORDER — TECHNETIUM TC 99M TETROFOSMIN IV KIT
32.9000 | PACK | Freq: Once | INTRAVENOUS | Status: AC | PRN
  Administered 2021-11-27: 32.9 via INTRAVENOUS

## 2021-11-28 ENCOUNTER — Other Ambulatory Visit: Payer: Self-pay

## 2021-11-29 ENCOUNTER — Other Ambulatory Visit: Payer: Self-pay

## 2021-11-30 ENCOUNTER — Other Ambulatory Visit: Payer: Self-pay

## 2021-12-04 ENCOUNTER — Other Ambulatory Visit: Payer: Self-pay

## 2021-12-04 MED ORDER — DEXCOM G6 SENSOR MISC
4 refills | Status: DC
  Filled 2021-12-04: qty 3, 28d supply, fill #0
  Filled 2022-01-10: qty 3, 28d supply, fill #1
  Filled 2022-02-05: qty 3, 28d supply, fill #2
  Filled 2022-03-07: qty 3, 28d supply, fill #3
  Filled 2022-04-17: qty 3, 28d supply, fill #4

## 2021-12-13 ENCOUNTER — Telehealth: Payer: Self-pay | Admitting: Internal Medicine

## 2021-12-13 NOTE — Telephone Encounter (Signed)
Patients insurance will not cover humalog - they will cover novalin - r flex pen injection - please send to [facility].

## 2021-12-14 ENCOUNTER — Other Ambulatory Visit: Payer: Self-pay

## 2021-12-14 ENCOUNTER — Other Ambulatory Visit: Payer: Self-pay | Admitting: Internal Medicine

## 2021-12-14 DIAGNOSIS — E139 Other specified diabetes mellitus without complications: Secondary | ICD-10-CM

## 2021-12-14 MED ORDER — NOVOLOG FLEXPEN 100 UNIT/ML ~~LOC~~ SOPN
10.0000 [IU] | PEN_INJECTOR | Freq: Three times a day (TID) | SUBCUTANEOUS | 0 refills | Status: DC
  Filled 2021-12-14: qty 9, 30d supply, fill #0
  Filled 2022-02-05: qty 9, 30d supply, fill #1

## 2021-12-17 ENCOUNTER — Other Ambulatory Visit: Payer: Self-pay

## 2022-01-07 ENCOUNTER — Other Ambulatory Visit: Payer: Self-pay

## 2022-01-08 ENCOUNTER — Other Ambulatory Visit: Payer: Self-pay

## 2022-01-09 ENCOUNTER — Other Ambulatory Visit: Payer: Self-pay

## 2022-01-10 ENCOUNTER — Other Ambulatory Visit: Payer: Self-pay

## 2022-01-21 ENCOUNTER — Ambulatory Visit (INDEPENDENT_AMBULATORY_CARE_PROVIDER_SITE_OTHER): Payer: 59 | Admitting: Internal Medicine

## 2022-01-21 ENCOUNTER — Other Ambulatory Visit: Payer: Self-pay

## 2022-01-21 ENCOUNTER — Encounter: Payer: Self-pay | Admitting: Internal Medicine

## 2022-01-21 VITALS — BP 106/68 | HR 81 | Temp 98.1°F | Resp 16 | Wt 269.0 lb

## 2022-01-21 DIAGNOSIS — E1143 Type 2 diabetes mellitus with diabetic autonomic (poly)neuropathy: Secondary | ICD-10-CM

## 2022-01-21 DIAGNOSIS — Z23 Encounter for immunization: Secondary | ICD-10-CM

## 2022-01-21 DIAGNOSIS — R002 Palpitations: Secondary | ICD-10-CM | POA: Diagnosis not present

## 2022-01-21 DIAGNOSIS — E139 Other specified diabetes mellitus without complications: Secondary | ICD-10-CM

## 2022-01-21 DIAGNOSIS — I951 Orthostatic hypotension: Secondary | ICD-10-CM | POA: Insufficient documentation

## 2022-01-21 DIAGNOSIS — I9589 Other hypotension: Secondary | ICD-10-CM

## 2022-01-21 DIAGNOSIS — I959 Hypotension, unspecified: Secondary | ICD-10-CM | POA: Insufficient documentation

## 2022-01-21 DIAGNOSIS — R42 Dizziness and giddiness: Secondary | ICD-10-CM | POA: Diagnosis not present

## 2022-01-21 LAB — BASIC METABOLIC PANEL
BUN: 33 mg/dL — ABNORMAL HIGH (ref 6–23)
CO2: 28 mEq/L (ref 19–32)
Calcium: 9.5 mg/dL (ref 8.4–10.5)
Chloride: 98 mEq/L (ref 96–112)
Creatinine, Ser: 1.45 mg/dL (ref 0.40–1.50)
GFR: 56.6 mL/min — ABNORMAL LOW (ref >60.00)
Glucose, Bld: 284 mg/dL — ABNORMAL HIGH (ref 70–99)
Potassium: 4.7 mEq/L (ref 3.5–5.1)
Sodium: 133 mEq/L — ABNORMAL LOW (ref 135–145)

## 2022-01-21 LAB — CORTISOL: Cortisol, Plasma: 5.1 ug/dL

## 2022-01-21 MED ORDER — OZEMPIC (0.25 OR 0.5 MG/DOSE) 2 MG/3ML ~~LOC~~ SOPN
0.2500 mg | PEN_INJECTOR | SUBCUTANEOUS | 0 refills | Status: DC
  Filled 2022-01-21: qty 3, 28d supply, fill #0

## 2022-01-21 NOTE — Patient Instructions (Signed)
Diabetic Neuropathy Diabetic neuropathy refers to nerve damage that is caused by diabetes. Over time, people with diabetes can develop nerve damage throughout the body. There are several types of diabetic neuropathy: Peripheral neuropathy. This is the most common type of diabetic neuropathy. It damages the nerves that carry signals between the spinal cord and other parts of the body (peripheral nerves). This usually affects nerves in the feet, legs, hands, and arms. Autonomic neuropathy. This type causes damage to nerves that control involuntary functions (autonomic nerves). Involuntary functions are functions of the body that you do not control. They include heartbeat, body temperature, blood pressure, urination, digestion, sweating, sexual function, or response to changes in blood glucose. Focal neuropathy. This type of nerve damage affects one area of the body, such as an arm, a leg, or the face. The injury may involve one nerve or a small group of nerves. Focal neuropathy can be painful and unpredictable. It occurs most often in older adults with diabetes. This often develops suddenly, but usually improves over time and does not cause long-term problems. Proximal neuropathy. This type of nerve damage affects the nerves of the thighs, hips, buttocks, or legs. It causes severe pain, weakness, and muscle death (atrophy), usually in the thigh muscles. It is more common among older men and people who have type 2 diabetes. The length of recovery time may vary. What are the causes? Peripheral, autonomic, and focal neuropathies are caused by diabetes that is not well controlled with treatment. The cause of proximal neuropathy is not known, but it may be caused by inflammation related to uncontrolled blood glucose levels. What are the signs or symptoms? Peripheral neuropathy Peripheral neuropathy develops slowly over time. When the nerves of the feet and legs no longer work, you may experience: Burning,  stabbing, or aching pain in the legs or feet. Pain or cramping in the legs or feet. Loss of feeling (numbness) and inability to feel pressure or pain in the feet. This can lead to: Thick calluses or sores on areas of constant pressure. Ulcers. Reduced ability to feel temperature changes. Foot deformities. Muscle weakness. Loss of balance or coordination. Autonomic neuropathy The symptoms of autonomic neuropathy vary depending on which nerves are affected. Symptoms may include: Problems with digestion, such as: Nausea or vomiting. Poor appetite. Bloating. Diarrhea or constipation. Trouble swallowing. Losing weight without trying to. Problems with the heart, blood, and lungs, such as: Dizziness, especially when standing up. Fainting. Shortness of breath. Irregular heartbeat. Bladder problems, such as: Trouble starting or stopping urination. Leaking urine. Trouble emptying the bladder. Urinary tract infections (UTIs). Problems with other body functions, such as: Sweat. You may sweat too much or too little. Temperature. You might get hot easily. Or, you might feel cold more than usual. Sexual function. Men may not be able to get or maintain an erection. Women may have vaginal dryness and difficulty with arousal. Focal neuropathy Symptoms affect only one area of the body. Common symptoms include: Numbness. Tingling. Burning pain. Prickling feeling. Very sensitive skin. Weakness. Inability to move (paralysis). Muscle twitching. Muscles getting smaller (wasting). Poor coordination. Double or blurred vision. Proximal neuropathy Sudden, severe pain in the hip, thigh, or buttocks. Pain may spread from the back into the legs (sciatica). Pain and numbness in the arms and legs. Tingling. Loss of bladder control or bowel control. Weakness and wasting of thigh muscles. Difficulty getting up from a seated position. Abdominal swelling. Unexplained weight loss. How is this  diagnosed? Diagnosis varies depending on the type   of neuropathy your health care provider suspects. Peripheral neuropathy Your health care provider will do a neurologic exam. This exam checks your reflexes, how you move, and what you can feel. You may have other tests, such as: Blood tests. Tests of the fluid that surrounds the spinal cord (lumbar puncture). CT scan. MRI. Checking the nerves that control muscles (electromyogram, or EMG). Checking how quickly signals pass through your nerves (nerve conduction study). Checking a small piece of a nerve using a microscope (biopsy). Autonomic neuropathy You may have tests, such as: Tests to measure your blood pressure and heart rate. You may be secured to an exam table that moves you from a lying position to an upright position (table tilt test). Breathing tests to check your lungs. Tests to check how food moves through the digestive system (gastric emptying tests). Blood, sweat, or urine tests. Ultrasound of your bladder. Spinal fluid tests. Focal neuropathy This condition may be diagnosed with: A neurologic exam. CT scan. MRI. EMG. Nerve conduction study. Proximal neuropathy There is no test to diagnose this type of neuropathy. You may have tests to rule out other possible causes of this type of neuropathy. Tests may include: X-rays of your spine and lumbar region. Lumbar puncture. MRI. How is this treated? The goal of treatment is to keep nerve damage from getting worse. Treatment may include: Following your diabetes management plan. This will help keep your blood glucose level and your A1C level within your target range. This is the most important treatment. Using prescription pain medicine. Follow these instructions at home: Diabetes management Follow your diabetes management plan as told by your health care provider. Check your blood glucose levels. Keep your blood glucose in your target range. Have your A1C level checked at  least two times a year, or as often as told. Take over the counter and prescription medicines only as told by your health care provider. This includes insulin and diabetes medicine.  Lifestyle  Do not use any products that contain nicotine or tobacco, such as cigarettes, e-cigarettes, and chewing tobacco. If you need help quitting, ask your health care provider. Be physically active every day. Include strength training and balance exercises. Follow a healthy meal plan. Work with your health care provider to manage your blood pressure. General instructions Ask your health care provider if the medicine prescribed to you requires you to avoid driving or using machinery. Check your skin and feet every day for cuts, bruises, redness, blisters, or sores. Keep all follow-up visits. This is important. Contact a health care provider if: You have burning, stabbing, or aching pain in your legs or feet. You are unable to feel pressure or pain in your feet. You develop problems with digestion, such as: Nausea. Vomiting. Bloating. Constipation. Diarrhea. Abdominal pain. You have difficulty with urination, such as: Inability to control when you urinate (incontinence). Inability to completely empty the bladder (retention). You feel as if your heart is racing (palpitations). You feel dizzy, weak, or faint when you stand up. Get help right away if: You cannot urinate. You have sudden weakness or loss of coordination. You have trouble speaking. You have pain or pressure in your chest. You have an irregular heartbeat. You have sudden inability to move a part of your body. These symptoms may represent a serious problem that is an emergency. Do not wait to see if the symptoms will go away. Get medical help right away. Call your local emergency services (911 in the [place] Do not drive yourself to   the hospital. Summary Diabetic neuropathy is nerve damage that is caused by diabetes. It can cause numbness  and pain in the arms, legs, digestive tract, heart, and other body systems. This condition is treated by keeping your blood glucose level and your A1C level within your target range. This can help prevent neuropathy from getting worse. Check your skin and feet every day for cuts, bruises, redness, blisters, or sores. Do not use any products that contain nicotine or tobacco, such as cigarettes, e-cigarettes, and chewing tobacco. This information is not intended to replace advice given to you by your health care provider. Make sure you discuss any questions you have with your health care provider. Document Revised: 07/29/2019 Document Reviewed: 07/29/2019 [employer] Patient Education  2023 [employer]  

## 2022-01-21 NOTE — Progress Notes (Signed)
Subjective:  Patient ID: Micheal Neal, male    DOB: 09/14/1970  Age: 51 y.o. MRN: [id number]  CC: Diabetes   HPI Micheal Neal presents for f/up -  He complains of palpitations, fatigue, low blood pressure, dizziness, lightheadedness, and near-syncope.  Outpatient Medications Prior to Visit  Medication Sig Dispense Refill   aspirin EC 81 MG tablet Take 81 mg by mouth daily. Swallow whole.     Continuous Blood Gluc Receiver (DEXCOM G6 RECEIVER) DEVI Use to check blood sugars 3x daily. DX: E11.40 1 each 5   Continuous Blood Gluc Sensor (DEXCOM G6 SENSOR) MISC use as directed daily 3 each 4   Continuous Blood Gluc Transmit (DEXCOM G6 TRANSMITTER) MISC Use to check blood sugars 3 times a day as directed 1 each 3   insulin aspart (NOVOLOG FLEXPEN) 100 UNIT/ML FlexPen Inject 10 Units into the skin 3 (three) times daily with meals. 27 mL 0   Insulin Glargine (BASAGLAR KWIKPEN) 100 UNIT/ML INJECT 30 UNITS INTO THE SKIN AT BEDTIME 27 mL 1   Insulin Pen Needle (TRUEPLUS PEN NEEDLES) 31G X 5 MM MISC USE AS DIRECTED FOUR TIMES DAILY 100 each 3   Insulin Syringe-Needle U-100 (BD INSULIN SYRINGE ULTRAFINE) 31G X 5/16" 0.5 ML MISC 1 each by Does not apply route 4 (four) times daily -  before meals and at bedtime. 100 each 11   omega-3 acid ethyl esters (LOVAZA) 1 g capsule Take 2 capsules (2 g total) by mouth 2 (two) times daily. 360 capsule 1   rosuvastatin (CRESTOR) 20 MG tablet Take 1 tablet (20 mg total) by mouth daily. 90 tablet 1   acetaminophen (TYLENOL) 500 MG tablet Take 1 tablet (500 mg total) by mouth every 6 (six) hours as needed. (Patient not taking: Reported on 11/18/2021) 30 tablet 0   glipiZIDE (GLUCOTROL XL) 10 MG 24 hr tablet Take 1 tablet (10 mg total) by mouth daily with breakfast. 90 tablet 0   No facility-administered medications prior to visit.    ROS Review of Systems  Constitutional:  Positive for fatigue. Negative for appetite change, chills, diaphoresis and unexpected  weight change.  HENT: Negative.    Eyes: Negative.   Respiratory:  Negative for cough, chest tightness, shortness of breath and wheezing.   Cardiovascular:  Negative for chest pain, palpitations and leg swelling.  Gastrointestinal:  Negative for abdominal pain, constipation, diarrhea, nausea and vomiting.  Endocrine: Negative.   Genitourinary: Negative.   Musculoskeletal: Negative.   Skin: Negative.   Neurological:  Positive for dizziness and light-headedness. Negative for syncope.  Hematological:  Negative for adenopathy. Does not bruise/bleed easily.  Psychiatric/Behavioral: Negative.      Objective:  BP 106/68 (BP Location: Right Arm, Patient Position: Sitting, Cuff Size: Normal)   Pulse 81   Temp 98.1 F (36.7 C) (Oral)   Resp 16   Wt 269 lb (122 kg)   SpO2 98%   BMI 34.54 kg/m   BP Readings from Last 3 Encounters:  01/21/22 106/68  11/19/21 127/84  11/06/21 (!) 82/60    Wt Readings from Last 3 Encounters:  01/21/22 269 lb (122 kg)  11/27/21 265 lb (120.2 kg)  11/18/21 265 lb (120.2 kg)    Physical Exam Vitals reviewed.  Constitutional:      Appearance: He is not ill-appearing.  HENT:     Mouth/Throat:     Mouth: Mucous membranes are moist.  Eyes:     General: No scleral icterus.    Conjunctiva/sclera: Conjunctivae normal.  Cardiovascular:     Rate and Rhythm: Normal rate and regular rhythm.     Heart sounds: No murmur heard. Pulmonary:     Effort: Pulmonary effort is normal.     Breath sounds: No stridor. No wheezing, rhonchi or rales.  Abdominal:     General: Abdomen is flat.     Palpations: There is no mass.     Tenderness: There is no abdominal tenderness. There is no guarding.     Hernia: No hernia is present.  Musculoskeletal:        General: Normal range of motion.     Cervical back: Neck supple.     Right lower leg: No edema.     Left lower leg: No edema.  Lymphadenopathy:     Cervical: No cervical adenopathy.  Skin:    General: Skin is  warm and dry.  Neurological:     General: No focal deficit present.     Mental Status: He is alert.  Psychiatric:        Mood and Affect: Mood normal.        Behavior: Behavior normal.     Lab Results  Component Value Date   WBC 8.9 11/18/2021   HGB 13.4 11/18/2021   HCT 40.1 11/18/2021   PLT 281 11/18/2021   GLUCOSE 284 (H) 01/21/2022   CHOL 279 (H) 11/06/2021   TRIG (H) 11/06/2021    893.0 Triglyceride is over 400; calculations on Lipids are invalid.   HDL 33.00 (L) 11/06/2021   LDLDIRECT 144.0 11/06/2021   LDLCALC 72 05/06/2018   ALT 33 11/18/2021   AST 29 11/18/2021   NA 133 (L) 01/21/2022   K 4.7 01/21/2022   CL 98 01/21/2022   CREATININE 1.45 01/21/2022   BUN 33 (H) 01/21/2022   CO2 28 01/21/2022   TSH 1.81 11/06/2021   HGBA1C 10.3 (H) 11/06/2021   MICROALBUR 3.0 (H) 11/06/2021    MYOCARDIAL PERFUSION IMAGING  Result Date: 11/27/2021   The study is normal. The study is low risk.   No ST deviation was noted.   Left ventricular function is normal. End diastolic cavity size is normal.   Prior study not available for comparison.    Assessment & Plan:   Micheal Neal was seen today for diabetes.  Diagnoses and all orders for this visit:  Heart palpitations- I am concerned he has autonomic dysfunction and have asked him to see cardiology. -     Cancel: CARDIAC EVENT MONITOR; Future -     Cancel: Ambulatory referral to Cardiology -     Ambulatory referral to Cardiology  Dizzy spells -     Cancel: CARDIAC EVENT MONITOR; Future -     Cancel: Ambulatory referral to Cardiology -     Ambulatory referral to Cardiology  Other specified hypotension -     Cancel: [place]; Future -     Cortisol; Future -     Cortisol  Diabetes 1.5, managed as type 1 ([place])- His blood sugar is not adequately well controlled.  Will add a GLP-1 agonist. -     Basic metabolic panel; Future -     Basic metabolic panel -     Discontinue: Semaglutide,0.25 or 0.5MG /DOS, (OZEMPIC,  0.25 OR 0.5 MG/DOSE,) 2 MG/3ML SOPN; Inject 0.25 mg into the skin once a week. -     Dulaglutide (TRULICITY) [id number] [id number] SOPN; Inject 0.75 mg into the skin once a week.  Autonomic dysfunction with type 2 diabetes mellitus ([place]) -  Cancel: Ambulatory referral to Cardiology -     Ambulatory referral to Cardiology  Need for vaccination -     Flu Vaccine QUAD 6+ mos PF IM (Fluarix Quad PF) -     Pneumococcal conjugate vaccine 20-valent (Prevnar 20)   I have discontinued [employer] "Anush Miano"'s acetaminophen, glipiZIDE, and Ozempic (0.25 or 0.5 MG/DOSE). I am also having him start on Trulicity. Additionally, I am having him maintain his Insulin Syringe-Needle U-100, omega-3 acid ethyl esters, Dexcom G6 Transmitter, rosuvastatin, Dexcom G6 Receiver, Basaglar KwikPen, aspirin EC, TRUEplus Pen Needles, Dexcom G6 Sensor, and NovoLOG FlexPen.  Meds ordered this encounter  Medications   DISCONTD: Semaglutide,0.25 or 0.5MG /DOS, (OZEMPIC, 0.25 OR 0.5 MG/DOSE,) 2 MG/3ML SOPN    Sig: Inject 0.25 mg into the skin once a week.    Dispense:  3 mL    Refill:  0   Dulaglutide (TRULICITY) [id number] [id number] SOPN    Sig: Inject 0.75 mg into the skin once a week.    Dispense:  2 mL    Refill:  0     Follow-up: Return in about 3 months (around 04/23/2022).  Scarlette Calico, MD

## 2022-01-22 ENCOUNTER — Other Ambulatory Visit: Payer: Self-pay

## 2022-01-23 ENCOUNTER — Other Ambulatory Visit: Payer: Self-pay

## 2022-01-24 ENCOUNTER — Telehealth: Payer: Self-pay

## 2022-01-24 NOTE — Telephone Encounter (Signed)
Key: [id number]

## 2022-01-24 NOTE — Progress Notes (Signed)
Chief Complaint  Patient presents with   New Patient (Initial Visit)    Dizziness   History of Present Illness: 51 yo male with history of diabetes, hyperlipidemia, vertigo and hypotension who is here today as a new consult, referred by Dr. Ronnald Ramp, for the evaluation of near syncope. He was seen in the ED at [facility] 11/18/21 with near syncope.  EKG without ischemic changes. BP was initially in the 01S systolic. Troponin negative. He was orthostatic in the ED and was given IV fluids. Nuclear stress test  11/27/21 with no ischemia. Normal LV function. He tells me today that he continues to have episodes of dizziness. This occurs with standing but also occurs when lying down when he moves his head quickly.   Primary Care Physician: [name], MD   Past Medical History:  Diagnosis Date   Abnormal EKG    Contusion of left foot    Diabetes mellitus without complication (HCC)    Dizziness    Near syncope    Palpitations    Vertigo     Past Surgical History:  Procedure Laterality Date   NO PAST SURGERIES     No surgeries  Current Outpatient Medications  Medication Sig Dispense Refill   aspirin EC 81 MG tablet Take 81 mg by mouth daily. Swallow whole.     Continuous Blood Gluc Receiver (DEXCOM G6 RECEIVER) DEVI Use to check blood sugars 3x daily. DX: E11.40 1 each 5   Continuous Blood Gluc Sensor (DEXCOM G6 SENSOR) MISC use as directed daily 3 each 4   Continuous Blood Gluc Transmit (DEXCOM G6 TRANSMITTER) MISC Use to check blood sugars 3 times a day as directed 1 each 3   insulin aspart (NOVOLOG FLEXPEN) 100 UNIT/ML FlexPen Inject 10 Units into the skin 3 (three) times daily with meals. 27 mL 0   Insulin Glargine (BASAGLAR KWIKPEN) 100 UNIT/ML INJECT 30 UNITS INTO THE SKIN AT BEDTIME 27 mL 1   Insulin Pen Needle (TRUEPLUS PEN NEEDLES) 31G X 5 MM MISC USE AS DIRECTED FOUR TIMES DAILY 100 each 3   Insulin Syringe-Needle U-100 (BD INSULIN SYRINGE ULTRAFINE) 31G X 5/16"  0.5 ML MISC 1 each by Does not apply route 4 (four) times daily -  before meals and at bedtime. 100 each 11   omega-3 acid ethyl esters (LOVAZA) 1 g capsule Take 2 capsules (2 g total) by mouth 2 (two) times daily. 360 capsule 1   rosuvastatin (CRESTOR) 20 MG tablet Take 1 tablet (20 mg total) by mouth daily. 90 tablet 1   Dulaglutide (TRULICITY) 0.10 XN/2.3FT SOPN Inject 0.75 mg into the skin once a week. (Patient not taking: Reported on 01/25/2022) 2 mL 0   No current facility-administered medications for this visit.    No Known Allergies  Social History   Socioeconomic History   Marital status: Married    Spouse name: Not on file   Number of children: 4   Years of education: Not on file   Highest education level: Not on file  Occupational History   Occupation: Maintenance Tech  Tobacco Use   Smoking status: Never   Smokeless tobacco: Never  Vaping Use   Vaping Use: Never used  Substance and Sexual Activity   Alcohol use: No   Drug use: No   Sexual activity: Yes    Partners: Female  Other Topics Concern   Not on file  Social History Narrative   Not on file   Social Determinants  of Health   Financial Resource Strain: Not on file  Food Insecurity: Not on file  Transportation Needs: Not on file  Physical Activity: Not on file  Stress: Not on file  Social Connections: Not on file  Intimate Partner Violence: Not on file    Family History  Problem Relation Age of Onset   Heart disease Mother    Breast cancer Maternal Grandmother        diagnosed late 23's   Breast cancer Maternal Aunt     Review of Systems:  As stated in the HPI and otherwise negative.   BP 98/60   Pulse 81   Ht 6\' 2"  (1.88 m)   Wt 271 lb 6.4 oz (123.1 kg)   SpO2 96%   BMI 34.85 kg/m   Physical Examination: General: Well developed, well nourished, NAD  HEENT: OP clear, mucus membranes moist  SKIN: warm, dry. No rashes. Neuro: No focal deficits  Musculoskeletal: Muscle strength 5/5 all  ext  Psychiatric: Mood and affect normal  Neck: No JVD, no carotid bruits, no thyromegaly, no lymphadenopathy.  Lungs:Clear bilaterally, no wheezes, rhonci, crackles Cardiovascular: Regular rate and rhythm. No murmurs, gallops or rubs. Abdomen:Soft. Bowel sounds present. Non-tender.  Extremities: No lower extremity edema. Pulses are 2 + in the bilateral DP/PT.  EKG:  EKG is ordered today. The ekg ordered today demonstrates NSR, Non-specific T wave abn  Recent Labs: 11/06/2021: TSH 1.81 11/18/2021: ALT 33; Hemoglobin 13.4; Platelets 281 01/21/2022: BUN 33; Creatinine, Ser 1.45; Potassium 4.7; Sodium 133   Lipid Panel    Component Value Date/Time   CHOL 279 (H) 11/06/2021 1627   CHOL 140 05/06/2018 0957   TRIG (H) 11/06/2021 1627    893.0 Triglyceride is over 400; calculations on Lipids are invalid.   HDL 33.00 (L) 11/06/2021 1627   HDL 40 05/06/2018 0957   CHOLHDL 8 11/06/2021 1627   VLDL NOT CALC 05/29/2016 1654   LDLCALC 72 05/06/2018 0957   LDLDIRECT 144.0 11/06/2021 1627     Wt Readings from Last 3 Encounters:  01/25/22 271 lb 6.4 oz (123.1 kg)  01/21/22 269 lb (122 kg)  11/27/21 265 lb (120.2 kg)    Assessment and Plan:   1. Dizziness/Orthostatic hypotension: Orthostatics today are positive. His symptoms may be related to orthostasis. I have encouraged him to drink extra fluids and add salt to his food. Will arrange an echo to assess LVEF and exclude structural heart disease. 14 day live cardiac monitor to exclude arrhythmias. He may need midodrine.   Labs/ tests ordered today include:   Orders Placed This Encounter  Procedures   LONG TERM MONITOR-LIVE TELEMETRY (3-14 DAYS)   EKG 12-Lead   ECHOCARDIOGRAM COMPLETE   Disposition:   F/U with me or office APP in 3-4 weeks.    Signed, 11/29/21, MD, [facility] 01/25/2022 11:56 AM    [facility] [address] [place], [place], [place]  [place] Phone: [phone]; Fax: [phone]

## 2022-01-25 ENCOUNTER — Encounter: Payer: Self-pay | Admitting: Cardiovascular Disease

## 2022-01-25 ENCOUNTER — Other Ambulatory Visit (INDEPENDENT_AMBULATORY_CARE_PROVIDER_SITE_OTHER): Payer: 59

## 2022-01-25 ENCOUNTER — Ambulatory Visit: Payer: 59 | Attending: Cardiovascular Disease | Admitting: Cardiovascular Disease

## 2022-01-25 ENCOUNTER — Other Ambulatory Visit: Payer: Self-pay

## 2022-01-25 VITALS — BP 98/60 | HR 81 | Ht 74.0 in | Wt 271.4 lb

## 2022-01-25 DIAGNOSIS — I951 Orthostatic hypotension: Secondary | ICD-10-CM

## 2022-01-25 DIAGNOSIS — R42 Dizziness and giddiness: Secondary | ICD-10-CM | POA: Diagnosis not present

## 2022-01-25 MED ORDER — TRULICITY 0.75 MG/0.5ML ~~LOC~~ SOAJ
0.7500 mg | SUBCUTANEOUS | 0 refills | Status: DC
  Filled 2022-01-25 – 2022-02-05 (×2): qty 2, 28d supply, fill #0

## 2022-01-25 NOTE — Progress Notes (Unsigned)
Applied a 14 day Zio AT monitor to patient in the office 

## 2022-01-25 NOTE — Patient Instructions (Addendum)
Extra salt and lots of extra fluids - not caffeinated  Medication Instructions:  No changes *If you need a refill on your cardiac medications before your next appointment, please call your pharmacy*   Lab Work: none   Testing/Procedures: Your physician has requested that you have an echocardiogram. Echocardiography is a painless test that uses sound waves to create images of your heart. It provides your doctor with information about the size and shape of your heart and how well your heart's chambers and valves are working. This procedure takes approximately one hour. There are no restrictions for this procedure. Please do NOT wear cologne, perfume, aftershave, or lotions (deodorant is allowed). Please arrive 15 minutes prior to your appointment time.  ZIO Heart Monitor - 14 days   Follow-Up: At [facility], you and your health needs are our priority.  As part of our continuing mission to provide you with exceptional heart care, we have created designated Provider Care Teams.  These Care Teams include your primary Cardiologist (physician) and Advanced Practice Providers (APPs -  Physician Assistants and Nurse Practitioners) who all work together to provide you with the care you need, when you need it.   Your next appointment:   4 week(s)  The format for your next appointment:   In Person  Provider:   Lauree Chandler, MD  or Advanced Practice Provider (NP or PA-C)   ZIO AT Long term monitor-Live Telemetry  Your physician has requested you wear a ZIO patch monitor for 14 days.  This is a single patch monitor. [employer] supplies one patch monitor per enrollment. Additional  stickers are not available.  Please do not apply patch if you will be having a Nuclear Stress Test, Echocardiogram, Cardiac CT, MRI,  or Chest Xray during the period you would be wearing the monitor. The patch cannot be worn during  these tests. You cannot remove and re-apply the ZIO AT patch  monitor.  Your ZIO patch monitor will be mailed 3 day USPS to your address on file. It may take 3-5 days to  receive your monitor after you have been enrolled.  Once you have received your monitor, please review the enclosed instructions. Your monitor has  already been registered assigning a specific monitor serial # to you.   Billing and Patient Assistance Program information  Theodore Demark has been supplied with any insurance information on record for billing. [employer] offers a sliding scale Patient Assistance Program for patients without insurance, or whose  insurance does not completely cover the cost of the ZIO patch monitor. You must apply for the  Patient Assistance Program to qualify for the discounted rate. To apply, call [employer] at [phone],  select option 4, select option 2 , ask to apply for the Patient Assistance Program, (you can request an  interpreter if needed). [employer] will ask your household income and how many people are in your  household. [employer] will quote your out-of-pocket cost based on this information. They will also be able  to set up a 12 month interest free payment plan if needed.  Applying the monitor   Shave hair from upper left chest.  Hold the abrader disc by orange tab. Rub the abrader in 40 strokes over left upper chest as indicated in  your monitor instructions.  Clean area with 4 enclosed alcohol pads. Use all pads to ensure the area is cleaned thoroughly. Let  dry.  Apply patch as indicated in monitor instructions. Patch will be placed under collarbone on left  side of  chest with arrow pointing upward.  Rub patch adhesive wings for 2 minutes. Remove the white label marked "1". Remove the white label  marked "2". Rub patch adhesive wings for 2 additional minutes.  While looking in a mirror, press and release button in center of patch. A small green light will flash 3-4  times. This will be your only indicator that the monitor has been turned on.  Do  not shower for the first 24 hours. You may shower after the first 24 hours.  Press the button if you feel a symptom. You will hear a small click. Record Date, Time and Symptom in  the Patient Log.   Starting the Gateway  In your kit there is a Hydrographic surveyor box the size of a cellphone. This is Airline pilot. It transmits all your  recorded data to [facility]. This box must always stay within 10 feet of you. Open the box and push the *  button. There will be a light that blinks orange and then green a few times. When the light stops  blinking, the Gateway is connected to the ZIO patch. Call [employer] at [phone] to confirm your monitor is transmitting.  Returning your monitor  Remove your patch and place it inside the [place]. In the lower half of the Gateway there is a white  bag with prepaid postage on it. Place Gateway in bag and seal. Mail package back to [place] as soon as  possible. Your physician should have your final report approximately 7 days after you have mailed back  your monitor. Call [place] at [phone] if you have questions regarding your ZIO AT  patch monitor. Call them immediately if you see an orange light blinking on your monitor.  If your monitor falls off in less than 4 days, contact our Monitor department at [phone]. If your  monitor becomes loose or falls off after 4 days call [employer] at [phone] for suggestions on  securing your monitor   Important Information About Sugar

## 2022-01-25 NOTE — Telephone Encounter (Signed)
PA was denied   Reason Given:  Coverage for this medication is denied for the following reason(s). We reviewed the information we received about your condition and circumstances. We used the plan approved policy when making this decision. The policy states that this medication may be approved when: -The member has a clinical condition or needs a specific dosage form for which there is no alternative on the formulary OR -The listed formulary alternatives are not recommended based on published guidelines or clinical literature OR -The formulary alternatives will likely be ineffective or less effective for the member OR -The formulary alternatives will likely cause an adverse effect OR -The member is unable to take the required number of formulary alternatives for the given diagnosis  due to a trial and inadequate treatment response or contraindication OR -The member has tried and failed the required number of formulary alternatives. Based on the policy and the information we have, your request is denied. We did not receive any documentation that you meet any of the criteria outlined above. Formulary alternative(s) are Victoza, Trulicity. Requirement: 3 in a class with 3 or more alternatives, 2 in a class with 2 alternatives, or 1 in a class with only 1 alternative. Please refer to your plan documents for a complete list of alternatives.

## 2022-01-26 DIAGNOSIS — R42 Dizziness and giddiness: Secondary | ICD-10-CM | POA: Diagnosis not present

## 2022-01-28 ENCOUNTER — Other Ambulatory Visit: Payer: Self-pay

## 2022-01-29 ENCOUNTER — Other Ambulatory Visit: Payer: Self-pay

## 2022-01-31 ENCOUNTER — Other Ambulatory Visit: Payer: Self-pay

## 2022-02-01 ENCOUNTER — Other Ambulatory Visit: Payer: Self-pay

## 2022-02-04 ENCOUNTER — Other Ambulatory Visit: Payer: Self-pay

## 2022-02-05 ENCOUNTER — Other Ambulatory Visit: Payer: Self-pay

## 2022-02-05 ENCOUNTER — Other Ambulatory Visit: Payer: Self-pay | Admitting: Internal Medicine

## 2022-02-05 MED ORDER — ASPIRIN 81 MG PO TBEC
81.0000 mg | DELAYED_RELEASE_TABLET | Freq: Every day | ORAL | 1 refills | Status: DC
  Filled 2022-02-05: qty 30, 30d supply, fill #0
  Filled 2022-03-19: qty 30, 30d supply, fill #1
  Filled 2022-06-04: qty 30, 30d supply, fill #2
  Filled 2022-06-26 – 2022-07-01 (×2): qty 30, 30d supply, fill #3
  Filled 2022-07-16 – 2022-07-29 (×2): qty 30, 30d supply, fill #4
  Filled 2022-08-28: qty 30, 30d supply, fill #5

## 2022-02-06 ENCOUNTER — Other Ambulatory Visit: Payer: Self-pay

## 2022-02-08 ENCOUNTER — Other Ambulatory Visit (HOSPITAL_COMMUNITY): Payer: 59

## 2022-02-14 ENCOUNTER — Other Ambulatory Visit: Payer: Self-pay

## 2022-02-15 ENCOUNTER — Ambulatory Visit (HOSPITAL_COMMUNITY): Payer: 59 | Attending: Cardiovascular Disease

## 2022-02-15 DIAGNOSIS — I951 Orthostatic hypotension: Secondary | ICD-10-CM | POA: Diagnosis not present

## 2022-02-15 DIAGNOSIS — I517 Cardiomegaly: Secondary | ICD-10-CM

## 2022-02-15 DIAGNOSIS — R42 Dizziness and giddiness: Secondary | ICD-10-CM | POA: Diagnosis not present

## 2022-02-15 DIAGNOSIS — I77810 Thoracic aortic ectasia: Secondary | ICD-10-CM | POA: Diagnosis not present

## 2022-02-15 LAB — ECHOCARDIOGRAM COMPLETE
Area-P 1/2: 4.36 cm2
S' Lateral: 2.8 cm

## 2022-02-23 NOTE — Progress Notes (Signed)
Cardiology Office Note:    Date:  02/25/2022   ID:  Micheal Neal, DOB [birth year], MRN [id number]  PCP:  [name], MD   [facility] Providers Cardiologist:  Lauree Chandler, MD     Referring MD: [name], MD   Chief Complaint: Orthostatic hypotension  History of Present Illness:    Micheal Neal is a very pleasant 51 y.o. male with a hx of diabetes, HLD, vertigo, and hypotension  Referred to cardiology and seen by Dr. Angelena Form on 01/25/2022 for near syncope.  Seen in the ED at [facility] 11/18/2021 with near syncope.  EKG without ischemic changes.  BP was initially in the 25E systolic.  Troponin negative.  He was orthostatic in the ED and was given IV fluids.  Nuclear stress test 11/27/2021 with no ischemia.  Normal LV function.  At office visit, reports he continues to have episodes of dizziness.  This occurs with standing but also when lying down when he moves his head quickly.  Orthostatic vital signs were positive.  He was encouraged to drink extra fluids and add salt to his food.  2D echo was ordered to assess LVEF and valve function.  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 Cardiac monitor revealed predominant underlying rhythm sinus rhythm, 6 SVT runs occurred, the longest lasting 11.4 seconds, isolated SVE's were rare, isolated VE's were rare, no atrial fibrillation.   Today, he is here for follow-up.  Reports he continues to have episodes of feeling his BP drop.  At times feels that he would have passed out if he did not sit down. No syncope. Symptoms typically improve in 10 minutes or less. Had to quit 2 jobs due to symptoms. Typically eats only breakfast and dinner and drinks 5-6 bottles water daily. Typical diet is egg and cheese on English muffin for breakfast and mashed potatoes and meat (not fried) for dinner. Is  currently not working and does not exercise on a consistent basis. He denies chest pain, shortness of breath, lower extremity edema, fatigue, palpitations, diaphoresis, weakness, orthopnea, and PND.  Past Medical History:  Diagnosis Date   Abnormal EKG    Contusion of left foot    Diabetes mellitus without complication (HCC)    Dizziness    Near syncope    Palpitations    Vertigo     Past Surgical History:  Procedure Laterality Date   NO PAST SURGERIES      Current Medications: Current Meds  Medication Sig   aspirin EC 81 MG tablet Take 1 tablet (81 mg total) by mouth daily. Swallow whole.   Continuous Blood Gluc Receiver (DEXCOM G6 RECEIVER) DEVI Use to check blood sugars 3x daily. DX: E11.40   Continuous Blood Gluc Sensor (DEXCOM G6 SENSOR) MISC use as directed daily   Continuous Blood Gluc Transmit (DEXCOM G6 TRANSMITTER) MISC Use to check blood sugars 3 times a day as directed   insulin aspart (NOVOLOG FLEXPEN) 100 UNIT/ML FlexPen Inject 10 Units into the skin 3 (three) times daily with meals.   Insulin Glargine (BASAGLAR KWIKPEN) 100 UNIT/ML INJECT 30 UNITS INTO THE SKIN AT BEDTIME   Insulin Pen Needle (TRUEPLUS PEN NEEDLES) 31G X 5 MM MISC USE AS DIRECTED FOUR TIMES DAILY   Insulin Syringe-Needle U-100 (BD INSULIN SYRINGE ULTRAFINE) 31G X 5/16" 0.5 ML MISC 1 each by Does not apply route  4 (four) times daily -  before meals and at bedtime.   omega-3 acid ethyl esters (LOVAZA) 1 g capsule Take 2 capsules (2 g total) by mouth 2 (two) times daily.   rosuvastatin (CRESTOR) 20 MG tablet Take 1 tablet (20 mg total) by mouth daily.     Allergies:   Patient has no known allergies.   Social History   Socioeconomic History   Marital status: Married    Spouse name: Not on file   Number of children: 4   Years of education: Not on file   Highest education level: Not on file  Occupational History   Occupation: Maintenance Tech  Tobacco Use   Smoking status: Never   Smokeless  tobacco: Never  Vaping Use   Vaping Use: Never used  Substance and Sexual Activity   Alcohol use: No   Drug use: No   Sexual activity: Yes    Partners: Female  Other Topics Concern   Not on file  Social History Narrative   Not on file   Social Determinants of Health   Financial Resource Strain: Not on file  Food Insecurity: Not on file  Transportation Needs: Not on file  Physical Activity: Not on file  Stress: Not on file  Social Connections: Not on file     Family History: The patient's family history includes Breast cancer in his maternal aunt and maternal grandmother; Heart disease in his mother.  ROS:   Please see the history of present illness.    + occasional orthostatic hypotension All other systems reviewed and are negative.  Labs/Other Studies Reviewed:    The following studies were reviewed today:  Cardiac Monitor 02/20/22  Patient had a min HR of 64 bpm, max HR of 147 bpm, and avg HR of 86 bpm. Predominant underlying rhythm was Sinus Rhythm. 6 Supraventricular Tachycardia runs occurred, the run with the fastest interval lasting 6 beats with a max rate of 141 bpm, the  longest lasting 11.4 secs with an avg rate of 126 bpm. Isolated SVEs were rare (<1.0%), SVE Couplets were rare (<1.0%), and SVE Triplets were rare (<1.0%). Isolated VEs were rare (<1.0%), and no VE Couplets or VE Triplets were present. Ventricular  Bigeminy was present.  Echo 02/15/22  1. Left ventricular ejection fraction, by estimation, is 60 to 65%. The  left ventricle has normal function. The left ventricle has no regional  wall motion abnormalities. There is mild left ventricular hypertrophy.  Left ventricular diastolic parameters  were normal.   2. Right ventricular systolic function is normal. The right ventricular  size is normal. Tricuspid regurgitation signal is inadequate for assessing  PA pressure.   3. The mitral valve is normal in structure. No evidence of mitral valve   regurgitation. No evidence of mitral stenosis.   4. The aortic valve was not well visualized. Aortic valve regurgitation  is not visualized. No aortic stenosis is present.   5. Aortic dilatation noted. There is mild dilatation of the ascending  aorta, measuring 39 mm.  [place] 11/27/21    The study is normal. The study is low risk.   No ST deviation was noted.   Left ventricular function is normal. End diastolic cavity size is normal.   Prior study not available for comparison.  Recent Labs: 11/06/2021: TSH 1.81 11/18/2021: ALT 33; Hemoglobin 13.4; Platelets 281 01/21/2022: BUN 33; Creatinine, Ser 1.45; Potassium 4.7; Sodium 133  Recent Lipid Panel    Component Value Date/Time   CHOL 279 (H)  11/06/2021 1627   CHOL 140 05/06/2018 0957   TRIG (H) 11/06/2021 1627    893.0 Triglyceride is over 400; calculations on Lipids are invalid.   HDL 33.00 (L) 11/06/2021 1627   HDL 40 05/06/2018 0957   CHOLHDL 8 11/06/2021 1627   VLDL NOT CALC 05/29/2016 1654   LDLCALC 72 05/06/2018 0957   LDLDIRECT 144.0 11/06/2021 1627     Risk Assessment/Calculations:       Physical Exam:    VS:  BP 98/62   Pulse 92   Ht _0  (1.88 m)   Wt 274 lb 9.6 oz (124.6 kg)   SpO2 99%   BMI 35.26 kg/m     Wt Readings from Last 3 Encounters:  02/25/22 274 lb 9.6 oz (124.6 kg)  01/25/22 271 lb 6.4 oz (123.1 kg)  01/21/22 269 lb (122 kg)     GEN:  Well nourished, well developed in no acute distress HEENT: Normal NECK: No JVD; No carotid bruits CARDIAC: RRR, no murmurs, rubs, gallops RESPIRATORY:  Clear to auscultation without rales, wheezing or rhonchi  ABDOMEN: Soft, non-tender, non-distended MUSCULOSKELETAL:  No edema; No deformity. 2+ pedal pulses, equal bilaterally SKIN: Warm and dry NEUROLOGIC:  Alert and oriented x 3 PSYCHIATRIC:  Normal affect   EKG:  EKG is not ordered today.    Diagnoses:    1. Hypertriglyceridemia   2. Hyperlipidemia with target LDL less than 100   3.  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 Occasional episodes of feeling BP drop with concomitant lightheadedness and near syncope. No frank syncope. Encouraged him to improve nutrition to include more fruits, vegetables, and whole grains and to avoid prolonged periods of time without eating. Discussion of starting midodrine, however I think some of these episodes may be avoided with better nutrition.  Through shared decision making, he would like to work on better diet and regular exercise prior to starting medication. Advised him to notify us [place] if symptoms worsen prior to next appointment. Refer for diabetic nutrition counseling.   Hyperlipidemia/hypertriglyceridemia: LDL direct 144, triglycerides 893, HDL 33 on 11/06/2021.  Was started on Lovaza 2 g twice daily and Crestor 20 mg daily.  We will recheck fasting lipid panel and complete metabolic panel on 73/41 when he can return to the office fasting.   Diabetes: On insulin. Emphasized the importance of healthy diet, maintaining a pattern of eating small meals consistently throughout the day, and 150 minutes of moderate intensity exercise each week. Referral to diabetic nutritionist. Management per PCP.     Disposition: 6 months with Dr. Angelena Form or APP  Medication Adjustments/Labs and Tests Ordered: Current medicines are reviewed at length with the patient today.  Concerns regarding medicines are outlined above.  Orders Placed This Encounter  Procedures   Comp Met (CMET)   Lipid panel   Ambulatory referral to Nutrition and Diabetic Education   No orders of the defined types were placed in this encounter.   Patient Instructions  Medication Instructions:  Your physician recommends that you continue on your current medications as directed. Please refer to the Current Medication list given to you today.  *If you need a refill on your  cardiac medications before your next appointment, please call your pharmacy*   Lab Work: TOMORROW COME IN FOR FASTING:  LIPID & CMET  you can come anytime after 7:15 in the morning but  make sure nothing to eat / drink after midnight tonight.  If you have labs (blood work) drawn today and your tests are completely normal, you will receive your results only by: [place] (if you have MyChart) OR A paper copy in the mail If you have any lab test that is abnormal or we need to change your treatment, we will call you to review the results.   Testing/Procedures: None ordered   Follow-Up: At [facility], you and your health needs are our priority.  As part of our continuing mission to provide you with exceptional heart care, we have created designated Provider Care Teams.  These Care Teams include your primary Cardiologist (physician) and Advanced Practice Providers (APPs -  Physician Assistants and Nurse Practitioners) who all work together to provide you with the care you need, when you need it.  We recommend signing up for the patient portal called "MyChart".  Sign up information is provided on this After Visit Summary.  MyChart is used to connect with patients for Virtual Visits (Telemedicine).  Patients are able to view lab/test results, encounter notes, upcoming appointments, etc.  Non-urgent messages can be sent to your provider as well.   To learn more about what you can do with MyChart, go to [email or web address].    Your next appointment:   6 month(s)  The format for your next appointment:   In Person  Provider:   Lauree Chandler, MD      Other Instructions   Important Information About Sugar         Signed, Emmaline Life, NP  02/25/2022 9:56 AM    [place]

## 2022-02-25 ENCOUNTER — Ambulatory Visit: Payer: 59 | Attending: Nurse Practitioner | Admitting: Nurse Practitioner

## 2022-02-25 ENCOUNTER — Encounter: Payer: Self-pay | Admitting: Nurse Practitioner

## 2022-02-25 VITALS — BP 98/62 | HR 92 | Ht 74.0 in | Wt 274.6 lb

## 2022-02-25 DIAGNOSIS — I951 Orthostatic hypotension: Secondary | ICD-10-CM

## 2022-02-25 DIAGNOSIS — E785 Hyperlipidemia, unspecified: Secondary | ICD-10-CM | POA: Diagnosis not present

## 2022-02-25 DIAGNOSIS — E139 Other specified diabetes mellitus without complications: Secondary | ICD-10-CM | POA: Diagnosis not present

## 2022-02-25 DIAGNOSIS — E781 Pure hyperglyceridemia: Secondary | ICD-10-CM | POA: Diagnosis not present

## 2022-02-25 NOTE — Patient Instructions (Signed)
Medication Instructions:  Your physician recommends that you continue on your current medications as directed. Please refer to the Current Medication list given to you today.  *If you need a refill on your cardiac medications before your next appointment, please call your pharmacy*   Lab Work: TOMORROW COME IN FOR FASTING:  LIPID & CMET  you can come anytime after 7:15 in the morning but make sure nothing to eat / drink after midnight tonight.  If you have labs (blood work) drawn today and your tests are completely normal, you will receive your results only by: MyChart Message (if you have MyChart) OR A paper copy in the mail If you have any lab test that is abnormal or we need to change your treatment, we will call you to review the results.   Testing/Procedures: None ordered   Follow-Up: At [facility], you and your health needs are our priority.  As part of our continuing mission to provide you with exceptional heart care, we have created designated Provider Care Teams.  These Care Teams include your primary Cardiologist (physician) and Advanced Practice Providers (APPs -  Physician Assistants and Nurse Practitioners) who all work together to provide you with the care you need, when you need it.  We recommend signing up for the patient portal called "MyChart".  Sign up information is provided on this After Visit Summary.  MyChart is used to connect with patients for Virtual Visits (Telemedicine).  Patients are able to view lab/test results, encounter notes, upcoming appointments, etc.  Non-urgent messages can be sent to your provider as well.   To learn more about what you can do with MyChart, go to [email or web address].    Your next appointment:   6 month(s)  The format for your next appointment:   In Person  Provider:   Verne Carrow, MD      Other Instructions   Important Information About Sugar

## 2022-02-26 ENCOUNTER — Ambulatory Visit: Payer: 59 | Attending: Nurse Practitioner

## 2022-02-26 DIAGNOSIS — E781 Pure hyperglyceridemia: Secondary | ICD-10-CM | POA: Diagnosis not present

## 2022-02-26 DIAGNOSIS — E785 Hyperlipidemia, unspecified: Secondary | ICD-10-CM | POA: Diagnosis not present

## 2022-02-26 LAB — COMPREHENSIVE METABOLIC PANEL
ALT: 23 IU/L (ref 0–44)
AST: 16 IU/L (ref 0–40)
Albumin/Globulin Ratio: 1.3 (ref 1.2–2.2)
Albumin: 4.1 g/dL (ref 4.1–5.1)
Alkaline Phosphatase: 110 IU/L (ref 44–121)
BUN/Creatinine Ratio: 20 (ref 9–20)
BUN: 22 mg/dL (ref 6–24)
Bilirubin Total: 0.4 mg/dL (ref 0.0–1.2)
CO2: 23 mmol/L (ref 20–29)
Calcium: 8.8 mg/dL (ref 8.7–10.2)
Chloride: 98 mmol/L (ref 96–106)
Creatinine, Ser: 1.11 mg/dL (ref 0.76–1.27)
Globulin, Total: 3.2 g/dL (ref 1.5–4.5)
Glucose: 316 mg/dL — ABNORMAL HIGH (ref 70–99)
Potassium: 4.5 mmol/L (ref 3.5–5.2)
Sodium: 135 mmol/L (ref 134–144)
Total Protein: 7.3 g/dL (ref 6.0–8.5)
eGFR: 81 mL/min/{1.73_m2} (ref >59)

## 2022-02-26 LAB — LIPID PANEL
Chol/HDL Ratio: 4.7 ratio (ref 0.0–5.0)
Cholesterol, Total: 145 mg/dL (ref 100–199)
HDL: 31 mg/dL — ABNORMAL LOW (ref >39)
LDL Chol Calc (NIH): 72 mg/dL (ref 0–99)
Triglycerides: 259 mg/dL — ABNORMAL HIGH (ref 0–149)
VLDL Cholesterol Cal: 42 mg/dL — ABNORMAL HIGH (ref 5–40)

## 2022-03-07 ENCOUNTER — Other Ambulatory Visit: Payer: Self-pay

## 2022-03-12 ENCOUNTER — Other Ambulatory Visit (HOSPITAL_COMMUNITY): Payer: Self-pay

## 2022-03-12 ENCOUNTER — Other Ambulatory Visit: Payer: Self-pay

## 2022-03-18 ENCOUNTER — Telehealth: Payer: Self-pay | Admitting: Internal Medicine

## 2022-03-18 NOTE — Telephone Encounter (Signed)
Patient called and said he needs a referral to see the foot doctor for his Diabetes

## 2022-03-20 ENCOUNTER — Other Ambulatory Visit (HOSPITAL_COMMUNITY): Payer: Self-pay

## 2022-03-20 ENCOUNTER — Other Ambulatory Visit: Payer: Self-pay

## 2022-03-29 ENCOUNTER — Encounter: Payer: Self-pay | Admitting: Emergency Medicine

## 2022-03-29 ENCOUNTER — Ambulatory Visit
Admission: EM | Admit: 2022-03-29 | Discharge: 2022-03-29 | Disposition: A | Discharge status: Home / Self Care | Payer: 59 | Attending: Family Medicine | Admitting: Family Medicine

## 2022-03-29 DIAGNOSIS — K0889 Other specified disorders of teeth and supporting structures: Secondary | ICD-10-CM | POA: Diagnosis not present

## 2022-03-29 MED ORDER — CHLORHEXIDINE GLUCONATE 0.12 % MT SOLN: 15.0000 mL | Freq: Two times a day (BID) | OROMUCOSAL | 0 refills | Status: DC

## 2022-03-29 MED ORDER — AMOXICILLIN-POT CLAVULANATE 875-125 MG PO TABS: 1.0000 | ORAL_TABLET | Freq: Two times a day (BID) | ORAL | 0 refills | Status: DC

## 2022-03-29 NOTE — ED Triage Notes (Signed)
Upper dental pain that radiates across top of mouth x 3 weeks.  Unable to see dentist for 2 months.

## 2022-03-29 NOTE — ED Provider Notes (Signed)
[facility]    CSN: [id number] Arrival date & time: 03/29/22  1412      History   Chief Complaint Chief Complaint  Patient presents with   Dental Pain    HPI Micheal Neal is a 51 y.o. male.   Presenting today with 3-week history of pain in the roof of his mouth near his gumline.  He denies drainage, bleeding, history of dental issues, fever, chills, difficulty breathing or swallowing.  Trying ibuprofen and Orajel with minimal relief.  Unable to get into his dentist for 2 months.    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 Authorizing Provider  amoxicillin-clavulanate (AUGMENTIN) 875-125 MG tablet Take 1 tablet by mouth every 12 (twelve) hours. 03/29/22  Yes Particia Nearing, PA-C  chlorhexidine (PERIDEX) 0.12 % solution Use as directed 15 mLs in the mouth or throat 2 (two) times daily. 03/29/22  Yes Particia Nearing, PA-C  aspirin EC 81 MG tablet Take 1 tablet (81 mg total) by mouth daily. Swallow  whole. 02/05/22   Etta Grandchild, MD  Continuous Blood Gluc Receiver (DEXCOM G6 RECEIVER) DEVI Use to check blood sugars 3x daily. DX: E11.40 11/08/21   Etta Grandchild, MD  Continuous Blood Gluc Sensor (DEXCOM G6 SENSOR) MISC use as directed daily 11/07/21   Etta Grandchild, MD  Continuous Blood Gluc Transmit (DEXCOM G6 TRANSMITTER) MISC Use to check blood sugars 3 times a day as directed 11/07/21   Etta Grandchild, MD  insulin aspart (NOVOLOG FLEXPEN) 100 UNIT/ML FlexPen Inject 10 Units into the skin 3 (three) times daily with meals. 12/14/21   Etta Grandchild, MD  Insulin Glargine [facility]) 100 UNIT/ML INJECT 30 UNITS INTO THE SKIN AT BEDTIME 11/07/21   Etta Grandchild, MD  Insulin Pen Needle (TRUEPLUS PEN NEEDLES) 31G X 5 MM MISC USE AS DIRECTED FOUR TIMES DAILY 11/22/21   Etta Grandchild, MD  omega-3 acid ethyl esters (LOVAZA) 1 g capsule Take 2 capsules (2 g total) by mouth 2 (two) times daily. 11/07/21   Etta Grandchild, MD  rosuvastatin (CRESTOR) 20 MG tablet Take 1 tablet (20 mg total) by mouth daily. 11/07/21   Etta Grandchild, MD    Family History Family History  Problem Relation Age of Onset   Heart disease Mother    Breast cancer Maternal Grandmother        diagnosed late 50's   Breast cancer Maternal Aunt  Social History Social History   Tobacco Use   Smoking status: Never   Smokeless tobacco: Never  Vaping Use   Vaping Use: Never used  Substance Use Topics   Alcohol use: No   Drug use: No     Allergies   Patient has no known allergies.   Review of Systems Review of Systems PER HPI  Physical Exam Triage Vital Signs ED Triage Vitals  Enc Vitals Group     BP 03/29/22 1737 115/75     Pulse Rate 03/29/22 1734 (!) 108     Resp 03/29/22 1734 20     Temp 03/29/22 1734 98.6 F (37 C)     Temp Source 03/29/22 1734 Oral     SpO2 03/29/22 1734 99 %     Weight --      Height --      Head Circumference --      Peak Flow --      Pain Score 03/29/22 1736 10      Pain Loc --      Pain Edu? --      Excl. in GC? --    No data found.  Updated Vital Signs BP 115/75 (BP Location: Right Arm)   Pulse (!) 108   Temp 98.6 F (37 C) (Oral)   Resp 20   SpO2 99%   Visual Acuity Right Eye Distance:   Left Eye Distance:   Bilateral Distance:    Right Eye Near:   Left Eye Near:    Bilateral Near:     Physical Exam Vitals and nursing note reviewed.  Constitutional:      Appearance: Normal appearance.  HENT:     Head: Atraumatic.     Mouth/Throat:     Mouth: Mucous membranes are moist.     Pharynx: Oropharynx is clear.     Comments: Erythematous and edematous area to the posterior gumline of the upper jaw, no drainage, bleeding, induration Eyes:     Extraocular Movements: Extraocular movements intact.     Conjunctiva/sclera: Conjunctivae normal.  Cardiovascular:     Rate and Rhythm: Normal rate and regular rhythm.  Pulmonary:     Effort: Pulmonary effort is normal.     Breath sounds: Normal breath sounds. No wheezing or rales.  Musculoskeletal:        General: Normal range of motion.     Cervical back: Normal range of motion and neck supple.  Skin:    General: Skin is warm and dry.  Neurological:     General: No focal deficit present.     Mental Status: He is oriented to person, place, and time.     Motor: No weakness.     Gait: Gait normal.  Psychiatric:        Mood and Affect: Mood normal.        Thought Content: Thought content normal.        Judgment: Judgment normal.    UC Treatments / Results  Labs (all labs ordered are listed, but only abnormal results are displayed) Labs Reviewed - No data to display  EKG   Radiology No results found.  Procedures Procedures (including critical care time)  Medications Ordered in UC Medications - No data to display  Initial Impression / Assessment and Plan / UC Course  I have reviewed the triage vital signs and the nursing notes.  Pertinent labs & imaging results that were  available during my care of the patient were reviewed by me and  considered in my medical decision making (see chart for details).     Treat with Augmentin, Peridex, ibuprofen and Tylenol.  Close dental follow-up as soon as possible.  Final Clinical Impressions(s) / UC Diagnoses   Final diagnoses:  Pain, dental   Discharge Instructions   None    ED Prescriptions     Medication Sig Dispense Auth. Provider   amoxicillin-clavulanate (AUGMENTIN) 875-125 MG tablet Take 1 tablet by mouth every 12 (twelve) hours. 14 tablet Particia Nearing, [place]   chlorhexidine (PERIDEX) 0.12 % solution Use as directed 15 mLs in the mouth or throat 2 (two) times daily. 120 mL Particia Nearing, [place]      PDMP not reviewed this encounter.   Particia Nearing, [place] 03/29/22 1816

## 2022-04-02 ENCOUNTER — Telehealth: Payer: Self-pay | Admitting: Internal Medicine

## 2022-04-02 NOTE — Telephone Encounter (Signed)
PT spouse calls today in regards to this referral as well as recent blood sugar readings. I informed PT that there doesn't seem to be an update yet on their referral. PT is requesting referral to check on neuropathy of both of PT's feet as this has not been done yet.   PT's spouse also stated that PT has had high blood sugar readings for the past week. When asking PT's spouse for exact numbers they stated that the reader just indicated them as "high". PT is not currently on any glipizide's.   CB: [phone]

## 2022-04-02 NOTE — Telephone Encounter (Signed)
PT calls today in regards to possible TOC. PT's spouse is currently established with Jeralyn Ruths and they were wanting to both be under the same provider. I informed PT that there would need to be approval from both providers before proceeding.

## 2022-04-18 ENCOUNTER — Ambulatory Visit: Payer: 59 | Admitting: Family Medicine

## 2022-04-18 ENCOUNTER — Other Ambulatory Visit: Payer: Self-pay

## 2022-04-23 ENCOUNTER — Other Ambulatory Visit: Payer: Self-pay

## 2022-04-25 ENCOUNTER — Other Ambulatory Visit: Payer: Self-pay

## 2022-04-25 ENCOUNTER — Encounter: Payer: Self-pay | Admitting: Family Medicine

## 2022-04-25 ENCOUNTER — Other Ambulatory Visit: Payer: Self-pay | Admitting: Family Medicine

## 2022-04-25 ENCOUNTER — Ambulatory Visit (INDEPENDENT_AMBULATORY_CARE_PROVIDER_SITE_OTHER): Payer: 59 | Admitting: Family Medicine

## 2022-04-25 VITALS — BP 114/82 | HR 90 | Temp 97.6°F | Ht 74.0 in | Wt 272.0 lb

## 2022-04-25 DIAGNOSIS — E1165 Type 2 diabetes mellitus with hyperglycemia: Secondary | ICD-10-CM

## 2022-04-25 DIAGNOSIS — R5383 Other fatigue: Secondary | ICD-10-CM | POA: Diagnosis not present

## 2022-04-25 DIAGNOSIS — E559 Vitamin D deficiency, unspecified: Secondary | ICD-10-CM

## 2022-04-25 LAB — CBC WITH DIFFERENTIAL/PLATELET
Basophils Absolute: 0 10*3/uL (ref 0.0–0.1)
Basophils Relative: 0.5 % (ref 0.0–3.0)
Eosinophils Absolute: 0.1 10*3/uL (ref 0.0–0.7)
Eosinophils Relative: 1.8 % (ref 0.0–5.0)
HCT: 40.3 % (ref 39.0–52.0)
Hemoglobin: 13.5 g/dL (ref 13.0–17.0)
Lymphocytes Relative: 27.9 % (ref 12.0–46.0)
Lymphs Abs: 1.6 10*3/uL (ref 0.7–4.0)
MCHC: 33.5 g/dL (ref 30.0–36.0)
MCV: 83.5 fl (ref 78.0–100.0)
Monocytes Absolute: 0.3 10*3/uL (ref 0.1–1.0)
Monocytes Relative: 5.1 % (ref 3.0–12.0)
Neutro Abs: 3.8 10*3/uL (ref 1.4–7.7)
Neutrophils Relative %: 64.7 % (ref 43.0–77.0)
Platelets: 212 10*3/uL (ref 150.0–400.0)
RBC: 4.82 Mil/uL (ref 4.22–5.81)
RDW: 13.1 % (ref 11.5–15.5)
WBC: 5.9 10*3/uL (ref 4.0–10.5)

## 2022-04-25 LAB — VITAMIN B12: Vitamin B-12: 142 pg/mL — ABNORMAL LOW (ref 211–911)

## 2022-04-25 LAB — BASIC METABOLIC PANEL
BUN: 19 mg/dL (ref 6–23)
CO2: 28 mEq/L (ref 19–32)
Calcium: 8.8 mg/dL (ref 8.4–10.5)
Chloride: 98 mEq/L (ref 96–112)
Creatinine, Ser: 1.13 mg/dL (ref 0.40–1.50)
GFR: 76.2 mL/min (ref >60.00)
Glucose, Bld: 418 mg/dL — ABNORMAL HIGH (ref 70–99)
Potassium: 4.2 mEq/L (ref 3.5–5.1)
Sodium: 133 mEq/L — ABNORMAL LOW (ref 135–145)

## 2022-04-25 LAB — POCT URINALYSIS DIPSTICK
Bilirubin, UA: NEGATIVE
Blood, UA: NEGATIVE
Glucose, UA: POSITIVE — AB
Ketones, UA: NEGATIVE
Leukocytes, UA: NEGATIVE
Nitrite, UA: NEGATIVE
Protein, UA: NEGATIVE
Spec Grav, UA: 1.02 (ref 1.010–1.025)
Urobilinogen, UA: 0.2 E.U./dL
pH, UA: 5.5 (ref 5.0–8.0)

## 2022-04-25 LAB — VITAMIN D 25 HYDROXY (VIT D DEFICIENCY, FRACTURES): VITD: 14.9 ng/mL — ABNORMAL LOW (ref 30.00–100.00)

## 2022-04-25 LAB — TSH: TSH: 1.86 u[IU]/mL (ref 0.35–5.50)

## 2022-04-25 MED ORDER — GLIPIZIDE ER 10 MG PO TB24
10.0000 mg | ORAL_TABLET | Freq: Every day | ORAL | 0 refills | Status: DC
  Filled 2022-04-25 – 2022-05-21 (×2): qty 30, 30d supply, fill #0

## 2022-04-25 MED ORDER — VITAMIN D (ERGOCALCIFEROL) 1.25 MG (50000 UNIT) PO CAPS
50000.0000 [IU] | ORAL_CAPSULE | ORAL | 0 refills | Status: DC
  Filled 2022-04-25 – 2022-05-21 (×2): qty 4, 28d supply, fill #0

## 2022-04-25 NOTE — Progress Notes (Signed)
Subjective:     Patient ID: Micheal Neal, male    DOB: 09-Aug-1970, 52 y.o.   MRN: [id number]  Chief Complaint  Patient presents with   Hyperglycemia    Blood sugars in the 400s, doesn't seem like insulin is working. Was using glypicide and that was working but that was stopped 01/21/22    Hyperglycemia   Patient is in today for hyperglycemia and fatigue. BS in the 400 range.  He checks his BS with Dexcom.   States he is using mealtimes insulin sliding scale and Basaglar 30 units daily.   He was not able to pick up Trulicity or Ozempic as prescribed by PCP.   He would like to restart glipizide. This was previously stopped   C/o dry mouth, increased thirst and urinary frequency.   Denies fever, chills, dizziness, chest pain, palpitations, shortness of breath, abdominal pain, N/V/D.   He has an appt with endocrinology in March.      Health Maintenance Due  Topic Date Due   COVID-19 Vaccine (1) Never done   Hepatitis C Screening  Never done   COLONOSCOPY (Pts 45-30yrs Insurance coverage will need to be confirmed)  Never done   OPHTHALMOLOGY EXAM  09/26/2021    Past Medical History:  Diagnosis Date   Abnormal EKG    Contusion of left foot    Diabetes mellitus without complication (HCC)    Dizziness    Near syncope    Palpitations    Vertigo     Past Surgical History:  Procedure Laterality Date   NO PAST SURGERIES      Family History  Problem Relation Age of Onset   Heart disease Mother    Breast cancer Maternal Grandmother        diagnosed late 10's   Breast cancer Maternal Aunt     Social History   Socioeconomic History   Marital status: Married    Spouse name: Not on file   Number of children: 4   Years of education: Not on file   Highest education level: Not on file  Occupational History   Occupation: Maintenance Tech  Tobacco Use   Smoking status: Never   Smokeless tobacco: Never  Vaping Use   Vaping Use: Never used  Substance and Sexual  Activity   Alcohol use: No   Drug use: No   Sexual activity: Yes    Partners: Female  Other Topics Concern   Not on file  Social History Narrative   Not on file   Social Determinants of Health   Financial Resource Strain: Not on file  Food Insecurity: Not on file  Transportation Needs: Not on file  Physical Activity: Not on file  Stress: Not on file  Social Connections: Not on file  Intimate Partner Violence: Not on file    Outpatient Medications Prior to Visit  Medication Sig Dispense Refill   aspirin EC 81 MG tablet Take 1 tablet (81 mg total) by mouth daily. Swallow whole. 90 tablet 1   chlorhexidine (PERIDEX) 0.12 % solution Use as directed 15 mLs in the mouth or throat 2 (two) times daily. 120 mL 0   Continuous Blood Gluc Receiver (DEXCOM G6 RECEIVER) DEVI Use to check blood sugars 3x daily. DX: E11.40 1 each 5   Continuous Blood Gluc Sensor (DEXCOM G6 SENSOR) MISC use as directed daily 3 each 4   Continuous Blood Gluc Transmit (DEXCOM G6 TRANSMITTER) MISC Use to check blood sugars 3 times a day as directed 1  each 3   insulin aspart (NOVOLOG FLEXPEN) 100 UNIT/ML FlexPen Inject 10 Units into the skin 3 (three) times daily with meals. 27 mL 0   Insulin Glargine (BASAGLAR KWIKPEN) 100 UNIT/ML INJECT 30 UNITS INTO THE SKIN AT BEDTIME 27 mL 1   Insulin Pen Needle (TRUEPLUS PEN NEEDLES) 31G X 5 MM MISC USE AS DIRECTED FOUR TIMES DAILY 100 each 3   omega-3 acid ethyl esters (LOVAZA) 1 g capsule Take 2 capsules (2 g total) by mouth 2 (two) times daily. 360 capsule 1   rosuvastatin (CRESTOR) 20 MG tablet Take 1 tablet (20 mg total) by mouth daily. 90 tablet 1   amoxicillin-clavulanate (AUGMENTIN) 875-125 MG tablet Take 1 tablet by mouth every 12 (twelve) hours. (Patient not taking: Reported on 04/25/2022) 14 tablet 0   No facility-administered medications prior to visit.    No Known Allergies  ROS     Objective:    Physical Exam Constitutional:      General: He is not in  acute distress.    Appearance: He is not ill-appearing or diaphoretic.  Eyes:     Conjunctiva/sclera: Conjunctivae normal.  Cardiovascular:     Rate and Rhythm: Normal rate and regular rhythm.  Pulmonary:     Effort: Pulmonary effort is normal.     Breath sounds: Normal breath sounds.  Musculoskeletal:     Cervical back: Normal range of motion.  Skin:    General: Skin is warm and dry.  Neurological:     General: No focal deficit present.     Mental Status: He is alert and oriented to person, place, and time.  Psychiatric:        Mood and Affect: Mood normal.        Behavior: Behavior normal.        Thought Content: Thought content normal.     BP 114/82 (BP Location: Right Arm, Patient Position: Sitting, Cuff Size: Large)   Pulse 90   Temp 97.6 F (36.4 C) (Temporal)   Ht 6\' 2"  (1.88 m)   Wt 272 lb (123.4 kg)   SpO2 99%   BMI 34.92 kg/m  Wt Readings from Last 3 Encounters:  04/25/22 272 lb (123.4 kg)  02/25/22 274 lb 9.6 oz (124.6 kg)  01/25/22 271 lb 6.4 oz (123.1 kg)       Assessment & Plan:   Problem List Items Addressed This Visit       Endocrine   DM (diabetes mellitus), type 2, uncontrolled - Primary   Relevant Medications   glipiZIDE (GLUCOTROL XL) 10 MG 24 hr tablet   Other Relevant Orders   Basic metabolic panel   TSH   CBC with Differential/Platelet   POCT Urinalysis Dipstick (Completed)   Other Visit Diagnoses     Fatigue, unspecified type       Relevant Orders   Basic metabolic panel   TSH   VITAMIN D 25 Hydroxy (Vit-D Deficiency, Fractures)   Vitamin B12   CBC with Differential/Platelet   POCT Urinalysis Dipstick (Completed)      Hyperglycemia and symptomatic, nontoxic appearing.  Urinalysis dipstick negative for ketones.  He is currently taking NovoLog 3 times daily with meals,sliding scale,  and Basaglar 30 units daily.  He was unable to obtain Ozempic or Trulicity.   Restart glipizide XR 10 mg per patient request and consultation with  Dr. Ronnald Ramp.  I will also increase Basaglar to 35 units daily.  He is well aware of the need to closely monitor blood  sugars.  He is using a Dexcom. He has an appointment with endocrinology in March. Follow-up pending lab results from today.  Discussed that his fatigue is most likely due to uncontrolled diabetes.  I have discontinued [facility]s amoxicillin-clavulanate. I am also having him start on glipiZIDE. Additionally, I am having him maintain his omega-3 acid ethyl esters, Dexcom G6 Transmitter, rosuvastatin, Dexcom G6 Receiver, Basaglar KwikPen, TRUEplus Pen Needles, Dexcom G6 Sensor, NovoLOG FlexPen, aspirin EC, and chlorhexidine.  Meds ordered this encounter  Medications   glipiZIDE (GLUCOTROL XL) 10 MG 24 hr tablet    Sig: Take 1 tablet (10 mg total) by mouth daily with breakfast.    Dispense:  90 tablet    Refill:  0    Order Specific Question:   Supervising Provider    Answer:   Hillard Danker A [[id number]]

## 2022-04-25 NOTE — Patient Instructions (Addendum)
Please go downstairs for labs before you leave.   Started back on glipizide once daily with breakfast.  You may increase your Basaglar insulin from 30 to 35 units daily.  Follow-up with Dr. Ronnald Ramp if needed for your diabetes and see the endocrinologist as scheduled in March.

## 2022-04-25 NOTE — Progress Notes (Signed)
Please call him and let him know the following: Your vitamin D and vitamin B12 levels are both quite low.  I will send in a prescription vitamin D for you to take once weekly for the next 12 weeks.  Once you complete the prescription then you can start taking over-the-counter vitamin D3 1000 IUs daily.  We will call you to schedule vitamin B12 injections in our office.  He will need once weekly B12 injections x 4 weeks and then he can start on over-the-counter vitamin B12 1000 mcg daily.  His blood sugar is 418.  We addressed this today by starting on glipizide and increasing Basaglar.

## 2022-05-01 ENCOUNTER — Other Ambulatory Visit: Payer: Self-pay

## 2022-05-01 ENCOUNTER — Ambulatory Visit (INDEPENDENT_AMBULATORY_CARE_PROVIDER_SITE_OTHER): Payer: 59 | Admitting: *Deleted

## 2022-05-01 DIAGNOSIS — E538 Deficiency of other specified B group vitamins: Secondary | ICD-10-CM | POA: Diagnosis not present

## 2022-05-01 MED ORDER — CYANOCOBALAMIN 1000 MCG/ML IJ SOLN
1000.0000 ug | Freq: Once | INTRAMUSCULAR | Status: AC
  Administered 2022-05-01: 1000 ug via INTRAMUSCULAR

## 2022-05-01 NOTE — Progress Notes (Signed)
Pls cosign for B12 inj../lmb  

## 2022-05-06 ENCOUNTER — Other Ambulatory Visit: Payer: Self-pay

## 2022-05-08 ENCOUNTER — Ambulatory Visit (INDEPENDENT_AMBULATORY_CARE_PROVIDER_SITE_OTHER): Payer: 59 | Admitting: *Deleted

## 2022-05-08 DIAGNOSIS — E538 Deficiency of other specified B group vitamins: Secondary | ICD-10-CM

## 2022-05-08 MED ORDER — CYANOCOBALAMIN 1000 MCG/ML IJ SOLN
1000.0000 ug | Freq: Once | INTRAMUSCULAR | Status: AC
  Administered 2022-05-08: 1000 ug via INTRAMUSCULAR

## 2022-05-08 NOTE — Progress Notes (Signed)
Pls cosign for B12 inj../lmb  

## 2022-05-15 ENCOUNTER — Ambulatory Visit (INDEPENDENT_AMBULATORY_CARE_PROVIDER_SITE_OTHER): Payer: 59 | Admitting: *Deleted

## 2022-05-15 DIAGNOSIS — E538 Deficiency of other specified B group vitamins: Secondary | ICD-10-CM | POA: Diagnosis not present

## 2022-05-15 MED ORDER — CYANOCOBALAMIN 1000 MCG/ML IJ SOLN
1000.0000 ug | Freq: Once | INTRAMUSCULAR | Status: AC
  Administered 2022-05-15: 1000 ug via INTRAMUSCULAR

## 2022-05-15 NOTE — Progress Notes (Signed)
Pls cosign for B12 inj../lmb  

## 2022-05-19 ENCOUNTER — Encounter (HOSPITAL_COMMUNITY): Payer: Self-pay

## 2022-05-19 ENCOUNTER — Emergency Department (HOSPITAL_COMMUNITY): Payer: 59

## 2022-05-19 ENCOUNTER — Other Ambulatory Visit: Payer: Self-pay

## 2022-05-19 ENCOUNTER — Inpatient Hospital Stay (HOSPITAL_COMMUNITY)
Admission: EM | Admit: 2022-05-19 | Discharge: 2022-05-21 | DRG: 322 | Disposition: A | Discharge status: Home / Self Care | Payer: 59 | Attending: Student | Admitting: Student

## 2022-05-19 DIAGNOSIS — Z79899 Other long term (current) drug therapy: Secondary | ICD-10-CM

## 2022-05-19 DIAGNOSIS — R55 Syncope and collapse: Secondary | ICD-10-CM | POA: Diagnosis not present

## 2022-05-19 DIAGNOSIS — I10 Essential (primary) hypertension: Secondary | ICD-10-CM | POA: Diagnosis present

## 2022-05-19 DIAGNOSIS — I959 Hypotension, unspecified: Secondary | ICD-10-CM | POA: Diagnosis not present

## 2022-05-19 DIAGNOSIS — R9431 Abnormal electrocardiogram [ECG] [EKG]: Secondary | ICD-10-CM | POA: Diagnosis present

## 2022-05-19 DIAGNOSIS — I2510 Atherosclerotic heart disease of native coronary artery without angina pectoris: Secondary | ICD-10-CM | POA: Diagnosis not present

## 2022-05-19 DIAGNOSIS — I214 Non-ST elevation (NSTEMI) myocardial infarction: Principal | ICD-10-CM | POA: Diagnosis present

## 2022-05-19 DIAGNOSIS — Z8249 Family history of ischemic heart disease and other diseases of the circulatory system: Secondary | ICD-10-CM | POA: Diagnosis not present

## 2022-05-19 DIAGNOSIS — Z6834 Body mass index (BMI) 34.0-34.9, adult: Secondary | ICD-10-CM

## 2022-05-19 DIAGNOSIS — R739 Hyperglycemia, unspecified: Secondary | ICD-10-CM | POA: Diagnosis not present

## 2022-05-19 DIAGNOSIS — N179 Acute kidney failure, unspecified: Secondary | ICD-10-CM | POA: Diagnosis present

## 2022-05-19 DIAGNOSIS — Z7984 Long term (current) use of oral hypoglycemic drugs: Secondary | ICD-10-CM

## 2022-05-19 DIAGNOSIS — Z7982 Long term (current) use of aspirin: Secondary | ICD-10-CM

## 2022-05-19 DIAGNOSIS — Z955 Presence of coronary angioplasty implant and graft: Secondary | ICD-10-CM

## 2022-05-19 DIAGNOSIS — Z743 Need for continuous supervision: Secondary | ICD-10-CM | POA: Diagnosis not present

## 2022-05-19 DIAGNOSIS — E860 Dehydration: Secondary | ICD-10-CM | POA: Diagnosis present

## 2022-05-19 DIAGNOSIS — Z794 Long term (current) use of insulin: Secondary | ICD-10-CM | POA: Diagnosis not present

## 2022-05-19 DIAGNOSIS — E1165 Type 2 diabetes mellitus with hyperglycemia: Secondary | ICD-10-CM | POA: Diagnosis not present

## 2022-05-19 DIAGNOSIS — Z803 Family history of malignant neoplasm of breast: Secondary | ICD-10-CM | POA: Diagnosis not present

## 2022-05-19 DIAGNOSIS — R7989 Other specified abnormal findings of blood chemistry: Secondary | ICD-10-CM | POA: Diagnosis present

## 2022-05-19 DIAGNOSIS — I951 Orthostatic hypotension: Secondary | ICD-10-CM | POA: Diagnosis present

## 2022-05-19 DIAGNOSIS — R400 Somnolence: Secondary | ICD-10-CM | POA: Diagnosis not present

## 2022-05-19 DIAGNOSIS — E785 Hyperlipidemia, unspecified: Secondary | ICD-10-CM | POA: Diagnosis present

## 2022-05-19 DIAGNOSIS — R42 Dizziness and giddiness: Secondary | ICD-10-CM | POA: Diagnosis not present

## 2022-05-19 DIAGNOSIS — E1143 Type 2 diabetes mellitus with diabetic autonomic (poly)neuropathy: Secondary | ICD-10-CM | POA: Diagnosis not present

## 2022-05-19 DIAGNOSIS — R0789 Other chest pain: Secondary | ICD-10-CM | POA: Diagnosis not present

## 2022-05-19 HISTORY — DX: Non-ST elevation (NSTEMI) myocardial infarction: I21.4

## 2022-05-19 HISTORY — DX: Syncope and collapse: R55

## 2022-05-19 LAB — COMPREHENSIVE METABOLIC PANEL
ALT: 27 U/L (ref 0–44)
AST: 27 U/L (ref 15–41)
Albumin: 4.2 g/dL (ref 3.5–5.0)
Alkaline Phosphatase: 110 U/L (ref 38–126)
Anion gap: 9 (ref 5–15)
BUN: 17 mg/dL (ref 6–20)
CO2: 23 mmol/L (ref 22–32)
Calcium: 9 mg/dL (ref 8.9–10.3)
Chloride: 93 mmol/L — ABNORMAL LOW (ref 98–111)
Creatinine, Ser: 1.51 mg/dL — ABNORMAL HIGH (ref 0.61–1.24)
GFR, Estimated: 56 mL/min — ABNORMAL LOW (ref >60)
Glucose, Bld: 528 mg/dL (ref 70–99)
Potassium: 4.5 mmol/L (ref 3.5–5.1)
Sodium: 125 mmol/L — ABNORMAL LOW (ref 135–145)
Total Bilirubin: 0.8 mg/dL (ref 0.3–1.2)
Total Protein: 8.8 g/dL — ABNORMAL HIGH (ref 6.5–8.1)

## 2022-05-19 LAB — CBG MONITORING, ED
Glucose-Capillary: 581 mg/dL (ref 70–99)
Glucose-Capillary: 282 mg/dL — ABNORMAL HIGH (ref 70–99)
Glucose-Capillary: 215 mg/dL — ABNORMAL HIGH (ref 70–99)

## 2022-05-19 LAB — BLOOD GAS, VENOUS
Acid-Base Excess: 1 mmol/L (ref 0.0–2.0)
Bicarbonate: 26.6 mmol/L (ref 20.0–28.0)
Drawn by: 65574
O2 Saturation: 51.9 %
Patient temperature: 36.5
pCO2, Ven: 44 mmHg (ref 44–60)
pH, Ven: 7.39 (ref 7.25–7.43)
pO2, Ven: 31 mmHg — CL (ref 32–45)

## 2022-05-19 LAB — URINALYSIS, ROUTINE W REFLEX MICROSCOPIC
Bilirubin Urine: NEGATIVE
Glucose, UA: >=500 mg/dL — AB
Hgb urine dipstick: NEGATIVE
Ketones, ur: NEGATIVE mg/dL
Leukocytes,Ua: NEGATIVE
Nitrite: NEGATIVE
Protein, ur: NEGATIVE mg/dL
Specific Gravity, Urine: 1.022 (ref 1.005–1.030)
pH: 5 (ref 5.0–8.0)

## 2022-05-19 LAB — CBC WITH DIFFERENTIAL/PLATELET
Abs Immature Granulocytes: 0.04 10*3/uL (ref 0.00–0.07)
Basophils Absolute: 0 10*3/uL (ref 0.0–0.1)
Basophils Relative: 0 %
Eosinophils Absolute: 0.1 10*3/uL (ref 0.0–0.5)
Eosinophils Relative: 1 %
HCT: 41.1 % (ref 39.0–52.0)
Hemoglobin: 14.1 g/dL (ref 13.0–17.0)
Immature Granulocytes: 1 %
Lymphocytes Relative: 17 %
Lymphs Abs: 1.3 10*3/uL (ref 0.7–4.0)
MCH: 28.1 pg (ref 26.0–34.0)
MCHC: 34.3 g/dL (ref 30.0–36.0)
MCV: 81.9 fL (ref 80.0–100.0)
Monocytes Absolute: 0.3 10*3/uL (ref 0.1–1.0)
Monocytes Relative: 4 %
Neutro Abs: 5.9 10*3/uL (ref 1.7–7.7)
Neutrophils Relative %: 77 %
Platelets: 227 10*3/uL (ref 150–400)
RBC: 5.02 MIL/uL (ref 4.22–5.81)
RDW: 12.2 % (ref 11.5–15.5)
WBC: 7.7 10*3/uL (ref 4.0–10.5)
nRBC: 0 % (ref 0.0–0.2)

## 2022-05-19 LAB — LACTIC ACID, PLASMA
Lactic Acid, Venous: 2.1 mmol/L (ref 0.5–1.9)
Lactic Acid, Venous: 1.5 mmol/L (ref 0.5–1.9)

## 2022-05-19 LAB — TROPONIN I (HIGH SENSITIVITY)
Troponin I (High Sensitivity): 68 ng/L — ABNORMAL HIGH (ref <18)
Troponin I (High Sensitivity): 274 ng/L (ref <18)

## 2022-05-19 MED ORDER — SODIUM CHLORIDE 0.9 % IV BOLUS
500.0000 mL | Freq: Once | INTRAVENOUS | Status: AC
  Administered 2022-05-19: 500 mL via INTRAVENOUS

## 2022-05-19 MED ORDER — ONDANSETRON HCL 4 MG PO TABS: 4.0000 mg | ORAL_TABLET | Freq: Four times a day (QID) | ORAL | Status: DC | PRN

## 2022-05-19 MED ORDER — POLYETHYLENE GLYCOL 3350 17 G PO PACK: 17.0000 g | PACK | Freq: Every day | ORAL | Status: DC | PRN

## 2022-05-19 MED ORDER — ONDANSETRON HCL 4 MG/2ML IJ SOLN: 4.0000 mg | Freq: Four times a day (QID) | INTRAMUSCULAR | Status: DC | PRN

## 2022-05-19 MED ORDER — ASPIRIN 81 MG PO TBEC
81.0000 mg | DELAYED_RELEASE_TABLET | Freq: Every day | ORAL | Status: DC
  Administered 2022-05-20 – 2022-05-21 (×2): 81 mg via ORAL
  Filled 2022-05-19 (×2): qty 1

## 2022-05-19 MED ORDER — ACETAMINOPHEN 325 MG PO TABS: 650.0000 mg | ORAL_TABLET | Freq: Four times a day (QID) | ORAL | Status: DC | PRN

## 2022-05-19 MED ORDER — HEPARIN (PORCINE) 25000 UT/250ML-% IV SOLN
1600.0000 [IU]/h | INTRAVENOUS | Status: DC
  Administered 2022-05-19: 1600 [IU]/h via INTRAVENOUS
  Filled 2022-05-19 (×2): qty 250

## 2022-05-19 MED ORDER — ROSUVASTATIN CALCIUM 20 MG PO TABS
20.0000 mg | ORAL_TABLET | Freq: Every day | ORAL | Status: DC
  Administered 2022-05-20: 20 mg via ORAL
  Filled 2022-05-19: qty 1

## 2022-05-19 MED ORDER — INSULIN ASPART 100 UNIT/ML IJ SOLN
0.0000 [IU] | INTRAMUSCULAR | Status: DC
  Administered 2022-05-19 – 2022-05-20 (×3): 5 [IU] via SUBCUTANEOUS
  Filled 2022-05-19: qty 1

## 2022-05-19 MED ORDER — INSULIN ASPART 100 UNIT/ML IJ SOLN
20.0000 [IU] | Freq: Once | INTRAMUSCULAR | Status: AC
  Administered 2022-05-19: 20 [IU] via SUBCUTANEOUS
  Filled 2022-05-19: qty 1

## 2022-05-19 MED ORDER — ACETAMINOPHEN 650 MG RE SUPP: 650.0000 mg | Freq: Four times a day (QID) | RECTAL | Status: DC | PRN

## 2022-05-19 MED ORDER — SODIUM CHLORIDE 0.9 % IV SOLN: INTRAVENOUS | Status: AC

## 2022-05-19 MED ORDER — HEPARIN BOLUS VIA INFUSION
4000.0000 [IU] | Freq: Once | INTRAVENOUS | Status: AC
  Administered 2022-05-19: 4000 [IU] via INTRAVENOUS

## 2022-05-19 MED ORDER — ASPIRIN 325 MG PO TABS
325.0000 mg | ORAL_TABLET | Freq: Once | ORAL | Status: AC
  Administered 2022-05-19: 325 mg via ORAL
  Filled 2022-05-19: qty 1

## 2022-05-19 NOTE — ED Triage Notes (Signed)
Pt c/o cbg reading high, syncopal episode at home with dizziness,

## 2022-05-19 NOTE — Assessment & Plan Note (Addendum)
Troponin 68 > 274.  EKG showing initial ST elevations in inferior leads- II, III, AVF, resolved on subsequent EKG.  He denies chest pain or dyspnea, presenting with 2 episodes of syncope.  Denies tobacco abuse.  No known family history of coronary artery disease.  -Trend troponin - EDP talked to cardiologist, plan per above -Care order instruction to page cardiology on arrival at [facility] -Aspirin, statins

## 2022-05-19 NOTE — ED Provider Notes (Signed)
Patient had syncopal episode and possibly 2 syncopal episodes.  His wife states that he had some shortness of breath when he awoke and was sweaty.  Patient has 2 troponins that are elevated.  Initial EKG showed possibly some minor ST elevation inferiorly.  This is improved on the second EKG.  I spoke with Dr. Patsey Berthold cardiology and he wants the patient placed on heparin and admitted to [facility] by the hospitalist with cardiology consult.  Patient has not had any chest discomfort in the emergency department   Milton Ferguson, MD 05/19/22 1844

## 2022-05-19 NOTE — Assessment & Plan Note (Addendum)
Uncontrolled diabetes.  Glucose 528 > 282 s/p 20 units of insulin, 1 L bolus..  Reports baseline in the 300s.   Normal Anion gap- 9, serum bicarb 23, VBG shows pH of 7.39. - HgbA1C - SSi- M q4h -Hold home Lantus, glipizide for now while n.p.o. -Hydrate

## 2022-05-19 NOTE — H&P (Signed)
History and Physical    Kiley Overdorf [id number] DOB: Sep 22, 1970 DOA: 05/19/2022  PCP: [name], MD   Patient coming from: Home  I have personally briefly reviewed patient's old medical records in [place]  Chief Complaint: Syncope  HPI: Micheal Neal is a 52 y.o. male with medical history significant for diabetes mellitus, with neuropathy and autonomic dysfunction, hypertension. Patient presented to the ED with reports of 2 syncopal episodes today.  Patient was painting, when suddenly he felt dizzy, he tried to get down but did not quite make it before he passed out.  Spouse reports patient came to, then passed out again.  Patient denies chest pain.  No difficulty breathing.  Denies tobacco abuse.  Denies known family history of heart attacks (had open heart surgery for "enlarged heart").   Patient has a history of dizziness, for which he sees cardiology, thought secondary to orthostatic hypotension, usually he is able to sit down and then dizziness resolves. Patient also reports mild redness to his left eye with some mild pain earlier today which has improved.  Reports he usually has this up to 4 times a year.  Blood sugars run in the 300s, his Lantus was recently increased from 30 to 35 units he has not started this new dose.  He reports increased urination and thirst.  No vomiting no loose stools.  ED Course: Stable vitals. Troponin 68 > 274. Initial EKG showed ST elevations in inferior leads II, III, aVF which resolved on subsequent EKG. Blood glucose 528-20 units of NovoLog 1 L bolus given. EDP talked to Dr. Patsey Berthold cardiologist on-call, recommended admission to [facility], start heparin drip.  Review of Systems: As per HPI all other systems reviewed and negative.  Past Medical History:  Diagnosis Date   Abnormal EKG    Contusion of left foot    Diabetes mellitus without complication (HCC)    Dizziness    Near syncope    Palpitations    Vertigo     Past  Surgical History:  Procedure Laterality Date   NO PAST SURGERIES       reports that he has never smoked. He has never used smokeless tobacco. He reports that he does not drink alcohol and does not use drugs.  No Known Allergies  Family History  Problem Relation Age of Onset   Heart disease Mother    Breast cancer Maternal Grandmother        diagnosed late 33's   Breast cancer Maternal Aunt      Prior to Admission medications   Medication Sig Start Date End Date Taking? Authorizing Provider  Vitamin D, Ergocalciferol, (DRISDOL) 1.25 MG (50000 UNIT) CAPS capsule Take 1 capsule (50,000 Units total) by mouth every 7 (seven) days. 04/25/22   Henson, Vickie L, NP-C  aspirin EC 81 MG tablet Take 1 tablet (81 mg total) by mouth daily. Swallow whole. 02/05/22   [name], MD  chlorhexidine (PERIDEX) 0.12 % solution Use as directed 15 mLs in the mouth or throat 2 (two) times daily. 03/29/22   Volney American, PA-C  Continuous Blood Gluc Receiver (DEXCOM G6 RECEIVER) DEVI Use to check blood sugars 3x daily. DX: E11.40 11/08/21   [name], MD  Continuous Blood Gluc Sensor (DEXCOM G6 SENSOR) MISC use as directed daily 11/07/21   [name], MD  Continuous Blood Gluc Transmit (DEXCOM G6 TRANSMITTER) MISC Use to check blood sugars 3 times a day as directed 11/07/21   Ronnald Ramp,  Arvid Right, MD  glipiZIDE (GLUCOTROL XL) 10 MG 24 hr tablet Take 1 tablet (10 mg total) by mouth daily with breakfast. 04/25/22   Henson, Vickie L, NP-C  insulin aspart (NOVOLOG FLEXPEN) 100 UNIT/ML FlexPen Inject 10 Units into the skin 3 (three) times daily with meals. 12/14/21   [name], MD  Insulin Glargine [facility]) 100 UNIT/ML INJECT 30 UNITS INTO THE SKIN AT BEDTIME 11/07/21   [name], MD  Insulin Pen Needle (TRUEPLUS PEN NEEDLES) 31G X 5 MM MISC USE AS DIRECTED FOUR TIMES DAILY 11/22/21   [name], MD  omega-3 acid ethyl esters (LOVAZA) 1 g capsule Take 2 capsules (2 g total) by  mouth 2 (two) times daily. 11/07/21   [name], MD  rosuvastatin (CRESTOR) 20 MG tablet Take 1 tablet (20 mg total) by mouth daily. 11/07/21   [name], MD    Physical Exam: Vitals:   05/19/22 1430 05/19/22 1519 05/19/22 1630  BP: (!) 151/107    Pulse: 100    Resp: 14    Temp:  97.7 F (36.5 C)   TempSrc:  Oral   SpO2: 100%    Weight:   123.4 kg  Height:   6' 2"$  (1.88 m)    Constitutional: NAD, calm, comfortable Vitals:   05/19/22 1430 05/19/22 1519 05/19/22 1630  BP: (!) 151/107    Pulse: 100    Resp: 14    Temp:  97.7 F (36.5 C)   TempSrc:  Oral   SpO2: 100%    Weight:   123.4 kg  Height:   6' 2"$  (1.88 m)   Eyes: PERRL, lids and conjunctivae normal ENMT: Mucous membranes are moist.  Neck: normal, supple, no masses, no thyromegaly Respiratory: clear to auscultation bilaterally, no wheezing, no crackles. Normal respiratory effort. No accessory muscle use.  Cardiovascular: Regular rate and rhythm, no murmurs / rubs / gallops. No extremity edema.  Abdomen: no tenderness, no masses palpated. No hepatosplenomegaly. Bowel sounds positive.  Musculoskeletal: no clubbing / cyanosis. No joint deformity upper and lower extremities.  Skin: no rashes, lesions, ulcers. No induration Neurologic: No apparent cranial nerve abnormalities, moving extremities spontaneously.  Psychiatric: Normal judgment and insight. Alert and oriented x 3. Normal mood.   Labs on Admission: I have personally reviewed following labs and imaging studies  CBC: Recent Labs  Lab 05/19/22 1450  WBC 7.7  NEUTROABS 5.9  HGB 14.1  HCT 41.1  MCV 81.9  PLT [id number]   Basic Metabolic Panel: Recent Labs  Lab 05/19/22 1450  NA 125*  K 4.5  CL 93*  CO2 23  GLUCOSE 528*  BUN 17  CREATININE 1.51*  CALCIUM 9.0   GFR: Estimated Creatinine Clearance: 82.6 mL/min (A) (by C-G formula based on SCr of 1.51 mg/dL (H)). Liver Function Tests: Recent Labs  Lab 05/19/22 1450  AST 27  ALT 27  ALKPHOS  110  BILITOT 0.8  PROT 8.8*  ALBUMIN 4.2   CBG: Recent Labs  Lab 05/19/22 1423  GLUCAP 581*   Urine analysis:    Component Value Date/Time   COLORURINE STRAW (A) 05/19/2022 1626   APPEARANCEUR CLEAR 05/19/2022 1626   LABSPEC 1.022 05/19/2022 1626   PHURINE 5.0 05/19/2022 1626   GLUCOSEU >=500 (A) 05/19/2022 1626   GLUCOSEU >=1000 (A) 11/06/2021 1627   HGBUR NEGATIVE 05/19/2022 1626   BILIRUBINUR NEGATIVE 05/19/2022 1626   BILIRUBINUR negative 04/25/2022 [place] 05/19/2022 1626   PROTEINUR NEGATIVE 05/19/2022  1626   UROBILINOGEN 0.2 04/25/2022 0947   UROBILINOGEN 0.2 11/06/2021 1627   NITRITE NEGATIVE 05/19/2022 1626   LEUKOCYTESUR NEGATIVE 05/19/2022 1626    Radiological Exams on Admission: CT Head Wo Contrast  Result Date: 05/19/2022 CLINICAL DATA:  Altered level of consciousness, syncope, dizziness, hyperglycemia EXAM: CT HEAD WITHOUT CONTRAST TECHNIQUE: Contiguous axial images were obtained from the base of the skull through the vertex without intravenous contrast. RADIATION DOSE REDUCTION: This exam was performed according to the departmental dose-optimization program which includes automated exposure control, adjustment of the mA and/or kV according to patient size and/or use of iterative reconstruction technique. COMPARISON:  None Available. FINDINGS: Brain: No acute infarct or hemorrhage. Lateral ventricles and midline structures are unremarkable. No acute extra-axial fluid collections. No mass effect. Vascular: No hyperdense vessel or unexpected calcification. Skull: Normal. Negative for fracture or focal lesion. Sinuses/Orbits: Minimal polypoid mucosal thickening right maxillary sinus. Remaining paranasal sinuses are clear. Other: None. IMPRESSION: 1. No acute intracranial process. Electronically Signed   By: Randa Ngo M.D.   On: 05/19/2022 15:02   DG Chest Port 1 View  Result Date: 05/19/2022 CLINICAL DATA:  Syncope.  Dizziness. EXAM: PORTABLE  CHEST 1 VIEW COMPARISON:  11/18/2021. FINDINGS: Normal heart, mediastinum and hila. Clear lungs.  No pleural effusion or pneumothorax. Skeletal structures are grossly intact. IMPRESSION: No active disease. Electronically Signed   By: Lajean Manes M.D.   On: 05/19/2022 14:39    EKG: Independently reviewed.  Sinus rhythm, rate 95, QTc 424.  No significant ST or T wave changes from prior.  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  History of autonomic dysfunction, orthostatic hypotension, follows with cardiology.  Currently hyperglycemic, with polyuria and polydipsia.  Initial ST elevation in leads II, III, aVF, not present on subsequent EKG.  Troponin 68 > 274.  Head CT negative for acute abnormality.  Echo 01/2022, EF of 60 to 65%, mild LVH. - EDP talked to cardiologist Dr. Patsey Berthold, recommended admission to [facility], start heparin drip. -Aspirin 325 x 1 -Check orthostatic vitals-positive, drop in systolic 0000000 > 123456, diastolic 92 > 80, increase pulse 95 > 115 -1 Liter bolus given, continue N/s 100cc/hr x 20hrs  Elevated troponin Troponin 68 > 274.  EKG showing initial ST elevations in inferior leads- II, III, AVF, resolved on subsequent EKG.  He denies chest pain or dyspnea, presenting with 2 episodes of syncope.  Denies tobacco abuse.  No known family history of coronary artery disease.  -Trend troponin - EDP talked to cardiologist, plan per above -Care order instruction to page cardiology on arrival at [facility] -Aspirin, statins  Hyperglycemia Uncontrolled diabetes.  Glucose 528 > 282 s/p 20 units of insulin, 1 L bolus..  Reports baseline in the 300s.   Normal Anion gap- 9, serum bicarb 23, VBG shows pH of 7.39. - HgbA1C - SSi- M q4h -Hold home Lantus,  glipizide for now while n.p.o. -Hydrate  DVT prophylaxis: Heparin Code Status: Full Code Family Communication: Patient's son at bedside. Disposition Plan: ~ 2 days Consults called: Cardiology Admission status:  Obs tele    Author: Bethena Roys, MD 05/19/2022 8:21 PM  For on call review [email or web address].

## 2022-05-19 NOTE — Assessment & Plan Note (Addendum)
2 episodes today, history of dizziness, reports prior dizziness before fall.  History of autonomic dysfunction, orthostatic hypotension, follows with cardiology.  Currently hyperglycemic, with polyuria and polydipsia.  Initial ST elevation in leads II, III, aVF, not present on subsequent EKG.  Troponin 68 > 274.  Head CT negative for acute abnormality.  Echo 01/2022, EF of 60 to 65%, mild LVH. - EDP talked to cardiologist Dr. Patsey Berthold, recommended admission to [facility], start heparin drip. -Aspirin 325 x 1 -Check orthostatic vitals-positive, drop in systolic 0000000 > 123456, diastolic 92 > 80, increase pulse 95 > 115 -1 Liter bolus given, continue N/s 100cc/hr x 20hrs

## 2022-05-19 NOTE — Progress Notes (Signed)
ANTICOAGULATION CONSULT NOTE - Initial Consult  Pharmacy Consult for Heparin Indication: chest pain/ACS  No Known Allergies  Patient Measurements: Height: 6' 2"$  (188 cm) Weight: 123.4 kg (272 lb 0.8 oz) IBW/kg (Calculated) : 82.2 Heparin Dosing Weight: 110 kg  Vital Signs: Temp: 97.7 F (36.5 C) (02/18 1519) Temp Source: Oral (02/18 1519) BP: 151/107 (02/18 1430) Pulse Rate: 100 (02/18 1430)  Labs: Recent Labs    05/19/22 1450 05/19/22 1622  HGB 14.1  --   HCT 41.1  --   PLT 227  --   CREATININE 1.51*  --   TROPONINIHS 68* 274*    Estimated Creatinine Clearance: 82.6 mL/min (A) (by C-G formula based on SCr of 1.51 mg/dL (H)).   Medical History: Past Medical History:  Diagnosis Date   Abnormal EKG    Contusion of left foot    Diabetes mellitus without complication (HCC)    Dizziness    Near syncope    Palpitations    Vertigo     Medications:  Awaiting home med rec  Assessment: 52 y.o. M presents with hyperglycemia and 2 episodes of syncope. Trop 274. No AC PTA. CBC ok on admission. To begin heparin for ACS.  Goal of Therapy:  Heparin level 0.3-0.7 units/ml Monitor platelets by anticoagulation protocol: Yes   Plan:  Heparin IV bolus 4000 units Heparin gtt at 1600 units/hr Will f/u heparin level in 6 hours Daily heparin level and CBC  Sherlon Handing, PharmD, BCPS Please see amion for complete clinical pharmacist phone list 05/19/2022,6:28 PM

## 2022-05-19 NOTE — ED Provider Notes (Signed)
[facility] Provider Note   CSN: [id number] Arrival date & time: 05/19/22  1359     History {Add pertinent medical, surgical, social history, OB history to HPI:1} Chief Complaint  Patient presents with   Hyperglycemia    Erin Kellie is a 52 y.o. male.  HPI Is with his wife who assists with the history.  Patient notes a history of difficult to control diabetes in spite of multiple medications, including insulin.  He is scheduled to see endocrinologist for the second time next month.  He also has a history of orthostatic hypotension after standing for prolonged periods of time.  Today the patient had 2 episodes of syncope.  He notes that he was in his usual state of health this morning prior to the events had no chest pain before, nor following syncope.  Currently states he feels back to normal.  His wife feels as though he may have some mild swelling around his left eye, injection into the sclera as well.  Patient denies visual complaints.  No recent medication change, diet change.    Home Medications Prior to Admission medications   Medication Sig Start Date End Date Taking? Authorizing Provider  Vitamin D, Ergocalciferol, (DRISDOL) 1.25 MG (50000 UNIT) CAPS capsule Take 1 capsule (50,000 Units total) by mouth every 7 (seven) days. 04/25/22   Henson, Vickie L, NP-C  aspirin EC 81 MG tablet Take 1 tablet (81 mg total) by mouth daily. Swallow whole. 02/05/22   [name], MD  chlorhexidine (PERIDEX) 0.12 % solution Use as directed 15 mLs in the mouth or throat 2 (two) times daily. 03/29/22   Volney American, PA-C  Continuous Blood Gluc Receiver (DEXCOM G6 RECEIVER) DEVI Use to check blood sugars 3x daily. DX: E11.40 11/08/21   [name], MD  Continuous Blood Gluc Sensor (DEXCOM G6 SENSOR) MISC use as directed daily 11/07/21   [name], MD  Continuous Blood Gluc Transmit (DEXCOM G6 TRANSMITTER) MISC Use to check blood sugars  3 times a day as directed 11/07/21   [name], MD  glipiZIDE (GLUCOTROL XL) 10 MG 24 hr tablet Take 1 tablet (10 mg total) by mouth daily with breakfast. 04/25/22   Henson, Vickie L, NP-C  insulin aspart (NOVOLOG FLEXPEN) 100 UNIT/ML FlexPen Inject 10 Units into the skin 3 (three) times daily with meals. 12/14/21   [name], MD  Insulin Glargine [facility]) 100 UNIT/ML INJECT 30 UNITS INTO THE SKIN AT BEDTIME 11/07/21   [name], MD  Insulin Pen Needle (TRUEPLUS PEN NEEDLES) 31G X 5 MM MISC USE AS DIRECTED FOUR TIMES DAILY 11/22/21   [name], MD  omega-3 acid ethyl esters (LOVAZA) 1 g capsule Take 2 capsules (2 g total) by mouth 2 (two) times daily. 11/07/21   [name], MD  rosuvastatin (CRESTOR) 20 MG tablet Take 1 tablet (20 mg total) by mouth daily. 11/07/21   [name], MD      Allergies    Patient has no known allergies.    Review of Systems   Review of Systems  All other systems reviewed and are negative.   Physical Exam Updated Vital Signs There were no vitals taken for this visit. Physical Exam Vitals and nursing note reviewed.  Constitutional:      General: He is not in acute distress.    Appearance: He is well-developed.  HENT:     Head: Normocephalic and atraumatic.  Eyes:     Conjunctiva/sclera: Conjunctivae normal.  Cardiovascular:     Rate and Rhythm: Regular rhythm. Tachycardia present.  Pulmonary:     Effort: Pulmonary effort is normal. No respiratory distress.     Breath sounds: No stridor.  Abdominal:     General: There is no distension.  Skin:    General: Skin is warm and dry.  Neurological:     General: No focal deficit present.     Mental Status: He is alert and oriented to person, place, and time.     ED Results / Procedures / Treatments   Labs (all labs ordered are listed, but only abnormal results are displayed) Labs Reviewed  COMPREHENSIVE METABOLIC PANEL  LACTIC ACID, PLASMA  LACTIC ACID, PLASMA  CBC  WITH DIFFERENTIAL/PLATELET  URINALYSIS, ROUTINE W REFLEX MICROSCOPIC  BLOOD GAS, VENOUS  TROPONIN I (HIGH SENSITIVITY)    EKG None  Radiology No results found.  Procedures Procedures  {Document cardiac monitor, telemetry assessment procedure when appropriate:1}  Medications Ordered in ED Medications  sodium chloride 0.9 % bolus 500 mL (has no administration in time range)    ED Course/ Medical Decision Making/ A&P   {   Click here for ABCD2, HEART and other calculatorsREFRESH Note before signing :1}                          Medical Decision Making Patient presents after 2 episode of syncope in the context of ongoing efforts to control his diabetes with history of prior orthostatic hypotension.  Differential including arrhythmia, sympathetic dysfunction, dehydration, DKA, nonketotic hyperosmolar state, electrolyte abnormalities considered.  Patient placed on cardiac monitoring, fluids started.  Cardiac 95 sinus normal Pulse ox 99% room air normal   Amount and/or Complexity of Data Reviewed Independent Historian: spouse External Data Reviewed: notes.    Details: No recent imaging Labs: ordered. Decision-making details documented in ED Course. Radiology: ordered and independent interpretation performed. Decision-making details documented in ED Course. ECG/medicine tests: ordered and independent interpretation performed. Decision-making details documented in ED Course.  Risk Prescription drug management. Decision regarding hospitalization.   ***  {Document critical care time when appropriate:1} {Document review of labs and clinical decision tools ie heart score, Chads2Vasc2 etc:1}  {Document your independent review of radiology images, and any outside records:1} {Document your discussion with family members, caretakers, and with consultants:1} {Document social determinants of health affecting pt's care:1} {Document your decision making why or why not admission,  treatments were needed:1} Final Clinical Impression(s) / ED Diagnoses Final diagnoses:  None    Rx / DC Orders ED Discharge Orders     None

## 2022-05-19 NOTE — Consult Note (Signed)
Cardiology Consultation   Patient ID: Micheal Neal MRN: [id number]; DOB: [birth year]  Admit date: 05/19/2022 Date of Consult: 05/19/2022  PCP:  [name], MD   [facility] Providers Cardiologist:  Lauree Chandler, MD   { Click here to update MD or APP on Care Team, Refresh:1}     Patient Profile:   Micheal Neal is a 52 y.o. male with a hx of diabetes, hyperlipidemia, vertigo and hypotension who is being seen 05/19/2022 for the evaluation of syncope and possible ACS at the request of ER.  History of Present Illness:   Micheal Neal is known to have recurrent episodes of pre-syncope. He was seen in the cardiology office and had extensive workup which included normal stress test, normal echocardiogram and 14-d monitor with short runs of SVT. He had been found to be orthostatic in the past and this was determined to be the cause of his prior pre-syncopal events.  Today patient states that he started painting inside the house at 10 am and around 1 pm he felt sudden onset of dizziness, called his wife, tried to sit down and suddenly blacked out. Per wife he was not responding for approx 10 min. He did not have tonic/clonic movements, no eye deviation. Wife said that at some point he opened his eyes and was just staring but not following commands. After he regained consciousness he was diaphoretic and short of breath. Denies chest pain. These symptoms lasted for approx 20 min until EMS arrived. He has never had episodes like this in the past. In fact, he states that all his prior events were when changing positions from sitting to standing and never resulted on blacking out.  He denies palpiations prior or after.  Vitals snl on arrival. Labs were relevant for glucose of 581, Na 125 (133 corrected) and Cr of 1.51 (baseline 1.1). Trop 68 -> 274 No acute CP process of CXR ECG with inferior ST segment changes.  Past Medical History:  Diagnosis Date   Abnormal EKG     Contusion of left foot    Diabetes mellitus without complication (HCC)    Dizziness    Near syncope    Palpitations    Vertigo     Past Surgical History:  Procedure Laterality Date   NO PAST SURGERIES       {Home Medications (Optional):21181}  Inpatient Medications: Scheduled Meds:  [START ON 05/20/2022] aspirin EC  81 mg Oral Daily   insulin aspart  0-15 Units Subcutaneous Q4H   [START ON 05/20/2022] rosuvastatin  20 mg Oral Daily   Continuous Infusions:  sodium chloride 100 mL/hr at 05/19/22 2037   heparin 1,600 Units/hr (05/19/22 1857)   PRN Meds: acetaminophen **OR** acetaminophen, ondansetron **OR** ondansetron (ZOFRAN) IV, polyethylene glycol  Allergies:   No Known Allergies  Social History:   Social History   Socioeconomic History   Marital status: Married    Spouse name: Not on file   Number of children: 4   Years of education: Not on file   Highest education level: Not on file  Occupational History   Occupation: Maintenance Tech  Tobacco Use   Smoking status: Never   Smokeless tobacco: Never  Vaping Use   Vaping Use: Never used  Substance and Sexual Activity   Alcohol use: No   Drug use: No   Sexual activity: Yes    Partners: Female  Other Topics Concern   Not on file  Social History Narrative   Not on  file   Social Determinants of Health   Financial Resource Strain: Not on file  Food Insecurity: Not on file  Transportation Needs: Not on file  Physical Activity: Not on file  Stress: Not on file  Social Connections: Not on file  Intimate Partner Violence: Not on file    Family History:   *** Family History  Problem Relation Age of Onset   Heart disease Mother    Breast cancer Maternal Grandmother        diagnosed late 22's   Breast cancer Maternal Aunt      ROS:  Please see the history of present illness.  *** All other ROS reviewed and negative.     Physical Exam/Data:   Vitals:   05/19/22 1930 05/19/22 1949 05/19/22 2000  05/19/22 2030  BP: (!) 127/96  (!) 143/89 120/82  Pulse: 87  88 92  Resp: (!) 22  14 13  $ Temp:  98.6 F (37 C)  98.6 F (37 C)  TempSrc:  Oral  Oral  SpO2: 97%  100% 100%  Weight:      Height:       No intake or output data in the 24 hours ending 05/19/22 2227    05/19/2022    4:30 PM 04/25/2022    9:09 AM 02/25/2022    9:10 AM  Last 3 Weights  Weight (lbs) 272 lb 0.8 oz 272 lb 274 lb 9.6 oz  Weight (kg) 123.4 kg 123.378 kg 124.558 kg     Body mass index is 34.93 kg/m.  General:  Well nourished, well developed, in no acute distress*** HEENT: normal Neck: no JVD Vascular: No carotid bruits; Distal pulses 2+ bilaterally Cardiac:  normal S1, S2; RRR; no murmur *** Lungs:  clear to auscultation bilaterally, no wheezing, rhonchi or rales  Abd: soft, nontender, no hepatomegaly  Ext: no edema Musculoskeletal:  No deformities, BUE and BLE strength normal and equal Skin: warm and dry  Neuro:  CNs 2-12 intact, no focal abnormalities noted Psych:  Normal affect   EKG:  The EKG was personally reviewed and demonstrates:  *** Telemetry:  Telemetry was personally reviewed and demonstrates:  ***  Relevant CV Studies: ***  Laboratory Data:  High Sensitivity Troponin:   Recent Labs  Lab 05/19/22 1450 05/19/22 1622  TROPONINIHS 68* 274*     Chemistry Recent Labs  Lab 05/19/22 1450  NA 125*  K 4.5  CL 93*  CO2 23  GLUCOSE 528*  BUN 17  CREATININE 1.51*  CALCIUM 9.0  GFRNONAA 56*  ANIONGAP 9    Recent Labs  Lab 05/19/22 1450  PROT 8.8*  ALBUMIN 4.2  AST 27  ALT 27  ALKPHOS 110  BILITOT 0.8   Lipids No results for input(s): "CHOL", "TRIG", "HDL", "LABVLDL", "LDLCALC", "CHOLHDL" in the last 168 hours.  Hematology Recent Labs  Lab 05/19/22 1450  WBC 7.7  RBC 5.02  HGB 14.1  HCT 41.1  MCV 81.9  MCH 28.1  MCHC 34.3  RDW 12.2  PLT 227   Thyroid No results for input(s): "TSH", "FREET4" in the last 168 hours.  BNPNo results for input(s): "BNP", "PROBNP"  in the last 168 hours.  DDimer No results for input(s): "DDIMER" in the last 168 hours.   Radiology/Studies:  CT Head Wo Contrast  Result Date: 05/19/2022 CLINICAL DATA:  Altered level of consciousness, syncope, dizziness, hyperglycemia EXAM: CT HEAD WITHOUT CONTRAST TECHNIQUE: Contiguous axial images were obtained from the base of the skull through the vertex without  intravenous contrast. RADIATION DOSE REDUCTION: This exam was performed according to the departmental dose-optimization program which includes automated exposure control, adjustment of the mA and/or kV according to patient size and/or use of iterative reconstruction technique. COMPARISON:  None Available. FINDINGS: Brain: No acute infarct or hemorrhage. Lateral ventricles and midline structures are unremarkable. No acute extra-axial fluid collections. No mass effect. Vascular: No hyperdense vessel or unexpected calcification. Skull: Normal. Negative for fracture or focal lesion. Sinuses/Orbits: Minimal polypoid mucosal thickening right maxillary sinus. Remaining paranasal sinuses are clear. Other: None. IMPRESSION: 1. No acute intracranial process. Electronically Signed   By: Randa Ngo M.D.   On: 05/19/2022 15:02   DG Chest Port 1 View  Result Date: 05/19/2022 CLINICAL DATA:  Syncope.  Dizziness. EXAM: PORTABLE CHEST 1 VIEW COMPARISON:  11/18/2021. FINDINGS: Normal heart, mediastinum and hila. Clear lungs.  No pleural effusion or pneumothorax. Skeletal structures are grossly intact. IMPRESSION: No active disease. Electronically Signed   By: Lajean Manes M.D.   On: 05/19/2022 14:39     Assessment and Plan:   ***   Risk Assessment/Risk Scores:  {Complete the following score calculators/questions to meet required metrics.  Press F2         :[id number]   {Is the patient being seen for unstable angina, ACS, NSTEMI or STEMI?:[phone]} {Does this patient have CHF or CHF symptoms?      :[id number] {Does this patient have ATRIAL  FIBRILLATION?[phone]}  {Are we signing off today?:[id number]}  For questions or updates, please contact [facility] Please consult [email or web address] for contact info under    Signed, Ewing Schlein, MD  05/19/2022 10:27 PM

## 2022-05-20 ENCOUNTER — Encounter (HOSPITAL_COMMUNITY)
Admission: EM | Disposition: A | Discharge status: Home / Self Care | Payer: Self-pay | Source: Home / Self Care | Attending: Student

## 2022-05-20 ENCOUNTER — Other Ambulatory Visit: Payer: Self-pay

## 2022-05-20 ENCOUNTER — Observation Stay (HOSPITAL_COMMUNITY): Payer: 59

## 2022-05-20 ENCOUNTER — Other Ambulatory Visit (HOSPITAL_COMMUNITY): Payer: Self-pay

## 2022-05-20 DIAGNOSIS — I214 Non-ST elevation (NSTEMI) myocardial infarction: Secondary | ICD-10-CM | POA: Diagnosis not present

## 2022-05-20 DIAGNOSIS — Z79899 Other long term (current) drug therapy: Secondary | ICD-10-CM | POA: Diagnosis not present

## 2022-05-20 DIAGNOSIS — Z803 Family history of malignant neoplasm of breast: Secondary | ICD-10-CM | POA: Diagnosis not present

## 2022-05-20 DIAGNOSIS — E785 Hyperlipidemia, unspecified: Secondary | ICD-10-CM | POA: Diagnosis not present

## 2022-05-20 DIAGNOSIS — E1143 Type 2 diabetes mellitus with diabetic autonomic (poly)neuropathy: Secondary | ICD-10-CM | POA: Diagnosis not present

## 2022-05-20 DIAGNOSIS — I951 Orthostatic hypotension: Secondary | ICD-10-CM | POA: Diagnosis not present

## 2022-05-20 DIAGNOSIS — R55 Syncope and collapse: Secondary | ICD-10-CM | POA: Diagnosis not present

## 2022-05-20 DIAGNOSIS — R739 Hyperglycemia, unspecified: Secondary | ICD-10-CM | POA: Diagnosis not present

## 2022-05-20 DIAGNOSIS — Z8249 Family history of ischemic heart disease and other diseases of the circulatory system: Secondary | ICD-10-CM | POA: Diagnosis not present

## 2022-05-20 DIAGNOSIS — N179 Acute kidney failure, unspecified: Secondary | ICD-10-CM | POA: Diagnosis not present

## 2022-05-20 DIAGNOSIS — R7989 Other specified abnormal findings of blood chemistry: Secondary | ICD-10-CM

## 2022-05-20 DIAGNOSIS — Z7984 Long term (current) use of oral hypoglycemic drugs: Secondary | ICD-10-CM | POA: Diagnosis not present

## 2022-05-20 DIAGNOSIS — Z794 Long term (current) use of insulin: Secondary | ICD-10-CM | POA: Diagnosis not present

## 2022-05-20 DIAGNOSIS — E860 Dehydration: Secondary | ICD-10-CM | POA: Diagnosis not present

## 2022-05-20 DIAGNOSIS — R0789 Other chest pain: Secondary | ICD-10-CM | POA: Diagnosis not present

## 2022-05-20 DIAGNOSIS — Z6834 Body mass index (BMI) 34.0-34.9, adult: Secondary | ICD-10-CM | POA: Diagnosis not present

## 2022-05-20 DIAGNOSIS — I2510 Atherosclerotic heart disease of native coronary artery without angina pectoris: Secondary | ICD-10-CM

## 2022-05-20 DIAGNOSIS — R9431 Abnormal electrocardiogram [ECG] [EKG]: Secondary | ICD-10-CM | POA: Diagnosis not present

## 2022-05-20 DIAGNOSIS — Z7982 Long term (current) use of aspirin: Secondary | ICD-10-CM | POA: Diagnosis not present

## 2022-05-20 DIAGNOSIS — I10 Essential (primary) hypertension: Secondary | ICD-10-CM | POA: Diagnosis not present

## 2022-05-20 DIAGNOSIS — E1165 Type 2 diabetes mellitus with hyperglycemia: Secondary | ICD-10-CM | POA: Diagnosis not present

## 2022-05-20 HISTORY — PX: CORONARY STENT INTERVENTION: CATH118234

## 2022-05-20 HISTORY — PX: LEFT HEART CATH AND CORONARY ANGIOGRAPHY: CATH118249

## 2022-05-20 LAB — GLUCOSE, CAPILLARY
Glucose-Capillary: 253 mg/dL — ABNORMAL HIGH (ref 70–99)
Glucose-Capillary: 340 mg/dL — ABNORMAL HIGH (ref 70–99)
Glucose-Capillary: 316 mg/dL — ABNORMAL HIGH (ref 70–99)

## 2022-05-20 LAB — BASIC METABOLIC PANEL
Anion gap: 7 (ref 5–15)
BUN: 15 mg/dL (ref 6–20)
CO2: 25 mmol/L (ref 22–32)
Calcium: 8.6 mg/dL — ABNORMAL LOW (ref 8.9–10.3)
Chloride: 102 mmol/L (ref 98–111)
Creatinine, Ser: 1.15 mg/dL (ref 0.61–1.24)
GFR, Estimated: >60 mL/min (ref >60)
Glucose, Bld: 225 mg/dL — ABNORMAL HIGH (ref 70–99)
Potassium: 3.6 mmol/L (ref 3.5–5.1)
Sodium: 134 mmol/L — ABNORMAL LOW (ref 135–145)

## 2022-05-20 LAB — CBC
HCT: 35.5 % — ABNORMAL LOW (ref 39.0–52.0)
Hemoglobin: 12.7 g/dL — ABNORMAL LOW (ref 13.0–17.0)
MCH: 29.1 pg (ref 26.0–34.0)
MCHC: 35.8 g/dL (ref 30.0–36.0)
MCV: 81.2 fL (ref 80.0–100.0)
Platelets: 219 10*3/uL (ref 150–400)
RBC: 4.37 MIL/uL (ref 4.22–5.81)
RDW: 12.2 % (ref 11.5–15.5)
WBC: 7.7 10*3/uL (ref 4.0–10.5)
nRBC: 0 % (ref 0.0–0.2)

## 2022-05-20 LAB — POCT ACTIVATED CLOTTING TIME
Activated Clotting Time: 266 seconds
Activated Clotting Time: 358 seconds

## 2022-05-20 LAB — HEPARIN LEVEL (UNFRACTIONATED)
Heparin Unfractionated: 0.43 IU/mL (ref 0.30–0.70)
Heparin Unfractionated: 0.67 IU/mL (ref 0.30–0.70)

## 2022-05-20 LAB — HEMOGLOBIN A1C
Hgb A1c MFr Bld: 12.5 % — ABNORMAL HIGH (ref 4.8–5.6)
Mean Plasma Glucose: 312.05 mg/dL

## 2022-05-20 LAB — HIV ANTIBODY (ROUTINE TESTING W REFLEX): HIV Screen 4th Generation wRfx: NONREACTIVE

## 2022-05-20 SURGERY — LEFT HEART CATH AND CORONARY ANGIOGRAPHY
Anesthesia: LOCAL

## 2022-05-20 MED ORDER — HYDRALAZINE HCL 20 MG/ML IJ SOLN: 10.0000 mg | INTRAMUSCULAR | Status: AC | PRN

## 2022-05-20 MED ORDER — HEPARIN (PORCINE) IN NACL 1000-0.9 UT/500ML-% IV SOLN
INTRAVENOUS | Status: DC | PRN
  Administered 2022-05-20 (×2): 500 mL

## 2022-05-20 MED ORDER — SODIUM CHLORIDE 0.9% FLUSH
3.0000 mL | Freq: Two times a day (BID) | INTRAVENOUS | Status: DC
  Administered 2022-05-21: 3 mL via INTRAVENOUS

## 2022-05-20 MED ORDER — ASPIRIN 81 MG PO CHEW: 81.0000 mg | CHEWABLE_TABLET | Freq: Every day | ORAL | Status: DC

## 2022-05-20 MED ORDER — HEPARIN SODIUM (PORCINE) 1000 UNIT/ML IJ SOLN
INTRAMUSCULAR | Status: AC
  Filled 2022-05-20: qty 10

## 2022-05-20 MED ORDER — LABETALOL HCL 5 MG/ML IV SOLN: 10.0000 mg | INTRAVENOUS | Status: AC | PRN

## 2022-05-20 MED ORDER — MIDAZOLAM HCL 2 MG/2ML IJ SOLN
INTRAMUSCULAR | Status: DC | PRN
  Administered 2022-05-20: 1 mg via INTRAVENOUS

## 2022-05-20 MED ORDER — SODIUM CHLORIDE 0.9% FLUSH: 3.0000 mL | INTRAVENOUS | Status: DC | PRN

## 2022-05-20 MED ORDER — INSULIN GLARGINE-YFGN 100 UNIT/ML ~~LOC~~ SOLN
30.0000 [IU] | Freq: Every day | SUBCUTANEOUS | Status: DC
  Administered 2022-05-20 – 2022-05-21 (×2): 30 [IU] via SUBCUTANEOUS
  Filled 2022-05-20 (×2): qty 0.3

## 2022-05-20 MED ORDER — SODIUM CHLORIDE 0.9 % IV SOLN: 250.0000 mL | INTRAVENOUS | Status: DC | PRN

## 2022-05-20 MED ORDER — HEPARIN SODIUM (PORCINE) 1000 UNIT/ML IJ SOLN
INTRAMUSCULAR | Status: DC | PRN
  Administered 2022-05-20: 3000 [IU] via INTRAVENOUS
  Administered 2022-05-20 (×2): 6000 [IU] via INTRAVENOUS

## 2022-05-20 MED ORDER — ORAL CARE MOUTH RINSE: 15.0000 mL | OROMUCOSAL | Status: DC | PRN

## 2022-05-20 MED ORDER — SODIUM CHLORIDE 0.9% FLUSH: 3.0000 mL | Freq: Two times a day (BID) | INTRAVENOUS | Status: DC

## 2022-05-20 MED ORDER — SODIUM CHLORIDE 0.9 % IV SOLN: INTRAVENOUS | Status: DC

## 2022-05-20 MED ORDER — FENTANYL CITRATE (PF) 100 MCG/2ML IJ SOLN
INTRAMUSCULAR | Status: AC
  Filled 2022-05-20: qty 2

## 2022-05-20 MED ORDER — VERAPAMIL HCL 2.5 MG/ML IV SOLN
INTRA_ARTERIAL | Status: DC | PRN
  Administered 2022-05-20: 10 mL via INTRA_ARTERIAL

## 2022-05-20 MED ORDER — TICAGRELOR 90 MG PO TABS
ORAL_TABLET | ORAL | Status: DC | PRN
  Administered 2022-05-20: 180 mg via ORAL

## 2022-05-20 MED ORDER — ONDANSETRON HCL 4 MG/2ML IJ SOLN: 4.0000 mg | Freq: Four times a day (QID) | INTRAMUSCULAR | Status: DC | PRN

## 2022-05-20 MED ORDER — LIDOCAINE HCL (PF) 1 % IJ SOLN
INTRAMUSCULAR | Status: DC | PRN
  Administered 2022-05-20: 5 mL via INTRADERMAL

## 2022-05-20 MED ORDER — INSULIN ASPART 100 UNIT/ML IJ SOLN
0.0000 [IU] | INTRAMUSCULAR | Status: DC
  Administered 2022-05-20: 11 [IU] via SUBCUTANEOUS
  Administered 2022-05-20 (×2): 15 [IU] via SUBCUTANEOUS
  Administered 2022-05-21 (×2): 11 [IU] via SUBCUTANEOUS

## 2022-05-20 MED ORDER — LIDOCAINE HCL (PF) 1 % IJ SOLN
INTRAMUSCULAR | Status: AC
  Filled 2022-05-20: qty 30

## 2022-05-20 MED ORDER — ACETAMINOPHEN 325 MG PO TABS: 650.0000 mg | ORAL_TABLET | ORAL | Status: DC | PRN

## 2022-05-20 MED ORDER — SODIUM CHLORIDE 0.9 % IV SOLN
INTRAVENOUS | Status: AC | PRN
  Administered 2022-05-20: 20 mL/h via INTRAVENOUS

## 2022-05-20 MED ORDER — IOHEXOL 350 MG/ML SOLN
INTRAVENOUS | Status: DC | PRN
  Administered 2022-05-20: 115 mL

## 2022-05-20 MED ORDER — TICAGRELOR 90 MG PO TABS
90.0000 mg | ORAL_TABLET | Freq: Two times a day (BID) | ORAL | Status: DC
  Administered 2022-05-21 (×2): 90 mg via ORAL
  Filled 2022-05-20 (×2): qty 1

## 2022-05-20 MED ORDER — NITROGLYCERIN 1 MG/10 ML FOR IR/CATH LAB
INTRA_ARTERIAL | Status: AC
  Filled 2022-05-20: qty 10

## 2022-05-20 MED ORDER — MIDAZOLAM HCL 2 MG/2ML IJ SOLN
INTRAMUSCULAR | Status: AC
  Filled 2022-05-20: qty 2

## 2022-05-20 MED ORDER — FENTANYL CITRATE (PF) 100 MCG/2ML IJ SOLN
INTRAMUSCULAR | Status: DC | PRN
  Administered 2022-05-20: 25 ug via INTRAVENOUS

## 2022-05-20 MED ORDER — VERAPAMIL HCL 2.5 MG/ML IV SOLN
INTRAVENOUS | Status: AC
  Filled 2022-05-20: qty 2

## 2022-05-20 MED ORDER — SODIUM CHLORIDE 0.9 % IV SOLN: INTRAVENOUS | Status: AC

## 2022-05-20 MED ORDER — TICAGRELOR 90 MG PO TABS
ORAL_TABLET | ORAL | Status: AC
  Filled 2022-05-20: qty 2

## 2022-05-20 MED ORDER — ATORVASTATIN CALCIUM 80 MG PO TABS
80.0000 mg | ORAL_TABLET | Freq: Every day | ORAL | Status: DC
  Administered 2022-05-21: 80 mg via ORAL
  Filled 2022-05-20: qty 1

## 2022-05-20 MED ORDER — MORPHINE SULFATE (PF) 2 MG/ML IV SOLN: 2.0000 mg | INTRAVENOUS | Status: DC | PRN

## 2022-05-20 SURGICAL SUPPLY — 18 items
BALLN EMERGE MR 2.0X12 (BALLOONS) ×1
BALLOON EMERGE MR 2.0X12 (BALLOONS) IMPLANT
CATH INFINITI JR4 5F (CATHETERS) IMPLANT
CATH LAUNCHER 6FR JR4 (CATHETERS) IMPLANT
CATH OPTITORQUE TIG 4.0 5F (CATHETERS) IMPLANT
DEVICE RAD COMP TR BAND LRG (VASCULAR PRODUCTS) IMPLANT
GLIDESHEATH SLEND A-KIT 6F 22G (SHEATH) IMPLANT
GUIDEWIRE INQWIRE 1.5J.035X260 (WIRE) IMPLANT
INQWIRE 1.5J .035X260CM (WIRE) ×1
KIT ENCORE 26 ADVANTAGE (KITS) IMPLANT
KIT HEART LEFT (KITS) ×1 IMPLANT
PACK CARDIAC CATHETERIZATION (CUSTOM PROCEDURE TRAY) ×1 IMPLANT
STENT SYNERGY XD 2.50X16 (Permanent Stent) IMPLANT
SYNERGY XD 2.50X16 (Permanent Stent) ×1 IMPLANT
TRANSDUCER W/STOPCOCK (MISCELLANEOUS) ×1 IMPLANT
TUBING CIL FLEX 10 FLL-RA (TUBING) ×1 IMPLANT
WIRE ASAHI PROWATER 180CM (WIRE) IMPLANT
WIRE HI TORQ VERSACORE-J 145CM (WIRE) IMPLANT

## 2022-05-20 NOTE — Progress Notes (Signed)
ANTICOAGULATION CONSULT NOTE - Follow Up Consult  Pharmacy Consult for heparin Indication:  NSTEMI  Labs: Recent Labs    05/19/22 1450 05/19/22 1622 05/20/22 0107  HGB 14.1  --   --   HCT 41.1  --   --   PLT 227  --   --   CREATININE 1.51*  --  1.15  TROPONINIHS 68* 274*  --     Assessment/Plan:  52yo male therapeutic on heparin with initial dosing for NSTEMI. Will continue infusion at current rate of 1600 units/hr and confirm stable with additional level.   Wynona Neat, PharmD, BCPS  05/20/2022,3:24 AM

## 2022-05-20 NOTE — Progress Notes (Signed)
PROGRESS NOTE  [place] [id number] DOB: 06/21/1970   PCP: [name], MD  Patient is from: Home  DOA: 05/19/2022 LOS: 0  Chief complaints Chief Complaint  Patient presents with   Hyperglycemia     Brief Narrative / Interim history: 52 year old M with PMH of DM-2 with neuropathy and autonomic dysfunction, orthostatic hypotension, hypertension and obesity presented to ED after 2 syncopal episodes the day of presentation. Patient was painting, when suddenly he felt dizzy, he tried to get down but did not quite make it before he passed out. Spouse reports patient came to, then passed out again.  He also had a brief episode of staring but no other seizure-like activity.  In ED, vital signs stable.  Troponin elevated to 68 and trended up to 274.  Initial EKG showed ST changes in inferior leads that seems to have resolved on subsequent EKG.  Blood glucose elevated to 528.  He was given 20 units of NovoLog and 1 L IV fluid bolus.  Cardiology consulted and recommended starting heparin drip and admitting patient to [facility] for further evaluation.  Orthostatic vitals positive in ED.    Subjective: Seen and examined earlier this morning.  No major events overnight of this morning.  Reports brief episode of shortness of breath about 3 AM earlier this morning when he tries to lie down.  He denies chest pain or further dizziness.  Denies GI or UTI symptoms.  Patient's wife and mother-in-law at bedside.  Objective: Vitals:   05/19/22 2030 05/19/22 2330 05/20/22 0019 05/20/22 0404  BP: 120/82  111/75 139/85  Pulse: 92 80 83 80  Resp: 13 11 15 17  $ Temp: 98.6 F (37 C)  98.4 F (36.9 C) 98 F (36.7 C)  TempSrc: Oral  Oral Oral  SpO2: 100% 98% 98% 98%  Weight:      Height:        Examination:  GENERAL: No apparent distress.  Nontoxic. HEENT: MMM.  Vision and hearing grossly intact.  NECK: Supple.  No apparent JVD.  RESP:  No IWOB.  Fair aeration bilaterally. CVS:  RRR. Heart  sounds normal.  ABD/GI/GU: BS+. Abd soft, NTND.  MSK/EXT:  Moves extremities. No apparent deformity. No edema.  SKIN: no apparent skin lesion or wound NEURO: Awake, alert and oriented appropriately.  No apparent focal neuro deficit. PSYCH: Calm. Normal affect.   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  Has history of autonomic dysfunction and orthostatic hypotension.  Initial orthostatic vitals positive in ED.  No head trauma.  CT head without acute finding.  He was hyperglycemic.  Initial EKG with ST changes in inferior leads that seems to have resolved on subsequent EKG.  Troponin 68 > 274.  Head CT negative for acute abnormality.  Echo 01/2022, EF of 60 to 65%, mild LVH.  He had brief staring spell but no history of seizure.  EEG normal.   -Cardiology following-plan for left heart catheterization today -Optimize diabetic control -Avoid dehydration -Check orthostatic vitals every morning   Elevated troponin/abnormal EKG: Patient has not no chest pain.  Intermittent shortness of breath but not exertional.  Appears euvolemic on exam. Received full dose ASA in ED. -Continue IV heparin -Plan for left heart catheterization today -Continue aspirin and statin.   Uncontrolled IDDM-2 with hyperglycemia and neuropathy: A1c 12.5%.  Per patient, Lantus increased from 30 to 35 units daily but has not started new dose.   NovoLog 10 units 3 times daily and glipizide XL 10 mg a day. Recent Labs  Lab 05/19/22 1423 05/19/22 1835 05/19/22 2032 05/20/22 0617  GLUCAP 581* 282* 215* 253*  -Increase  SSI to resistant -Hold basal insulin while NPO -Continue IV fluid hydration -Continue statin  Orthostatic hypotension: Concern about autonomic dysregulation from uncontrolled diabetes. -See syncope and collapse -He may need to wear compression socks.  Morbid obesity: Elevated BMI with comorbidity as above. Body mass index is 34.93 kg/m.           DVT prophylaxis:  On full dose anticoagulation  Code Status: Full code Family Communication: Updated patient's wife and mother-in-law at bedside Level of care: Telemetry Medical Status is: Observation The patient will require care spanning > 2 midnights and should be moved to inpatient because: Elevated troponin, syncope and orthostatic hypotension   Final disposition: Likely home once medically cleared Consultants:  Cardiology  55 minutes with more than 50% spent in reviewing records, counseling patient/family and coordinating care.   Sch Meds:  Scheduled Meds:  [MAR Hold] aspirin EC  81 mg Oral Daily   [MAR Hold] insulin aspart  0-20 Units Subcutaneous Q4H   [MAR Hold] rosuvastatin  20 mg Oral Daily   sodium chloride flush  3 mL Intravenous Q12H   [MAR Hold] sodium chloride flush  3 mL Intravenous Q12H   Continuous Infusions:  sodium chloride 100 mL/hr at 05/20/22 0555   sodium chloride     sodium chloride     heparin 1,600 Units/hr (05/20/22 0340)   PRN Meds:.sodium chloride, [MAR Hold] acetaminophen **OR** [MAR Hold] acetaminophen, [MAR Hold] ondansetron **OR** [MAR Hold] ondansetron (ZOFRAN) IV, [MAR Hold] mouth rinse, [MAR Hold] polyethylene glycol, sodium chloride flush  Antimicrobials: Anti-infectives (From admission, onward)    None        I have personally reviewed the following labs and images: CBC: Recent Labs  Lab 05/19/22 1450 05/20/22 0107  WBC 7.7 7.7  NEUTROABS 5.9  --   HGB 14.1 12.7*  HCT 41.1 35.5*  MCV 81.9 81.2  PLT 227 219   BMP &GFR Recent Labs  Lab 05/19/22 1450  05/20/22 0107  NA 125* 134*  K 4.5 3.6  CL 93* 102  CO2 23 25  GLUCOSE 528* 225*  BUN 17 15  CREATININE 1.51* 1.15  CALCIUM 9.0 8.6*   Estimated Creatinine Clearance: 108.5 mL/min (by C-G formula based on SCr of 1.15 mg/dL). Liver & Pancreas: Recent Labs  Lab 05/19/22 1450  AST 27  ALT 27  ALKPHOS 110  BILITOT 0.8  PROT 8.8*  ALBUMIN 4.2   No results for input(s): "LIPASE", "AMYLASE" in the last 168 hours. No results for input(s): "AMMONIA" in the last 168 hours. Diabetic: Recent Labs    05/19/22 1631  HGBA1C 12.5*   Recent Labs  Lab 05/19/22 1423 05/19/22 1835 05/19/22 2032 05/20/22 0617  GLUCAP 581* 282* 215* 253*   Cardiac Enzymes: No results for input(s): "CKTOTAL", "CKMB", "CKMBINDEX", "TROPONINI" in the last 168 hours. No results for input(s): "PROBNP" in the last 8760 hours. Coagulation Profile: No results for input(s): "INR", "PROTIME" in the last 168 hours. Thyroid Function Tests: No results for input(s): "TSH", "T4TOTAL", "  FREET4", "T3FREE", "THYROIDAB" in the last 72 hours. Lipid Profile: No results for input(s): "CHOL", "HDL", "LDLCALC", "TRIG", "CHOLHDL", "LDLDIRECT" in the last 72 hours. Anemia Panel: No results for input(s): "VITAMINB12", "FOLATE", "FERRITIN", "TIBC", "IRON", "RETICCTPCT" in the last 72 hours. Urine analysis:    Component Value Date/Time   COLORURINE STRAW (A) 05/19/2022 1626   APPEARANCEUR CLEAR 05/19/2022 1626   LABSPEC 1.022 05/19/2022 1626   PHURINE 5.0 05/19/2022 1626   GLUCOSEU >=500 (A) 05/19/2022 1626   GLUCOSEU >=1000 (A) 11/06/2021 1627   HGBUR NEGATIVE 05/19/2022 1626   BILIRUBINUR NEGATIVE 05/19/2022 1626   BILIRUBINUR negative 04/25/2022 0947   KETONESUR NEGATIVE 05/19/2022 1626   PROTEINUR NEGATIVE 05/19/2022 1626   UROBILINOGEN 0.2 04/25/2022 0947   UROBILINOGEN 0.2 11/06/2021 1627   NITRITE NEGATIVE 05/19/2022 1626   LEUKOCYTESUR NEGATIVE 05/19/2022 1626   Sepsis Labs: Invalid input(s):  "PROCALCITONIN", "LACTICIDVEN"  Microbiology: No results found for this or any previous visit (from the past 240 hour(s)).  Radiology Studies: EEG adult  Result Date: 23-May-2022 Lora Havens, MD     May 23, 2022 10:23 AM Patient Name: Chistian Burruel MRN: [id number] Epilepsy Attending: Lora Havens Referring Physician/Provider: Mercy Riding, MD Date: May 23, 2022 Duration: 21.18 mins Patient history: 52yo M with syncope. EEG to evaluate for seizure. Level of alertness: Awake AEDs during EEG study: None Technical aspects: This EEG study was done with scalp electrodes positioned according to the 10-20 International system of electrode placement. Electrical activity was reviewed with band pass filter of 1-70Hz$ , sensitivity of 7 uV/mm, display speed of 58m/sec with a 60Hz$  notched filter applied as appropriate. EEG data were recorded continuously and digitally stored.  Video monitoring was available and reviewed as appropriate. Description: The posterior dominant rhythm consists of 9 Hz activity of moderate voltage (25-35 uV) seen predominantly in posterior head regions, symmetric and reactive to eye opening and eye closing. Hyperventilation and photic stimulation were not performed.   IMPRESSION: This study is within normal limits. No seizures or epileptiform discharges were seen throughout the recording. A normal interictal EEG does not exclude the diagnosis of epilepsy. PLora Havens  CT Head Wo Contrast  Result Date: 05/19/2022 CLINICAL DATA:  Altered level of consciousness, syncope, dizziness, hyperglycemia EXAM: CT HEAD WITHOUT CONTRAST TECHNIQUE: Contiguous axial images were obtained from the base of the skull through the vertex without intravenous contrast. RADIATION DOSE REDUCTION: This exam was performed according to the departmental dose-optimization program which includes automated exposure control, adjustment of the mA and/or kV according to patient size and/or use of iterative  reconstruction technique. COMPARISON:  None Available. FINDINGS: Brain: No acute infarct or hemorrhage. Lateral ventricles and midline structures are unremarkable. No acute extra-axial fluid collections. No mass effect. Vascular: No hyperdense vessel or unexpected calcification. Skull: Normal. Negative for fracture or focal lesion. Sinuses/Orbits: Minimal polypoid mucosal thickening right maxillary sinus. Remaining paranasal sinuses are clear. Other: None. IMPRESSION: 1. No acute intracranial process. Electronically Signed   By: MRanda NgoM.D.   On: 05/19/2022 15:02   DG Chest Port 1 View  Result Date: 05/19/2022 CLINICAL DATA:  Syncope.  Dizziness. EXAM: PORTABLE CHEST 1 VIEW COMPARISON:  11/18/2021. FINDINGS: Normal heart, mediastinum and hila. Clear lungs.  No pleural effusion or pneumothorax. Skeletal structures are grossly intact. IMPRESSION: No active disease. Electronically Signed   By: DLajean ManesM.D.   On: 05/19/2022 14:39      Joshuan Bolander T. GLuther If 7PM-7AM, please contact night-coverage [email or web address] [phone] 11:10 AM

## 2022-05-20 NOTE — TOC Benefit Eligibility Note (Signed)
Patient Teacher, English as a foreign language completed.    The patient is currently admitted and upon discharge could be taking Brilinta 90 mg.  Product Not of Formulary Must Try Plavix or Effient first  The patient is insured through [place], [place] Patient Advocate Specialist [place] Patient Advocate Team Direct Number: [phone]  Fax: [phone]

## 2022-05-20 NOTE — Progress Notes (Signed)
Inpatient Diabetes Program Recommendations  AACE/ADA: New Consensus Statement on Inpatient Glycemic Control (2015)  Target Ranges:  Prepandial:   less than 140 mg/dL      Peak postprandial:   less than 180 mg/dL (1-2 hours)      Critically ill patients:  140 - 180 mg/dL   Lab Results  Component Value Date   GLUCAP 253 (H) 05/20/2022   HGBA1C 12.5 (H) 05/19/2022    Review of Glycemic Control  Latest Reference Range & Units 05/19/22 14:23 05/19/22 18:35 05/19/22 20:32 05/20/22 06:17  Glucose-Capillary 70 - 99 mg/dL 581 (HH) 282 (H) 215 (H) 253 (H)   Diabetes history: DM  Outpatient Diabetes medications:  Novolog 10 units tid with meals Lantus 30 units daily Dexcom sensor Glucotrol 10 mg daily Current orders for Inpatient glycemic control:  Novolog resistant q 4 hours  Inpatient Diabetes Program Recommendations:    Please consider adding Semglee 20 units daily.  Once eating will also need meal coverage tid with meals.  Will talk to patient about A1C when appropriate.    Thanks,  Adah Perl, RN, BC-ADM Inpatient Diabetes Coordinator Pager [phone]  (8a-5p)

## 2022-05-20 NOTE — Progress Notes (Signed)
ANTICOAGULATION CONSULT NOTE  Pharmacy Consult for Heparin Indication: chest pain/ACS  No Known Allergies  Patient Measurements: Height: 6' 2"$  (188 cm) Weight: 123.4 kg (272 lb 0.8 oz) IBW/kg (Calculated) : 82.2 Heparin Dosing Weight: 110 kg  Vital Signs: Temp: 98 F (36.7 C) (02/19 0404) Temp Source: Oral (02/19 0404) BP: 139/85 (02/19 0404) Pulse Rate: 80 (02/19 0404)  Labs: Recent Labs    05/19/22 1450 05/19/22 1622 05/20/22 0107 05/20/22 0726  HGB 14.1  --   --   --   HCT 41.1  --   --   --   PLT 227  --   --   --   HEPARINUNFRC  --   --   --  0.67  CREATININE 1.51*  --  1.15  --   TROPONINIHS 68* 274*  --   --      Estimated Creatinine Clearance: 108.5 mL/min (by C-G formula based on SCr of 1.15 mg/dL).   Medical History: Past Medical History:  Diagnosis Date   Abnormal EKG    Contusion of left foot    Diabetes mellitus without complication (HCC)    Dizziness    Near syncope    Palpitations    Vertigo     Medications:  Awaiting home med rec  Assessment: 52 y.o. M presents with hyperglycemia and 2 episodes of syncope. Trop 68>274. No AC PTA. To begin heparin for ACS.  Heparin level came back therapeutic at 0.67, on 1600 units/hr. No s/sx of bleeding or infusion issues.   Goal of Therapy:  Heparin level 0.3-0.7 units/ml Monitor platelets by anticoagulation protocol: Yes   Plan:  Continue heparin gtt at 1600 units/hr Will f/u heparin level in 6 hours Daily heparin level and CBC  Antonietta Jewel, PharmD, Advance Pharmacist  Phone: [phone] 05/20/2022 8:10 AM  Please check AMION for all [place] phone numbers After 10:00 PM, call [place] [phone]

## 2022-05-20 NOTE — Consult Note (Incomplete)
Cardiology Consultation   Patient ID: Ayyub Dillie MRN: [id number]; DOB: 07-28-1970  Admit date: 05/19/2022 Date of Consult: 05/19/2022  PCP:  [name], MD   [place] Providers Cardiologist:  Lauree Chandler, MD   { Click here to update MD or APP on Care Team, Refresh:1}     Patient Profile:   Kahlen Heyer is a 52 y.o. male with a hx of diabetes, hyperlipidemia, vertigo and hypotension who is being seen 05/19/2022 for the evaluation of syncope and possible ACS at the request of ER.  History of Present Illness:   Mr. Mikulak is known to have recurrent episodes of pre-syncope. He was seen in the cardiology office and had extensive workup which included normal stress test, normal echocardiogram and 14-d monitor with short runs of SVT. He had been found to be orthostatic in the past and this was determined to be the cause of his prior pre-syncopal events.  Today patient states that he started painting inside the house at 10 am and around 1 pm he felt sudden onset of dizziness, called his wife, tried to sit down and suddenly blacked out. Per wife he was not responding for approx 10 min. He did not have tonic/clonic movements, no eye deviation. Wife said that at some point he opened his eyes and was just staring but not following commands. After he regained consciousness he was diaphoretic and short of breath. Denies chest pain. These symptoms lasted for approx 20 min until EMS arrived. He has never had episodes like this in the past. In fact, he states that all his prior events were when changing positions from sitting to standing and never resulted on blacking out.  He denies palpiations prior or after.  Vitals snl on arrival. Labs were relevant for glucose of 581, Na 125 (133 corrected) and Cr of 1.51 (baseline 1.1). Trop 68 -> 274 No acute CP process of CXR ECG with inferior ST segment changes.  Past Medical History:  Diagnosis Date  . Abnormal EKG   .  Contusion of left foot   . Diabetes mellitus without complication ([place])   . Dizziness   . Near syncope   . Palpitations   . Vertigo     Past Surgical History:  Procedure Laterality Date  . NO PAST SURGERIES       Home Medications:  Prior to Admission medications   Medication Sig Start Date End Date Taking? Authorizing Provider  Vitamin D, Ergocalciferol, (DRISDOL) 1.25 MG (50000 UNIT) CAPS capsule Take 1 capsule (50,000 Units total) by mouth every 7 (seven) days. 04/25/22   Henson, Vickie L, NP-C  aspirin EC 81 MG tablet Take 1 tablet (81 mg total) by mouth daily. Swallow whole. 02/05/22   [name], MD  chlorhexidine (PERIDEX) 0.12 % solution Use as directed 15 mLs in the mouth or throat 2 (two) times daily. 03/29/22   Volney American, PA-C  Continuous Blood Gluc Receiver (DEXCOM G6 RECEIVER) DEVI Use to check blood sugars 3x daily. DX: E11.40 11/08/21   [name], MD  Continuous Blood Gluc Sensor (DEXCOM G6 SENSOR) MISC use as directed daily 11/07/21   [name], MD  Continuous Blood Gluc Transmit (DEXCOM G6 TRANSMITTER) MISC Use to check blood sugars 3 times a day as directed 11/07/21   [name], MD  glipiZIDE (GLUCOTROL XL) 10 MG 24 hr tablet Take 1 tablet (10 mg total) by mouth daily with breakfast. 04/25/22   Henson, Vickie L,  NP-C  insulin aspart (NOVOLOG FLEXPEN) 100 UNIT/ML FlexPen Inject 10 Units into the skin 3 (three) times daily with meals. 12/14/21   [name], MD  Insulin Glargine [facility]) 100 UNIT/ML INJECT 30 UNITS INTO THE SKIN AT BEDTIME 11/07/21   [name], MD  Insulin Pen Needle (TRUEPLUS PEN NEEDLES) 31G X 5 MM MISC USE AS DIRECTED FOUR TIMES DAILY 11/22/21   [name], MD  omega-3 acid ethyl esters (LOVAZA) 1 g capsule Take 2 capsules (2 g total) by mouth 2 (two) times daily. 11/07/21   [name], MD  rosuvastatin (CRESTOR) 20 MG tablet Take 1 tablet (20 mg total) by mouth daily. 11/07/21   [name], MD     Inpatient Medications: Scheduled Meds: . [START ON 05/20/2022] aspirin EC  81 mg Oral Daily  . insulin aspart  0-15 Units Subcutaneous Q4H  . [START ON 05/20/2022] rosuvastatin  20 mg Oral Daily   Continuous Infusions: . sodium chloride 100 mL/hr at 05/19/22 2037  . heparin 1,600 Units/hr (05/19/22 1857)   PRN Meds: acetaminophen **OR** acetaminophen, ondansetron **OR** ondansetron (ZOFRAN) IV, polyethylene glycol  Allergies:   No Known Allergies  Social History:   Social History   Socioeconomic History  . Marital status: Married    Spouse name: Not on file  . Number of children: 4  . Years of education: Not on file  . Highest education level: Not on file  Occupational History  . Occupation: Maintenance Tech  Tobacco Use  . Smoking status: Never  . Smokeless tobacco: Never  Vaping Use  . Vaping Use: Never used  Substance and Sexual Activity  . Alcohol use: No  . Drug use: No  . Sexual activity: Yes    Partners: Female  Other Topics Concern  . Not on file  Social History Narrative  . Not on file   Social Determinants of Health   Financial Resource Strain: Not on file  Food Insecurity: Not on file  Transportation Needs: Not on file  Physical Activity: Not on file  Stress: Not on file  Social Connections: Not on file  Intimate Partner Violence: Not on file    Family History:    Family History  Problem Relation Age of Onset  . Heart disease Mother   . Breast cancer Maternal Grandmother        diagnosed late 97's  . Breast cancer Maternal Aunt      ROS:  Please see the history of present illness.  All other ROS reviewed and negative.     Physical Exam/Data:   Vitals:   05/19/22 1930 05/19/22 1949 05/19/22 2000 05/19/22 2030  BP: (!) 127/96  (!) 143/89 120/82  Pulse: 87  88 92  Resp: (!) 22  14 13  $ Temp:  98.6 F (37 C)  98.6 F (37 C)  TempSrc:  Oral  Oral  SpO2: 97%  100% 100%  Weight:      Height:       No intake or output data in the  24 hours ending 05/19/22 2227    05/19/2022    4:30 PM 04/25/2022    9:09 AM 02/25/2022    9:10 AM  Last 3 Weights  Weight (lbs) 272 lb 0.8 oz 272 lb 274 lb 9.6 oz  Weight (kg) 123.4 kg 123.378 kg 124.558 kg     Body mass index is 34.93 kg/m.  General:  Well nourished, well developed, in no acute distress HEENT: normal Neck:  no JVD Vascular: No carotid bruits; Distal pulses 2+ bilaterally Cardiac:  normal S1, S2; RRR; no murmur  Lungs:  clear to auscultation bilaterally, no wheezing, rhonchi or rales  Abd: soft, nontender, no hepatomegaly  Ext: no edema Musculoskeletal:  No deformities, BUE and BLE strength normal and equal Skin: warm and dry  Neuro:  CNs 2-12 intact, no focal abnormalities noted Psych:  Normal affect   EKG:  The EKG was personally reviewed and demonstrates:  Non specific ST changes Telemetry:  Telemetry was personally reviewed and demonstrates:    Relevant CV Studies:  Laboratory Data:  High Sensitivity Troponin:   Recent Labs  Lab 05/19/22 1450 05/19/22 1622  TROPONINIHS 68* 274*     Chemistry Recent Labs  Lab 05/19/22 1450  NA 125*  K 4.5  CL 93*  CO2 23  GLUCOSE 528*  BUN 17  CREATININE 1.51*  CALCIUM 9.0  GFRNONAA 56*  ANIONGAP 9    Recent Labs  Lab 05/19/22 1450  PROT 8.8*  ALBUMIN 4.2  AST 27  ALT 27  ALKPHOS 110  BILITOT 0.8   Lipids No results for input(s): "CHOL", "TRIG", "HDL", "LABVLDL", "LDLCALC", "CHOLHDL" in the last 168 hours.  Hematology Recent Labs  Lab 05/19/22 1450  WBC 7.7  RBC 5.02  HGB 14.1  HCT 41.1  MCV 81.9  MCH 28.1  MCHC 34.3  RDW 12.2  PLT 227   Thyroid No results for input(s): "TSH", "FREET4" in the last 168 hours.  BNPNo results for input(s): "BNP", "PROBNP" in the last 168 hours.  DDimer No results for input(s): "DDIMER" in the last 168 hours.   Radiology/Studies:  CT Head Wo Contrast  Result Date: 05/19/2022 CLINICAL DATA:  Altered level of consciousness, syncope, dizziness,  hyperglycemia EXAM: CT HEAD WITHOUT CONTRAST TECHNIQUE: Contiguous axial images were obtained from the base of the skull through the vertex without intravenous contrast. RADIATION DOSE REDUCTION: This exam was performed according to the departmental dose-optimization program which includes automated exposure control, adjustment of the mA and/or kV according to patient size and/or use of iterative reconstruction technique. COMPARISON:  None Available. FINDINGS: Brain: No acute infarct or hemorrhage. Lateral ventricles and midline structures are unremarkable. No acute extra-axial fluid collections. No mass effect. Vascular: No hyperdense vessel or unexpected calcification. Skull: Normal. Negative for fracture or focal lesion. Sinuses/Orbits: Minimal polypoid mucosal thickening right maxillary sinus. Remaining paranasal sinuses are clear. Other: None. IMPRESSION: 1. No acute intracranial process. Electronically Signed   By: Randa Ngo M.D.   On: 05/19/2022 15:02   DG Chest Port 1 View  Result Date: 05/19/2022 CLINICAL DATA:  Syncope.  Dizziness. EXAM: PORTABLE CHEST 1 VIEW COMPARISON:  11/18/2021. FINDINGS: Normal heart, mediastinum and hila. Clear lungs.  No pleural effusion or pneumothorax. Skeletal structures are grossly intact. IMPRESSION: No active disease. Electronically Signed   By: Lajean Manes M.D.   On: 05/19/2022 14:39     Assessment and Plan:   Mr. Duffy is a 52 year old male with history of uncontrolled DM, orthostatic hypotension and hyperlipidemia who presented after an episode of syncope. The syncope does not have the classical features of cardiac syncope. However, his symptoms after he regained consciousness are concerning (diaphoresis and shortness of breath). His ECG lacks reciprocal changes but has inferior ST segment changes which late improved on third ECG  ***   Risk Assessment/Risk Scores:  {Complete the following score calculators/questions to meet required metrics.  Press  F2         :  [id number]   {Is the patient being seen for unstable angina, ACS, NSTEMI or STEMI?[phone]} {Does this patient have CHF or CHF symptoms?      :[id number] {Does this patient have ATRIAL FIBRILLATION?:[id number]}  {Are we signing off today?:[id number]}  For questions or updates, please contact [place] Please consult [email or web address] for contact info under    Signed, Ewing Schlein, MD  05/19/2022 10:27 PM

## 2022-05-20 NOTE — Progress Notes (Signed)
EEG complete - results pending 

## 2022-05-20 NOTE — Interval H&P Note (Signed)
Cath Lab Visit (complete for each Cath Lab visit)  Clinical Evaluation Leading to the Procedure:   ACS: Yes.    Non-ACS:    Anginal Classification: CCS I  Anti-ischemic medical therapy: No Therapy  Non-Invasive Test Results: No non-invasive testing performed  Prior CABG: No previous CABG      History and Physical Interval Note:  05/20/2022 11:26 AM  Micheal Neal  has presented today for surgery, with the diagnosis of NSTEMI.  The various methods of treatment have been discussed with the patient and family. After consideration of risks, benefits and other options for treatment, the patient has consented to  Procedure(s): LEFT HEART CATH AND CORONARY ANGIOGRAPHY (N/A) as a surgical intervention.  The patient's history has been reviewed, patient examined, no change in status, stable for surgery.  I have reviewed the patient's chart and labs.  Questions were answered to the patient's satisfaction.     [name]

## 2022-05-20 NOTE — Procedures (Signed)
Patient Name: Micheal Neal  MRN: [id number]  Epilepsy Attending: Lora Havens  Referring Physician/Provider: Mercy Riding, MD  Date: 05/20/2022 Duration: 21.18 mins  Patient history: 52yo M with syncope. EEG to evaluate for seizure.  Level of alertness: Awake  AEDs during EEG study: None  Technical aspects: This EEG study was done with scalp electrodes positioned according to the 10-20 International system of electrode placement. Electrical activity was reviewed with band pass filter of 1-70Hz$ , sensitivity of 7 uV/mm, display speed of 79m/sec with a 60Hz$  notched filter applied as appropriate. EEG data were recorded continuously and digitally stored.  Video monitoring was available and reviewed as appropriate.  Description: The posterior dominant rhythm consists of 9 Hz activity of moderate voltage (25-35 uV) seen predominantly in posterior head regions, symmetric and reactive to eye opening and eye closing. Hyperventilation and photic stimulation were not performed.     IMPRESSION: This study is within normal limits. No seizures or epileptiform discharges were seen throughout the recording.  A normal interictal EEG does not exclude the diagnosis of epilepsy.  [name] OBarbra Sarks

## 2022-05-20 NOTE — Progress Notes (Signed)
Rounding Note    Patient Name: Micheal Neal Date of Encounter: 05/20/2022  [place] Cardiologist: Lauree Chandler, MD   Subjective   Feels well today. No further dizziness, dyspnea, chest pain, or diaphoresis.   Inpatient Medications    Scheduled Meds:  aspirin EC  81 mg Oral Daily   insulin aspart  0-20 Units Subcutaneous Q4H   rosuvastatin  20 mg Oral Daily   Continuous Infusions:  sodium chloride 100 mL/hr at 05/20/22 0555   heparin 1,600 Units/hr (05/20/22 0340)   PRN Meds: acetaminophen **OR** acetaminophen, ondansetron **OR** ondansetron (ZOFRAN) IV, mouth rinse, polyethylene glycol   Vital Signs    Vitals:   05/19/22 2030 05/19/22 2330 05/20/22 0019 05/20/22 0404  BP: 120/82  111/75 139/85  Pulse: 92 80 83 80  Resp: 13 11 15 17  $ Temp: 98.6 F (37 C)  98.4 F (36.9 C) 98 F (36.7 C)  TempSrc: Oral  Oral Oral  SpO2: 100% 98% 98% 98%  Weight:      Height:        Intake/Output Summary (Last 24 hours) at 05/20/2022 0926 Last data filed at 05/20/2022 0555 Gross per 24 hour  Intake 1108.56 ml  Output --  Net 1108.56 ml      05/19/2022    4:30 PM 04/25/2022    9:09 AM 02/25/2022    9:10 AM  Last 3 Weights  Weight (lbs) 272 lb 0.8 oz 272 lb 274 lb 9.6 oz  Weight (kg) 123.4 kg 123.378 kg 124.558 kg      Telemetry    NSR - Personally Reviewed  ECG    NSR rate 78 diffuse nonspecific TWA  - Personally Reviewed  Physical Exam   GEN: No acute distress.   Neck: No JVD Cardiac: RRR, no murmurs, rubs, or gallops.  Respiratory: Clear to auscultation bilaterally. GI: Soft, nontender, non-distended  MS: No edema; No deformity. Neuro:  Nonfocal  Psych: Normal affect   Labs    High Sensitivity Troponin:   Recent Labs  Lab 05/19/22 1450 05/19/22 1622  TROPONINIHS 68* 274*     Chemistry Recent Labs  Lab 05/19/22 1450 05/20/22 0107  NA 125* 134*  K 4.5 3.6  CL 93* 102  CO2 23 25  GLUCOSE 528* 225*  BUN 17 15   CREATININE 1.51* 1.15  CALCIUM 9.0 8.6*  PROT 8.8*  --   ALBUMIN 4.2  --   AST 27  --   ALT 27  --   ALKPHOS 110  --   BILITOT 0.8  --   GFRNONAA 56* >60  ANIONGAP 9 7    Lipids No results for input(s): "CHOL", "TRIG", "HDL", "LABVLDL", "LDLCALC", "CHOLHDL" in the last 168 hours.  Hematology Recent Labs  Lab 05/19/22 1450 05/20/22 0107  WBC 7.7 7.7  RBC 5.02 4.37  HGB 14.1 12.7*  HCT 41.1 35.5*  MCV 81.9 81.2  MCH 28.1 29.1  MCHC 34.3 35.8  RDW 12.2 12.2  PLT 227 219   Thyroid No results for input(s): "TSH", "FREET4" in the last 168 hours.  BNPNo results for input(s): "BNP", "PROBNP" in the last 168 hours.  DDimer No results for input(s): "DDIMER" in the last 168 hours.   Radiology    CT Head Wo Contrast  Result Date: 05/19/2022 CLINICAL DATA:  Altered level of consciousness, syncope, dizziness, hyperglycemia EXAM: CT HEAD WITHOUT CONTRAST TECHNIQUE: Contiguous axial images were obtained from the base of the skull through the vertex without intravenous contrast. RADIATION DOSE  REDUCTION: This exam was performed according to the departmental dose-optimization program which includes automated exposure control, adjustment of the mA and/or kV according to patient size and/or use of iterative reconstruction technique. COMPARISON:  None Available. FINDINGS: Brain: No acute infarct or hemorrhage. Lateral ventricles and midline structures are unremarkable. No acute extra-axial fluid collections. No mass effect. Vascular: No hyperdense vessel or unexpected calcification. Skull: Normal. Negative for fracture or focal lesion. Sinuses/Orbits: Minimal polypoid mucosal thickening right maxillary sinus. Remaining paranasal sinuses are clear. Other: None. IMPRESSION: 1. No acute intracranial process. Electronically Signed   By: Randa Ngo M.D.   On: 05/19/2022 15:02   DG Chest Port 1 View  Result Date: 05/19/2022 CLINICAL DATA:  Syncope.  Dizziness. EXAM: PORTABLE CHEST 1 VIEW  COMPARISON:  11/18/2021. FINDINGS: Normal heart, mediastinum and hila. Clear lungs.  No pleural effusion or pneumothorax. Skeletal structures are grossly intact. IMPRESSION: No active disease. Electronically Signed   By: Lajean Manes M.D.   On: 05/19/2022 14:39    Cardiac Studies   Myoview 11/27/21: Study Highlights      The study is normal. The study is low risk.   No ST deviation was noted.   Left ventricular function is normal. End diastolic cavity size is normal.   Prior study not available for comparison.  Echo: 02/15/22: IMPRESSIONS     1. Left ventricular ejection fraction, by estimation, is 60 to 65%. The  left ventricle has normal function. The left ventricle has no regional  wall motion abnormalities. There is mild left ventricular hypertrophy.  Left ventricular diastolic parameters  were normal.   2. Right ventricular systolic function is normal. The right ventricular  size is normal. Tricuspid regurgitation signal is inadequate for assessing  PA pressure.   3. The mitral valve is normal in structure. No evidence of mitral valve  regurgitation. No evidence of mitral stenosis.   4. The aortic valve was not well visualized. Aortic valve regurgitation  is not visualized. No aortic stenosis is present.   5. Aortic dilatation noted. There is mild dilatation of the ascending  aorta, measuring 39 mm.   Event monitor 02/20/22: Study Highlights    Patch Wear Time:  14 days and 0 hours (2023-10-27T11:34:54-0400 to 2023-11-10T10:34:54-0500)   Sinus rhythm. Patient had a min HR of 64 bpm, max HR of 147 bpm, and avg HR of 86 bpm.  6 Supraventricular Tachycardia runs occurred, the longest lasting 11.4 seconds.  Rare premature atrial contractions (<1.0%) Rare premature ventricular contractions (<1.0%)  Patient Profile     52 y.o. male with history of uncontrolled DM, orthostatic hypotension and hyperlipidemia who presented after an episode of syncope.   Assessment & Plan     NSTEMI. Patient presented with syncope. Developed lightheadedness followed by syncope. Had profuse diaphoresis and this persisted when he came to associated with acute SOB. No chest pain. Initial Ecg showed subtle ST elevation inferiorly that resolved. Patient has risk factors of uncontrolled DM, very strong family history of CAD. Recommend invasive evaluation with cardiac cath today. The procedure and risks were reviewed including but not limited to death, myocardial infarction, stroke, arrythmias, bleeding, transfusion, emergency surgery, dye allergy, or renal dysfunction. The patient voices understanding and is agreeable to proceed. Uncontrolled DM - per primary team History of orthostatic hypotension. Likely exacerbated by uncontrolled sugar and volume depletion. This will limit any anti anginal therapy Obesity. Syncope. Some atypical features. CT head is negative. EEG in progress. No arrhythmia on telemetry.  For questions or updates, please contact [place] Please consult [email or web address] for contact info under        Signed, Bailee Thall [place], MD  05/20/2022, 9:26 AM

## 2022-05-20 NOTE — H&P (View-Only) (Signed)
Rounding Note    Patient Name: Micheal Neal Date of Encounter: 05/20/2022  [place] Cardiologist: Lauree Chandler, MD   Subjective   Feels well today. No further dizziness, dyspnea, chest pain, or diaphoresis.   Inpatient Medications    Scheduled Meds:  aspirin EC  81 mg Oral Daily   insulin aspart  0-20 Units Subcutaneous Q4H   rosuvastatin  20 mg Oral Daily   Continuous Infusions:  sodium chloride 100 mL/hr at 05/20/22 0555   heparin 1,600 Units/hr (05/20/22 0340)   PRN Meds: acetaminophen **OR** acetaminophen, ondansetron **OR** ondansetron (ZOFRAN) IV, mouth rinse, polyethylene glycol   Vital Signs    Vitals:   05/19/22 2030 05/19/22 2330 05/20/22 0019 05/20/22 0404  BP: 120/82  111/75 139/85  Pulse: 92 80 83 80  Resp: 13 11 15 17  $ Temp: 98.6 F (37 C)  98.4 F (36.9 C) 98 F (36.7 C)  TempSrc: Oral  Oral Oral  SpO2: 100% 98% 98% 98%  Weight:      Height:        Intake/Output Summary (Last 24 hours) at 05/20/2022 0926 Last data filed at 05/20/2022 0555 Gross per 24 hour  Intake 1108.56 ml  Output --  Net 1108.56 ml      05/19/2022    4:30 PM 04/25/2022    9:09 AM 02/25/2022    9:10 AM  Last 3 Weights  Weight (lbs) 272 lb 0.8 oz 272 lb 274 lb 9.6 oz  Weight (kg) 123.4 kg 123.378 kg 124.558 kg      Telemetry    NSR - Personally Reviewed  ECG    NSR rate 78 diffuse nonspecific TWA  - Personally Reviewed  Physical Exam   GEN: No acute distress.   Neck: No JVD Cardiac: RRR, no murmurs, rubs, or gallops.  Respiratory: Clear to auscultation bilaterally. GI: Soft, nontender, non-distended  MS: No edema; No deformity. Neuro:  Nonfocal  Psych: Normal affect   Labs    High Sensitivity Troponin:   Recent Labs  Lab 05/19/22 1450 05/19/22 1622  TROPONINIHS 68* 274*     Chemistry Recent Labs  Lab 05/19/22 1450 05/20/22 0107  NA 125* 134*  K 4.5 3.6  CL 93* 102  CO2 23 25  GLUCOSE 528* 225*  BUN 17 15   CREATININE 1.51* 1.15  CALCIUM 9.0 8.6*  PROT 8.8*  --   ALBUMIN 4.2  --   AST 27  --   ALT 27  --   ALKPHOS 110  --   BILITOT 0.8  --   GFRNONAA 56* >60  ANIONGAP 9 7    Lipids No results for input(s): "CHOL", "TRIG", "HDL", "LABVLDL", "LDLCALC", "CHOLHDL" in the last 168 hours.  Hematology Recent Labs  Lab 05/19/22 1450 05/20/22 0107  WBC 7.7 7.7  RBC 5.02 4.37  HGB 14.1 12.7*  HCT 41.1 35.5*  MCV 81.9 81.2  MCH 28.1 29.1  MCHC 34.3 35.8  RDW 12.2 12.2  PLT 227 219   Thyroid No results for input(s): "TSH", "FREET4" in the last 168 hours.  BNPNo results for input(s): "BNP", "PROBNP" in the last 168 hours.  DDimer No results for input(s): "DDIMER" in the last 168 hours.   Radiology    CT Head Wo Contrast  Result Date: 05/19/2022 CLINICAL DATA:  Altered level of consciousness, syncope, dizziness, hyperglycemia EXAM: CT HEAD WITHOUT CONTRAST TECHNIQUE: Contiguous axial images were obtained from the base of the skull through the vertex without intravenous contrast. RADIATION DOSE  REDUCTION: This exam was performed according to the departmental dose-optimization program which includes automated exposure control, adjustment of the mA and/or kV according to patient size and/or use of iterative reconstruction technique. COMPARISON:  None Available. FINDINGS: Brain: No acute infarct or hemorrhage. Lateral ventricles and midline structures are unremarkable. No acute extra-axial fluid collections. No mass effect. Vascular: No hyperdense vessel or unexpected calcification. Skull: Normal. Negative for fracture or focal lesion. Sinuses/Orbits: Minimal polypoid mucosal thickening right maxillary sinus. Remaining paranasal sinuses are clear. Other: None. IMPRESSION: 1. No acute intracranial process. Electronically Signed   By: Randa Ngo M.D.   On: 05/19/2022 15:02   DG Chest Port 1 View  Result Date: 05/19/2022 CLINICAL DATA:  Syncope.  Dizziness. EXAM: PORTABLE CHEST 1 VIEW  COMPARISON:  11/18/2021. FINDINGS: Normal heart, mediastinum and hila. Clear lungs.  No pleural effusion or pneumothorax. Skeletal structures are grossly intact. IMPRESSION: No active disease. Electronically Signed   By: Lajean Manes M.D.   On: 05/19/2022 14:39    Cardiac Studies   Myoview 11/27/21: Study Highlights      The study is normal. The study is low risk.   No ST deviation was noted.   Left ventricular function is normal. End diastolic cavity size is normal.   Prior study not available for comparison.  Echo: 02/15/22: IMPRESSIONS     1. Left ventricular ejection fraction, by estimation, is 60 to 65%. The  left ventricle has normal function. The left ventricle has no regional  wall motion abnormalities. There is mild left ventricular hypertrophy.  Left ventricular diastolic parameters  were normal.   2. Right ventricular systolic function is normal. The right ventricular  size is normal. Tricuspid regurgitation signal is inadequate for assessing  PA pressure.   3. The mitral valve is normal in structure. No evidence of mitral valve  regurgitation. No evidence of mitral stenosis.   4. The aortic valve was not well visualized. Aortic valve regurgitation  is not visualized. No aortic stenosis is present.   5. Aortic dilatation noted. There is mild dilatation of the ascending  aorta, measuring 39 mm.   Event monitor 02/20/22: Study Highlights    Patch Wear Time:  14 days and 0 hours (2023-10-27T11:34:54-0400 to 2023-11-10T10:34:54-0500)   Sinus rhythm. Patient had a min HR of 64 bpm, max HR of 147 bpm, and avg HR of 86 bpm.  6 Supraventricular Tachycardia runs occurred, the longest lasting 11.4 seconds.  Rare premature atrial contractions (<1.0%) Rare premature ventricular contractions (<1.0%)  Patient Profile     52 y.o. male with history of uncontrolled DM, orthostatic hypotension and hyperlipidemia who presented after an episode of syncope.   Assessment & Plan     NSTEMI. Patient presented with syncope. Developed lightheadedness followed by syncope. Had profuse diaphoresis and this persisted when he came to associated with acute SOB. No chest pain. Initial Ecg showed subtle ST elevation inferiorly that resolved. Patient has risk factors of uncontrolled DM, very strong family history of CAD. Recommend invasive evaluation with cardiac cath today. The procedure and risks were reviewed including but not limited to death, myocardial infarction, stroke, arrythmias, bleeding, transfusion, emergency surgery, dye allergy, or renal dysfunction. The patient voices understanding and is agreeable to proceed. Uncontrolled DM - per primary team History of orthostatic hypotension. Likely exacerbated by uncontrolled sugar and volume depletion. This will limit any anti anginal therapy Obesity. Syncope. Some atypical features. CT head is negative. EEG in progress. No arrhythmia on telemetry.  For questions or updates, please contact [place] Please consult [email or web address] for contact info under        Signed, Ashunti Schofield [place], MD  05/20/2022, 9:26 AM

## 2022-05-21 ENCOUNTER — Other Ambulatory Visit (HOSPITAL_COMMUNITY): Payer: Self-pay

## 2022-05-21 ENCOUNTER — Inpatient Hospital Stay (HOSPITAL_COMMUNITY): Payer: 59

## 2022-05-21 ENCOUNTER — Encounter (HOSPITAL_COMMUNITY): Payer: Self-pay | Admitting: Cardiovascular Disease

## 2022-05-21 DIAGNOSIS — R9431 Abnormal electrocardiogram [ECG] [EKG]: Secondary | ICD-10-CM | POA: Diagnosis not present

## 2022-05-21 DIAGNOSIS — R739 Hyperglycemia, unspecified: Secondary | ICD-10-CM

## 2022-05-21 DIAGNOSIS — I2510 Atherosclerotic heart disease of native coronary artery without angina pectoris: Secondary | ICD-10-CM

## 2022-05-21 DIAGNOSIS — N179 Acute kidney failure, unspecified: Secondary | ICD-10-CM

## 2022-05-21 DIAGNOSIS — I214 Non-ST elevation (NSTEMI) myocardial infarction: Secondary | ICD-10-CM

## 2022-05-21 DIAGNOSIS — R55 Syncope and collapse: Secondary | ICD-10-CM | POA: Diagnosis not present

## 2022-05-21 DIAGNOSIS — Z794 Long term (current) use of insulin: Secondary | ICD-10-CM | POA: Diagnosis not present

## 2022-05-21 HISTORY — DX: Acute kidney failure, unspecified: N17.9

## 2022-05-21 LAB — GLUCOSE, CAPILLARY
Glucose-Capillary: 120 mg/dL — ABNORMAL HIGH (ref 70–99)
Glucose-Capillary: 140 mg/dL — ABNORMAL HIGH (ref 70–99)
Glucose-Capillary: 262 mg/dL — ABNORMAL HIGH (ref 70–99)
Glucose-Capillary: 264 mg/dL — ABNORMAL HIGH (ref 70–99)

## 2022-05-21 LAB — CBC
HCT: 37.2 % — ABNORMAL LOW (ref 39.0–52.0)
Hemoglobin: 12.7 g/dL — ABNORMAL LOW (ref 13.0–17.0)
MCH: 28.1 pg (ref 26.0–34.0)
MCHC: 34.1 g/dL (ref 30.0–36.0)
MCV: 82.3 fL (ref 80.0–100.0)
Platelets: 217 10*3/uL (ref 150–400)
RBC: 4.52 MIL/uL (ref 4.22–5.81)
RDW: 12.5 % (ref 11.5–15.5)
WBC: 6.3 10*3/uL (ref 4.0–10.5)
nRBC: 0 % (ref 0.0–0.2)

## 2022-05-21 LAB — ECHOCARDIOGRAM COMPLETE
AR max vel: 3.66 cm2
AV Area VTI: 3.64 cm2
AV Area mean vel: 3.4 cm2
AV Mean grad: 4 mmHg
AV Peak grad: 7.4 mmHg
Ao pk vel: 1.36 m/s
Area-P 1/2: 3.65 cm2
Height: 74 in
S' Lateral: 2.9 cm
Weight: 4352.76 oz

## 2022-05-21 LAB — LIPID PANEL
Cholesterol: 241 mg/dL — ABNORMAL HIGH (ref 0–200)
HDL: 31 mg/dL — ABNORMAL LOW (ref >40)
LDL Cholesterol: UNDETERMINED mg/dL (ref 0–99)
Total CHOL/HDL Ratio: 7.8 RATIO
Triglycerides: 455 mg/dL — ABNORMAL HIGH (ref <150)
VLDL: UNDETERMINED mg/dL (ref 0–40)

## 2022-05-21 LAB — RENAL FUNCTION PANEL
Albumin: 3.1 g/dL — ABNORMAL LOW (ref 3.5–5.0)
Anion gap: 8 (ref 5–15)
BUN: 10 mg/dL (ref 6–20)
CO2: 22 mmol/L (ref 22–32)
Calcium: 8.6 mg/dL — ABNORMAL LOW (ref 8.9–10.3)
Chloride: 107 mmol/L (ref 98–111)
Creatinine, Ser: 1.05 mg/dL (ref 0.61–1.24)
GFR, Estimated: >60 mL/min (ref >60)
Glucose, Bld: 94 mg/dL (ref 70–99)
Phosphorus: 3.7 mg/dL (ref 2.5–4.6)
Potassium: 3.7 mmol/L (ref 3.5–5.1)
Sodium: 137 mmol/L (ref 135–145)

## 2022-05-21 LAB — MAGNESIUM: Magnesium: 2 mg/dL (ref 1.7–2.4)

## 2022-05-21 LAB — LDL CHOLESTEROL, DIRECT: Direct LDL: 121 mg/dL — ABNORMAL HIGH (ref 0–99)

## 2022-05-21 MED ORDER — METOPROLOL SUCCINATE ER 25 MG PO TB24
12.5000 mg | ORAL_TABLET | Freq: Every day | ORAL | 1 refills | Status: DC
  Filled 2022-05-21 – 2022-06-12 (×2): qty 15, 30d supply, fill #0
  Filled 2022-06-25: qty 15, 30d supply, fill #1

## 2022-05-21 MED ORDER — TICAGRELOR 90 MG PO TABS
90.0000 mg | ORAL_TABLET | Freq: Two times a day (BID) | ORAL | 0 refills | Status: DC
  Filled 2022-05-21: qty 60, 30d supply, fill #0

## 2022-05-21 MED ORDER — ATORVASTATIN CALCIUM 80 MG PO TABS
80.0000 mg | ORAL_TABLET | Freq: Every day | ORAL | 3 refills | Status: DC
  Filled 2022-05-21 – 2022-06-12 (×2): qty 30, 30d supply, fill #0
  Filled 2022-07-16 – 2022-07-23 (×3): qty 30, 30d supply, fill #1
  Filled 2022-08-20: qty 30, 30d supply, fill #2
  Filled 2022-09-16: qty 30, 30d supply, fill #3
  Filled 2022-10-17: qty 30, 30d supply, fill #4
  Filled 2022-11-15: qty 30, 30d supply, fill #5
  Filled 2022-11-29 – 2022-12-18 (×2): qty 30, 30d supply, fill #6
  Filled 2023-01-25: qty 30, 30d supply, fill #7
  Filled 2023-02-25: qty 30, 30d supply, fill #8
  Filled 2023-03-30: qty 30, 30d supply, fill #9
  Filled 2023-04-16 – 2023-05-02 (×4): qty 30, 30d supply, fill #10

## 2022-05-21 MED ORDER — NOVOLIN 70/30 FLEXPEN RELION (70-30) 100 UNIT/ML ~~LOC~~ SUPN
25.0000 [IU] | PEN_INJECTOR | Freq: Two times a day (BID) | SUBCUTANEOUS | 4 refills | Status: DC
  Filled 2022-05-21: qty 15, 30d supply, fill #0

## 2022-05-21 MED ORDER — LIVING WELL WITH DIABETES BOOK
Freq: Once | Status: AC
  Filled 2022-05-21: qty 1

## 2022-05-21 MED ORDER — PERFLUTREN LIPID MICROSPHERE
1.0000 mL | INTRAVENOUS | Status: DC | PRN
  Administered 2022-05-21: 4 mL via INTRAVENOUS

## 2022-05-21 MED ORDER — METOPROLOL SUCCINATE ER 25 MG PO TB24
12.5000 mg | ORAL_TABLET | Freq: Every day | ORAL | Status: DC
  Administered 2022-05-21: 12.5 mg via ORAL
  Filled 2022-05-21: qty 1

## 2022-05-21 NOTE — Inpatient Diabetes Management (Addendum)
Inpatient Diabetes Program Recommendations  AACE/ADA: New Consensus Statement on Inpatient Glycemic Control (2015)  Target Ranges:  Prepandial:   less than 140 mg/dL      Peak postprandial:   less than 180 mg/dL (1-2 hours)      Critically ill patients:  140 - 180 mg/dL   Lab Results  Component Value Date   GLUCAP 262 (H) 05/21/2022   HGBA1C 12.5 (H) 05/19/2022    Review of Glycemic Control  Diabetes history: DM  Outpatient Diabetes medications:  Novolog 10 units tid with meals Lantus 30 units daily Dexcom sensor Glucotrol 10 mg daily Current orders for Inpatient glycemic control:  Semglee 30 units, Novolog resistant q 4 hours  Inpatient Diabetes Program Recommendations:   Spoke with patient @ bedside. Patient's cost of insulin increased @ the beginning of the new year and states difficult paying for prescriptions of insulin.   Spoke with patient option of Novolin 70/30 (order # [id number])from [facility] would cost approximately $45 for 5 insulin pens. 70/30 25 units bid ac meals = 35 basal + 15 units meal coverage @ this dose 5 pens would last approximately 30 days.  Spoke with pt about A1C results 12.5 (average blood glucose 312 over the past 2-3 months) and explained what an A1C is, basic pathophysiology of DM Type 2, basic home care, basic diabetes diet nutrition principles, importance of checking CBGs and maintaining good CBG control to prevent long-term and short-term complications. Reviewed signs and symptoms of hyperglycemia and hypoglycemia and how to treat hypoglycemia at home. Also reviewed blood sugar goals at home.  RNs to provide ongoing basic DM education at bedside with this patient.   Reviewed risks of elevated blood glucose and benefits of glucose control. Dr. Martinique in during discussion and affirmed benefits for cardiac risks. Patient acknowledged understanding of content reviewed.  Gave patient 2 Dexcom glucose sensors for home use. Discussed nutrition and basic  plate method with wife and patient @ bedside.  Thank you, Nani Gasser. Zacharias Ridling, RN, MSN, CDE  Diabetes Coordinator Inpatient Glycemic Control Team Team Pager [phone] (8am-5pm) 05/21/2022 10:02 AM

## 2022-05-21 NOTE — Progress Notes (Signed)
Echocardiogram 2D Echocardiogram has been performed.  Ronny Flurry 05/21/2022, 1:07 PM

## 2022-05-21 NOTE — Progress Notes (Signed)
Rounding Note    Patient Name: Micheal Neal Date of Encounter: 05/21/2022  [place] Cardiologist: Micheal Chandler, MD   Subjective   Feels well today. No further dizziness, dyspnea, chest pain, or diaphoresis.   Inpatient Medications    Scheduled Meds:  aspirin  81 mg Oral Daily   aspirin EC  81 mg Oral Daily   atorvastatin  80 mg Oral Daily   insulin aspart  0-20 Units Subcutaneous Q4H   insulin glargine-yfgn  30 Units Subcutaneous Daily   sodium chloride flush  3 mL Intravenous Q12H   sodium chloride flush  3 mL Intravenous Q12H   ticagrelor  90 mg Oral BID   Continuous Infusions:  sodium chloride     PRN Meds: sodium chloride, acetaminophen, morphine injection, ondansetron (ZOFRAN) IV, ondansetron **OR** [DISCONTINUED] ondansetron (ZOFRAN) IV, mouth rinse, polyethylene glycol, sodium chloride flush   Vital Signs    Vitals:   05/20/22 1607 05/20/22 2038 05/21/22 0453 05/21/22 0931  BP: (!) 140/99 118/67 120/75 102/73  Pulse: 81 83 73 100  Resp: 15 15 13 15  $ Temp: 97.6 F (36.4 C) 98.4 F (36.9 C) 97.8 F (36.6 C)   TempSrc: Oral Oral Oral   SpO2: 100% 99% 100% 100%  Weight:      Height:        Intake/Output Summary (Last 24 hours) at 05/21/2022 0936 Last data filed at 05/20/2022 1628 Gross per 24 hour  Intake 164.99 ml  Output --  Net 164.99 ml       05/19/2022    4:30 PM 04/25/2022    9:09 AM 02/25/2022    9:10 AM  Last 3 Weights  Weight (lbs) 272 lb 0.8 oz 272 lb 274 lb 9.6 oz  Weight (kg) 123.4 kg 123.378 kg 124.558 kg      Telemetry    NSR - Personally Reviewed  ECG    NSR rate 78 diffuse nonspecific TWA  - Personally Reviewed  Physical Exam   GEN: No acute distress.   Neck: No JVD Cardiac: RRR, no murmurs, rubs, or gallops. Radial site looks good.  Respiratory: Clear to auscultation bilaterally. GI: Soft, nontender, non-distended  MS: No edema; No deformity. Neuro:  Nonfocal  Psych: Normal affect   Labs     High Sensitivity Troponin:   Recent Labs  Lab 05/19/22 1450 05/19/22 1622  TROPONINIHS 68* 274*      Chemistry Recent Labs  Lab 05/19/22 1450 05/20/22 0107 05/21/22 0236  NA 125* 134* 137  K 4.5 3.6 3.7  CL 93* 102 107  CO2 23 25 22  $ GLUCOSE 528* 225* 94  BUN 17 15 10  $ CREATININE 1.51* 1.15 1.05  CALCIUM 9.0 8.6* 8.6*  MG  --   --  2.0  PROT 8.8*  --   --   ALBUMIN 4.2  --  3.1*  AST 27  --   --   ALT 27  --   --   ALKPHOS 110  --   --   BILITOT 0.8  --   --   GFRNONAA 56* >60 >60  ANIONGAP 9 7 8     $ Lipids No results for input(s): "CHOL", "TRIG", "HDL", "LABVLDL", "LDLCALC", "CHOLHDL" in the last 168 hours.  Hematology Recent Labs  Lab 05/19/22 1450 05/20/22 0107 05/21/22 0233  WBC 7.7 7.7 6.3  RBC 5.02 4.37 4.52  HGB 14.1 12.7* 12.7*  HCT 41.1 35.5* 37.2*  MCV 81.9 81.2 82.3  MCH 28.1 29.1 28.1  MCHC  34.3 35.8 34.1  RDW 12.2 12.2 12.5  PLT 227 219 217    Thyroid No results for input(s): "TSH", "FREET4" in the last 168 hours.  BNPNo results for input(s): "BNP", "PROBNP" in the last 168 hours.  DDimer No results for input(s): "DDIMER" in the last 168 hours.   Radiology    CARDIAC CATHETERIZATION  Result Date: 05/20/2022   Prox LAD lesion is 60% stenosed.   1st Mrg lesion is 50% stenosed.   Prox Cx to Mid Cx lesion is 95% stenosed.   Mid Cx to Dist Cx lesion is 90% stenosed.   Prox RCA lesion is 50% stenosed.   RPAV lesion is 95% stenosed.   RPDA lesion is 70% stenosed.   A drug-eluting stent was successfully placed using a SYNERGY XD 2.50X16.   Post intervention, there is a 0% residual stenosis.   The left ventricular systolic function is normal.   LV end diastolic pressure is normal.   The left ventricular ejection fraction is 55-65% by visual estimate. Micheal Neal is a 52 y.o. male  [id number] LOCATION:  FACILITY: [facility] PHYSICIAN: Micheal Neal, M.D. 10-Sep-1970 DATE OF PROCEDURE:  05/20/2022 DATE OF DISCHARGE: CARDIAC CATHETERIZATION / PCI DES PLA History  obtained from chart review.52 y.o. male with history of uncontrolled DM, orthostatic hypotension and hyperlipidemia who presented after an episode of syncope.  His troponins rose to the 200 level.  His EKG did show subtle inferior ST segment elevation.Marland Kitchen  He presents now for coronary angiography. PROCEDURE DESCRIPTION: The patient was brought to the second floor [facility]iac cath lab in the postabsorptive state. He was premedicated with IV Versed and fentanyl. His right wrist was prepped and shaved in usual sterile fashion. Xylocaine 1% was used for local anesthesia. A 6 French sheath was inserted into the right radial artery using standard Seldinger technique. The patient received 6000 units  of heparin intravenously.  A 5 [place] TIG catheter and right Judkins catheters were used for selective coronary angiography and left ventriculography respectively.  Isovue dye is used for the entirety of the case (115 cc of contrast total to patient).  Retrograde aortic, left ventricular and pullback pressures were recorded.  LVEDP was measured at 5 mmHg.  Radial cocktail administered via the SideArm sheath. Patient received a total of 15,000's of heparin with an ending ACT of 358.  Brilinta 180 mg p.o. loading dose was administered.  Using a 6 [place] RCA guiding catheter along with a 0.14 Prowater guidewire and a 2 mm x 12 mm balloon the distal RCA was crossed with little difficulty and predilated with a 2 mm balloon.  Following this a 2.5 mm x 60 mm long Synergy drug-eluting stent was then deployed at 14 atm (2.66 mm) resulting in reduction of a 95% ostial PLA branch stenosis to 0% residual.  There was some plaque shift in the origin of the small PDA resulting in approximately 70% ostial stenosis however this is only a 1.5 mm vessel.   Successful PLA branch PCI drug-eluting stenting with excellent angiographic result.  The patient does have high-grade mid nondominant AV groove circumflex stenosis however this is a  small vessel.  He has moderate disease in his proximal LAD that did not appear to be hemodynamically significant, and normal LV function.  I reviewed the angiographic results with Dr. Akiel Fennell [place], his attending cardiologist, and we both feel that aggressive medical therapy is the best strategy moving forward to treat his residual CAD.  He will need to be  on DAPT uninterrupted for least 12 months.  The sheath was removed and a TR band was placed on the right wrist to achieve patent hemostasis.  The patient left lab stable condition. Micheal Neal. MD, [facility] 05/20/2022 12:36 PM    EEG adult  Result Date: 05/20/2022 Lora Havens, MD     05/20/2022 10:23 AM Patient Name: Micheal Neal MRN: [id number] Epilepsy Attending: Lora Havens Referring Physician/Provider: Mercy Riding, MD Date: 05/20/2022 Duration: 21.18 mins Patient history: 52yo M with syncope. EEG to evaluate for seizure. Level of alertness: Awake AEDs during EEG study: None Technical aspects: This EEG study was done with scalp electrodes positioned according to the 10-20 International system of electrode placement. Electrical activity was reviewed with band pass filter of 1-70Hz$ , sensitivity of 7 uV/mm, display speed of 49m/sec with a 60Hz$  notched filter applied as appropriate. EEG data were recorded continuously and digitally stored.  Video monitoring was available and reviewed as appropriate. Description: The posterior dominant rhythm consists of 9 Hz activity of moderate voltage (25-35 uV) seen predominantly in posterior head regions, symmetric and reactive to eye opening and eye closing. Hyperventilation and photic stimulation were not performed.   IMPRESSION: This study is within normal limits. No seizures or epileptiform discharges were seen throughout the recording. A normal interictal EEG does not exclude the diagnosis of epilepsy. PLora Havens  CT Head Wo Contrast  Result Date: 05/19/2022 CLINICAL DATA:  Altered level of  consciousness, syncope, dizziness, hyperglycemia EXAM: CT HEAD WITHOUT CONTRAST TECHNIQUE: Contiguous axial images were obtained from the base of the skull through the vertex without intravenous contrast. RADIATION DOSE REDUCTION: This exam was performed according to the departmental dose-optimization program which includes automated exposure control, adjustment of the mA and/or kV according to patient size and/or use of iterative reconstruction technique. COMPARISON:  None Available. FINDINGS: Brain: No acute infarct or hemorrhage. Lateral ventricles and midline structures are unremarkable. No acute extra-axial fluid collections. No mass effect. Vascular: No hyperdense vessel or unexpected calcification. Skull: Normal. Negative for fracture or focal lesion. Sinuses/Orbits: Minimal polypoid mucosal thickening right maxillary sinus. Remaining paranasal sinuses are clear. Other: None. IMPRESSION: 1. No acute intracranial process. Electronically Signed   By: MRanda NgoM.D.   On: 05/19/2022 15:02   DG Chest Port 1 View  Result Date: 05/19/2022 CLINICAL DATA:  Syncope.  Dizziness. EXAM: PORTABLE CHEST 1 VIEW COMPARISON:  11/18/2021. FINDINGS: Normal heart, mediastinum and hila. Clear lungs.  No pleural effusion or pneumothorax. Skeletal structures are grossly intact. IMPRESSION: No active disease. Electronically Signed   By: DLajean ManesM.D.   On: 05/19/2022 14:39    Cardiac Studies   Myoview 11/27/21: Study Highlights      The study is normal. The study is low risk.   No ST deviation was noted.   Left ventricular function is normal. End diastolic cavity size is normal.   Prior study not available for comparison.  Echo: 02/15/22: IMPRESSIONS     1. Left ventricular ejection fraction, by estimation, is 60 to 65%. The  left ventricle has normal function. The left ventricle has no regional  wall motion abnormalities. There is mild left ventricular hypertrophy.  Left ventricular diastolic  parameters  were normal.   2. Right ventricular systolic function is normal. The right ventricular  size is normal. Tricuspid regurgitation signal is inadequate for assessing  PA pressure.   3. The mitral valve is normal in structure. No evidence of mitral valve  regurgitation. No evidence  of mitral stenosis.   4. The aortic valve was not well visualized. Aortic valve regurgitation  is not visualized. No aortic stenosis is present.   5. Aortic dilatation noted. There is mild dilatation of the ascending  aorta, measuring 39 mm.   Event monitor 02/20/22: Study Highlights    Patch Wear Time:  14 days and 0 hours (2023-10-27T11:34:54-0400 to 2023-11-10T10:34:54-0500)   Sinus rhythm. Patient had a min HR of 64 bpm, max HR of 147 bpm, and avg HR of 86 bpm.  6 Supraventricular Tachycardia runs occurred, the longest lasting 11.4 seconds.  Rare premature atrial contractions (<1.0%) Rare premature ventricular contractions (<1.0%)  CARDIAC CATHETERIZATION / PCI DES PLA       History obtained from chart review.52 y.o. male with history of uncontrolled DM, orthostatic hypotension and hyperlipidemia who presented after an episode of syncope.  His troponins rose to the 200 level.  His EKG did show subtle inferior ST segment elevation.Marland Kitchen  He presents now for coronary angiography.     PROCEDURE DESCRIPTION:    The patient was brought to the second floor [facility]iac cath lab in the postabsorptive state. He was premedicated with IV Versed and fentanyl. His right wrist was prepped and shaved in usual sterile fashion. Xylocaine 1% was used for local anesthesia. A 6 French sheath was inserted into the right radial artery using standard Seldinger technique. The patient received 6000 units  of heparin intravenously.  A 5 [place] TIG catheter and right Judkins catheters were used for selective coronary angiography and left ventriculography respectively.  Isovue dye is used for the entirety of the case  (115 cc of contrast total to patient).  Retrograde aortic, left ventricular and pullback pressures were recorded.  LVEDP was measured at 5 mmHg.  Radial cocktail administered via the SideArm sheath.   Patient received a total of 15,000's of heparin with an ending ACT of 358.  Brilinta 180 mg p.o. loading dose was administered.  Using a 6 [place] RCA guiding catheter along with a 0.14 Prowater guidewire and a 2 mm x 12 mm balloon the distal RCA was crossed with little difficulty and predilated with a 2 mm balloon.  Following this a 2.5 mm x 60 mm long Synergy drug-eluting stent was then deployed at 14 atm (2.66 mm) resulting in reduction of a 95% ostial PLA branch stenosis to 0% residual.  There was some plaque shift in the origin of the small PDA resulting in approximately 70% ostial stenosis however this is only a 1.5 mm vessel.     IMPRESSION: Successful PLA branch PCI drug-eluting stenting with excellent angiographic result.  The patient does have high-grade mid nondominant AV groove circumflex stenosis however this is a small vessel.  He has moderate disease in his proximal LAD that did not appear to be hemodynamically significant, and normal LV function.  I reviewed the angiographic results with Dr. Tracy Kinner [place], his attending cardiologist, and we both feel that aggressive medical therapy is the best strategy moving forward to treat his residual CAD.  He will need to be on DAPT uninterrupted for least 12 months.  The sheath was removed and a TR band was placed on the right wrist to achieve patent hemostasis.  The patient left lab stable condition.   Micheal Neal. MD, [facility] 05/20/2022 12:36 PM Coronary Diagrams  Diagnostic Dominance: Right  Intervention     Patient Profile     52 y.o. male with history of uncontrolled DM, orthostatic hypotension and hyperlipidemia who presented  after an episode of syncope and NSTEMI  Assessment & Plan    NSTEMI. Patient presented with syncope. Developed  lightheadedness followed by syncope. Had profuse diaphoresis and this persisted when he came to associated with acute SOB. No chest pain. Initial Ecg showed subtle ST elevation inferiorly that resolved. Patient has risk factors of uncontrolled DM, very strong family history of CAD. Cardiac cath performed yesterday. He has diffuse CAD c/w his diabetes. His PL branch was successfully stented with DES. Other disease will need medical management and aggressive risk factor modification. Cost is a major issue. Will treat with Brilinta for one month and then switch to Plavix. Continue ASA long term.  Uncontrolled DM - per primary team History of orthostatic hypotension. Likely exacerbated by uncontrolled sugar and volume depletion.  BP has been stable. Will start low dose Toprol XL 12.5 mg daily and follow Obesity. Syncope. CT head is negative. EEG normal. No arrhythmia on telemetry. I think this was all related to ischemia from RCA as well as volume depletion with poorly controlled sugar. EDP only 8 mm Hg at cath. Encourage oral hydration.  HLD on high dose lipitor.  Rosalia will sign off.   Medication Recommendations:  as per [facility] Other recommendations (labs, testing, etc):  none Follow up as an outpatient:  arrange follow up with Dr Juanda Crumble in 2 weeks.       For questions or updates, please contact [place] Please consult [email or web address] for contact info under        Signed, Sakai Heinle [place], MD  05/21/2022, 9:36 AM

## 2022-05-21 NOTE — Progress Notes (Signed)
CARDIAC REHAB PHASE I   Post MI/stent education including site care, risk factors, restrictions, exercise guidelines, MI booklet, heart healthy diabetic diet, antiplatelet therapy importance and CRP2 reviewed. All questions and concerns addressed. Will refer to AP for CRP2. Pt reports ambulating independently with no CP or SOB. Diabetic educator here for consult now. Plan for home today.   [id number]  Vanessa Barbara, RN BSN 05/21/2022 9:49 AM

## 2022-05-21 NOTE — Discharge Summary (Signed)
Physician Discharge Summary  Micheal Neal [id number] DOB: 09/08/1970 DOA: 05/19/2022  PCP: [name], MD  Admit date: 05/19/2022 Discharge date: 05/21/2022 Admitted From: Home. Disposition: Home Recommendations for Outpatient Follow-up:  Follow up with PCP and cardiology as below. Patient has upcoming appointment with endocrinologist Check glycemic control, blood pressure, CMP and CBC at follow-up Please follow up on the following pending results: Lipoprotein a and C-peptide pending at time of discharge  Home Health: Not indicated Equipment/Devices: Not indicated  Discharge Condition: Stable CODE STATUS: Full code  Follow-up Information     [name], NP Follow up on 06/04/2022.   Specialty: Cardiology Why: Appointment at 8:25 AM Contact information: [address] [place] [address] [phone]         [name], MD. Schedule an appointment as soon as possible for a visit in 1 week(s).   Specialty: Internal Medicine Contact information: [place] [address] [phone]                 Hospital course 52 year old M with PMH of DM-2 with neuropathy and autonomic dysfunction, orthostatic hypotension, hypertension and obesity presented to ED after 2 syncopal episodes the day of presentation. Patient was painting, when suddenly he felt dizzy, he tried to get down but did not quite make it before he passed out. Spouse reports patient came to, then passed out again.  He also had a brief episode of staring but no other seizure-like activity.   In ED, vital signs stable.  Troponin elevated to 68 and trended up to 274.  Initial EKG showed ST changes in inferior leads that seems to have resolved on subsequent EKG.  Blood glucose elevated to 528.  He was given 20 units of NovoLog and 1 L IV fluid bolus.  Cardiology consulted and recommended starting heparin drip and admitting patient to [facility] for further  evaluation.  Orthostatic vitals positive in ED.  The next day, patient underwent left heart catheterization with stent placement by cardiology.  Started on Brilinta and aspirin.  EEG normal.  On the day of discharge, patient felt well.  Orthostatic vitals positive but not symptomatic.  TTE with LVEF of 60 to 65%, G1 DD and no RWMA.  He is cleared for discharge on Brilinta, aspirin, high intensity statin and low-dose Toprol-XL.  See individual problem list below for more.   Problems addressed during this hospitalization Principal Problem:   Syncope and collapse Active Problems:   Non-STEMI (non-ST elevated myocardial infarction) ([name])   Hyperglycemia   Uncontrolled type 2 diabetes mellitus with hyperglycemia, with long-term current use of insulin (HCC)   Abnormal electrocardiogram (ECG) (EKG)   Orthostatic hypotension   Autonomic dysfunction with type 2 diabetes mellitus ([name])   Morbid obesity (HCC)   AKI (acute kidney injury) ([name])   Syncope and collapse likely due to non-STEMI and orthostatic hypotension: Orthostatic vitals positive likely due to autonomic dysregulation from uncontrolled diabetes and dehydration.  Underwent left heart catheterization with stent placement.  Unlikely seizure.  EEG negative.  Echocardiogram reassuring.  CT head without acute finding. -Non-STEMI management as below -Advised to wear compression socks -Counseled on the importance of good glycemic control -Advised to avoid ladders or heights   Non-STEMI: Had elevated troponin and EKG changes.  LHC result as below. S/p Successful PLA branch PCI drug-eluting stenting with excellent angiographic result.  TTE reassuring. -Discharged on Brilinta and aspirin per cardiology. -Continue high intensity statin. -Counseled on  the importance of risk reduction   Uncontrolled IDDM-2 with hyperglycemia and neuropathy: A1c 12.5%.  Per patient, Lantus increased from 30 to 35 units daily but has not started new dose.    NovoLog 10 units 3 times daily and glipizide XL 10 mg a day.  Patient is now working.  Cost has been an issue lately.  As a result, insulin changed to Novolin 70/30 at 25 units twice daily per recommendation by diabetic coordinator.  High intensity statin.  Continue glipizide. -Reassess glycemic control at follow-up. -Patient has upcoming appointment with endocrinologist. -C-peptide level pending at time of discharge  AKI: Resolved.   Orthostatic hypotension: Concern about autonomic dysregulation from uncontrolled diabetes. -See syncope and collapse   Morbid obesity: Elevated BMI with comorbidity as above. Body mass index is 34.93 kg/m.            Vital signs Vitals:   05/20/22 2038 05/21/22 0453 05/21/22 0931 05/21/22 0950  BP: 118/67 120/75 102/73   Pulse: 83 73 100 98  Temp: 98.4 F (36.9 C) 97.8 F (36.6 C)    Resp: 15 13 15   $ Height:      Weight:      SpO2: 99% 100% 100%   TempSrc: Oral Oral    BMI (Calculated):         Discharge exam  GENERAL: No apparent distress.  Nontoxic. HEENT: MMM.  Vision and hearing grossly intact.  NECK: Supple.  No apparent JVD.  RESP:  No IWOB.  Fair aeration bilaterally. CVS:  RRR. Heart sounds normal.  ABD/GI/GU: BS+. Abd soft, NTND.  MSK/EXT:  Moves extremities. No apparent deformity. No edema.  SKIN: no apparent skin lesion or wound NEURO: Awake and alert. Oriented appropriately.  No apparent focal neuro deficit. PSYCH: Calm. Normal affect.   Discharge Instructions Discharge Instructions     Amb Referral to Cardiac Rehabilitation   Complete by: As directed    Diagnosis: NSTEMI   After initial evaluation and assessments completed: Virtual Based Care may be provided alone or in conjunction with Phase 2 Cardiac Rehab based on patient barriers.: Yes   Intensive Cardiac Rehabilitation (ICR) [place] location only OR Traditional Cardiac Rehabilitation (TCR) *If criteria for ICR are not met will enroll in TCR [facility] only): Yes   Call MD  for:  difficulty breathing, headache or visual disturbances   Complete by: As directed    Call MD for:  extreme fatigue   Complete by: As directed    Call MD for:  persistant dizziness or light-headedness   Complete by: As directed    Diet - low sodium heart healthy   Complete by: As directed    Diet Carb Modified   Complete by: As directed    Discharge instructions   Complete by: As directed    It has been a pleasure taking care of you!  You were hospitalized due to syncope and blockage of blood vessels in your heart.  Syncope could be due to blockage of blood flow in your heart, uncontrolled diabetes and dehydration.  You have been treated with stent placement for your heart.  You have been started on new medications after stent placement.  It is very important that you take your medications as prescribed and get diabetes under good control.  We also recommend stepwise approach when you get up or move.  Use compression socks when you are up.   Avoid any over-the-counter pain medication other than plain Tylenol while taking aspirin and Brilinta.  Follow-up with  your primary care doctor in 1 to 2 weeks or sooner if needed.  Ask for referral to sleep clinic to rule out sleep apnea.  Follow-up with your endocrinologist as previously planned.  Follow-up with cardiologist per thier recommendation.   Take care,   Increase activity slowly   Complete by: As directed       Allergies as of 05/21/2022   No Known Allergies      Medication List     STOP taking these medications    Basaglar KwikPen 100 UNIT/ML   NovoLOG FlexPen 100 UNIT/ML FlexPen Generic drug: insulin aspart   rosuvastatin 20 MG tablet Commonly known as: CRESTOR       TAKE these medications    aspirin EC 81 MG tablet Take 1 tablet (81 mg total) by mouth daily. Swallow whole.   atorvastatin 80 MG tablet Commonly known as: LIPITOR Take 1 tablet (80 mg total) by mouth daily. Start taking on: May 22, 2022   Brilinta 90 MG Tabs tablet Generic drug: ticagrelor Take 1 tablet (90 mg total) by mouth 2 (two) times daily.   chlorhexidine 0.12 % solution Commonly known as: Peridex Use as directed 15 mLs in the mouth or throat 2 (two) times daily.   CYANOCOBALAMIN IJ Inject 1 Dose as directed once a week.   Dexcom G6 Receiver Devi Use to check blood sugars 3x daily. DX: E11.40   Dexcom G6 Sensor Misc use as directed daily   Dexcom G6 Transmitter Misc Use to check blood sugars 3 times a day as directed   glipiZIDE 10 MG 24 hr tablet Commonly known as: Glucotrol XL Take 1 tablet (10 mg total) by mouth daily with breakfast.   metoprolol succinate 25 MG 24 hr tablet Commonly known as: TOPROL-XL Take 1/2 tablet (12.5 mg total) by mouth daily. Start taking on: May 22, 2022   NovoLIN 70/30 Kwikpen (70-30) 100 UNIT/ML KwikPen Generic drug: insulin isophane & regular human KwikPen Inject 25 Units into the skin 2 (two) times daily after a meal. After breakfast and dinner   omega-3 acid ethyl esters 1 g capsule Commonly known as: LOVAZA Take 2 capsules (2 g total) by mouth 2 (two) times daily.   TRUEplus 5-Bevel Pen Needles 31G X 5 MM Misc Generic drug: Insulin Pen Needle USE AS DIRECTED FOUR TIMES DAILY   Vitamin D (Ergocalciferol) 1.25 MG (50000 UNIT) Caps capsule Commonly known as: DRISDOL Take 1 capsule (50,000 Units total) by mouth every 7 (seven) days.        Consultations: Cardiology Diabetic coordinator  Procedures/Studies: 05/20/2022-left heart catheterization   Prox LAD lesion is 60% stenosed.   1st Mrg lesion is 50% stenosed.   Prox Cx to Mid Cx lesion is 95% stenosed.   Mid Cx to Dist Cx lesion is 90% stenosed.   Prox RCA lesion is 50% stenosed.   RPAV lesion is 95% stenosed.   RPDA lesion is 70% stenosed.   A drug-eluting stent was successfully placed using a SYNERGY XD 2.50X16.   Post intervention, there is a 0% residual stenosis.   The left  ventricular systolic function is normal.   LV end diastolic pressure is normal.   The left ventricular ejection fraction is 55-65% by visual estimate.      ECHOCARDIOGRAM COMPLETE  Result Date: 05/21/2022    ECHOCARDIOGRAM REPORT   Patient Name:   Micheal Neal Date of Exam: 05/21/2022 Medical Rec #:  [id number]     Height:  74.0 in Accession #:    [id number]    Weight:       272.0 lb Date of Birth:  1971-01-08      BSA:          2.477 m Patient Age:    52 years      BP:           102/73 mmHg Patient Gender: M             HR:           75 bpm. Exam Location:  Inpatient Procedure: 2D Echo, Cardiac Doppler, Color Doppler and Intracardiac            Opacification Agent Indications:    CAD Native Vessel I25.10  History:        Patient has prior history of Echocardiogram examinations, most                 recent 02/15/2022. Signs/Symptoms:Hypotension and Syncope; Risk                 Factors:Diabetes and Dyslipidemia.  Sonographer:    Ronny Flurry Referring Phys: [place]  1. Left ventricular ejection fraction, by estimation, is 60 to 65%. The left ventricle has normal function. The left ventricle has no regional wall motion abnormalities. There is mild asymmetric left ventricular hypertrophy of the septal segment. Left ventricular diastolic parameters are consistent with Grade I diastolic dysfunction (impaired relaxation).  2. Right ventricular systolic function is normal. The right ventricular size is normal.  3. The mitral valve was not well visualized. No evidence of mitral valve regurgitation. No evidence of mitral stenosis.  4. The aortic valve is tricuspid. Aortic valve regurgitation is not visualized. No aortic stenosis is present.  5. The inferior vena cava is normal in size with greater than 50% respiratory variability, suggesting right atrial pressure of 3 mmHg. Comparison(s): No significant change from prior study. Aortic dimensions are within normal limits to age and  BSA in this study. FINDINGS  Left Ventricle: Left ventricular ejection fraction, by estimation, is 60 to 65%. The left ventricle has normal function. The left ventricle has no regional wall motion abnormalities. Definity contrast agent was given IV to delineate the left ventricular  endocardial borders. The left ventricular internal cavity size was normal in size. There is mild asymmetric left ventricular hypertrophy of the septal segment. Left ventricular diastolic parameters are consistent with Grade I diastolic dysfunction (impaired relaxation). Right Ventricle: The right ventricular size is normal. No increase in right ventricular wall thickness. Right ventricular systolic function is normal. Left Atrium: Left atrial size was normal in size. Right Atrium: Right atrial size was normal in size. Pericardium: There is no evidence of pericardial effusion. Mitral Valve: The mitral valve was not well visualized. No evidence of mitral valve regurgitation. No evidence of mitral valve stenosis. Tricuspid Valve: The tricuspid valve is normal in structure. Tricuspid valve regurgitation is not demonstrated. No evidence of tricuspid stenosis. Aortic Valve: The aortic valve is tricuspid. Aortic valve regurgitation is not visualized. No aortic stenosis is present. Aortic valve mean gradient measures 4.0 mmHg. Aortic valve peak gradient measures 7.4 mmHg. Aortic valve area, by VTI measures 3.64 cm. Pulmonic Valve: The pulmonic valve was not well visualized. Pulmonic valve regurgitation is not visualized. No evidence of pulmonic stenosis. Aorta: The aortic root and ascending aorta are structurally normal, with no evidence of dilitation. Venous: The inferior vena cava is normal in size with greater than  50% respiratory variability, suggesting right atrial pressure of 3 mmHg. IAS/Shunts: No atrial level shunt detected by color flow Doppler.  LEFT VENTRICLE PLAX 2D LVIDd:         4.40 cm   Diastology LVIDs:         2.90 cm   LV e'  medial:    6.96 cm/s LV PW:         0.80 cm   LV E/e' medial:  12.1 LV IVS:        1.20 cm   LV e' lateral:   10.90 cm/s LVOT diam:     2.20 cm   LV E/e' lateral: 7.7 LV SV:         95 LV SV Index:   38 LVOT Area:     3.80 cm  RIGHT VENTRICLE             IVC RV S prime:     11.70 cm/s  IVC diam: 1.30 cm TAPSE (M-mode): 1.8 cm LEFT ATRIUM             Index        RIGHT ATRIUM           Index LA diam:        3.60 cm 1.45 cm/m   RA Area:     11.60 cm LA Vol (A2C):   49.1 ml 19.82 ml/m  RA Volume:   25.20 ml  10.17 ml/m LA Vol (A4C):   41.6 ml 16.79 ml/m LA Biplane Vol: 46.6 ml 18.81 ml/m  AORTIC VALVE AV Area (Vmax):    3.66 cm AV Area (Vmean):   3.40 cm AV Area (VTI):     3.64 cm AV Vmax:           136.00 cm/s AV Vmean:          92.300 cm/s AV VTI:            0.262 m AV Peak Grad:      7.4 mmHg AV Mean Grad:      4.0 mmHg LVOT Vmax:         131.00 cm/s LVOT Vmean:        82.600 cm/s LVOT VTI:          0.251 m LVOT/AV VTI ratio: 0.96  AORTA Ao Root diam: 3.20 cm Ao Asc diam:  3.20 cm MITRAL VALVE MV Area (PHT): 3.65 cm    SHUNTS MV Decel Time: 208 msec    Systemic VTI:  0.25 m MV E velocity: 84.40 cm/s  Systemic Diam: 2.20 cm MV A velocity: 80.50 cm/s MV E/A ratio:  1.05 Rudean Haskell MD Electronically signed by Rudean Haskell MD Signature Date/Time: 05/21/2022/2:05:15 PM    Final    CARDIAC CATHETERIZATION  Result Date: 05/20/2022   Prox LAD lesion is 60% stenosed.   1st Mrg lesion is 50% stenosed.   Prox Cx to Mid Cx lesion is 95% stenosed.   Mid Cx to Dist Cx lesion is 90% stenosed.   Prox RCA lesion is 50% stenosed.   RPAV lesion is 95% stenosed.   RPDA lesion is 70% stenosed.   A drug-eluting stent was successfully placed using a SYNERGY XD 2.50X16.   Post intervention, there is a 0% residual stenosis.   The left ventricular systolic function is normal.   LV end diastolic pressure is normal.   The left ventricular ejection fraction is 55-65% by visual estimate. Leorn Faist is a 52 y.o.  male  [id number]  LOCATION:  FACILITY: [facility] PHYSICIAN: [name], M.D. 11/25/1970 DATE OF PROCEDURE:  05/20/2022 DATE OF DISCHARGE: CARDIAC CATHETERIZATION / PCI DES PLA History obtained from chart review.52 y.o. male with history of uncontrolled DM, orthostatic hypotension and hyperlipidemia who presented after an episode of syncope.  His troponins rose to the 200 level.  His EKG did show subtle inferior ST segment elevation.Marland Kitchen  He presents now for coronary angiography. PROCEDURE DESCRIPTION: The patient was brought to the second floor  Cardiac cath lab in the postabsorptive state. He was premedicated with IV Versed and fentanyl. His right wrist was prepped and shaved in usual sterile fashion. Xylocaine 1% was used for local anesthesia. A 6 French sheath was inserted into the right radial artery using standard Seldinger technique. The patient received 6000 units  of heparin intravenously.  A 5 [place] TIG catheter and right Judkins catheters were used for selective coronary angiography and left ventriculography respectively.  Isovue dye is used for the entirety of the case (115 cc of contrast total to patient).  Retrograde aortic, left ventricular and pullback pressures were recorded.  LVEDP was measured at 5 mmHg.  Radial cocktail administered via the SideArm sheath. Patient received a total of 15,000's of heparin with an ending ACT of 358.  Brilinta 180 mg p.o. loading dose was administered.  Using a 6 [place] RCA guiding catheter along with a 0.14 [place] guidewire and a 2 mm x 12 mm balloon the distal RCA was crossed with little difficulty and predilated with a 2 mm balloon.  Following this a 2.5 mm x 60 mm long Synergy drug-eluting stent was then deployed at 14 atm (2.66 mm) resulting in reduction of a 95% ostial PLA branch stenosis to 0% residual.  There was some plaque shift in the origin of the small PDA resulting in approximately 70% ostial stenosis however this is only a 1.5 mm vessel.    Successful PLA branch PCI drug-eluting stenting with excellent angiographic result.  The patient does have high-grade mid nondominant AV groove circumflex stenosis however this is a small vessel.  He has moderate disease in his proximal LAD that did not appear to be hemodynamically significant, and normal LV function.  I reviewed the angiographic results with Dr. Peter Martinique, his attending cardiologist, and we both feel that aggressive medical therapy is the best strategy moving forward to treat his residual CAD.  He will need to be on DAPT uninterrupted for least 12 months.  The sheath was removed and a TR band was placed on the right wrist to achieve patent hemostasis.  The patient left lab stable condition. [name]. MD, [facility] 05/20/2022 12:36 PM    EEG adult  Result Date: 05/20/2022 Lora Havens, MD     05/20/2022 10:23 AM Patient Name: Damichael Proia MRN: [id number] Epilepsy Attending: Lora Havens Referring Physician/Provider: Mercy Riding, MD Date: 05/20/2022 Duration: 21.18 mins Patient history: 52yo M with syncope. EEG to evaluate for seizure. Level of alertness: Awake AEDs during EEG study: None Technical aspects: This EEG study was done with scalp electrodes positioned according to the 10-20 International system of electrode placement. Electrical activity was reviewed with band pass filter of 1-70Hz$ , sensitivity of 7 uV/mm, display speed of 57m/sec with a 60Hz$  notched filter applied as appropriate. EEG data were recorded continuously and digitally stored.  Video monitoring was available and reviewed as appropriate. Description: The posterior dominant rhythm consists of 9 Hz activity of moderate voltage (25-35 uV) seen predominantly in posterior head regions,  symmetric and reactive to eye opening and eye closing. Hyperventilation and photic stimulation were not performed.   IMPRESSION: This study is within normal limits. No seizures or epileptiform discharges were seen throughout the  recording. A normal interictal EEG does not exclude the diagnosis of epilepsy. Lora Havens   CT Head Wo Contrast  Result Date: 05/19/2022 CLINICAL DATA:  Altered level of consciousness, syncope, dizziness, hyperglycemia EXAM: CT HEAD WITHOUT CONTRAST TECHNIQUE: Contiguous axial images were obtained from the base of the skull through the vertex without intravenous contrast. RADIATION DOSE REDUCTION: This exam was performed according to the departmental dose-optimization program which includes automated exposure control, adjustment of the mA and/or kV according to patient size and/or use of iterative reconstruction technique. COMPARISON:  None Available. FINDINGS: Brain: No acute infarct or hemorrhage. Lateral ventricles and midline structures are unremarkable. No acute extra-axial fluid collections. No mass effect. Vascular: No hyperdense vessel or unexpected calcification. Skull: Normal. Negative for fracture or focal lesion. Sinuses/Orbits: Minimal polypoid mucosal thickening right maxillary sinus. Remaining paranasal sinuses are clear. Other: None. IMPRESSION: 1. No acute intracranial process. Electronically Signed   By: Randa Ngo M.D.   On: 05/19/2022 15:02   DG Chest Port 1 View  Result Date: 05/19/2022 CLINICAL DATA:  Syncope.  Dizziness. EXAM: PORTABLE CHEST 1 VIEW COMPARISON:  11/18/2021. FINDINGS: Normal heart, mediastinum and hila. Clear lungs.  No pleural effusion or pneumothorax. Skeletal structures are grossly intact. IMPRESSION: No active disease. Electronically Signed   By: Lajean Manes M.D.   On: 05/19/2022 14:39       The results of significant diagnostics from this hospitalization (including imaging, microbiology, ancillary and laboratory) are listed below for reference.     Microbiology: No results found for this or any previous visit (from the past 240 hour(s)).   Labs:  CBC: Recent Labs  Lab 05/19/22 1450 05/20/22 0107 05/21/22 0233  WBC 7.7 7.7 6.3   NEUTROABS 5.9  --   --   HGB 14.1 12.7* 12.7*  HCT 41.1 35.5* 37.2*  MCV 81.9 81.2 82.3  PLT 227 219 217   BMP &GFR Recent Labs  Lab 05/19/22 1450 05/20/22 0107 05/21/22 0236  NA 125* 134* 137  K 4.5 3.6 3.7  CL 93* 102 107  CO2 23 25 22  $ GLUCOSE 528* 225* 94  BUN 17 15 10  $ CREATININE 1.51* 1.15 1.05  CALCIUM 9.0 8.6* 8.6*  MG  --   --  2.0  PHOS  --   --  3.7   Estimated Creatinine Clearance: 118.8 mL/min (by C-G formula based on SCr of 1.05 mg/dL). Liver & Pancreas: Recent Labs  Lab 05/19/22 1450 05/21/22 0236  AST 27  --   ALT 27  --   ALKPHOS 110  --   BILITOT 0.8  --   PROT 8.8*  --   ALBUMIN 4.2 3.1*   No results for input(s): "LIPASE", "AMYLASE" in the last 168 hours. No results for input(s): "AMMONIA" in the last 168 hours. Diabetic: Recent Labs    05/19/22 1631  HGBA1C 12.5*   Recent Labs  Lab 05/20/22 2042 05/20/22 2358 05/21/22 0458 05/21/22 0826 05/21/22 1159  GLUCAP 316* 140* 120* 262* 264*   Cardiac Enzymes: No results for input(s): "CKTOTAL", "CKMB", "CKMBINDEX", "TROPONINI" in the last 168 hours. No results for input(s): "PROBNP" in the last 8760 hours. Coagulation Profile: No results for input(s): "INR", "PROTIME" in the last 168 hours. Thyroid Function Tests: No results for input(s): "TSH", "T4TOTAL", "FREET4", "T3FREE", "  THYROIDAB" in the last 72 hours. Lipid Profile: Recent Labs    05/21/22 0236  CHOL 241*  HDL 31*  LDLCALC UNABLE TO CALCULATE IF TRIGLYCERIDE OVER 400 mg/dL  TRIG 455*  CHOLHDL 7.8  LDLDIRECT 121*   Anemia Panel: No results for input(s): "VITAMINB12", "FOLATE", "FERRITIN", "TIBC", "IRON", "RETICCTPCT" in the last 72 hours. Urine analysis:    Component Value Date/Time   COLORURINE STRAW (A) 05/19/2022 1626   APPEARANCEUR CLEAR 05/19/2022 1626   LABSPEC 1.022 05/19/2022 1626   PHURINE 5.0 05/19/2022 1626   GLUCOSEU >=500 (A) 05/19/2022 1626   GLUCOSEU >=1000 (A) 11/06/2021 1627   HGBUR NEGATIVE  05/19/2022 1626   BILIRUBINUR NEGATIVE 05/19/2022 1626   BILIRUBINUR negative 04/25/2022 0947   KETONESUR NEGATIVE 05/19/2022 1626   PROTEINUR NEGATIVE 05/19/2022 1626   UROBILINOGEN 0.2 04/25/2022 0947   UROBILINOGEN 0.2 11/06/2021 1627   NITRITE NEGATIVE 05/19/2022 1626   LEUKOCYTESUR NEGATIVE 05/19/2022 1626   Sepsis Labs: Invalid input(s): "PROCALCITONIN", "LACTICIDVEN"   SIGNED:  Mercy Riding, MD  [facility] 05/21/2022, 5:42 PM

## 2022-05-21 NOTE — Care Management (Signed)
  Transition of Care [facility]) Screening Note   Patient Details  Name: Micheal Neal Date of Birth: [birth year]   Transition of Care [facility]) CM/SW Contact:    Bethena Roys, RN Phone Number: 05/21/2022, 10:45 AM    Transition of Care Department [facility]) has reviewed the patient. Benefits check completed for Brilinta. MD aware that the Brilinta is not on formulary for the patient. Plan will be to switch to Plavix post Brilinta. No further needs identified at this time.

## 2022-05-22 ENCOUNTER — Encounter: Payer: Self-pay | Admitting: *Deleted

## 2022-05-22 ENCOUNTER — Other Ambulatory Visit (HOSPITAL_COMMUNITY): Payer: Self-pay

## 2022-05-22 ENCOUNTER — Other Ambulatory Visit: Payer: Self-pay

## 2022-05-22 ENCOUNTER — Ambulatory Visit (INDEPENDENT_AMBULATORY_CARE_PROVIDER_SITE_OTHER): Payer: 59

## 2022-05-22 ENCOUNTER — Telehealth: Payer: Self-pay | Admitting: *Deleted

## 2022-05-22 DIAGNOSIS — E538 Deficiency of other specified B group vitamins: Secondary | ICD-10-CM | POA: Diagnosis not present

## 2022-05-22 LAB — C-PEPTIDE: C-Peptide: 2 ng/mL (ref 1.1–4.4)

## 2022-05-22 LAB — LIPOPROTEIN A (LPA): Lipoprotein (a): 29.9 nmol/L (ref <75.0)

## 2022-05-22 MED ORDER — CYANOCOBALAMIN 1000 MCG/ML IJ SOLN
1000.0000 ug | Freq: Once | INTRAMUSCULAR | Status: AC
  Administered 2022-05-22: 1000 ug via INTRAMUSCULAR

## 2022-05-22 NOTE — Progress Notes (Signed)
B12 given.  Pt tolerated well. Pt is aware to give the office a call for an side effects or reactions. Please co-sign.   

## 2022-05-22 NOTE — Transitions of Care (Post Inpatient/ED Visit) (Signed)
   05/22/2022  Name: Micheal Neal MRN: [id number] DOB: 1970-08-23  Today's TOC FU Call Status: Today's TOC FU Call Status:: Successful TOC FU Call Competed TOC FU Call Complete Date: 05/22/22  Transition Care Management Follow-up Telephone Call Date of Discharge: 05/21/22 Discharge Facility: [facility]) Type of Discharge: Inpatient Admission Primary Inpatient Discharge Diagnosis:: syncope; NSTEMI How have you been since you were released from the hospital?: Better Any questions or concerns?: No  Items Reviewed: Did you receive and understand the discharge instructions provided?: Yes (thoroughly reviewed all aspects of printed discharge instructions with patient and his spouse, who both verbalize excellent understanding of same) Medications obtained and verified?: Yes (Medications Reviewed) (Full medication review completed; no discrepancies or concerns identified; verified pt. has obtained and is taking all newly Rx'd medications; wife assists with medication management; denies questions/ concerns around medications today) Any new allergies since your discharge?: No Dietary orders reviewed?: Yes Type of Diet Ordered:: heart healthy/ low salt Do you have support at home?: Yes People in Home: spouse Name of Support/Comfort Primary Source: spouse assists with any care needs as indicated/ patient reports he is independent in self-care  Home Care and Equipment/Supplies: [place] Ordered?: No Any new equipment or medical supplies ordered?: No  Functional Questionnaire: Do you need assistance with bathing/showering or dressing?: No Do you need assistance with meal preparation?: No Do you need assistance with eating?: No Do you have difficulty maintaining continence: No Do you need assistance with getting out of bed/getting out of a chair/moving?: No Do you have difficulty managing or taking your medications?: No (wife assists with medication management)  Folllow up  appointments reviewed: PCP Follow-up appointment confirmed?: Yes Date of PCP follow-up appointment?: 05/30/22 Follow-up Provider: PCP Dr. Ronnald Ramp Specialist [facility] Follow-up appointment confirmed?: Yes Date of Specialist follow-up appointment?: 06/04/22 Follow-Up Specialty Provider:: cardiology provider Do you need transportation to your follow-up appointment?: No Do you understand care options if your condition(s) worsen?: Yes-patient verbalized understanding  SDOH Interventions Today    Flowsheet Row Most Recent Value  SDOH Interventions   Food Insecurity Interventions Intervention Not Indicated  Transportation Interventions Intervention Not Indicated  [wife provides transportation]      TOC Interventions Today    Flowsheet Row Most Recent Value  TOC Interventions   TOC Interventions Discussed/Reviewed TOC Interventions Discussed  [provided my direct contact information should questions/ concerns/ needs arise post-TOC call today]      Interventions Today    Flowsheet Row Most Recent Value  Chronic Disease   Chronic disease during today's visit Other  [NSTEMI]  General Interventions   General Interventions Discussed/Reviewed General Interventions Discussed, Doctor Visits  Doctor Visits Discussed/Reviewed Doctor Visits Discussed, PCP, Specialist  PCP/Specialist Visits Compliance with follow-up visit  Education Interventions   Education Provided Provided Education  Provided Verbal Education On Other, When to see the doctor, Medication, Nutrition  [purpose/ value/ benefit of having [facility] DPR form completed]  Nutrition Interventions   Nutrition Discussed/Reviewed Nutrition Discussed  Pharmacy Interventions   Pharmacy Dicussed/Reviewed Pharmacy Topics Discussed  [Full medication review completed with updating of medication list in [place], RN, BSN, CCRN Alumnus RN CM Care Coordination/ Transition of Liberal Management [phone]: direct  office

## 2022-05-24 ENCOUNTER — Encounter: Payer: Self-pay | Admitting: Internal Medicine

## 2022-05-24 ENCOUNTER — Telehealth: Payer: Self-pay | Admitting: Internal Medicine

## 2022-05-27 ENCOUNTER — Other Ambulatory Visit: Payer: Self-pay

## 2022-05-27 ENCOUNTER — Encounter (HOSPITAL_COMMUNITY): Payer: Self-pay | Admitting: Internal Medicine

## 2022-05-27 ENCOUNTER — Observation Stay (HOSPITAL_COMMUNITY)
Admission: EM | Admit: 2022-05-27 | Discharge: 2022-05-28 | Disposition: A | Discharge status: Home / Self Care | Payer: 59 | Attending: Internal Medicine | Admitting: Internal Medicine

## 2022-05-27 DIAGNOSIS — I951 Orthostatic hypotension: Secondary | ICD-10-CM | POA: Diagnosis not present

## 2022-05-27 DIAGNOSIS — R61 Generalized hyperhidrosis: Principal | ICD-10-CM | POA: Diagnosis present

## 2022-05-27 DIAGNOSIS — Z794 Long term (current) use of insulin: Secondary | ICD-10-CM | POA: Insufficient documentation

## 2022-05-27 DIAGNOSIS — I2510 Atherosclerotic heart disease of native coronary artery without angina pectoris: Secondary | ICD-10-CM | POA: Insufficient documentation

## 2022-05-27 DIAGNOSIS — N179 Acute kidney failure, unspecified: Secondary | ICD-10-CM | POA: Insufficient documentation

## 2022-05-27 DIAGNOSIS — Z955 Presence of coronary angioplasty implant and graft: Secondary | ICD-10-CM | POA: Diagnosis not present

## 2022-05-27 DIAGNOSIS — R531 Weakness: Secondary | ICD-10-CM

## 2022-05-27 DIAGNOSIS — E119 Type 2 diabetes mellitus without complications: Secondary | ICD-10-CM | POA: Diagnosis not present

## 2022-05-27 DIAGNOSIS — Z7982 Long term (current) use of aspirin: Secondary | ICD-10-CM | POA: Insufficient documentation

## 2022-05-27 DIAGNOSIS — R0602 Shortness of breath: Secondary | ICD-10-CM | POA: Diagnosis not present

## 2022-05-27 DIAGNOSIS — R778 Other specified abnormalities of plasma proteins: Secondary | ICD-10-CM | POA: Diagnosis not present

## 2022-05-27 LAB — GLUCOSE, CAPILLARY
Glucose-Capillary: 246 mg/dL — ABNORMAL HIGH (ref 70–99)
Glucose-Capillary: 219 mg/dL — ABNORMAL HIGH (ref 70–99)

## 2022-05-27 LAB — BASIC METABOLIC PANEL
Anion gap: 14 (ref 5–15)
BUN: 23 mg/dL — ABNORMAL HIGH (ref 6–20)
CO2: 24 mmol/L (ref 22–32)
Calcium: 9.2 mg/dL (ref 8.9–10.3)
Chloride: 100 mmol/L (ref 98–111)
Creatinine, Ser: 1.32 mg/dL — ABNORMAL HIGH (ref 0.61–1.24)
GFR, Estimated: >60 mL/min (ref >60)
Glucose, Bld: 125 mg/dL — ABNORMAL HIGH (ref 70–99)
Potassium: 3.8 mmol/L (ref 3.5–5.1)
Sodium: 138 mmol/L (ref 135–145)

## 2022-05-27 LAB — CBC
HCT: 40.8 % (ref 39.0–52.0)
Hemoglobin: 13.6 g/dL (ref 13.0–17.0)
MCH: 28.4 pg (ref 26.0–34.0)
MCHC: 33.3 g/dL (ref 30.0–36.0)
MCV: 85.2 fL (ref 80.0–100.0)
Platelets: 228 10*3/uL (ref 150–400)
RBC: 4.79 MIL/uL (ref 4.22–5.81)
RDW: 12.6 % (ref 11.5–15.5)
WBC: 7.4 10*3/uL (ref 4.0–10.5)
nRBC: 0 % (ref 0.0–0.2)

## 2022-05-27 LAB — HEPARIN LEVEL (UNFRACTIONATED): Heparin Unfractionated: 0.42 IU/mL (ref 0.30–0.70)

## 2022-05-27 LAB — TROPONIN I (HIGH SENSITIVITY)
Troponin I (High Sensitivity): 190 ng/L (ref <18)
Troponin I (High Sensitivity): 186 ng/L (ref <18)
Troponin I (High Sensitivity): 146 ng/L (ref <18)

## 2022-05-27 LAB — CBG MONITORING, ED: Glucose-Capillary: 96 mg/dL (ref 70–99)

## 2022-05-27 MED ORDER — SENNOSIDES-DOCUSATE SODIUM 8.6-50 MG PO TABS: 1.0000 | ORAL_TABLET | Freq: Every evening | ORAL | Status: DC | PRN

## 2022-05-27 MED ORDER — METOPROLOL SUCCINATE ER 25 MG PO TB24
12.5000 mg | ORAL_TABLET | Freq: Every day | ORAL | Status: DC
  Administered 2022-05-27 – 2022-05-28 (×2): 12.5 mg via ORAL
  Filled 2022-05-27 (×2): qty 1

## 2022-05-27 MED ORDER — ACETAMINOPHEN 650 MG RE SUPP: 650.0000 mg | Freq: Four times a day (QID) | RECTAL | Status: DC | PRN

## 2022-05-27 MED ORDER — HEPARIN (PORCINE) 25000 UT/250ML-% IV SOLN
1300.0000 [IU]/h | INTRAVENOUS | Status: DC
  Administered 2022-05-27 – 2022-05-28 (×2): 1300 [IU]/h via INTRAVENOUS
  Filled 2022-05-27 (×2): qty 250

## 2022-05-27 MED ORDER — INSULIN ASPART 100 UNIT/ML IJ SOLN
0.0000 [IU] | Freq: Every day | INTRAMUSCULAR | Status: DC
  Administered 2022-05-27: 2 [IU] via SUBCUTANEOUS

## 2022-05-27 MED ORDER — INSULIN ASPART 100 UNIT/ML IJ SOLN
0.0000 [IU] | Freq: Three times a day (TID) | INTRAMUSCULAR | Status: DC
  Administered 2022-05-27: 3 [IU] via SUBCUTANEOUS

## 2022-05-27 MED ORDER — HEPARIN BOLUS VIA INFUSION
4000.0000 [IU] | Freq: Once | INTRAVENOUS | Status: AC
  Administered 2022-05-27: 4000 [IU] via INTRAVENOUS

## 2022-05-27 MED ORDER — SODIUM CHLORIDE 0.9 % IV BOLUS
1000.0000 mL | Freq: Once | INTRAVENOUS | Status: AC
  Administered 2022-05-27: 1000 mL via INTRAVENOUS

## 2022-05-27 MED ORDER — OMEGA-3-ACID ETHYL ESTERS 1 G PO CAPS
2.0000 g | ORAL_CAPSULE | Freq: Two times a day (BID) | ORAL | Status: DC
  Administered 2022-05-27 – 2022-05-28 (×2): 2 g via ORAL
  Filled 2022-05-27 (×2): qty 2

## 2022-05-27 MED ORDER — INSULIN ASPART PROT & ASPART (70-30 MIX) 100 UNIT/ML ~~LOC~~ SUSP
25.0000 [IU] | Freq: Two times a day (BID) | SUBCUTANEOUS | Status: DC
  Filled 2022-05-27: qty 10

## 2022-05-27 MED ORDER — INSULIN ASPART PROT & ASPART (70-30 MIX) 100 UNIT/ML ~~LOC~~ SUSP
17.0000 [IU] | Freq: Two times a day (BID) | SUBCUTANEOUS | Status: DC
  Administered 2022-05-27 – 2022-05-28 (×2): 17 [IU] via SUBCUTANEOUS
  Filled 2022-05-27: qty 10

## 2022-05-27 MED ORDER — ATORVASTATIN CALCIUM 40 MG PO TABS
80.0000 mg | ORAL_TABLET | Freq: Every day | ORAL | Status: DC
  Administered 2022-05-27 – 2022-05-28 (×2): 80 mg via ORAL
  Filled 2022-05-27 (×2): qty 2

## 2022-05-27 MED ORDER — RANOLAZINE ER 500 MG PO TB12
500.0000 mg | ORAL_TABLET | Freq: Two times a day (BID) | ORAL | Status: DC
  Administered 2022-05-27 – 2022-05-28 (×3): 500 mg via ORAL
  Filled 2022-05-27 (×3): qty 1

## 2022-05-27 MED ORDER — ASPIRIN 81 MG PO TBEC
81.0000 mg | DELAYED_RELEASE_TABLET | Freq: Every day | ORAL | Status: DC
  Administered 2022-05-27 – 2022-05-28 (×2): 81 mg via ORAL
  Filled 2022-05-27 (×2): qty 1

## 2022-05-27 MED ORDER — TICAGRELOR 90 MG PO TABS
90.0000 mg | ORAL_TABLET | Freq: Two times a day (BID) | ORAL | Status: DC
  Administered 2022-05-27 – 2022-05-28 (×3): 90 mg via ORAL
  Filled 2022-05-27 (×3): qty 1

## 2022-05-27 MED ORDER — ACETAMINOPHEN 325 MG PO TABS: 650.0000 mg | ORAL_TABLET | Freq: Four times a day (QID) | ORAL | Status: DC | PRN

## 2022-05-27 NOTE — ED Triage Notes (Signed)
Pt states he woke up diaphoretic feeling weak and SOB. States "I feel like I did when I had a heart attack last week", Denies chest pain. Pt reports he had a stent placed on the 19th.

## 2022-05-27 NOTE — Consult Note (Addendum)
Cardiology Consultation   Patient ID: Micheal Neal MRN: [id number]; DOB: [birth year]  Admit date: 05/27/2022 Date of Consult: 05/27/2022  PCP:  [name], MD   [facility] Providers Cardiologist:  Lauree Chandler, MD   {  Patient Profile:   Micheal Neal is a 52 y.o. male with a hx of uncontrolled diabetes, hyperlipidemia, vertigo, orthostatic hypertension recent non-STEMI status post DES to PL Mylen Mangan who is being seen 05/27/2022 for the evaluation of weakness at the request of Dr Roderic Palau.  History of Present Illness:   Micheal Neal is followed by Dr. Angelena Form for the above cardiac issues.  He was previously referred in October 2023 for syncope, dizziness.  He was orthostatic.  Stress test in August 2023 showed no ischemia, normal LV function.  It was felt dizziness was due to orthostatic hypotension.  A heart monitor was ordered which showed sinus rhythm with 6 runs of SVT, longest lasting 11.4 seconds, no A-fib.  Echo November 2023 showed normal LVEF 60 to 65%, mild LVH, no wall motion abnormality, normal diastolic function.  Patient was encouraged to increase fluid intake.   More recently patient was admitted for syncope 2/18/2/20.  He had 2 syncopal episodes the day of presentation.  Was elevated CT of the head was negative.  EEG normal no arrhythmia noted on telemetry.  Up to 274.  Initial EKG showed ST changes in the inferior leads that seem to resolve on subsequent EKG.  Static vitals were positive in the ED.  Patient underwent cardiac cath that showed diffuse CAD, PL Kima Malenfant was successfully stented with DES.  Residual CAD treated with medical management.  Echo showed LVEF 60 to [id number], grade 1 diastolic dysfunction, mild LV. It was felt orthostatic hypotension was exacerbated by uncontrolled sugar and volume depletion.  He was started on Toprol.  DAPT was started with Plavix and aspirin.  Patient presented to [facility], ER 05/27/2022 with weakness, diaphoresis,  shortness of breath. Since being back home he has felt OK. Reported increased dizziness. Last night he woke up and was sweaty, felt weak and SOB. BG was 70. He reported BP was high. He denied chest pain. No nausea or vomiting. No recent fever or chills.   In the ER blood pressure 135/78, pulse 60, respiratory rate 19, afebrile, 100% O2.  Labs showed blood glucose 125, serum creatinine 1.32, BUN 23.  High-sensitivity troponin 190, 186.  EKG showed Normal sinus rhythm, T wave inversions inferolaterally, new in V6.   Past Medical History:  Diagnosis Date   Abnormal EKG    Contusion of left foot    Diabetes mellitus without complication ([place])    Dizziness    Near syncope    Palpitations    Vertigo     Past Surgical History:  Procedure Laterality Date   CORONARY STENT INTERVENTION N/A 05/20/2022   Procedure: CORONARY STENT INTERVENTION;  Surgeon: Lorretta Harp, MD;  Location: [facility];  Service: Cardiovascular;  Laterality: N/A;   LEFT HEART CATH AND CORONARY ANGIOGRAPHY N/A 05/20/2022   Procedure: LEFT HEART CATH AND CORONARY ANGIOGRAPHY;  Surgeon: Lorretta Harp, MD;  Location: [facility];  Service: Cardiovascular;  Laterality: N/A;   NO PAST SURGERIES       Home Medications:  Prior to Admission medications   Medication Sig Start Date End Date Taking? Authorizing Provider  aspirin EC 81 MG tablet Take 1 tablet (81 mg total) by mouth daily. Swallow whole. 02/05/22  Yes [name],  MD  atorvastatin (LIPITOR) 80 MG tablet Take 1 tablet (80 mg total) by mouth daily. 05/22/22 05/17/23 Yes Mercy Riding, MD  CYANOCOBALAMIN IJ Inject 1 Dose as directed once a week.   Yes [provider]  glipiZIDE (GLUCOTROL XL) 10 MG 24 hr tablet Take 1 tablet (10 mg total) by mouth daily with breakfast. 04/25/22  Yes Henson, Vickie L, NP-C  insulin isophane & regular human KwikPen (NOVOLIN 70/30 KWIKPEN) (70-30) 100 UNIT/ML KwikPen Inject 25 Units into the skin 2 (two) times  daily after a meal. After breakfast and dinner 05/21/22  Yes Gonfa, Taye T, MD  metoprolol succinate (TOPROL-XL) 25 MG 24 hr tablet Take 1/2 tablet (12.5 mg total) by mouth daily. 05/22/22 11/18/22 Yes Mercy Riding, MD  omega-3 acid ethyl esters (LOVAZA) 1 g capsule Take 2 capsules (2 g total) by mouth 2 (two) times daily. 11/07/21  Yes [name], MD  ticagrelor (BRILINTA) 90 MG TABS tablet Take 1 tablet (90 mg total) by mouth 2 (two) times daily. 05/21/22 06/20/22 Yes Mercy Riding, MD  chlorhexidine (PERIDEX) 0.12 % solution Use as directed 15 mLs in the mouth or throat 2 (two) times daily. Patient not taking: Reported on 05/20/2022 03/29/22   Volney American, PA-C  Continuous Blood Gluc Receiver (DEXCOM G6 RECEIVER) DEVI Use to check blood sugars 3x daily. DX: E11.40 11/08/21   [name], MD  Continuous Blood Gluc Sensor (DEXCOM G6 SENSOR) MISC use as directed daily 11/07/21   [name], MD  Continuous Blood Gluc Transmit (DEXCOM G6 TRANSMITTER) MISC Use to check blood sugars 3 times a day as directed 11/07/21   [name], MD  Insulin Pen Needle (TRUEPLUS PEN NEEDLES) 31G X 5 MM MISC USE AS DIRECTED FOUR TIMES DAILY 11/22/21   [name], MD  Vitamin D, Ergocalciferol, (DRISDOL) 1.25 MG (50000 UNIT) CAPS capsule Take 1 capsule (50,000 Units total) by mouth every 7 (seven) days. Patient not taking: Reported on 05/27/2022 04/25/22   Girtha Rm, NP-C    Inpatient Medications: Scheduled Meds:  Continuous Infusions:  PRN Meds:   Allergies:   No Known Allergies  Social History:   Social History   Socioeconomic History   Marital status: Married    Spouse name: Not on file   Number of children: 4   Years of education: Not on file   Highest education level: Not on file  Occupational History   Occupation: Maintenance Tech  Tobacco Use   Smoking status: Never   Smokeless tobacco: Never  Vaping Use   Vaping Use: Never used  Substance and Sexual Activity    Alcohol use: No   Drug use: No   Sexual activity: Yes    Partners: Female  Other Topics Concern   Not on file  Social History Narrative   Not on file   Social Determinants of Health   Financial Resource Strain: Not on file  Food Insecurity: No Food Insecurity (05/22/2022)   Hunger Vital Sign    Worried About Running Out of Food in the Last Year: Never true    Ran Out of Food in the Last Year: Never true  Transportation Needs: No Transportation Needs (05/22/2022)   PRAPARE - Hydrologist (Medical): No    Lack of Transportation (Non-Medical): No  Physical Activity: Not on file  Stress: Not on file  Social Connections: Not on file  Intimate Partner Violence: Not At Risk (05/20/2022)  Humiliation, Afraid, Rape, and Kick questionnaire    Fear of Current or Ex-Partner: No    Emotionally Abused: No    Physically Abused: No    Sexually Abused: No    Family History:    Family History  Problem Relation Age of Onset   Heart disease Mother    Breast cancer Maternal Grandmother        diagnosed late 2's   Breast cancer Maternal Aunt      ROS:  Please see the history of present illness.   All other ROS reviewed and negative.     Physical Exam/Data:   Vitals:   05/27/22 0254 05/27/22 0543 05/27/22 0630 05/27/22 0730  BP: 135/78  (!) 136/94 102/71  Pulse: 60  66 70  Resp: '19  14 15  '$ Temp: 97.6 F (36.4 C) 97.6 F (36.4 C)  97.6 F (36.4 C)  TempSrc:  Oral    SpO2: 100%  98% 100%  Weight:      Height:       No intake or output data in the 24 hours ending 05/27/22 0939    05/27/2022    2:50 AM 05/19/2022    4:30 PM 04/25/2022    9:09 AM  Last 3 Weights  Weight (lbs) 272 lb 0.8 oz 272 lb 0.8 oz 272 lb  Weight (kg) 123.4 kg 123.4 kg 123.378 kg     Body mass index is 34.93 kg/m.  General:  Well nourished, well developed, in no acute distress HEENT: normal Neck: no JVD Vascular: No carotid bruits; Distal pulses 2+ bilaterally Cardiac:   normal S1, S2; RRR; no murmur  Lungs:  clear to auscultation bilaterally, no wheezing, rhonchi or rales  Abd: soft, nontender, no hepatomegaly  Ext: no edema Musculoskeletal:  No deformities, BUE and BLE strength normal and equal Skin: warm and dry  Neuro:  CNs 2-12 intact, no focal abnormalities noted Psych:  Normal affect   EKG:  The EKG was personally reviewed and demonstrates:  NSR, 63bpm, inferolateral TWI, new V6.  Telemetry:  Telemetry was personally reviewed and demonstrates:  Nsr HR 60s  Relevant CV Studies:  Echo 05/2022   1. Left ventricular ejection fraction, by estimation, is 60 to 65%. The  left ventricle has normal function. The left ventricle has no regional  wall motion abnormalities. There is mild asymmetric left ventricular  hypertrophy of the septal segment. Left  ventricular diastolic parameters are consistent with Grade I diastolic  dysfunction (impaired relaxation).   2. Right ventricular systolic function is normal. The right ventricular  size is normal.   3. The mitral valve was not well visualized. No evidence of mitral valve  regurgitation. No evidence of mitral stenosis.   4. The aortic valve is tricuspid. Aortic valve regurgitation is not  visualized. No aortic stenosis is present.   5. The inferior vena cava is normal in size with greater than 50%  respiratory variability, suggesting right atrial pressure of 3 mmHg.   Comparison(s): No significant change from prior study. Aortic dimensions  are within normal limits to age and BSA in this study.   Cardiac Cath 05/20/22   Prox LAD lesion is 60% stenosed.   1st Mrg lesion is 50% stenosed.   Prox Cx to Mid Cx lesion is 95% stenosed.   Mid Cx to Dist Cx lesion is 90% stenosed.   Prox RCA lesion is 50% stenosed.   RPAV lesion is 95% stenosed.   RPDA lesion is 70% stenosed.   A  drug-eluting stent was successfully placed using a SYNERGY XD [id number].   Post intervention, there is a 0% residual stenosis.    The left ventricular systolic function is normal.   LV end diastolic pressure is normal.   The left ventricular ejection fraction is 55-65% by visual estimate.   Micheal Neal is a 52 y.o. male      [id number] LOCATION:  FACILITY: [facility]  PHYSICIAN: [name], M.D. 1970/12/14     DATE OF PROCEDURE:  05/20/2022   DATE OF DISCHARGE:        CARDIAC CATHETERIZATION / PCI DES PLA       History obtained from chart review.52 y.o. male with history of uncontrolled DM, orthostatic hypotension and hyperlipidemia who presented after an episode of syncope.  His troponins rose to the 200 level.  His EKG did show subtle inferior ST segment elevation.Marland Kitchen  He presents now for coronary angiography.     PROCEDURE DESCRIPTION:    The patient was brought to the second floor [facility] cath lab in the postabsorptive state. He was premedicated with IV Versed and fentanyl. His right wrist was prepped and shaved in usual sterile fashion. Xylocaine 1% was used for local anesthesia. A 6 French sheath was inserted into the right radial artery using standard Seldinger technique. The patient received 6000 units  of heparin intravenously.  A 5 [place] TIG catheter and right Judkins catheters were used for selective coronary angiography and left ventriculography respectively.  Isovue dye is used for the entirety of the case (115 cc of contrast total to patient).  Retrograde aortic, left ventricular and pullback pressures were recorded.  LVEDP was measured at 5 mmHg.  Radial cocktail administered via the SideArm sheath.   Patient received a total of 15,000's of heparin with an ending ACT of 358.  Brilinta 180 mg p.o. loading dose was administered.  Using a 6 [place] RCA guiding catheter along with a 0.14 [place] guidewire and a 2 mm x 12 mm balloon the distal RCA was crossed with little difficulty and predilated with a 2 mm balloon.  Following this a 2.5 mm x 60 mm long Synergy drug-eluting stent was then  deployed at 14 atm (2.66 mm) resulting in reduction of a 95% ostial PLA Micheal Neal stenosis to 0% residual.  There was some plaque shift in the origin of the small PDA resulting in approximately 70% ostial stenosis however this is only a 1.5 mm vessel.     IMPRESSION: Successful PLA Jancarlo Biermann PCI drug-eluting stenting with excellent angiographic result.  The patient does have high-grade mid nondominant AV groove circumflex stenosis however this is a small vessel.  He has moderate disease in his proximal LAD that did not appear to be hemodynamically significant, and normal LV function.  I reviewed the angiographic results with Dr. Peter Martinique, his attending cardiologist, and we both feel that aggressive medical therapy is the best strategy moving forward to treat his residual CAD.  He will need to be on DAPT uninterrupted for least 12 months.  The sheath was removed and a TR band was placed on the right wrist to achieve patent hemostasis.  The patient left lab stable condition.   [name]. MD, [facility] 05/20/2022 12:36 PM    Echo 01/2022  1. Left ventricular ejection fraction, by estimation, is 60 to 65%. The  left ventricle has normal function. The left ventricle has no regional  wall motion abnormalities. There is mild left ventricular hypertrophy.  Left ventricular diastolic parameters  were  normal.   2. Right ventricular systolic function is normal. The right ventricular  size is normal. Tricuspid regurgitation signal is inadequate for assessing  PA pressure.   3. The mitral valve is normal in structure. No evidence of mitral valve  regurgitation. No evidence of mitral stenosis.   4. The aortic valve was not well visualized. Aortic valve regurgitation  is not visualized. No aortic stenosis is present.   5. Aortic dilatation noted. There is mild dilatation of the ascending  aorta, measuring 39 mm.      Laboratory Data:  High Sensitivity Troponin:   Recent Labs  Lab 05/19/22 1450  05/19/22 1622 05/27/22 0252 05/27/22 0600  TROPONINIHS 68* 274* 190* 186*     Chemistry Recent Labs  Lab 05/21/22 0236 05/27/22 0252  NA 137 138  K 3.7 3.8  CL 107 100  CO2 22 24  GLUCOSE 94 125*  BUN 10 23*  CREATININE 1.05 1.32*  CALCIUM 8.6* 9.2  MG 2.0  --   GFRNONAA >60 >60  ANIONGAP 8 14    Recent Labs  Lab 05/21/22 0236  ALBUMIN 3.1*   Lipids  Recent Labs  Lab 05/21/22 0236  CHOL 241*  TRIG 455*  HDL 31*  LDLCALC UNABLE TO CALCULATE IF TRIGLYCERIDE OVER 400 mg/dL  CHOLHDL 7.8    Hematology Recent Labs  Lab 05/21/22 0233 05/27/22 0252  WBC 6.3 7.4  RBC 4.52 4.79  HGB 12.7* 13.6  HCT 37.2* 40.8  MCV 82.3 85.2  MCH 28.1 28.4  MCHC 34.1 33.3  RDW 12.5 12.6  PLT 217 228   Thyroid No results for input(s): "TSH", "FREET4" in the last 168 hours.  BNPNo results for input(s): "BNP", "PROBNP" in the last 168 hours.  DDimer No results for input(s): "DDIMER" in the last 168 hours.   Radiology/Studies:  No results found.   Assessment and Plan:   Weakness/SOB CAD with recent NSTEMI s/p DES PLA Micheal Neal - recent admission for syncope found to have NSTEMI and treated with DES PA Micheal Neal. Started on DAPT with brillinta and ASA. echo with normal LVEF - presented with diaphoresis, SOB, and dizziness. BG was 70 (previously 300s) and he reported BP was high. - NO chest pain reported. EKG with inferolateral TWI - kidney function with dehydration, can give IVF bolus - H&H wnl - continue DAPT with ASA and brillinta - continue Lipitor and Toprol - orthostatic hypotension may be contributing, check orthostatic vitals - episode may be from low BG along with dehydration. BP may also be contributing. He is essentially feeling back to normal. Can check another troponin. Will discuss dispo with MD.  Orthostatic Hypotension - history of orthostatic hypotension. Started on Toprol during most recent hospitalization since Bps normalized - reported worsening dizziness at  home, may need to stop Toprol  HLD - continue Lipitor - LDL 121  Uncontrolled diabetes - A1c 12.5 - patient has been making lifestyle changes  For questions or updates, please contact Micheal Neal Please consult [email or web address] for contact info under    Signed, Cadence Arlyss Repress  05/27/2022 9:39 AM   Attending note  Patient seen and discussed with PA Further, I agree with her documentation.52 yo male history of DM2, HL, orthostatic hypotension, CAD with admission with NSTEMI earlier this month. Peak trop 274. Cath showed prox LAD 60%, prox to mid LCX 90-95%, OM1 50%, RPDA 70%, RPAV 95%. Had PCI too the PLA Micheal Neal with DES. Had high grade nondominant AV LCX disease but small vessel  and managed medically, moderate disease in his LAD did not appear significant.    K 3.8 BUN 23 Cr 1.32 WBC 7.4 Hgb 13.6 Plt 228  Trop 190-->186--> EKG SR, inferior infarct with inferior and lateral TWIs unchanged Echo: LVEF 60-65%, no WMAs, grade I dd, normal RV function    05/2022 cath Cath showed prox LAD 60%, prox to mid LCX 90-95%, OM1 50%, RPDA 70%, RPAV 95%. Had PCI too the PLA Micheal Neal with DES. Had high grade nondominant AV LCX disease but small vessel and managed medically, moderate disease in his LAD did not appear significant.   1.CAD - admit earlier this month with NSTEMI. did not have chest pain, symptoms were SOB and diaphoresis, syncope at that time - cath as reported above at that time, PCI to PLA Morgaine Kimball. Had high grade nondominant AV LCX disease but small vessel and managed medically, moderate disease in his LAD did not appear significant.   -presents again with SOB, diaphoresis. Again no chest pain. Very poorly controlled DM, certainly can have atypical features to his angina.  Trops 190 trending down, higher than I would think from his admit last week though unknown peak trop postcath.    -medical therapy with ASA 81, atorva 80, toprol 12.5, brillinta '90mg'$  bid.Start heparin  for now  - would admit 24 hr obs - I will discuss with interventional cards if residual LCX disease could be intervened on, also if worth FFR of 60% LAD disease  - discussed with Dr Burt Knack. LCX too small for intervention, LAD could be significant but does not look like an ACS level lesion, could consider FFR.  -  trial of ranexa, orthostatic hypotenstion wont tolerate other antianginas.      2.Orthostatic hypotension - likely from extended history of very poorly controlled DM2 -monitor symptoms    Carlyle Dolly MD

## 2022-05-27 NOTE — Progress Notes (Signed)
ANTICOAGULATION CONSULT NOTE - Initial Consult  Pharmacy Consult for Heparin Indication: chest pain/ACS  No Known Allergies  Patient Measurements: Height: '6\' 2"'$  (188 cm) Weight: 123.4 kg (272 lb 0.8 oz) IBW/kg (Calculated) : 82.2 HEPARIN DW (KG): 108.9   Vital Signs: Temp: 97.9 F (36.6 C) (02/26 2032) Temp Source: Oral (02/26 1757) BP: 107/73 (02/26 2032) Pulse Rate: 68 (02/26 2032)  Labs: Recent Labs    05/27/22 0252 05/27/22 0600 05/27/22 1046 05/27/22 2027  HGB 13.6  --   --   --   HCT 40.8  --   --   --   PLT 228  --   --   --   HEPARINUNFRC  --   --   --  0.42  CREATININE 1.32*  --   --   --   TROPONINIHS 190* 186* 146*  --      Estimated Creatinine Clearance: 94.5 mL/min (A) (by C-G formula based on SCr of 1.32 mg/dL (H)).   Medical History: Past Medical History:  Diagnosis Date   Abnormal EKG    Contusion of left foot    Diabetes mellitus without complication (HCC)    Dizziness    Near syncope    Palpitations    Vertigo     Medications:  See med rec  Assessment:  52 y.o. male with a hx of uncontrolled diabetes, hyperlipidemia, vertigo, orthostatic hypertension recent non-STEMI status post DES to PL branch who is being seen 05/27/2022 for the evaluation of weakness , diaphoresis, shortness of breath. Elevated troponin. Pharmacy asked to start heparin. Not on oral anticoagulation at home.  Goal of Therapy:  Heparin level 0.3-0.7 units/ml Monitor platelets by anticoagulation protocol: Yes  Time/Date: HL: Rate: 2/26'@2027'$  0.42 1300 units/hr(therapeutic x1)   Plan:  Therapeutic x 1 Continue heparin infusion at 1300 units/hr Will check confirmatory HL with AM labs Continue to monitor H&H and platelets  Pearla Dubonnet, PharmD Clinical Pharmacist 05/27/2022 9:04 PM

## 2022-05-27 NOTE — H&P (Signed)
History and Physical    Micheal Neal [id number] DOB: 10-24-1970 DOA: 05/27/2022  PCP: [name], MD   Patient coming from:   Chief Complaint  Patient presents with   Weakness      HPI:52 year old male who recently had NSTEMI in 2/20 with he had stent placed, diabetes mellitus on long-term insulin, CAD, orthostatic hypotension/dizziness/syncope, vertigo history presented with weakness to the ED.  He woke up with generalized weakness shortness of breath diaphoresis, and was covered in sweat, had no nausea or vomiting or chest pain.  He feels dizzy when he was having his heart attack few days ago The patient is admitted and discharged 2/24 syncopal episode, w. NSTEMI-his troponin had peaked to 274, had cardiac cath with stent placement and placed on Brilinta aspirin, EEG was normal echo showed EF 60 to 65%, and Lantus was increased due to uncontrolled hyperglycemia He currently no complaints, mild shortness of breath no weight gain of leg swelling or fever chills dysurea or focal weakness.  In the ED afebrile, heart rate respiration stable and normotensive not hypoxic, elevated Troponins 190>186>146, normal CBC, BMP with creatinine  1.3, baseline 1.05 05/21/22. No imaging.  EKG sinus rhythm inverted T waves in V6 which is new.  Cardiology was consulted-advised heparin drip and observation for 24 hours.    Assessment/Plan Principal Problem:   Diaphoresis  CAD Recent NSTEMI had PCI to PLA branch Elevated troponin Shortness of breath/diaphoresis/generalized weakness: Symptomatology concerning for cardiac etiology, troponins are downtrending likely from his recent non-ST elevation MI.  Cardiology input appreciated continue aspirin 81 Lipitor 80, Toprol 12.5, Brilinta 90, statin heparin drip for now and observe for 24 hours currently to discuss with intervention if residual LCx disease could be intervened and also if worth FFR of 60% LAD disease.  Trial of Ranexa. orthostatic hypotension  won't to tolerate antianginals. Discussed w/ cardio okay for diet- keep npo past MN  Orthostatic hypotension: In the setting of poorly controlled diabetes mellitus: Continue symptomatic regiment monitor  Type 2 diabetes mellitus with uncontrolled hyperglycemia on long-term insulin: Resume insulin regimen, add SSI and monitor closely. Recent Labs  Lab 05/20/22 2358 05/21/22 0458 05/21/22 0826 05/21/22 1159 05/27/22 0300  GLUCAP 140* 120* 262* 264* 96     Mild AKI creatinine at 1.3 baseline 1.0, encourage oral hydration.  Class I obesity Body mass index is 34.93 kg/m.  Will benefit w/ weight loss, osa eval  Severity of Illness: The appropriate patient status for this patient is OBSERVATION. Observation status is judged to be reasonable and necessary in order to provide the required intensity of service to ensure the patient's safety. The patient's presenting symptoms, physical exam findings, and initial radiographic and laboratory data in the context of their medical condition is felt to place them at decreased risk for further clinical deterioration. Furthermore, it is anticipated that the patient will be medically stable for discharge from the hospital within 2 midnights of admission.    DVT prophylaxis: heparin bolus via infusion 4,000 Units Start: 05/27/22 1315 Code Status:   Code Status: Full Code  Family Communication: Admission, patients condition and plan of care including tests being ordered have been discussed with the patient  who indicate understanding and agree with the plan and Code Status.  Consults called:  Dr Harl Bowie cardiology  Review of Systems: All systems were reviewed and were negative except as mentioned in HPI above. Negative for fever Negative for chest pain Negative for shortness of breath  Past Medical History:  Diagnosis Date  Abnormal EKG    Contusion of left foot    Diabetes mellitus without complication ([place])    Dizziness    Near syncope     Palpitations    Vertigo     Past Surgical History:  Procedure Laterality Date   CORONARY STENT INTERVENTION N/A 05/20/2022   Procedure: CORONARY STENT INTERVENTION;  Surgeon: Lorretta Harp, MD;  Location: [place] [facility];  Service: Cardiovascular;  Laterality: N/A;   LEFT HEART CATH AND CORONARY ANGIOGRAPHY N/A 05/20/2022   Procedure: LEFT HEART CATH AND CORONARY ANGIOGRAPHY;  Surgeon: Lorretta Harp, MD;  Location: [place] [facility];  Service: Cardiovascular;  Laterality: N/A;   NO PAST SURGERIES       reports that he has never smoked. He has never used smokeless tobacco. He reports that he does not drink alcohol and does not use drugs.  No Known Allergies  Family History  Problem Relation Age of Onset   Heart disease Mother    Breast cancer Maternal Grandmother        diagnosed late 93's   Breast cancer Maternal Aunt      Prior to Admission medications   Medication Sig Start Date End Date Taking? Authorizing Provider  aspirin EC 81 MG tablet Take 1 tablet (81 mg total) by mouth daily. Swallow whole. 02/05/22  Yes [name], MD  atorvastatin (LIPITOR) 80 MG tablet Take 1 tablet (80 mg total) by mouth daily. 05/22/22 05/17/23 Yes Mercy Riding, MD  CYANOCOBALAMIN IJ Inject 1 Dose as directed once a week.   Yes [provider]  glipiZIDE (GLUCOTROL XL) 10 MG 24 hr tablet Take 1 tablet (10 mg total) by mouth daily with breakfast. 04/25/22  Yes Henson, Vickie L, NP-C  insulin isophane & regular human KwikPen (NOVOLIN 70/30 KWIKPEN) (70-30) 100 UNIT/ML KwikPen Inject 25 Units into the skin 2 (two) times daily after a meal. After breakfast and dinner 05/21/22  Yes Gonfa, Taye T, MD  metoprolol succinate (TOPROL-XL) 25 MG 24 hr tablet Take 1/2 tablet (12.5 mg total) by mouth daily. 05/22/22 11/18/22 Yes Mercy Riding, MD  omega-3 acid ethyl esters (LOVAZA) 1 g capsule Take 2 capsules (2 g total) by mouth 2 (two) times daily. 11/07/21  Yes [name], MD  ticagrelor  (BRILINTA) 90 MG TABS tablet Take 1 tablet (90 mg total) by mouth 2 (two) times daily. 05/21/22 06/20/22 Yes Mercy Riding, MD  chlorhexidine (PERIDEX) 0.12 % solution Use as directed 15 mLs in the mouth or throat 2 (two) times daily. Patient not taking: Reported on 05/20/2022 03/29/22   Volney American, PA-C  Continuous Blood Gluc Receiver (DEXCOM G6 RECEIVER) DEVI Use to check blood sugars 3x daily. DX: E11.40 11/08/21   [name], MD  Continuous Blood Gluc Sensor (DEXCOM G6 SENSOR) MISC use as directed daily 11/07/21   [name], MD  Continuous Blood Gluc Transmit (DEXCOM G6 TRANSMITTER) MISC Use to check blood sugars 3 times a day as directed 11/07/21   [name], MD  Insulin Pen Needle (TRUEPLUS PEN NEEDLES) 31G X 5 MM MISC USE AS DIRECTED FOUR TIMES DAILY 11/22/21   [name], MD  Vitamin D, Ergocalciferol, (DRISDOL) 1.25 MG (50000 UNIT) CAPS capsule Take 1 capsule (50,000 Units total) by mouth every 7 (seven) days. Patient not taking: Reported on 05/27/2022 04/25/22   Girtha Rm, NP-C    Physical Exam: Vitals:   05/27/22 0800 05/27/22 0900 05/27/22 1030 05/27/22  1100  BP: 107/74 124/83 116/76 112/75  Pulse: 62 65 62 65  Resp: '13 12 17 13  '$ Temp:      TempSrc:      SpO2: 99% 100% 98% 94%  Weight:      Height:        General exam: AAOx3, pleasant , NAD, weak appearing. HEENT:Oral mucosa moist, Ear/Nose WNL grossly, dentition normal. Respiratory system: bilaterally clear,no wheezing or crackles,no use of accessory muscle Cardiovascular system: S1 & S2 +, No JVD,. Gastrointestinal system: Abdomen soft, NT,ND, BS+ Nervous System:Alert, awake, moving extremities and grossly nonfocal Extremities: No edema, distal peripheral pulses palpable.  Skin: No rashes,no icterus. MSK: Normal muscle bulk,tone, power   Labs on Admission: I have personally reviewed following labs and imaging studies  CBC: Recent Labs  Lab 05/21/22 0233 05/27/22 0252  WBC 6.3 7.4   HGB 12.7* 13.6  HCT 37.2* 40.8  MCV 82.3 85.2  PLT 217 [id number]   Basic Metabolic Panel: Recent Labs  Lab 05/21/22 0236 05/27/22 0252  NA 137 138  K 3.7 3.8  CL 107 100  CO2 22 24  GLUCOSE 94 125*  BUN 10 23*  CREATININE 1.05 1.32*  CALCIUM 8.6* 9.2  MG 2.0  --   PHOS 3.7  --    GFR: Estimated Creatinine Clearance: 94.5 mL/min (A) (by C-G formula based on SCr of 1.32 mg/dL (H)). Liver Function Tests: Recent Labs  Lab 05/21/22 0236  ALBUMIN 3.1*   CBG: Recent Labs  Lab 05/20/22 2358 05/21/22 0458 05/21/22 0826 05/21/22 1159 05/27/22 0300  GLUCAP 140* 120* 262* 264* 96  Urine analysis:    Component Value Date/Time   COLORURINE STRAW (A) 05/19/2022 1626   APPEARANCEUR CLEAR 05/19/2022 1626   LABSPEC 1.022 05/19/2022 1626   PHURINE 5.0 05/19/2022 1626   GLUCOSEU >=500 (A) 05/19/2022 1626   GLUCOSEU >=1000 (A) 11/06/2021 1627   HGBUR NEGATIVE 05/19/2022 1626   BILIRUBINUR NEGATIVE 05/19/2022 1626   BILIRUBINUR negative 04/25/2022 0947   KETONESUR NEGATIVE 05/19/2022 1626   PROTEINUR NEGATIVE 05/19/2022 1626   UROBILINOGEN 0.2 04/25/2022 0947   UROBILINOGEN 0.2 11/06/2021 1627   NITRITE NEGATIVE 05/19/2022 1626   LEUKOCYTESUR NEGATIVE 05/19/2022 1626    Radiological Exams on Admission: No results found.  Antonieta Pert MD [facility]  If 7PM-7AM, please contact night-coverage [email or web address]  05/27/2022, 1:55 PM

## 2022-05-27 NOTE — ED Provider Notes (Signed)
[facility] Emergency Department Provider Note MRN:  [id number]  Arrival date & time: 05/27/22     Chief Complaint   Weakness   History of Present Illness   Micheal Neal is a 52 y.o. year-old male with a history of CAD, diabetes presenting to the ED with chief complaint of weakness.  Patient woke up this evening with generalized weakness, shortness of breath, diaphoresis, explains he was covered in sweat.  No nausea or vomiting.  No chest pain.  Felt this way when he was having his heart attack a few days ago.  Recently had a stent in his heart.  Review of Systems  A thorough review of systems was obtained and all systems are negative except as noted in the HPI and PMH.   Patient's Health History    Past Medical History:  Diagnosis Date   Abnormal EKG    Contusion of left foot    Diabetes mellitus without complication ([place])    Dizziness    Near syncope    Palpitations    Vertigo     Past Surgical History:  Procedure Laterality Date   CORONARY STENT INTERVENTION N/A 05/20/2022   Procedure: CORONARY STENT INTERVENTION;  Surgeon: Lorretta Harp, MD;  Location: [place] [facility];  Service: Cardiovascular;  Laterality: N/A;   LEFT HEART CATH AND CORONARY ANGIOGRAPHY N/A 05/20/2022   Procedure: LEFT HEART CATH AND CORONARY ANGIOGRAPHY;  Surgeon: Lorretta Harp, MD;  Location: [place] [facility];  Service: Cardiovascular;  Laterality: N/A;   NO PAST SURGERIES      Family History  Problem Relation Age of Onset   Heart disease Mother    Breast cancer Maternal Grandmother        diagnosed late 13's   Breast cancer Maternal Aunt     Social History   Socioeconomic History   Marital status: Married    Spouse name: Not on file   Number of children: 4   Years of education: Not on file   Highest education level: Not on file  Occupational History   Occupation: Maintenance Tech  Tobacco Use   Smoking status: Never   Smokeless tobacco: Never   Vaping Use   Vaping Use: Never used  Substance and Sexual Activity   Alcohol use: No   Drug use: No   Sexual activity: Yes    Partners: Female  Other Topics Concern   Not on file  Social History Narrative   Not on file   Social Determinants of Health   Financial Resource Strain: Not on file  Food Insecurity: No Food Insecurity (05/22/2022)   Hunger Vital Sign    Worried About Running Out of Food in the Last Year: Never true    Ran Out of Food in the Last Year: Never true  Transportation Needs: No Transportation Needs (05/22/2022)   PRAPARE - Hydrologist (Medical): No    Lack of Transportation (Non-Medical): No  Physical Activity: Not on file  Stress: Not on file  Social Connections: Not on file  Intimate Partner Violence: Not At Risk (05/20/2022)   Humiliation, Afraid, Rape, and Kick questionnaire    Fear of Current or Ex-Partner: No    Emotionally Abused: No    Physically Abused: No    Sexually Abused: No     Physical Exam   Vitals:   05/27/22 0254 05/27/22 0543  BP: 135/78   Pulse: 60   Resp: 19   Temp: 97.6 F (  36.4 C) 97.6 F (36.4 C)  SpO2: 100%     CONSTITUTIONAL: Well-appearing, NAD NEURO/PSYCH:  Alert and oriented x 3, no focal deficits EYES:  eyes equal and reactive ENT/NECK:  no LAD, no JVD CARDIO: Regular rate, well-perfused, normal S1 and S2 PULM:  CTAB no wheezing or rhonchi GI/GU:  non-distended, non-tender MSK/SPINE:  No gross deformities, no edema SKIN:  no rash, atraumatic   *Additional and/or pertinent findings included in MDM below  Diagnostic and Interventional Summary    EKG Interpretation  Date/Time:  May 27, 2022 at 02:51:30 Ventricular Rate:   63 PR Interval:   172 QRS Duration:  86 QT Interval: 386   QTC Calculation:395   R Axis:     Text Interpretation: Sinus rhythm, inverted T waves new in V6 Confirmed by Dr. Gerlene Neal at 3:54 AM       Labs Reviewed  BASIC METABOLIC PANEL -  Abnormal; Notable for the following components:      Result Value   Glucose, Bld 125 (*)    BUN 23 (*)    Creatinine, Ser 1.32 (*)    All other components within normal limits  TROPONIN I (HIGH SENSITIVITY) - Abnormal; Notable for the following components:   Troponin I (High Sensitivity) 190 (*)    All other components within normal limits  TROPONIN I (HIGH SENSITIVITY) - Abnormal; Notable for the following components:   Troponin I (High Sensitivity) 186 (*)    All other components within normal limits  CBC  CBG MONITORING, ED    No orders to display    Medications - No data to display   Procedures  /  Critical Care Procedures  ED Course and Medical Decision Making  Initial Impression and Ddx Concern for possible ACS in this patient with known CAD, recent stent placement.  He endorses full compliance with his new medications, aspirin, Brilinta.  He had diffuse disease on the recent cath report.  In no acute distress at this time with minimal symptoms, normal vital signs, awaiting labs.  Past medical/surgical history that increases complexity of ED encounter: CAD, recent stent placement  Interpretation of Diagnostics I personally reviewed the EKG and my interpretation is as follows: Sinus rhythm, inverted T waves slightly more pronounced laterally compared to prior  Labs overall reveal no significant blood count or electrolyte disturbance.  Troponin is 190, was 274 during last admission.  Patient Reassessment and Ultimate Disposition/Management     Second troponin slightly downtrending.  Will discuss case with cardiology given recent cath.  Signed out to oncoming provider.  Patient management required discussion with the following services or consulting groups:  Cardiology  Complexity of Problems Addressed Acute illness or injury that poses threat of life of bodily function  Additional Data Reviewed and Analyzed Further history obtained from: Further history from  spouse/family member  Additional Factors Impacting ED Encounter Risk Consideration of hospitalization  Micheal Neal. Micheal Neal, [place] [email or web address]  Final Clinical Impressions(s) / ED Diagnoses     ICD-10-CM   1. Diaphoresis  R61     2. Weakness  R53.1     3. Shortness of breath  R06.02       ED Discharge Orders     None        Discharge Instructions Discussed with and Provided to Patient:   Discharge Instructions   None      Maudie Flakes, MD 05/27/22 [phone]

## 2022-05-27 NOTE — Hospital Course (Addendum)
52 year old male who recently had NSTEMI in 2/20 with he had stent placed, diabetes mellitus on long-term insulin, CAD, orthostatic hypotension/dizziness/syncope, vertigo history presented with weakness to the ED.  He woke up with generalized weakness shortness of breath diaphoresis, and was covered in sweat, had no nausea or vomiting or chest pain.  He feels dizzy when he was having his heart attack few days ago The patient is admitted and discharged 2/24 syncopal episode, w. NSTEMI-his troponin had peaked to 274, had cardiac cath with stent placement and placed on Brilinta aspirin, EEG was normal echo showed EF 60 to 65%, and Lantus was increased due to uncontrolled hyperglycemia He currently has  no complaints, mild shortness of breath no weight gain of leg swelling or fever chills dysurea or focal weakness. In the ED afebrile, heart rate respiration stable and normotensive not hypoxic, elevated Troponins 190>186>146, normal CBC, BMP with creatinine  1.3, baseline 1.05 05/21/22. No imaging.  EKG sinus rhythm inverted T waves in V6 which is new.  Cardiology was consulted-advised heparin drip and observation for 24 hours.  Overnight patient did well no chest pain diaphoresis appears asymptomatic back to baseline. Discussed with cardiology -his circ disease is too small to stent, may have had some recurrent ischemia from that lesion. He added ranexa as an antanginal. his LAD disease not significant to create ACS picture.  He is cleared for discharge home

## 2022-05-27 NOTE — Progress Notes (Signed)
ANTICOAGULATION CONSULT NOTE - Initial Consult  Pharmacy Consult for Heparin Indication: chest pain/ACS  No Known Allergies  Patient Measurements: Height: '6\' 2"'$  (188 cm) Weight: 123.4 kg (272 lb 0.8 oz) IBW/kg (Calculated) : 82.2 HEPARIN DW (KG): 108.9   Vital Signs: Temp: 97.6 F (36.4 C) (02/26 0730) Temp Source: Oral (02/26 0543) BP: 112/75 (02/26 1100) Pulse Rate: 65 (02/26 1100)  Labs: Recent Labs    05/27/22 0252 05/27/22 0600 05/27/22 1046  HGB 13.6  --   --   HCT 40.8  --   --   PLT 228  --   --   CREATININE 1.32*  --   --   TROPONINIHS 190* 186* 146*    Estimated Creatinine Clearance: 94.5 mL/min (A) (by C-G formula based on SCr of 1.32 mg/dL (H)).   Medical History: Past Medical History:  Diagnosis Date   Abnormal EKG    Contusion of left foot    Diabetes mellitus without complication (HCC)    Dizziness    Near syncope    Palpitations    Vertigo     Medications:  See med rec  Assessment:  52 y.o. male with a hx of uncontrolled diabetes, hyperlipidemia, vertigo, orthostatic hypertension recent non-STEMI status post DES to PL branch who is being seen 05/27/2022 for the evaluation of weakness , diaphoresis, shortness of breath. Elevated troponin. Pharmacy asked to start heparin. Not on oral anticoagulation at home.  Goal of Therapy:  Heparin level 0.3-0.7 units/ml Monitor platelets by anticoagulation protocol: Yes   Plan:  Give 4000 units bolus x 1 Start heparin infusion at 1300 units/hr Check anti-Xa level in ~6-8 hours and daily while on heparin Continue to monitor H&H and platelets  Isac Sarna, BS Pharm D, BCPS Clinical Pharmacist 05/27/2022,12:56 PM

## 2022-05-27 NOTE — Plan of Care (Signed)
  Problem: Education: Goal: Understanding of CV disease, CV risk reduction, and recovery process will improve Outcome: Progressing

## 2022-05-28 ENCOUNTER — Ambulatory Visit: Payer: 59 | Admitting: Internal Medicine

## 2022-05-28 ENCOUNTER — Other Ambulatory Visit (HOSPITAL_COMMUNITY): Payer: Self-pay

## 2022-05-28 DIAGNOSIS — I2510 Atherosclerotic heart disease of native coronary artery without angina pectoris: Secondary | ICD-10-CM | POA: Diagnosis not present

## 2022-05-28 DIAGNOSIS — R61 Generalized hyperhidrosis: Secondary | ICD-10-CM | POA: Diagnosis not present

## 2022-05-28 LAB — CBC
HCT: 39.9 % (ref 39.0–52.0)
Hemoglobin: 12.7 g/dL — ABNORMAL LOW (ref 13.0–17.0)
MCH: 27.5 pg (ref 26.0–34.0)
MCHC: 31.8 g/dL (ref 30.0–36.0)
MCV: 86.6 fL (ref 80.0–100.0)
Platelets: 207 10*3/uL (ref 150–400)
RBC: 4.61 MIL/uL (ref 4.22–5.81)
RDW: 12.9 % (ref 11.5–15.5)
WBC: 5.4 10*3/uL (ref 4.0–10.5)
nRBC: 0 % (ref 0.0–0.2)

## 2022-05-28 LAB — COMPREHENSIVE METABOLIC PANEL
ALT: 27 U/L (ref 0–44)
AST: 26 U/L (ref 15–41)
Albumin: 3.2 g/dL — ABNORMAL LOW (ref 3.5–5.0)
Alkaline Phosphatase: 64 U/L (ref 38–126)
Anion gap: 6 (ref 5–15)
BUN: 17 mg/dL (ref 6–20)
CO2: 25 mmol/L (ref 22–32)
Calcium: 8.3 mg/dL — ABNORMAL LOW (ref 8.9–10.3)
Chloride: 106 mmol/L (ref 98–111)
Creatinine, Ser: 1.18 mg/dL (ref 0.61–1.24)
GFR, Estimated: >60 mL/min (ref >60)
Glucose, Bld: 141 mg/dL — ABNORMAL HIGH (ref 70–99)
Potassium: 3.9 mmol/L (ref 3.5–5.1)
Sodium: 137 mmol/L (ref 135–145)
Total Bilirubin: 0.9 mg/dL (ref 0.3–1.2)
Total Protein: 6.9 g/dL (ref 6.5–8.1)

## 2022-05-28 LAB — HEPARIN LEVEL (UNFRACTIONATED): Heparin Unfractionated: 0.33 IU/mL (ref 0.30–0.70)

## 2022-05-28 LAB — D-DIMER, QUANTITATIVE: D-Dimer, Quant: 0.28 ug/mL-FEU (ref 0.00–0.50)

## 2022-05-28 LAB — GLUCOSE, CAPILLARY: Glucose-Capillary: 151 mg/dL — ABNORMAL HIGH (ref 70–99)

## 2022-05-28 MED ORDER — RANOLAZINE ER 500 MG PO TB12
500.0000 mg | ORAL_TABLET | Freq: Two times a day (BID) | ORAL | 0 refills | Status: DC
  Filled 2022-05-28 – 2022-06-03 (×2): qty 60, 30d supply, fill #0

## 2022-05-28 NOTE — Progress Notes (Signed)
Patient slept most of the night during this shift. Patient informed to be NPO after midnight for cardiology this am. Patient's wife would like to be called at [name]) if the patient is to have a procedure this am. No complaints of pain or discomfort. Plan of care ongoing.

## 2022-05-28 NOTE — Progress Notes (Addendum)
Rounding Note    Patient Name: Micheal Neal Date of Encounter: 05/28/2022  [place] Cardiologist: Lauree Chandler, MD   Subjective   No recurrent symptoms  Inpatient Medications    Scheduled Meds:  aspirin EC  81 mg Oral Daily   atorvastatin  80 mg Oral Daily   insulin aspart  0-5 Units Subcutaneous QHS   insulin aspart  0-9 Units Subcutaneous TID WC   insulin aspart protamine- aspart  17 Units Subcutaneous BID WC   metoprolol succinate  12.5 mg Oral Daily   omega-3 acid ethyl esters  2 g Oral BID   ranolazine  500 mg Oral BID   ticagrelor  90 mg Oral BID   Continuous Infusions:  heparin 1,300 Units/hr (05/28/22 0827)   PRN Meds: acetaminophen **OR** acetaminophen, senna-docusate   Vital Signs    Vitals:   05/27/22 1757 05/27/22 2032 05/28/22 0132 05/28/22 0547  BP: 107/63 107/73 109/66 102/75  Pulse: 72 68 72 65  Resp: '18 18 16 18  '$ Temp: 97.6 F (36.4 C) 97.9 F (36.6 C) 97.8 F (36.6 C) 97.7 F (36.5 C)  TempSrc: Oral     SpO2: 100% 99% 100% 99%  Weight:      Height:       No intake or output data in the 24 hours ending 05/28/22 1003    05/27/2022    2:50 AM 05/19/2022    4:30 PM 04/25/2022    9:09 AM  Last 3 Weights  Weight (lbs) 272 lb 0.8 oz 272 lb 0.8 oz 272 lb  Weight (kg) 123.4 kg 123.4 kg 123.378 kg      Telemetry    SR - Personally Reviewed  ECG    N/a - Personally Reviewed  Physical Exam   GEN: No acute distress.   Neck: No JVD Cardiac: RRR, no murmurs, rubs, or gallops.  Respiratory: Clear to auscultation bilaterally. GI: Soft, nontender, non-distended  MS: No edema; No deformity. Neuro:  Nonfocal  Psych: Normal affect   Labs    High Sensitivity Troponin:   Recent Labs  Lab 05/19/22 1450 05/19/22 1622 05/27/22 0252 05/27/22 0600 05/27/22 1046  TROPONINIHS 68* 274* 190* 186* 146*     Chemistry Recent Labs  Lab 05/27/22 0252 05/28/22 0400  NA 138 137  K 3.8 3.9  CL 100 106  CO2 24 25   GLUCOSE 125* 141*  BUN 23* 17  CREATININE 1.32* 1.18  CALCIUM 9.2 8.3*  PROT  --  6.9  ALBUMIN  --  3.2*  AST  --  26  ALT  --  27  ALKPHOS  --  64  BILITOT  --  0.9  GFRNONAA >60 >60  ANIONGAP 14 6    Lipids No results for input(s): "CHOL", "TRIG", "HDL", "LABVLDL", "LDLCALC", "CHOLHDL" in the last 168 hours.  Hematology Recent Labs  Lab 05/27/22 0252 05/28/22 0400  WBC 7.4 5.4  RBC 4.79 4.61  HGB 13.6 12.7*  HCT 40.8 39.9  MCV 85.2 86.6  MCH 28.4 27.5  MCHC 33.3 31.8  RDW 12.6 12.9  PLT 228 207   Thyroid No results for input(s): "TSH", "FREET4" in the last 168 hours.  BNPNo results for input(s): "BNP", "PROBNP" in the last 168 hours.  DDimer  Recent Labs  Lab 05/28/22 0400  DDIMER 0.28     Radiology    No results found.  Cardiac Studies   05/20/22 cath Prox LAD lesion is 60% stenosed.   1st Mrg lesion is 50%  stenosed.   Prox Cx to Mid Cx lesion is 95% stenosed.   Mid Cx to Dist Cx lesion is 90% stenosed.   Prox RCA lesion is 50% stenosed.   RPAV lesion is 95% stenosed.   RPDA lesion is 70% stenosed.   A drug-eluting stent was successfully placed using a SYNERGY XD 2.50X16.   Post intervention, there is a 0% residual stenosis.   The left ventricular systolic function is normal.   LV end diastolic pressure is normal.   The left ventricular ejection fraction is 55-65% by visual estimate.  Patient Profile     Micheal Neal is a 52 y.o. male with a hx of uncontrolled diabetes, hyperlipidemia, vertigo, orthostatic hypertension recent non-STEMI status post DES to PL Micheal Neal who is being seen 05/27/2022 for the evaluation of weakness at the request of Dr Roderic Palau.   Assessment & Plan    1.CAD - admit earlier this month with NSTEMI. did not have chest pain, symptoms were SOB and diaphoresis, syncope at that time - cath as reported above at that time, PCI to [facility] Micheal Neal. Had high grade nondominant AV LCX disease but small vessel and managed medically, moderate  disease in his LAD did not appear significant.    -presents again with SOB, diaphoresis. Again no chest pain. Very poorly controlled DM, certainly can have atypical features to his angina.  Trops 190 trending down, higher than I would think from his admit last week though unknown peak trop postcath.  - has been compliant with DAPT   - discussed with Dr Burt Knack. LCX too small for intervention, LAD could be significant but does not look like an ACS level lesion, could consider FFR.  -  trial of ranexa, orthostatic hypotenstion wont tolerate other antianginas.  - other medical therapy with ASA, brillinta, atorva, toprol 12.'5mg'$ . No ACE/ARB due to severe orthostatic hypotension  -trop has trended down, perhaps some residual ischemia from his LCX disease that is too small for intervention. LAD not significant enough for ACS per discussion with Dr Burt Knack, over time if recurrent angina could consider FFR of this lesion.  - no plans for repeat cath at this time, continue medical therapy.         2.Orthostatic hypotension - likely from extended history of very poorly controlled DM2 -monitor symptoms    Ok for discharge from cardiac standpoint, we will sign off inpatient care.   For questions or updates, please contact [place] Please consult [email or web address] for contact info under        Signed, Carlyle Dolly, MD  05/28/2022, 10:03 AM

## 2022-05-28 NOTE — Plan of Care (Signed)

## 2022-05-28 NOTE — Progress Notes (Signed)
Discharge instructions, RX's and follow up appts explained and provided to patient verbalized understanding. Patient left floor via wheelchair accompanied by staff. No c/o pain or shortness of breath at d/c.  Ellyn Rubiano, Tivis Ringer, RN

## 2022-05-28 NOTE — Progress Notes (Signed)
ANTICOAGULATION CONSULT NOTE -  Pharmacy Consult for Heparin Indication: chest pain/ACS  No Known Allergies  Patient Measurements: Height: '6\' 2"'$  (188 cm) Weight: 123.4 kg (272 lb 0.8 oz) IBW/kg (Calculated) : 82.2 HEPARIN DW (KG): 108.9   Vital Signs: Temp: 97.7 F (36.5 C) (02/27 0547) BP: 102/75 (02/27 0547) Pulse Rate: 65 (02/27 0547)  Labs: Recent Labs    05/27/22 0252 05/27/22 0600 05/27/22 1046 05/27/22 2027 05/28/22 0400  HGB 13.6  --   --   --  12.7*  HCT 40.8  --   --   --  39.9  PLT 228  --   --   --  207  HEPARINUNFRC  --   --   --  0.42 0.33  CREATININE 1.32*  --   --   --  1.18  TROPONINIHS 190* 186* 146*  --   --      Estimated Creatinine Clearance: 105.7 mL/min (by C-G formula based on SCr of 1.18 mg/dL).   Medical History: Past Medical History:  Diagnosis Date   Abnormal EKG    Contusion of left foot    Diabetes mellitus without complication (HCC)    Dizziness    Near syncope    Palpitations    Vertigo     Medications:  See med rec  Assessment:  52 y.o. male with a hx of uncontrolled diabetes, hyperlipidemia, vertigo, orthostatic hypertension recent non-STEMI status post DES to PL branch who is being seen 05/27/2022 for the evaluation of weakness , diaphoresis, shortness of breath. Elevated troponin. Pharmacy asked to start heparin. Not on oral anticoagulation at home.  HL 0.33- therapeutic  CBC WNL Trop 146  Goal of Therapy:  Heparin level 0.3-0.7 units/ml Monitor platelets by anticoagulation protocol: Yes    Plan:  Continue heparin infusion at 1300 units/hr HL with AM labs Continue to monitor H&H and platelets  Margot Ables, PharmD Clinical Pharmacist 05/28/2022 8:27 AM

## 2022-05-28 NOTE — Plan of Care (Signed)
  Problem: Education: Goal: Understanding of CV disease, CV risk reduction, and recovery process will improve Outcome: Progressing Goal: Individualized Educational Video(s) Outcome: Progressing   

## 2022-05-28 NOTE — Discharge Summary (Signed)
Physician Discharge Summary  Micheal Neal [id number] DOB: May 09, 1970 DOA: 05/27/2022  PCP: [name], MD  Admit date: 05/27/2022 Discharge date: 05/28/2022 Recommendations for Outpatient Follow-up:  Follow up with PCP in 1 weeks-call for appointment Follow-up with cardiology in 1 week Please obtain BMP/CBC in one week  Discharge Dispo: Home Discharge Condition: Stable Code Status:   Code Status: Full Code Diet recommendation:  Diet Order             Diet Heart Room service appropriate? Yes; Fluid consistency: Thin  Diet effective now                 Brief/Interim Summary:52 year old male who recently had NSTEMI in 2/20 with he had stent placed, diabetes mellitus on long-term insulin, CAD, orthostatic hypotension/dizziness/syncope, vertigo history presented with weakness to the ED.  He woke up with generalized weakness shortness of breath diaphoresis, and was covered in sweat, had no nausea or vomiting or chest pain.  He feels dizzy when he was having his heart attack few days ago The patient is admitted and discharged 2/24 syncopal episode, w. NSTEMI-his troponin had peaked to 274, had cardiac cath with stent placement and placed on Brilinta aspirin, EEG was normal echo showed EF 60 to 65%, and Lantus was increased due to uncontrolled hyperglycemia He currently has  no complaints, mild shortness of breath no weight gain of leg swelling or fever chills dysurea or focal weakness. In the ED afebrile, heart rate respiration stable and normotensive not hypoxic, elevated Troponins 190>186>146, normal CBC, BMP with creatinine  1.3, baseline 1.05 05/21/22. No imaging.  EKG sinus rhythm inverted T waves in V6 which is new.  Cardiology was consulted-advised heparin drip and observation for 24 hours.  Overnight patient did well no chest pain diaphoresis appears asymptomatic back to baseline. Discussed with cardiology -his circ disease is too small to stent, may have had some recurrent  ischemia from that lesion. He added ranexa as an antanginal. his LAD disease not significant to create ACS picture.  He is cleared for discharge home   Discharge Diagnoses:  Principal Problem:   Diaphoresis  CAD Recent NSTEMI had PCI to PLA branch Elevated troponin Shortness of breath/diaphoresis/generalized weakness: Symptomatology concerning for cardiac etiology, troponins are downtrending likely from his recent non-ST elevation MI.  Cardiology input appreciated-Discussed with cardiology -his circ disease is too small to stent, may have had some recurrent ischemia from that lesion. He added ranexa as an antanginal. his LAD disease not significant to create ACS picture.  He is cleared for discharge home continue aspirin 81 Lipitor 80, Toprol 12.5, Brilinta 90, statin. S/p  heparin drip overnight.   Orthostatic hypotension: In the setting of poorly controlled diabetes mellitus: Continue symptomatic regiment monitor   Type 2 diabetes mellitus with uncontrolled hyperglycemia on long-term insulin: Resume insulin regimen and po meds on dc.  Mild AKI creatinine at 1.3 baseline 1.0, resolved Class I obesity Body mass index is 34.93 kg/m.  Will benefit w/ weight loss, osa eval  Consults: Cardiology  Subjective: aaox3  Discharge Exam: Vitals:   05/28/22 0132 05/28/22 0547  BP: 109/66 102/75  Pulse: 72 65  Resp: 16 18  Temp: 97.8 F (36.6 C) 97.7 F (36.5 C)  SpO2: 100% 99%   General: Pt is alert, awake, not in acute distress Cardiovascular: RRR, S1/S2 +, no rubs, no gallops Respiratory: CTA bilaterally, no wheezing, no rhonchi Abdominal: Soft, NT, ND, bowel sounds + Extremities: no edema, no cyanosis  Discharge Instructions  Discharge  Instructions     Discharge instructions   Complete by: As directed    Please call call MD or return to ER for similar or worsening recurring problem that brought you to hospital or if any fever,nausea/vomiting,abdominal pain, uncontrolled pain,  chest pain,  shortness of breath or any other alarming symptoms.  Please follow-up your doctor as instructed in a week time and call the office for appointment.  Please avoid alcohol, smoking, or any other illicit substance and maintain healthy habits including taking your regular medications as prescribed.  You were cared for by a hospitalist during your hospital stay. If you have any questions about your discharge medications or the care you received while you were in the hospital after you are discharged, you can call the unit and ask to speak with the hospitalist on call if the hospitalist that took care of you is not available.  Once you are discharged, your primary care physician will handle any further medical issues. Please note that NO REFILLS for any discharge medications will be authorized once you are discharged, as it is imperative that you return to your primary care physician (or establish a relationship with a primary care physician if you do not have one) for your aftercare needs so that they can reassess your need for medications and monitor your lab values   Increase activity slowly   Complete by: As directed       Allergies as of 05/28/2022   No Known Allergies      Medication List     TAKE these medications    aspirin EC 81 MG tablet Take 1 tablet (81 mg total) by mouth daily. Swallow whole.   atorvastatin 80 MG tablet Commonly known as: LIPITOR Take 1 tablet (80 mg total) by mouth daily.   Brilinta 90 MG Tabs tablet Generic drug: ticagrelor Take 1 tablet (90 mg total) by mouth 2 (two) times daily.   CYANOCOBALAMIN IJ Inject 1 Dose as directed once a week.   Dexcom G6 Receiver Devi Use to check blood sugars 3x daily. DX: E11.40   Dexcom G6 Sensor Misc use as directed daily   Dexcom G6 Transmitter Misc Use to check blood sugars 3 times a day as directed   glipiZIDE 10 MG 24 hr tablet Commonly known as: Glucotrol XL Take 1 tablet (10 mg total) by  mouth daily with breakfast.   metoprolol succinate 25 MG 24 hr tablet Commonly known as: TOPROL-XL Take 1/2 tablet (12.5 mg total) by mouth daily.   NovoLIN 70/30 Kwikpen (70-30) 100 UNIT/ML KwikPen Generic drug: insulin isophane & regular human KwikPen Inject 25 Units into the skin 2 (two) times daily after a meal. After breakfast and dinner   omega-3 acid ethyl esters 1 g capsule Commonly known as: LOVAZA Take 2 capsules (2 g total) by mouth 2 (two) times daily.   ranolazine 500 MG 12 hr tablet Commonly known as: RANEXA Take 1 tablet (500 mg total) by mouth 2 (two) times daily.   TRUEplus 5-Bevel Pen Needles 31G X 5 MM Misc Generic drug: Insulin Pen Needle USE AS DIRECTED FOUR TIMES DAILY   Vitamin D (Ergocalciferol) 1.25 MG (50000 UNIT) Caps capsule Commonly known as: DRISDOL Take 1 capsule (50,000 Units total) by mouth every 7 (seven) days.        Follow-up Information     [name], MD Follow up in 1 week(s).   Specialty: Internal Medicine Contact information: [place] [address] [phone]  No Known Allergies  The results of significant diagnostics from this hospitalization (including imaging, microbiology, ancillary and laboratory) are listed below for reference.    Microbiology: No results found for this or any previous visit (from the past 240 hour(s)).  Procedures/Studies: ECHOCARDIOGRAM COMPLETE  Result Date: 05/21/2022    ECHOCARDIOGRAM REPORT   Patient Name:   Micheal Neal Date of Exam: 05/21/2022 Medical Rec #:  [id number]     Height:       74.0 in Accession #:    [id number]    Weight:       272.0 lb Date of Birth:  [birth year]      BSA:          2.477 m Patient Age:    17 years      BP:           102/73 mmHg Patient Gender: M             HR:           75 bpm. Exam Location:  Inpatient Procedure: 2D Echo, Cardiac Doppler, Color Doppler and Intracardiac            Opacification Agent Indications:    CAD Native  Vessel I25.10  History:        Patient has prior history of Echocardiogram examinations, most                 recent 02/15/2022. Signs/Symptoms:Hypotension and Syncope; Risk                 Factors:Diabetes and Dyslipidemia.  Sonographer:    Ronny Flurry Referring Phys: [place]  1. Left ventricular ejection fraction, by estimation, is 60 to 65%. The left ventricle has normal function. The left ventricle has no regional wall motion abnormalities. There is mild asymmetric left ventricular hypertrophy of the septal segment. Left ventricular diastolic parameters are consistent with Grade I diastolic dysfunction (impaired relaxation).  2. Right ventricular systolic function is normal. The right ventricular size is normal.  3. The mitral valve was not well visualized. No evidence of mitral valve regurgitation. No evidence of mitral stenosis.  4. The aortic valve is tricuspid. Aortic valve regurgitation is not visualized. No aortic stenosis is present.  5. The inferior vena cava is normal in size with greater than 50% respiratory variability, suggesting right atrial pressure of 3 mmHg. Comparison(s): No significant change from prior study. Aortic dimensions are within normal limits to age and BSA in this study. FINDINGS  Left Ventricle: Left ventricular ejection fraction, by estimation, is 60 to 65%. The left ventricle has normal function. The left ventricle has no regional wall motion abnormalities. Definity contrast agent was given IV to delineate the left ventricular  endocardial borders. The left ventricular internal cavity size was normal in size. There is mild asymmetric left ventricular hypertrophy of the septal segment. Left ventricular diastolic parameters are consistent with Grade I diastolic dysfunction (impaired relaxation). Right Ventricle: The right ventricular size is normal. No increase in right ventricular wall thickness. Right ventricular systolic function is normal. Left  Atrium: Left atrial size was normal in size. Right Atrium: Right atrial size was normal in size. Pericardium: There is no evidence of pericardial effusion. Mitral Valve: The mitral valve was not well visualized. No evidence of mitral valve regurgitation. No evidence of mitral valve stenosis. Tricuspid Valve: The tricuspid valve is normal in structure. Tricuspid valve regurgitation is not demonstrated. No evidence of tricuspid stenosis. Aortic Valve: The aortic  valve is tricuspid. Aortic valve regurgitation is not visualized. No aortic stenosis is present. Aortic valve mean gradient measures 4.0 mmHg. Aortic valve peak gradient measures 7.4 mmHg. Aortic valve area, by VTI measures 3.64 cm. Pulmonic Valve: The pulmonic valve was not well visualized. Pulmonic valve regurgitation is not visualized. No evidence of pulmonic stenosis. Aorta: The aortic root and ascending aorta are structurally normal, with no evidence of dilitation. Venous: The inferior vena cava is normal in size with greater than 50% respiratory variability, suggesting right atrial pressure of 3 mmHg. IAS/Shunts: No atrial level shunt detected by color flow Doppler.  LEFT VENTRICLE PLAX 2D LVIDd:         4.40 cm   Diastology LVIDs:         2.90 cm   LV e' medial:    6.96 cm/s LV PW:         0.80 cm   LV E/e' medial:  12.1 LV IVS:        1.20 cm   LV e' lateral:   10.90 cm/s LVOT diam:     2.20 cm   LV E/e' lateral: 7.7 LV SV:         95 LV SV Index:   38 LVOT Area:     3.80 cm  RIGHT VENTRICLE             IVC RV S prime:     11.70 cm/s  IVC diam: 1.30 cm TAPSE (M-mode): 1.8 cm LEFT ATRIUM             Index        RIGHT ATRIUM           Index LA diam:        3.60 cm 1.45 cm/m   RA Area:     11.60 cm LA Vol (A2C):   49.1 ml 19.82 ml/m  RA Volume:   25.20 ml  10.17 ml/m LA Vol (A4C):   41.6 ml 16.79 ml/m LA Biplane Vol: 46.6 ml 18.81 ml/m  AORTIC VALVE AV Area (Vmax):    3.66 cm AV Area (Vmean):   3.40 cm AV Area (VTI):     3.64 cm AV Vmax:            136.00 cm/s AV Vmean:          92.300 cm/s AV VTI:            0.262 m AV Peak Grad:      7.4 mmHg AV Mean Grad:      4.0 mmHg LVOT Vmax:         131.00 cm/s LVOT Vmean:        82.600 cm/s LVOT VTI:          0.251 m LVOT/AV VTI ratio: 0.96  AORTA Ao Root diam: 3.20 cm Ao Asc diam:  3.20 cm MITRAL VALVE MV Area (PHT): 3.65 cm    SHUNTS MV Decel Time: 208 msec    Systemic VTI:  0.25 m MV E velocity: 84.40 cm/s  Systemic Diam: 2.20 cm MV A velocity: 80.50 cm/s MV E/A ratio:  1.05 Rudean Haskell MD Electronically signed by Rudean Haskell MD Signature Date/Time: 05/21/2022/2:05:15 PM    Final    CARDIAC CATHETERIZATION  Result Date: 05/20/2022   Prox LAD lesion is 60% stenosed.   1st Mrg lesion is 50% stenosed.   Prox Cx to Mid Cx lesion is 95% stenosed.   Mid Cx to Dist Cx lesion is 90% stenosed.  Prox RCA lesion is 50% stenosed.   RPAV lesion is 95% stenosed.   RPDA lesion is 70% stenosed.   A drug-eluting stent was successfully placed using a SYNERGY XD 2.50X16.   Post intervention, there is a 0% residual stenosis.   The left ventricular systolic function is normal.   LV end diastolic pressure is normal.   The left ventricular ejection fraction is 55-65% by visual estimate. Micheal Neal is a 52 y.o. male  [id number] LOCATION:  FACILITY: [facility] PHYSICIAN: [name], M.D. [birth year] DATE OF PROCEDURE:  05/20/2022 DATE OF DISCHARGE: CARDIAC CATHETERIZATION / PCI DES PLA History obtained from chart review.52 y.o. male with history of uncontrolled DM, orthostatic hypotension and hyperlipidemia who presented after an episode of syncope.  His troponins rose to the 200 level.  His EKG did show subtle inferior ST segment elevation.Marland Kitchen  He presents now for coronary angiography. PROCEDURE DESCRIPTION: The patient was brought to the second floor [facility] cath lab in the postabsorptive state. He was premedicated with IV Versed and fentanyl. His right wrist was prepped and shaved in usual sterile  fashion. Xylocaine 1% was used for local anesthesia. A 6 French sheath was inserted into the right radial artery using standard Seldinger technique. The patient received 6000 units  of heparin intravenously.  A 5 [place] TIG catheter and right Judkins catheters were used for selective coronary angiography and left ventriculography respectively.  Isovue dye is used for the entirety of the case (115 cc of contrast total to patient).  Retrograde aortic, left ventricular and pullback pressures were recorded.  LVEDP was measured at 5 mmHg.  Radial cocktail administered via the SideArm sheath. Patient received a total of 15,000's of heparin with an ending ACT of 358.  Brilinta 180 mg p.o. loading dose was administered.  Using a 6 [place] RCA guiding catheter along with a 0.14 [place] guidewire and a 2 mm x 12 mm balloon the distal RCA was crossed with little difficulty and predilated with a 2 mm balloon.  Following this a 2.5 mm x 60 mm long Synergy drug-eluting stent was then deployed at 14 atm (2.66 mm) resulting in reduction of a 95% ostial PLA branch stenosis to 0% residual.  There was some plaque shift in the origin of the small PDA resulting in approximately 70% ostial stenosis however this is only a 1.5 mm vessel.   Successful PLA branch PCI drug-eluting stenting with excellent angiographic result.  The patient does have high-grade mid nondominant AV groove circumflex stenosis however this is a small vessel.  He has moderate disease in his proximal LAD that did not appear to be hemodynamically significant, and normal LV function.  I reviewed the angiographic results with Dr. Peter Martinique, his attending cardiologist, and we both feel that aggressive medical therapy is the best strategy moving forward to treat his residual CAD.  He will need to be on DAPT uninterrupted for least 12 months.  The sheath was removed and a TR band was placed on the right wrist to achieve patent hemostasis.  The patient left lab stable  condition. [name]. MD, [facility] 05/20/2022 12:36 PM    EEG adult  Result Date: 05/20/2022 Lora Havens, MD     05/20/2022 10:23 AM Patient Name: Micheal Neal MRN: [id number] Epilepsy Attending: Lora Havens Referring Physician/Provider: Mercy Riding, MD Date: 05/20/2022 Duration: 21.18 mins Patient history: 52yo M with syncope. EEG to evaluate for seizure. Level of alertness: Awake AEDs during EEG study: None Technical aspects:  This EEG study was done with scalp electrodes positioned according to the 10-20 International system of electrode placement. Electrical activity was reviewed with band pass filter of 1-'70Hz'$ , sensitivity of 7 uV/mm, display speed of 97m/sec with a '60Hz'$  notched filter applied as appropriate. EEG data were recorded continuously and digitally stored.  Video monitoring was available and reviewed as appropriate. Description: The posterior dominant rhythm consists of 9 Hz activity of moderate voltage (25-35 uV) seen predominantly in posterior head regions, symmetric and reactive to eye opening and eye closing. Hyperventilation and photic stimulation were not performed.   IMPRESSION: This study is within normal limits. No seizures or epileptiform discharges were seen throughout the recording. A normal interictal EEG does not exclude the diagnosis of epilepsy. PLora Havens  CT Head Wo Contrast  Result Date: 05/19/2022 CLINICAL DATA:  Altered level of consciousness, syncope, dizziness, hyperglycemia EXAM: CT HEAD WITHOUT CONTRAST TECHNIQUE: Contiguous axial images were obtained from the base of the skull through the vertex without intravenous contrast. RADIATION DOSE REDUCTION: This exam was performed according to the departmental dose-optimization program which includes automated exposure control, adjustment of the mA and/or kV according to patient size and/or use of iterative reconstruction technique. COMPARISON:  None Available. FINDINGS: Brain: No acute infarct or  hemorrhage. Lateral ventricles and midline structures are unremarkable. No acute extra-axial fluid collections. No mass effect. Vascular: No hyperdense vessel or unexpected calcification. Skull: Normal. Negative for fracture or focal lesion. Sinuses/Orbits: Minimal polypoid mucosal thickening right maxillary sinus. Remaining paranasal sinuses are clear. Other: None. IMPRESSION: 1. No acute intracranial process. Electronically Signed   By: MRanda NgoM.D.   On: 05/19/2022 15:02   DG Chest Port 1 View  Result Date: 05/19/2022 CLINICAL DATA:  Syncope.  Dizziness. EXAM: PORTABLE CHEST 1 VIEW COMPARISON:  11/18/2021. FINDINGS: Normal heart, mediastinum and hila. Clear lungs.  No pleural effusion or pneumothorax. Skeletal structures are grossly intact. IMPRESSION: No active disease. Electronically Signed   By: DLajean ManesM.D.   On: 05/19/2022 14:39    Labs: BNP (last 3 results) No results for input(s): "BNP" in the last 8760 hours. Basic Metabolic Panel: Recent Labs  Lab 05/27/22 0252 05/28/22 0400  NA 138 137  K 3.8 3.9  CL 100 106  CO2 24 25  GLUCOSE 125* 141*  BUN 23* 17  CREATININE 1.32* 1.18  CALCIUM 9.2 8.3*   Liver Function Tests: Recent Labs  Lab 05/28/22 0400  AST 26  ALT 27  ALKPHOS 64  BILITOT 0.9  PROT 6.9  ALBUMIN 3.2*   No results for input(s): "LIPASE", "AMYLASE" in the last 168 hours. No results for input(s): "AMMONIA" in the last 168 hours. CBC: Recent Labs  Lab 05/27/22 0252 05/28/22 0400  WBC 7.4 5.4  HGB 13.6 12.7*  HCT 40.8 39.9  MCV 85.2 86.6  PLT 228 207   Cardiac Enzymes: No results for input(s): "CKTOTAL", "CKMB", "CKMBINDEX", "TROPONINI" in the last 168 hours. BNP: Invalid input(s): "POCBNP" CBG: Recent Labs  Lab 05/21/22 1159 05/27/22 0300 05/27/22 1747 05/27/22 2033  GLUCAP 264* 96 246* 219*   D-Dimer Recent Labs    05/28/22 0400  DDIMER 0.28   Hgb A1c No results for input(s): "HGBA1C" in the last 72 hours. Lipid  Profile No results for input(s): "CHOL", "HDL", "LDLCALC", "TRIG", "CHOLHDL", "LDLDIRECT" in the last 72 hours. Thyroid function studies No results for input(s): "TSH", "T4TOTAL", "T3FREE", "THYROIDAB" in the last 72 hours.  Invalid input(s): "FREET3" Anemia work up No results for  input(s): "VITAMINB12", "FOLATE", "FERRITIN", "TIBC", "IRON", "RETICCTPCT" in the last 72 hours. Urinalysis    Component Value Date/Time   COLORURINE STRAW (A) 05/19/2022 1626   APPEARANCEUR CLEAR 05/19/2022 1626   LABSPEC 1.022 05/19/2022 1626   PHURINE 5.0 05/19/2022 1626   GLUCOSEU >=500 (A) 05/19/2022 1626   GLUCOSEU >=1000 (A) 11/06/2021 1627   HGBUR NEGATIVE 05/19/2022 1626   BILIRUBINUR NEGATIVE 05/19/2022 1626   BILIRUBINUR negative 04/25/2022 0947   KETONESUR NEGATIVE 05/19/2022 1626   PROTEINUR NEGATIVE 05/19/2022 1626   UROBILINOGEN 0.2 04/25/2022 0947   UROBILINOGEN 0.2 11/06/2021 1627   NITRITE NEGATIVE 05/19/2022 1626   LEUKOCYTESUR NEGATIVE 05/19/2022 1626   Sepsis Labs Recent Labs  Lab 05/27/22 0252 05/28/22 0400  WBC 7.4 5.4   Microbiology No results found for this or any previous visit (from the past 240 hour(s)).   Time coordinating discharge: 25 minutes  SIGNED: Antonieta Pert, MD  [facility] 05/28/2022, 10:32 AM  If 7PM-7AM, please contact night-coverage [email or web address]

## 2022-05-29 ENCOUNTER — Telehealth: Payer: Self-pay

## 2022-05-29 ENCOUNTER — Other Ambulatory Visit (HOSPITAL_COMMUNITY): Payer: Self-pay

## 2022-05-29 NOTE — Transitions of Care (Post Inpatient/ED Visit) (Signed)
   05/29/2022  Name: Micheal Neal MRN: [id number] DOB: 06-07-1970  Today's TOC FU Call Status: TOC FU Call Complete Date: 05/29/22  Transition Care Management Follow-up Telephone Call Date of Discharge: 05/28/22 Discharge Facility[facility] Type of Discharge: Inpatient Admission Primary Inpatient Discharge Diagnosis:: Diaphoresis How have you been since you were released from the hospital?: Better Any questions or concerns?: No  Items Reviewed: Did you receive and understand the discharge instructions provided?: Yes Medications obtained and verified?: Yes (Medications Reviewed) Any new allergies since your discharge?: No Dietary orders reviewed?: Yes Do you have support at home?: Yes  Home Care and Equipment/Supplies: [place] Ordered?: No Any new equipment or medical supplies ordered?: No  Functional Questionnaire: Do you need assistance with bathing/showering or dressing?: No Do you need assistance with meal preparation?: No Do you need assistance with eating?: No Do you have difficulty maintaining continence: No Do you need assistance with getting out of bed/getting out of a chair/moving?: No Do you have difficulty managing or taking your medications?: No  Folllow up appointments reviewed: PCP Follow-up appointment confirmed?: Yes Date of PCP follow-up appointment?: 05/30/22 Follow-up Provider: Dr Ronnald Ramp [facility] Follow-up appointment confirmed?: Yes Date of Specialist follow-up appointment?: 05/31/22 Follow-Up Specialty Provider:: Dr Kelton Pillar Do you need transportation to your follow-up appointment?: No Do you understand care options if your condition(s) worsen?: Yes-patient verbalized understanding    [place] [phone]

## 2022-05-30 ENCOUNTER — Ambulatory Visit (INDEPENDENT_AMBULATORY_CARE_PROVIDER_SITE_OTHER): Payer: 59 | Admitting: Internal Medicine

## 2022-05-30 ENCOUNTER — Encounter: Payer: Self-pay | Admitting: Internal Medicine

## 2022-05-30 ENCOUNTER — Other Ambulatory Visit (HOSPITAL_COMMUNITY): Payer: Self-pay

## 2022-05-30 VITALS — BP 110/68 | HR 77 | Temp 97.8°F | Ht 74.0 in | Wt 266.0 lb

## 2022-05-30 DIAGNOSIS — H5789 Other specified disorders of eye and adnexa: Secondary | ICD-10-CM | POA: Insufficient documentation

## 2022-05-30 DIAGNOSIS — H1031 Unspecified acute conjunctivitis, right eye: Secondary | ICD-10-CM | POA: Insufficient documentation

## 2022-05-30 DIAGNOSIS — M14672 Charcot's joint, left ankle and foot: Secondary | ICD-10-CM

## 2022-05-30 DIAGNOSIS — R0681 Apnea, not elsewhere classified: Secondary | ICD-10-CM | POA: Insufficient documentation

## 2022-05-30 DIAGNOSIS — H5711 Ocular pain, right eye: Secondary | ICD-10-CM | POA: Insufficient documentation

## 2022-05-30 MED ORDER — NEOMYCIN-POLYMYXIN-DEXAMETH 3.5-10000-0.1 OP SUSP
2.0000 [drp] | Freq: Four times a day (QID) | OPHTHALMIC | 0 refills | Status: DC
  Filled 2022-05-30: qty 5, 13d supply, fill #0

## 2022-05-30 NOTE — Progress Notes (Addendum)
Subjective:  Patient ID: Micheal Neal, male    DOB: 1970/11/02  Age: 52 y.o. MRN: [id number]  CC: Hypertension and Diabetes   HPI [name] presents for f/up ----  He complains of a 2-day history of red, itchy, painful right eye.  He does not wear contacts and denies injury.  He has had this happen before.  He denies photophobia or visual disturbance.  PCP: [name], MD   Admit date: 05/27/2022 Discharge date: 05/28/2022 Recommendations for Outpatient Follow-up:  Follow up with PCP in 1 weeks-call for appointment Follow-up with cardiology in 1 week Please obtain BMP/CBC in one week   Discharge Dispo: Home Discharge Condition: Stable Code Status:   Code Status: Full Code Diet recommendation:  Diet Order      Outpatient Medications Prior to Visit  Medication Sig Dispense Refill   aspirin EC 81 MG tablet Take 1 tablet (81 mg total) by mouth daily. Swallow whole. 90 tablet 1   atorvastatin (LIPITOR) 80 MG tablet Take 1 tablet (80 mg total) by mouth daily. 90 tablet 3   Continuous Blood Gluc Receiver (DEXCOM G6 RECEIVER) DEVI Use to check blood sugars 3x daily. DX: E11.40 1 each 5   Continuous Blood Gluc Sensor (DEXCOM G6 SENSOR) MISC use as directed daily 3 each 4   Continuous Blood Gluc Transmit (DEXCOM G6 TRANSMITTER) MISC Use to check blood sugars 3 times a day as directed 1 each 3   CYANOCOBALAMIN IJ Inject 1 Dose as directed once a week.     glipiZIDE (GLUCOTROL XL) 10 MG 24 hr tablet Take 1 tablet (10 mg total) by mouth daily with breakfast. 90 tablet 0   insulin isophane & regular human KwikPen (NOVOLIN 70/30 KWIKPEN) (70-30) 100 UNIT/ML KwikPen Inject 25 Units into the skin 2 (two) times daily after a meal. After breakfast and dinner 15 mL 4   Insulin Pen Needle (TRUEPLUS PEN NEEDLES) 31G X 5 MM MISC USE AS DIRECTED FOUR TIMES DAILY 100 each 3   metoprolol succinate (TOPROL-XL) 25 MG 24 hr tablet Take 1/2 tablet (12.5 mg total) by mouth daily. 45 tablet 1    omega-3 acid ethyl esters (LOVAZA) 1 g capsule Take 2 capsules (2 g total) by mouth 2 (two) times daily. 360 capsule 1   ranolazine (RANEXA) 500 MG 12 hr tablet Take 1 tablet (500 mg total) by mouth 2 (two) times daily. 60 tablet 0   ticagrelor (BRILINTA) 90 MG TABS tablet Take 1 tablet (90 mg total) by mouth 2 (two) times daily. 60 tablet 0   Vitamin D, Ergocalciferol, (DRISDOL) 1.25 MG (50000 UNIT) CAPS capsule Take 1 capsule (50,000 Units total) by mouth every 7 (seven) days. 12 capsule 0   No facility-administered medications prior to visit.    ROS Review of Systems  Constitutional:  Negative for chills, fatigue and fever.  HENT: Negative.  Negative for sinus pressure.   Eyes:  Positive for redness and itching. Negative for photophobia, pain, discharge and visual disturbance.  Respiratory:  Positive for apnea. Negative for cough and shortness of breath.   Cardiovascular:  Negative for chest pain, palpitations and leg swelling.  Gastrointestinal:  Negative for abdominal pain, diarrhea, nausea and vomiting.  Endocrine: Negative.   Genitourinary: Negative.  Negative for difficulty urinating.  Musculoskeletal:  Positive for arthralgias. Negative for myalgias.  Skin: Negative.  Negative for color change.  Neurological: Negative.   Hematological:  Negative for adenopathy. Does not bruise/bleed easily.  Psychiatric/Behavioral: Negative.  Objective:  BP 110/68 (BP Location: Right Arm, Patient Position: Sitting, Cuff Size: Normal)   Pulse 77   Temp 97.8 F (36.6 C) (Oral)   Ht '6\' 2"'$  (1.88 m)   Wt 266 lb (120.7 kg)   SpO2 99%   BMI 34.15 kg/m   BP Readings from Last 3 Encounters:  05/30/22 110/68  05/28/22 102/75  05/21/22 102/73    Wt Readings from Last 3 Encounters:  05/30/22 266 lb (120.7 kg)  05/27/22 272 lb 0.8 oz (123.4 kg)  05/19/22 272 lb 0.8 oz (123.4 kg)    Physical Exam Vitals reviewed.  Eyes:     General: Lids are normal.        Right eye: No foreign  body, discharge or hordeolum.        Left eye: No foreign body, discharge or hordeolum.     Conjunctiva/sclera:     Right eye: Right conjunctiva is injected. No chemosis, exudate or hemorrhage.    Left eye: Left conjunctiva is not injected. No chemosis, exudate or hemorrhage.    Comments: I was not able to screen for corneal abrasion since we did not have any fluorescein stain in the office.  Cardiovascular:     Rate and Rhythm: Normal rate and regular rhythm.     Heart sounds: No murmur heard. Pulmonary:     Effort: Pulmonary effort is normal.     Breath sounds: No stridor. No wheezing, rhonchi or rales.  Abdominal:     General: Abdomen is flat.     Palpations: There is no mass.     Tenderness: There is no abdominal tenderness. There is no guarding.     Hernia: No hernia is present.  Musculoskeletal:        General: Normal range of motion.     Cervical back: Neck supple.     Right lower leg: No edema.     Left lower leg: No edema.  Lymphadenopathy:     Head:     Right side of head: No preauricular adenopathy.     Left side of head: No preauricular adenopathy.     Cervical: No cervical adenopathy.  Skin:    General: Skin is warm and dry.  Neurological:     General: No focal deficit present.     Mental Status: He is alert. Mental status is at baseline.  Psychiatric:        Mood and Affect: Mood normal.        Behavior: Behavior normal.     Lab Results  Component Value Date   WBC 5.4 05/28/2022   HGB 12.7 (L) 05/28/2022   HCT 39.9 05/28/2022   PLT 207 05/28/2022   GLUCOSE 141 (H) 05/28/2022   CHOL 241 (H) 05/21/2022   TRIG 455 (H) 05/21/2022   HDL 31 (L) 05/21/2022   LDLDIRECT 121 (H) 05/21/2022   LDLCALC UNABLE TO CALCULATE IF TRIGLYCERIDE OVER 400 mg/dL 05/21/2022   ALT 27 05/28/2022   AST 26 05/28/2022   NA 137 05/28/2022   K 3.9 05/28/2022   CL 106 05/28/2022   CREATININE 1.18 05/28/2022   BUN 17 05/28/2022   CO2 25 05/28/2022   TSH 1.86 04/25/2022    HGBA1C 12.5 (H) 05/19/2022   MICROALBUR 3.0 (H) 11/06/2021    No results found.  Assessment & Plan:   Jerold was seen today for hypertension and diabetes.  Diagnoses and all orders for this visit:  Redness of right eye -     Ambulatory referral  to Ophthalmology  Acute pain in right eye -     Ambulatory referral to Ophthalmology  Witnessed episode of apnea -     Ambulatory referral to Sleep Studies  Charcot's joint of foot, left -     Ambulatory referral to Podiatry  Acute conjunctivitis of right eye, unspecified acute conjunctivitis type -     neomycin-polymyxin b-dexamethasone (MAXITROL) 3.5-10000-0.1 SUSP; Place 2 drops into both eyes every 6 (six) hours.   I am having [employer] "Lindley Radle" start on neomycin-polymyxin b-dexamethasone. I am also having him maintain his omega-3 acid ethyl esters, Dexcom G6 Transmitter, Dexcom G6 Receiver, TRUEplus Pen Needles, Dexcom G6 Sensor, aspirin EC, glipiZIDE, Vitamin D (Ergocalciferol), CYANOCOBALAMIN IJ, atorvastatin, ticagrelor, metoprolol succinate, NovoLIN 70/30 Kwikpen, and ranolazine.  Meds ordered this encounter  Medications   neomycin-polymyxin b-dexamethasone (MAXITROL) 3.5-10000-0.1 SUSP    Sig: Place 2 drops into both eyes every 6 (six) hours.    Dispense:  5 mL    Refill:  0     Follow-up: No follow-ups on file.  Scarlette Calico, MD

## 2022-05-30 NOTE — Patient Instructions (Signed)
Bacterial Conjunctivitis, Adult Bacterial conjunctivitis is an infection of the clear membrane that covers the white part of the eye and the inner surface of the eyelid (conjunctiva). When the blood vessels in the conjunctiva become inflamed, the eye becomes red or pink. The eye often feels irritated or itchy. Bacterial conjunctivitis spreads easily from person to person (is contagious). It also spreads easily from one eye to the other eye. What are the causes? This condition is caused by bacteria. You may get the infection if you come into close contact with: A person who is infected with the bacteria. Items that are contaminated with the bacteria, such as a face towel, contact lens solution, or eye makeup. What increases the risk? You are more likely to develop this condition if: You are exposed to other people who have the infection. You wear contact lenses. You have a sinus infection. You have had a recent eye injury or surgery. You have a weak body defense system (immune system). You have a medical condition that causes dry eyes. What are the signs or symptoms? Symptoms of this condition include: Thick, yellowish discharge from the eye. This may turn into a crust on the eyelid overnight and cause your eyelids to stick together. Tearing or watery eyes. Itchy eyes. Burning feeling in your eyes. Eye redness. Swollen eyelids. Blurred vision. How is this diagnosed? This condition is diagnosed based on your symptoms and medical history. Your health care provider may also take a sample of discharge from your eye to find the cause of your infection. How is this treated? This condition may be treated with: Antibiotic eye drops or ointment to clear the infection more quickly and prevent the spread of infection to others. Antibiotic medicines taken by mouth (orally) to treat infections that do not respond to drops or ointments or that last longer than 10 days. Cool, wet cloths (cool  compresses) placed on the eyes. Artificial tears applied 2-6 times a day. Follow these instructions at home: Medicines Take or apply your antibiotic medicine as told by your health care provider. Do not stop using the antibiotic, even if your condition improves, unless directed by your health care provider. Take or apply over-the-counter and prescription medicines only as told by your health care provider. Be very careful to avoid touching the edge of your eyelid with the eye-drop bottle or the ointment tube when you apply medicines to the affected eye. This will keep you from spreading the infection to your other eye or to other people. Managing discomfort Gently wipe away any drainage from your eye with a warm, wet washcloth or a cotton ball. Apply a clean, cool compress to your eye for 10-20 minutes, 3-4 times a day. General instructions Do not wear contact lenses until the inflammation is gone and your health care provider says it is safe to wear them again. Ask your health care provider how to sterilize or replace your contact lenses before you use them again. Wear glasses until you can resume wearing contact lenses. Avoid wearing eye makeup until the inflammation is gone. Throw away any old eye cosmetics that may be contaminated. Change or wash your pillowcase every day. Do not share towels or washcloths. This may spread the infection. Wash your hands often with soap and water for at least 20 seconds and especially before touching your face or eyes. Use paper towels to dry your hands. Avoid touching or rubbing your eyes. Do not drive or use heavy machinery if your vision is blurred. Contact   a health care provider if: You have a fever. Your symptoms do not get better after 10 days. Get help right away if: You have a fever and your symptoms suddenly get worse. You have severe pain when you move your eye. You have facial pain, redness, or swelling. You have a sudden loss of  vision. Summary Bacterial conjunctivitis is an infection of the clear membrane that covers the white part of the eye and the inner surface of the eyelid (conjunctiva). Bacterial conjunctivitis spreads easily from eye to eye and from person to person (is contagious). Wash your hands often with soap and water for at least 20 seconds and especially before touching your face or eyes. Use paper towels to dry your hands. Take or apply your antibiotic medicine as told by your health care provider. Do not stop using the antibiotic even if your condition improves. Contact a health care provider if you have a fever or if your symptoms do not get better after 10 days. Get help right away if you have a sudden loss of vision. This information is not intended to replace advice given to you by your health care provider. Make sure you discuss any questions you have with your health care provider. Document Revised: 06/28/2020 Document Reviewed: 06/28/2020 Elsevier Patient Education  2023 Elsevier Inc.  

## 2022-05-31 ENCOUNTER — Other Ambulatory Visit (HOSPITAL_COMMUNITY): Payer: Self-pay

## 2022-05-31 ENCOUNTER — Encounter: Payer: Self-pay | Admitting: Internal Medicine

## 2022-05-31 ENCOUNTER — Ambulatory Visit (INDEPENDENT_AMBULATORY_CARE_PROVIDER_SITE_OTHER): Payer: 59 | Admitting: Internal Medicine

## 2022-05-31 ENCOUNTER — Telehealth: Payer: Self-pay | Admitting: Internal Medicine

## 2022-05-31 VITALS — BP 114/70 | HR 80 | Ht 74.0 in | Wt 267.0 lb

## 2022-05-31 DIAGNOSIS — E1142 Type 2 diabetes mellitus with diabetic polyneuropathy: Secondary | ICD-10-CM

## 2022-05-31 DIAGNOSIS — Z794 Long term (current) use of insulin: Secondary | ICD-10-CM

## 2022-05-31 DIAGNOSIS — E1165 Type 2 diabetes mellitus with hyperglycemia: Secondary | ICD-10-CM | POA: Diagnosis not present

## 2022-05-31 DIAGNOSIS — E1159 Type 2 diabetes mellitus with other circulatory complications: Secondary | ICD-10-CM | POA: Diagnosis not present

## 2022-05-31 MED ORDER — GLIPIZIDE ER 10 MG PO TB24
10.0000 mg | ORAL_TABLET | Freq: Every day | ORAL | 3 refills | Status: DC
  Filled 2022-05-31: qty 90, 90d supply, fill #0
  Filled 2022-06-12 – 2022-06-15 (×2): qty 30, 30d supply, fill #0
  Filled 2022-07-01 – 2022-07-16 (×2): qty 30, 30d supply, fill #1
  Filled 2022-08-14: qty 30, 30d supply, fill #2

## 2022-05-31 MED ORDER — TRUEPLUS PEN NEEDLES 31G X 5 MM MISC
1.0000 | Freq: Two times a day (BID) | 3 refills | Status: AC
  Filled 2022-05-31: qty 100, 25d supply, fill #0

## 2022-05-31 MED ORDER — EMPAGLIFLOZIN 10 MG PO TABS
10.0000 mg | ORAL_TABLET | Freq: Every day | ORAL | 3 refills | Status: DC
  Filled 2022-05-31: qty 30, 30d supply, fill #0
  Filled 2022-06-04: qty 90, 90d supply, fill #0

## 2022-05-31 MED ORDER — DEXCOM G7 SENSOR MISC
1.0000 | 3 refills | Status: DC
  Filled 2022-05-31: qty 3, 30d supply, fill #0
  Filled 2022-06-17 – 2022-06-24 (×2): qty 3, 30d supply, fill #1
  Filled 2022-07-01 – 2022-07-19 (×4): qty 3, 30d supply, fill #2
  Filled 2022-08-10: qty 3, 30d supply, fill #3
  Filled 2022-09-02 – 2022-09-12 (×2): qty 3, 30d supply, fill #4
  Filled 2022-10-12: qty 3, 30d supply, fill #5
  Filled 2022-11-15: qty 3, 30d supply, fill #6
  Filled 2022-12-18: qty 3, 30d supply, fill #7
  Filled 2023-01-25: qty 3, 30d supply, fill #8
  Filled 2023-02-25: qty 3, 30d supply, fill #9
  Filled 2023-03-30: qty 3, 30d supply, fill #10
  Filled 2023-04-27 – 2023-05-02 (×4): qty 3, 30d supply, fill #11

## 2022-05-31 MED ORDER — NOVOLIN 70/30 FLEXPEN RELION (70-30) 100 UNIT/ML ~~LOC~~ SUPN
PEN_INJECTOR | SUBCUTANEOUS | 3 refills | Status: DC
  Filled 2022-05-31: qty 45, 100d supply, fill #0

## 2022-05-31 NOTE — Progress Notes (Signed)
Name: Micheal Neal  Age/ Sex: 52 y.o., male   MRN/ DOB: [id number], 1970/10/30     PCP: [name], MD   Reason for Endocrinology Evaluation: Type 2 Diabetes Mellitus  Initial Endocrine Consultative Visit: 05/31/2022    PATIENT IDENTIFIER: Micheal Neal is a 52 y.o. male with a past medical history of CAD (s/p PCI 05/2022) , DM. The patient has followed with Endocrinology clinic since 05/31/2022 for consultative assistance with Neal of his diabetes.  DIABETIC HISTORY:  Micheal Neal was diagnosed with DM 2012, he has been on metformin in the past but developed GI upset , he has been on insulin since his diagnosis . His hemoglobin A1c has ranged from 8.7% in 2019, peaking at 12.5% in 2024.   GAD-65 < 1 in 2012  He was always on basal/prandial insulin but due to cost issues he was switched to insulin mix during hospitalization for MI in 05/2022 Glipizide was started during hospitalization 05/2022  Mother with CHF   SUBJECTIVE:     Today (05/31/2022): Micheal Neal.  He  is accompanied by his wife . He checks his blood sugars multiple  times daily. The patient has  had hypoglycemic episodes since the last clinic visit.  Presented to the ED for orthostatic hypotension 05/27/2022 Denies nausea, vomiting or diarrhea  Continues with dizziness    HOME DIABETES REGIMEN:  Novolin mix 25 units twice daily Glipizide 10 mg XL    Statin: Yes ACE-I/ARB: no   CONTINUOUS GLUCOSE MONITORING RECORD INTERPRETATION    Dates of Recording: 2/17-05/31/2022  Sensor description:dexcom  Results statistics:   CGM use % of time 64  Average and SD 185/54  Time in range       52 %  % Time Above 180 34  % Time above 250 13  % Time Below target <1   Glycemic patterns summary: BG's are optimal overnight, and trend up during the day  Hyperglycemic episodes postprandial  Hypoglycemic episodes occurred variable  Overnight periods: Trends  down     DIABETIC COMPLICATIONS: Microvascular complications:  Neuropathy  Denies: CKD Last Eye Exam: scheduled next week  Macrovascular complications:  CAD Denies:  CVA, PVD   HISTORY:  Past Medical History:  Past Medical History:  Diagnosis Date   Abnormal EKG    Contusion of left foot    Diabetes mellitus without complication ([place])    Dizziness    Near syncope    Palpitations    Vertigo    Past Surgical History:  Past Surgical History:  Procedure Laterality Date   CORONARY STENT INTERVENTION N/A 05/20/2022   Procedure: CORONARY STENT INTERVENTION;  Surgeon: Lorretta Harp, MD;  Location: [facility];  Service: Cardiovascular;  Laterality: N/A;   LEFT HEART CATH AND CORONARY ANGIOGRAPHY N/A 05/20/2022   Procedure: LEFT HEART CATH AND CORONARY ANGIOGRAPHY;  Surgeon: Lorretta Harp, MD;  Location: [facility];  Service: Cardiovascular;  Laterality: N/A;   NO PAST SURGERIES     Social History:  reports that he has never smoked. He has never used smokeless tobacco. He reports that he does not drink alcohol and does not use drugs. Family History:  Family History  Problem Relation Age of Onset   Heart disease Mother    Breast cancer Maternal Grandmother        diagnosed late 27's   Breast cancer Maternal Aunt      HOME MEDICATIONS: Allergies as of 05/31/2022  No Known Allergies      Medication List        Accurate as of May 31, 2022 12:49 PM. If you have any questions, ask your nurse or doctor.          STOP taking these medications    Dexcom G6 Receiver Devi Stopped by: Dorita Sciara, MD   Dexcom G6 Transmitter Misc Stopped by: Dorita Sciara, MD       TAKE these medications    aspirin EC 81 MG tablet Take 1 tablet (81 mg total) by mouth daily. Swallow whole.   atorvastatin 80 MG tablet Commonly known as: LIPITOR Take 1 tablet (80 mg total) by mouth daily.   Brilinta 90 MG Tabs tablet Generic drug:  ticagrelor Take 1 tablet (90 mg total) by mouth 2 (two) times daily.   CYANOCOBALAMIN IJ Inject 1 Dose as directed once a week.   Dexcom G7 Sensor Misc 1 Device by Does not apply route as directed. What changed:  how much to take how to take this when to take this Changed by: Dorita Sciara, MD   empagliflozin 10 MG Tabs tablet Commonly known as: Jardiance Take 1 tablet (10 mg total) by mouth daily. Started by: Dorita Sciara, MD   glipiZIDE 10 MG 24 hr tablet Commonly known as: Glucotrol XL Take 1 tablet (10 mg total) by mouth daily with breakfast.   metoprolol succinate 25 MG 24 hr tablet Commonly known as: TOPROL-XL Take 1/2 tablet (12.5 mg total) by mouth daily.   neomycin-polymyxin b-dexamethasone 3.5-10000-0.1 Susp Commonly known as: MAXITROL Place 2 drops into both eyes every 6 (six) hours.   NovoLIN 70/30 Kwikpen (70-30) 100 UNIT/ML KwikPen Generic drug: insulin isophane & regular human KwikPen Inject 25 Units into the skin daily before breakfast AND 20 Units daily before supper. After breakfast and dinner. What changed: See the new instructions. Changed by: Dorita Sciara, MD   omega-3 acid ethyl esters 1 g capsule Commonly known as: LOVAZA Take 2 capsules (2 g total) by mouth 2 (two) times daily.   ranolazine 500 MG 12 hr tablet Commonly known as: RANEXA Take 1 tablet (500 mg total) by mouth 2 (two) times daily.   TRUEplus Pen Needles 31G X 5 MM Misc Generic drug: Insulin Pen Needle 1 Device by Other route in the morning and at bedtime. USE AS DIRECTED FOUR TIMES DAILY What changed:  how much to take how to take this when to take this Changed by: Dorita Sciara, MD   Vitamin D (Ergocalciferol) 1.25 MG (50000 UNIT) Caps capsule Commonly known as: DRISDOL Take 1 capsule (50,000 Units total) by mouth every 7 (seven) days.         OBJECTIVE:   Vital Signs: BP 114/70 (BP Location: Left Arm, Patient Position: Sitting, Cuff  Size: Large)   Pulse 80   Ht '6\' 2"'$  (1.88 m)   Wt 267 lb (121.1 kg)   SpO2 96%   BMI 34.28 kg/m   Wt Readings from Last 3 Encounters:  05/31/22 267 lb (121.1 kg)  05/30/22 266 lb (120.7 kg)  05/27/22 272 lb 0.8 oz (123.4 kg)     Exam: General: Pt appears well and is in NAD  Neck: General: Supple without adenopathy. Thyroid: Thyroid size normal.  No goiter or nodules appreciated.   Lungs: Clear with good BS bilat   Heart: RRR   Abdomen:  soft, nontender  Extremities: No pretibial edema.   Neuro: MS is good with  appropriate affect, pt is alert and Ox3    DM foot exam: 05/31/2022  The skin of the feet is without sores or ulcerations, discolored toe noted  The pedal pulses are undetectable  The sensation is absent  to a screening 5.07, 10 gram monofilament bilaterally          DATA REVIEWED:  Lab Results  Component Value Date   HGBA1C 12.5 (H) 05/19/2022   HGBA1C 10.3 (H) 11/06/2021   HGBA1C 10.0 (A) 11/06/2021    Latest Reference Range & Units 05/28/22 04:00  Sodium 135 - 145 mmol/L 137  Potassium 3.5 - 5.1 mmol/L 3.9  Chloride 98 - 111 mmol/L 106  CO2 22 - 32 mmol/L 25  Glucose 70 - 99 mg/dL 141 (H)  BUN 6 - 20 mg/dL 17  Creatinine 0.61 - 1.24 mg/dL 1.18  Calcium 8.9 - 10.3 mg/dL 8.3 (L)  Anion gap 5 - 15  6  Alkaline Phosphatase 38 - 126 U/L 64  Albumin 3.5 - 5.0 g/dL 3.2 (L)  AST 15 - 41 U/L 26  ALT 0 - 44 U/L 27  Total Protein 6.5 - 8.1 g/dL 6.9  Total Bilirubin 0.3 - 1.2 mg/dL 0.9  GFR, Estimated >60 mL/min >60   ASSESSMENT / PLAN / RECOMMENDATIONS:   1) Type 2 Diabetes Mellitus, Poorly controlled, With Neuropathic and  macrovascular complications - Most recent A1c of 12.5 %. Goal A1c < 7.0 %.    -Poorly controlled diabetes -He was on MDI regimen but due to cost issues he was switched to insulin mix, he was also started on glipizide during hospitalization, we did discuss this combination increases the risk of hypoglycemia -I have recommended starting  SGLT2 inhibitors due to recent CAD, we discussed benefits as well as the risk of dizziness and genital infections, patient encouraged to stay hydrated. -He has been taking insulin after the meals, I have advised the patient to take his insulin 20 minutes prior to meals -Intolerant to metformin -I will reduce his suppertime insulin dose as below  MEDICATIONS: Start Jardiance 10 mg daily Change Novolin mix to 25 units before breakfast and 20 units before supper Continue glipizide 10 mg XL, 1 tablet before breakfast  EDUCATION / INSTRUCTIONS: BG monitoring instructions: Patient is instructed to check his blood sugars 3 times a day. Call [facility] if: BG persistently < 70  I reviewed the Rule of 15 for the treatment of hypoglycemia in detail with the patient. Literature supplied.    2) Diabetic complications:  Eye: Does not have known diabetic retinopathy.  Neuro/ Feet: Does  have known diabetic peripheral neuropathy .  Renal: Patient does not have known baseline CKD. He   is not on an ACEI/ARB at present.      F/U in 3-4 months     Signed electronically by: Mack Guise, MD  [facility] End[facility]  [address] [place], [place] [address] Phone: [phone] FAX: [phone]   CC: [name], MD [place] [address] Phone: [phone]  Fax: [phone]  Return to Endocrinology clinic as below: Future Appointments  Date Time Provider [place]  06/04/2022  8:25 AM Barbarann Ehlers Junius Creamer., NP [facility]  06/06/2022  9:40 AM Ailene Ards, NP [facility] None  06/06/2022  1:15 PM Felipa Furnace, DPM [facility]  06/20/2022  9:00 AM [facility] NURSE [facility] None  08/22/2022  8:00 AM Burnell Blanks, MD [facility]  08/30/2022 12:10 PM Hilario Robarts, Melanie Crazier, MD [facility] None

## 2022-05-31 NOTE — Patient Instructions (Addendum)
Start Jardiance 10 mg,1 tablet before Breakfast  Change  Novolog Mix to 25 units before Breakfast and 20 units Before Supper Continue Glipizide 10 mg XL, 1 tablet before breakfast  HOW TO TREAT LOW BLOOD SUGARS (Blood sugar LESS THAN 70 MG/DL) Please follow the RULE OF 15 for the treatment of hypoglycemia treatment (when your (blood sugars are less than 70 mg/dL)   STEP 1: Take 15 grams of carbohydrates when your blood sugar is low, which includes:  3-4 GLUCOSE TABS  OR 3-4 OZ OF JUICE OR REGULAR SODA OR ONE TUBE OF GLUCOSE GEL    STEP 2: RECHECK blood sugar in 15 MINUTES STEP 3: If your blood sugar is still low at the 15 minute recheck --> then, go back to STEP 1 and treat AGAIN with another 15 grams of carbohydrates.

## 2022-05-31 NOTE — Telephone Encounter (Signed)
Patient called back to return [place] Lea's call. She had called with appointment options and offered to get him in today. He was unable to take the 12pm appointment today due to a different doctor's appointment at 11:30am. Please give a callback at [phone].

## 2022-05-31 NOTE — Telephone Encounter (Signed)
Pt is scheduled at [facility] care for 06/03/22 he is aware of this.

## 2022-06-01 ENCOUNTER — Other Ambulatory Visit (HOSPITAL_COMMUNITY): Payer: Self-pay

## 2022-06-03 ENCOUNTER — Other Ambulatory Visit (HOSPITAL_COMMUNITY): Payer: Self-pay

## 2022-06-03 LAB — HM DIABETES EYE EXAM

## 2022-06-03 MED ORDER — FLUOROMETHOLONE 0.1 % OP SUSP
OPHTHALMIC | 0 refills | Status: DC
  Filled 2022-06-03: qty 5, 20d supply, fill #0

## 2022-06-03 NOTE — Progress Notes (Unsigned)
Office Visit    Patient Name: Micheal Neal Date of Encounter: 06/04/2022  Primary Care Provider:  [name], MD Primary Cardiologist:  Lauree Chandler, MD Primary Electrophysiologist: None  Chief Complaint    Micheal Neal is a 52 y.o. male with PMH of CAD s/p NSTEMI 2024 with DES to PL branch, orthostasis, DM type II, palpitations, HLD who presents for posthospital follow-up.  Past Medical History    Past Medical History:  Diagnosis Date   Abnormal EKG    Contusion of left foot    Diabetes mellitus without complication ([place])    Dizziness    Near syncope    Palpitations    Vertigo    Past Surgical History:  Procedure Laterality Date   CORONARY STENT INTERVENTION N/A 05/20/2022   Procedure: CORONARY STENT INTERVENTION;  Surgeon: Lorretta Harp, MD;  Location: [facility];  Service: Cardiovascular;  Laterality: N/A;   LEFT HEART CATH AND CORONARY ANGIOGRAPHY N/A 05/20/2022   Procedure: LEFT HEART CATH AND CORONARY ANGIOGRAPHY;  Surgeon: Lorretta Harp, MD;  Location: [facility];  Service: Cardiovascular;  Laterality: N/A;   NO PAST SURGERIES      Allergies  No Known Allergies  History of Present Illness    Micheal Neal  is a 52 year old male with the above mention past medical history who presents today for posthospital follow-up of complaint of weakness.  Micheal Neal was seen initially by Dr. Angelena Form PCP referral in 12/2021 for complaint of orthostatic hypotension.  He had been seen in the ED at [facility] on 11/18/2021 with near syncope.  He reported symptoms occur with standing and with lying down with head movement.  BP was initially in the 70s and troponins were negative.  He had positive orthostatic vital signs.  He was given IV fluids and underwent nuclear stress test showed no ischemia and normal LV function.2D echo was ordered to assess LVEF and valve function.  During follow-up with Dr. Angelena Form a 2D echo was ordered and completed  02/15/2022 that revealed normal LVEF 60 to 65%, no RWMA, mild LVH, normal diastolic parameters, normal RV, no significant valve disease, mild dilatation of the ascending aorta at 39 mm.  14-day ZIO monitor was also completed showing predominant rhythm of sinus with 6 SVT runs and no pauses or atrial fibrillation.  He was seen in the ED on 05/19/2022 with complaint of 2 syncopal events.  Patient's blood glucose was elevated to 528 and was given 20 units of NovoLog and 1 L of IV fluids.  He was positive for orthostatic vital signs.  He reported some shortness of breath with sweatiness and initial EKG showed possible ST elevations inferiorly.  Troponins increased to 200's and he was started on a heparin drip with left heart cath completed that revealed high-grade stenosis in PLA branch that was treated with DES x 1 with high-grade mid AV groove disease and circumflex that is too small for PCI.  He was started on Brilinta and aspirin and felt well to be discharged.  He presented to the ED at [facility] on 05/27/2022 with complaint of weakness with shortness of breath and sweatiness.  Cardiology was consulted and EKG showed sinus rhythm with T wave inversion inferolaterally and new in V6.  Blood glucose was 70 and troponins were 190, 186.  He denied chest pain and troponins may be related to previous peak 1 week prior post-cath.  Case was discussed with Dr. Burt Knack again for intervening  on left circumflex lesion which was deemed to be too small.  Consideration was made for possible FFR and trial of Ranexa was provided with caution given hypotension.  There were no plans for repeat cath at that time and patient was discharged in stable condition.  Micheal Neal presents today for posthospital follow-up.  Since last being seen in the office patient reports that he is not experienced any chest discomfort or hypoglycemic episodes since being discharged.  He continues to experience dizziness with standing as well as loss of  balance at times.  Orthostatic vitals were obtained and were positive from lying to sitting and decreased from a systolic of [id number] with sitting to standing with dizziness.  He reports 1 episode of shortness of breath with lying down last week that was related to his blood sugar being low.  He is continuing to stay active around his house and is able to do regular chores such as carrying groceries in and washing and cleaning.  He is also able to climb his stairs without any difficulty he denies any palpitations and reports no syncope or presyncope.  He is compliant with his current medications however has not started ranolazine due to insurance coverage concerns.  He is on a Dexcom for monitoring his blood sugars.  During our visit today we discussed importance of maintaining good fluid intake and also maintaining good glycemic control.  Patient denies chest pain, palpitations, dyspnea, PND, orthopnea, nausea, vomiting, dizziness, syncope, edema, weight gain, or early satiety.   Home Medications    Current Outpatient Medications  Medication Sig Dispense Refill   aspirin EC 81 MG tablet Take 1 tablet (81 mg total) by mouth daily. Swallow whole. 90 tablet 1   atorvastatin (LIPITOR) 80 MG tablet Take 1 tablet (80 mg total) by mouth daily. 90 tablet 3   Continuous Blood Gluc Sensor (DEXCOM G7 SENSOR) MISC Use as directed. Change every 10 days. 9 each 3   CYANOCOBALAMIN IJ Inject 1 Dose as directed every 30 (thirty) days.     empagliflozin (JARDIANCE) 10 MG TABS tablet Take 1 tablet (10 mg total) by mouth daily. 90 tablet 3   fluorometholone (FML) 0.1 % ophthalmic suspension Instill 1 drop into right eye four times a day 10 mL 0   glipiZIDE (GLUCOTROL XL) 10 MG 24 hr tablet Take 1 tablet (10 mg total) by mouth daily with breakfast. 90 tablet 3   insulin isophane & regular human KwikPen (NOVOLIN 70/30 KWIKPEN) (70-30) 100 UNIT/ML KwikPen Inject 25 Units into the skin daily before breakfast AND 20 Units daily  before supper. After breakfast and dinner. 45 mL 3   Insulin Pen Needle (TRUEPLUS PEN NEEDLES) 31G X 5 MM MISC USE AS DIRECTED FOUR TIMES DAILY 200 each 3   metoprolol succinate (TOPROL-XL) 25 MG 24 hr tablet Take 1/2 tablet (12.5 mg total) by mouth daily. 45 tablet 1   midodrine (PROAMATINE) 10 MG tablet Take 1 tablet (10 mg total) by mouth 3 (three) times daily. 90 tablet 1   omega-3 acid ethyl esters (LOVAZA) 1 g capsule Take 2 capsules (2 g total) by mouth 2 (two) times daily. 360 capsule 1   ticagrelor (BRILINTA) 90 MG TABS tablet Take 1 tablet (90 mg total) by mouth 2 (two) times daily. 60 tablet 0   Vitamin D, Ergocalciferol, (DRISDOL) 1.25 MG (50000 UNIT) CAPS capsule Take 1 capsule (50,000 Units total) by mouth every 7 (seven) days. 12 capsule 0   No current facility-administered medications for this  visit.     Review of Systems  Please see the history of present illness.    (+) Dizziness (+) Shortness of breath  All other systems reviewed and are otherwise negative except as noted above.  Physical Exam    Wt Readings from Last 3 Encounters:  06/04/22 270 lb (122.5 kg)  05/31/22 267 lb (121.1 kg)  05/30/22 266 lb (120.7 kg)   VS: Vitals:   06/04/22 0845  BP: (!) 80/56  Pulse: 67  SpO2: 96%  ,Body mass index is 34.67 kg/m.  Constitutional:      Appearance: Healthy appearance. Not in distress.  Neck:     Vascular: JVD normal.  Pulmonary:     Effort: Pulmonary effort is normal.     Breath sounds: No wheezing. No rales. Diminished in the bases Cardiovascular:     Normal rate. Regular rhythm. Normal S1. Normal S2.      Murmurs: There is no murmur.  Edema:    Peripheral edema absent.  Abdominal:     Palpations: Abdomen is soft non tender. There is no hepatomegaly.  Skin:    General: Skin is warm and dry.  Neurological:     General: No focal deficit present.     Mental Status: Alert and oriented to person, place and time.     Cranial Nerves: Cranial nerves are  intact.  EKG/LABS/ Recent Cardiac Studies    ECG personally reviewed by me today -none completed today  Lab Results  Component Value Date   WBC 5.4 05/28/2022   HGB 12.7 (L) 05/28/2022   HCT 39.9 05/28/2022   MCV 86.6 05/28/2022   PLT 207 05/28/2022   Lab Results  Component Value Date   CREATININE 1.18 05/28/2022   BUN 17 05/28/2022   NA 137 05/28/2022   K 3.9 05/28/2022   CL 106 05/28/2022   CO2 25 05/28/2022   Lab Results  Component Value Date   ALT 27 05/28/2022   AST 26 05/28/2022   ALKPHOS 64 05/28/2022   BILITOT 0.9 05/28/2022   Lab Results  Component Value Date   CHOL 241 (H) 05/21/2022   HDL 31 (L) 05/21/2022   LDLCALC UNABLE TO CALCULATE IF TRIGLYCERIDE OVER 400 mg/dL 05/21/2022   LDLDIRECT 121 (H) 05/21/2022   TRIG 455 (H) 05/21/2022   CHOLHDL 7.8 05/21/2022    Lab Results  Component Value Date   HGBA1C 12.5 (H) 05/19/2022    Cardiac Studies & Procedures   CARDIAC CATHETERIZATION  CARDIAC CATHETERIZATION 05/20/2022  Narrative   Prox LAD lesion is 60% stenosed.   1st Mrg lesion is 50% stenosed.   Prox Cx to Mid Cx lesion is 95% stenosed.   Mid Cx to Dist Cx lesion is 90% stenosed.   Prox RCA lesion is 50% stenosed.   RPAV lesion is 95% stenosed.   RPDA lesion is 70% stenosed.   A drug-eluting stent was successfully placed using a SYNERGY XD 2.50X16.   Post intervention, there is a 0% residual stenosis.   The left ventricular systolic function is normal.   LV end diastolic pressure is normal.   The left ventricular ejection fraction is 55-65% by visual estimate.  Lacory Warrix is a 52 y.o. male   [id number] LOCATION:  FACILITY: [facility] PHYSICIAN: [name], M.D. 09/11/1970   DATE OF PROCEDURE:  05/20/2022  DATE OF DISCHARGE:     CARDIAC CATHETERIZATION / PCI DES PLA    History obtained from chart review.52 y.o. male with history of uncontrolled DM, orthostatic  hypotension and hyperlipidemia who presented after an episode of  syncope.  His troponins rose to the 200 level.  His EKG did show subtle inferior ST segment elevation.Marland Kitchen  He presents now for coronary angiography.   PROCEDURE DESCRIPTION:  The patient was brought to the second floor  Cardiac cath lab in the postabsorptive state. He was premedicated with IV Versed and fentanyl. His right wrist was prepped and shaved in usual sterile fashion. Xylocaine 1% was used for local anesthesia. A 6 French sheath was inserted into the right radial artery using standard Seldinger technique. The patient received 6000 units  of heparin intravenously.  A 5 [place] TIG catheter and right Judkins catheters were used for selective coronary angiography and left ventriculography respectively.  Isovue dye is used for the entirety of the case (115 cc of contrast total to patient).  Retrograde aortic, left ventricular and pullback pressures were recorded.  LVEDP was measured at 5 mmHg.  Radial cocktail administered via the SideArm sheath.  Patient received a total of 15,000's of heparin with an ending ACT of 358.  Brilinta 180 mg p.o. loading dose was administered.  Using a 6 [place] RCA guiding catheter along with a 0.14 [place] guidewire and a 2 mm x 12 mm balloon the distal RCA was crossed with little difficulty and predilated with a 2 mm balloon.  Following this a 2.5 mm x 60 mm long Synergy drug-eluting stent was then deployed at 14 atm (2.66 mm) resulting in reduction of a 95% ostial PLA branch stenosis to 0% residual.  There was some plaque shift in the origin of the small PDA resulting in approximately 70% ostial stenosis however this is only a 1.5 mm vessel.  Impression Successful PLA branch PCI drug-eluting stenting with excellent angiographic result.  The patient does have high-grade mid nondominant AV groove circumflex stenosis however this is a small vessel.  He has moderate disease in his proximal LAD that did not appear to be hemodynamically significant, and normal LV  function.  I reviewed the angiographic results with Dr. Peter Martinique, his attending cardiologist, and we both feel that aggressive medical therapy is the best strategy moving forward to treat his residual CAD.  He will need to be on DAPT uninterrupted for least 12 months.  The sheath was removed and a TR band was placed on the right wrist to achieve patent hemostasis.  The patient left lab stable condition.  [name]. MD, [facility] 05/20/2022 12:36 PM  Findings Coronary Findings Diagnostic  Dominance: Right  Left Anterior Descending Prox LAD lesion is 60% stenosed.  Left Circumflex Prox Cx to Mid Cx lesion is 95% stenosed. Mid Cx to Dist Cx lesion is 90% stenosed.  First Obtuse Marginal Branch 1st Mrg lesion is 50% stenosed.  Right Coronary Artery Prox RCA lesion is 50% stenosed.  Right Posterior Descending Artery RPDA lesion is 70% stenosed.  Right Posterior Atrioventricular Artery RPAV lesion is 95% stenosed. Vessel is the culprit lesion.  Intervention  RPAV lesion Stent Lesion crossed with guidewire. Pre-stent angioplasty was performed. A drug-eluting stent was successfully placed using a SYNERGY XD 2.50X16. Stent strut is well apposed. Stent does not overlap previously placed stentPost-stent angioplasty was not performed. Post-Intervention Lesion Assessment The intervention was successful. Pre-interventional TIMI flow is 3. Post-intervention TIMI flow is 3. No complications occurred at this lesion. There is a 0% residual stenosis post intervention.   STRESS TESTS  MYOCARDIAL PERFUSION IMAGING 11/27/2021  Narrative   The study is normal. The study  is low risk.   No ST deviation was noted.   Left ventricular function is normal. End diastolic cavity size is normal.   Prior study not available for comparison.   ECHOCARDIOGRAM  ECHOCARDIOGRAM COMPLETE 05/21/2022  Narrative ECHOCARDIOGRAM REPORT    Patient Name:   NATAS MUSCARI Date of Exam: 05/21/2022 Medical Rec  #:  [id number]     Height:       74.0 in Accession #:    [id number]    Weight:       272.0 lb Date of Birth:  02-28-1971      BSA:          2.477 m Patient Age:    22 years      BP:           102/73 mmHg Patient Gender: M             HR:           75 bpm. Exam Location:  Inpatient  Procedure: 2D Echo, Cardiac Doppler, Color Doppler and Intracardiac Opacification Agent  Indications:    CAD Native Vessel I25.10  History:        Patient has prior history of Echocardiogram examinations, most recent 02/15/2022. Signs/Symptoms:Hypotension and Syncope; Risk Factors:Diabetes and Dyslipidemia.  Sonographer:    Ronny Flurry Referring Phys: [place]   1. Left ventricular ejection fraction, by estimation, is 60 to 65%. The left ventricle has normal function. The left ventricle has no regional wall motion abnormalities. There is mild asymmetric left ventricular hypertrophy of the septal segment. Left ventricular diastolic parameters are consistent with Grade I diastolic dysfunction (impaired relaxation). 2. Right ventricular systolic function is normal. The right ventricular size is normal. 3. The mitral valve was not well visualized. No evidence of mitral valve regurgitation. No evidence of mitral stenosis. 4. The aortic valve is tricuspid. Aortic valve regurgitation is not visualized. No aortic stenosis is present. 5. The inferior vena cava is normal in size with greater than 50% respiratory variability, suggesting right atrial pressure of 3 mmHg.  Comparison(s): No significant change from prior study. Aortic dimensions are within normal limits to age and BSA in this study.  FINDINGS Left Ventricle: Left ventricular ejection fraction, by estimation, is 60 to 65%. The left ventricle has normal function. The left ventricle has no regional wall motion abnormalities. Definity contrast agent was given IV to delineate the left ventricular endocardial borders. The left  ventricular internal cavity size was normal in size. There is mild asymmetric left ventricular hypertrophy of the septal segment. Left ventricular diastolic parameters are consistent with Grade I diastolic dysfunction (impaired relaxation).  Right Ventricle: The right ventricular size is normal. No increase in right ventricular wall thickness. Right ventricular systolic function is normal.  Left Atrium: Left atrial size was normal in size.  Right Atrium: Right atrial size was normal in size.  Pericardium: There is no evidence of pericardial effusion.  Mitral Valve: The mitral valve was not well visualized. No evidence of mitral valve regurgitation. No evidence of mitral valve stenosis.  Tricuspid Valve: The tricuspid valve is normal in structure. Tricuspid valve regurgitation is not demonstrated. No evidence of tricuspid stenosis.  Aortic Valve: The aortic valve is tricuspid. Aortic valve regurgitation is not visualized. No aortic stenosis is present. Aortic valve mean gradient measures 4.0 mmHg. Aortic valve peak gradient measures 7.4 mmHg. Aortic valve area, by VTI measures 3.64 cm.  Pulmonic Valve: The pulmonic valve was not well visualized. Pulmonic  valve regurgitation is not visualized. No evidence of pulmonic stenosis.  Aorta: The aortic root and ascending aorta are structurally normal, with no evidence of dilitation.  Venous: The inferior vena cava is normal in size with greater than 50% respiratory variability, suggesting right atrial pressure of 3 mmHg.  IAS/Shunts: No atrial level shunt detected by color flow Doppler.   LEFT VENTRICLE PLAX 2D LVIDd:         4.40 cm   Diastology LVIDs:         2.90 cm   LV e' medial:    6.96 cm/s LV PW:         0.80 cm   LV E/e' medial:  12.1 LV IVS:        1.20 cm   LV e' lateral:   10.90 cm/s LVOT diam:     2.20 cm   LV E/e' lateral: 7.7 LV SV:         95 LV SV Index:   38 LVOT Area:     3.80 cm   RIGHT VENTRICLE              IVC RV S prime:     11.70 cm/s  IVC diam: 1.30 cm TAPSE (M-mode): 1.8 cm  LEFT ATRIUM             Index        RIGHT ATRIUM           Index LA diam:        3.60 cm 1.45 cm/m   RA Area:     11.60 cm LA Vol (A2C):   49.1 ml 19.82 ml/m  RA Volume:   25.20 ml  10.17 ml/m LA Vol (A4C):   41.6 ml 16.79 ml/m LA Biplane Vol: 46.6 ml 18.81 ml/m AORTIC VALVE AV Area (Vmax):    3.66 cm AV Area (Vmean):   3.40 cm AV Area (VTI):     3.64 cm AV Vmax:           136.00 cm/s AV Vmean:          92.300 cm/s AV VTI:            0.262 m AV Peak Grad:      7.4 mmHg AV Mean Grad:      4.0 mmHg LVOT Vmax:         131.00 cm/s LVOT Vmean:        82.600 cm/s LVOT VTI:          0.251 m LVOT/AV VTI ratio: 0.96  AORTA Ao Root diam: 3.20 cm Ao Asc diam:  3.20 cm  MITRAL VALVE MV Area (PHT): 3.65 cm    SHUNTS MV Decel Time: 208 msec    Systemic VTI:  0.25 m MV E velocity: 84.40 cm/s  Systemic Diam: 2.20 cm MV A velocity: 80.50 cm/s MV E/A ratio:  1.05  Rudean Haskell MD Electronically signed by Rudean Haskell MD Signature Date/Time: 05/21/2022/2:05:15 PM    Final    MONITORS  LONG TERM MONITOR-LIVE TELEMETRY (3-14 DAYS) 02/25/2022  Narrative Patch Wear Time:  14 days and 0 hours (2023-10-27T11:34:54-0400 to 2023-11-10T10:34:54-0500)  Sinus rhythm. Patient had a min HR of 64 bpm, max HR of 147 bpm, and avg HR of 86 bpm. 6 Supraventricular Tachycardia runs occurred, the longest lasting 11.4 seconds. Rare premature atrial contractions (<1.0%) Rare premature ventricular contractions (<1.0%)           Assessment & Plan    1.  Coronary  artery disease: -s/p LHC performed 05/19/2018 due to NSTEMI with DES placed to PL branch and residual CAD treated medically.  He was readmitted on 05/27/2022 with complaint of weakness and diaphoresis.  EKG revealed TWI in V6 which was new.  Case discussed with Dr. Burt Knack who felt recath was not necessary at this time. -Today patient reports no  recurrence of weakness or diaphoresis since being discharged.  He did report 1 episode of shortness of breath that was associated with hypoglycemia. -Continue GDMT with Toprol-XL 12.5 mg daily, atorvastatin 80 mg daily, ASA 81 mg, Brilinta 90 mg -Patient never started Ranexa due to no insurance coverage for this medication.  2.  Orthostatic hypotension: -Patient had previous positive orthostatic vitals and advised to change nutrition and increase fluid intake. -Today patient reports ongoing dizziness especially with sitting to standing. -Blood pressure today was 123456 and systolic dipped to [id number] from sitting to standing. -We will start midodrine 10 mg 3 times daily -He was advised to be liberal with salt and increase his fluid intake. -He was also instructed to obtain an abdominal binder and wear compression socks during the day.  3.  Uncontrolled DM type II: -CBG during NSTEMI in the 500s and most recent CBG was 70s on 05/27/2022 -Today patient reports better glycemic control and is tolerating Jardiance 10 mg without any adverse reactions.  4.  Obesity: -Patient's BMI is 34.67 -He is anxious to begin physical activity but and lieu of his current orthostasis was advised to abstain from heavy exertional exercise. -He was advised to monitor his calorie intake and also continue to walk in a structured environment.  5.  Heart palpitations: -Today patient reports no recurrence of palpitations since his PCI which was completed last month. -I advised him to contact us if palpitations recur and become symptomatic with his orthostasis    Cardiac Rehabilitation Eligibility Assessment  The patient is NOT ready to start cardiac rehabilitation due to: Other (Orthostatic Hypotension)   Disposition: Follow-up with Lauree Chandler, MD or APP in 4 weeks    Medication Adjustments/Labs and Tests Ordered: Current medicines are reviewed at length with the patient today.  Concerns regarding  medicines are outlined above.   Signed, Mable Fill, Marissa Nestle, NP 06/04/2022, 10:23 AM [facility]  Note:  This document was prepared using Dragon voice recognition software and may include unintentional dictation errors.

## 2022-06-04 ENCOUNTER — Other Ambulatory Visit (HOSPITAL_COMMUNITY): Payer: Self-pay

## 2022-06-04 ENCOUNTER — Ambulatory Visit: Payer: 59 | Attending: Physician Assistant | Admitting: Nurse Practitioner

## 2022-06-04 ENCOUNTER — Encounter: Payer: Self-pay | Admitting: Nurse Practitioner

## 2022-06-04 VITALS — BP 80/56 | HR 67 | Ht 74.0 in | Wt 270.0 lb

## 2022-06-04 DIAGNOSIS — E1165 Type 2 diabetes mellitus with hyperglycemia: Secondary | ICD-10-CM

## 2022-06-04 DIAGNOSIS — I2510 Atherosclerotic heart disease of native coronary artery without angina pectoris: Secondary | ICD-10-CM | POA: Diagnosis not present

## 2022-06-04 DIAGNOSIS — R002 Palpitations: Secondary | ICD-10-CM

## 2022-06-04 DIAGNOSIS — E669 Obesity, unspecified: Secondary | ICD-10-CM | POA: Diagnosis not present

## 2022-06-04 DIAGNOSIS — I959 Hypotension, unspecified: Secondary | ICD-10-CM | POA: Diagnosis not present

## 2022-06-04 DIAGNOSIS — I951 Orthostatic hypotension: Secondary | ICD-10-CM

## 2022-06-04 DIAGNOSIS — Z794 Long term (current) use of insulin: Secondary | ICD-10-CM

## 2022-06-04 MED ORDER — MIDODRINE HCL 10 MG PO TABS
10.0000 mg | ORAL_TABLET | Freq: Three times a day (TID) | ORAL | 1 refills | Status: DC
  Filled 2022-06-04: qty 49, 16d supply, fill #0
  Filled 2022-06-04: qty 41, 14d supply, fill #0
  Filled 2022-06-26 – 2022-06-27 (×2): qty 90, 30d supply, fill #1

## 2022-06-04 NOTE — Patient Instructions (Signed)
Medication Instructions:  STOP Ranexa START Midodrine '10mg'$  take 1 tablet three times a day-while taking this medication monitor your blood pressure if your blood pressure gets over 140/80 it is too high and if you get readings this high consistently then contact the office *If you need a refill on your cardiac medications before your next appointment, please call your pharmacy*   Lab Work: None ordered   Testing/Procedures: None ordered   Follow-Up: At [facility], you and your health needs are our priority.  As part of our continuing mission to provide you with exceptional heart care, we have created designated Provider Care Teams.  These Care Teams include your primary Cardiologist (physician) and Advanced Practice Providers (APPs -  Physician Assistants and Nurse Practitioners) who all work together to provide you with the care you need, when you need it.  We recommend signing up for the patient portal called "MyChart".  Sign up information is provided on this After Visit Summary.  MyChart is used to connect with patients for Virtual Visits (Telemedicine).  Patients are able to view lab/test results, encounter notes, upcoming appointments, etc.  Non-urgent messages can be sent to your provider as well.   To learn more about what you can do with MyChart, go to [email or web address].    Your next appointment:   4 month(s)  Provider:   Ambrose Pancoast, NP       Other Instructions Get some compression stockings and wear daily and remove at night Get an abdominal binder and wear as tolerated Eat a salty snack  Increase fluid before rising

## 2022-06-05 ENCOUNTER — Other Ambulatory Visit: Payer: Self-pay

## 2022-06-05 ENCOUNTER — Encounter (HOSPITAL_COMMUNITY): Payer: Self-pay

## 2022-06-05 ENCOUNTER — Other Ambulatory Visit (HOSPITAL_COMMUNITY): Payer: Self-pay

## 2022-06-06 ENCOUNTER — Other Ambulatory Visit: Payer: Self-pay | Admitting: Podiatry

## 2022-06-06 ENCOUNTER — Ambulatory Visit (INDEPENDENT_AMBULATORY_CARE_PROVIDER_SITE_OTHER): Payer: 59 | Admitting: Podiatry

## 2022-06-06 ENCOUNTER — Other Ambulatory Visit (HOSPITAL_COMMUNITY): Payer: Self-pay

## 2022-06-06 ENCOUNTER — Ambulatory Visit (INDEPENDENT_AMBULATORY_CARE_PROVIDER_SITE_OTHER): Payer: 59

## 2022-06-06 ENCOUNTER — Ambulatory Visit (INDEPENDENT_AMBULATORY_CARE_PROVIDER_SITE_OTHER): Payer: 59 | Admitting: Nurse Practitioner

## 2022-06-06 ENCOUNTER — Encounter: Payer: Self-pay | Admitting: Nurse Practitioner

## 2022-06-06 VITALS — BP 98/62 | HR 70 | Temp 97.6°F | Ht 74.0 in | Wt 267.2 lb

## 2022-06-06 DIAGNOSIS — M14672 Charcot's joint, left ankle and foot: Secondary | ICD-10-CM

## 2022-06-06 DIAGNOSIS — I951 Orthostatic hypotension: Secondary | ICD-10-CM

## 2022-06-06 DIAGNOSIS — B351 Tinea unguium: Secondary | ICD-10-CM

## 2022-06-06 DIAGNOSIS — E669 Obesity, unspecified: Secondary | ICD-10-CM | POA: Diagnosis not present

## 2022-06-06 DIAGNOSIS — N529 Male erectile dysfunction, unspecified: Secondary | ICD-10-CM

## 2022-06-06 DIAGNOSIS — I2510 Atherosclerotic heart disease of native coronary artery without angina pectoris: Secondary | ICD-10-CM | POA: Insufficient documentation

## 2022-06-06 DIAGNOSIS — Z79899 Other long term (current) drug therapy: Secondary | ICD-10-CM

## 2022-06-06 DIAGNOSIS — E559 Vitamin D deficiency, unspecified: Secondary | ICD-10-CM | POA: Diagnosis not present

## 2022-06-06 DIAGNOSIS — E1165 Type 2 diabetes mellitus with hyperglycemia: Secondary | ICD-10-CM | POA: Diagnosis not present

## 2022-06-06 DIAGNOSIS — Z794 Long term (current) use of insulin: Secondary | ICD-10-CM | POA: Diagnosis not present

## 2022-06-06 DIAGNOSIS — E785 Hyperlipidemia, unspecified: Secondary | ICD-10-CM

## 2022-06-06 MED ORDER — VITAMIN D (ERGOCALCIFEROL) 1.25 MG (50000 UNIT) PO CAPS
50000.0000 [IU] | ORAL_CAPSULE | ORAL | 0 refills | Status: DC
  Filled 2022-06-06 – 2022-06-13 (×3): qty 4, 28d supply, fill #0

## 2022-06-06 MED ORDER — NOVOLIN 70/30 FLEXPEN RELION (70-30) 100 UNIT/ML ~~LOC~~ SUPN: PEN_INJECTOR | SUBCUTANEOUS | 3 refills | Status: DC

## 2022-06-06 NOTE — Assessment & Plan Note (Signed)
Ergocalciferol refill sent to patient's pharmacy.  Patient will follow-up in approximately 1 month, consider checking serum vitamin D level at that time.

## 2022-06-06 NOTE — Progress Notes (Signed)
Established Patient Office Visit  Subjective   Patient ID: Micheal Neal, male    DOB: 01-18-1971  Age: 52 y.o. MRN: [id number]  Chief Complaint  Patient presents with   Orthostatic hypotension    Patient arrives to transfer primary care to myself.  Has been hospitalized twice within the last month for NSTEMI and diaphoresis. Has been followed closely by cardiology. Started midodrine yesterday to support BP, waiting for an abdominal binder to be delivered via mail.  Is trying to increase water intake, reports struggling with getting enough water intake daily.    Also has uncontrolled diabetes, last A1C >12, followed by endocrinology. Has financial barriers regarding ability to follow treatment plan. Has not been able to afford jardiance. Has made changes to his diet (stopped drinking sodas, does drink diet soda).  Reports that he is having about 3 low blood sugars per week that require treatment with eating a snack.  Currently on Novolin 70/30, 25 units before breakfast and 20 units before dinner.  Reports that most of his hypoglycemia occurs during the day.  He thinks this is mainly been happening because he is made changes to his diet in light of his recent NSTEMI.  Reports he is experiencing erectile dysfunction with establishing and maintaining erection, would like to have his testosterone levels checked.  This has been ongoing even prior to starting metoprolol.  Today, reports that he does not have any dizziness, sweating, or chest pain.  Has vitamin D deficiency, reports having taken 2 of the weekly ergocalciferol capsules.  Requesting refill on this.  Last serum level around 14.     Review of Systems  Constitutional:  Negative for diaphoresis.  Cardiovascular:  Negative for chest pain.  Neurological:  Negative for dizziness.      Objective:     BP 98/62   Pulse 70   Temp 97.6 F (36.4 C) (Temporal)   Ht '6\' 2"'$  (1.88 m)   Wt 267 lb 4 oz (121.2 kg)   SpO2 97%   BMI 34.31  kg/m  BP Readings from Last 3 Encounters:  06/06/22 98/62  06/04/22 (!) 80/56  05/31/22 114/70   Wt Readings from Last 3 Encounters:  06/06/22 267 lb 4 oz (121.2 kg)  06/04/22 270 lb (122.5 kg)  05/31/22 267 lb (121.1 kg)      Physical Exam Vitals reviewed.  Constitutional:      Appearance: Normal appearance.  HENT:     Head: Normocephalic and atraumatic.  Cardiovascular:     Rate and Rhythm: Normal rate and regular rhythm.  Pulmonary:     Effort: Pulmonary effort is normal.     Breath sounds: Normal breath sounds.  Musculoskeletal:     Cervical back: Neck supple.  Skin:    General: Skin is warm and dry.  Neurological:     Mental Status: He is alert and oriented to person, place, and time.  Psychiatric:        Mood and Affect: Mood normal.        Behavior: Behavior normal.        Thought Content: Thought content normal.        Judgment: Judgment normal.      No results found for any visits on 06/06/22.    The ASCVD Risk score (Arnett DK, et al., 2019) failed to calculate for the following reasons:   The patient has a prior MI or stroke diagnosis    Assessment & Plan:   Problem List Items Addressed This  Visit       Cardiovascular and Mediastinum   Orthostatic hypotension    Chronic, improved since starting midodrine yesterday.  Patient encouraged to continue taking this medication as well as to use abdominal binder and to focus on fluid intake.  Encouraged to follow-up with cardiology as scheduled.      Coronary artery disease involving native coronary artery of native heart    Chronic, patient denies any chest pain or diaphoresis.  For now he is encouraged to follow-up with cardiology and continue current treatment plan as prescribed.      Relevant Orders   AMB Referral to Pharmacy Medication Management     Endocrine   Uncontrolled type 2 diabetes mellitus with hyperglycemia, with long-term current use of insulin (HCC)    Chronic, is having more  hypoglycemia recently.  Reduce morning dose of insulin to 22 units, patient encouraged to continue focusing on his lifestyle modification in his diet.  Encourage patient to follow-up with endocrinologist.  Will also refer patient to pharmacy services to see if they can assist with his financial barriers.      Relevant Medications   insulin isophane & regular human KwikPen (NOVOLIN 70/30 KWIKPEN) (70-30) 100 UNIT/ML KwikPen   Other Relevant Orders   Ambulatory referral to Endocrinology   AMB Referral to Pharmacy Medication Management     Other   Obesity (BMI 30-39.9)    Chronic, patient encouraged to continue lifestyle modification aimed at helping him with weight loss.      Relevant Medications   insulin isophane & regular human KwikPen (NOVOLIN 70/30 KWIKPEN) (70-30) 100 UNIT/ML KwikPen   Hyperlipidemia with target LDL less than 100 - Primary    Chronic, last LDL greater than 100.  Patient to continue on atorvastatin 80 mg daily, consider checking fasting lipid panel at next office visit.      Relevant Orders   Comprehensive metabolic panel   CBC   AMB Referral to Pharmacy Medication Management   Vitamin D deficiency   Relevant Medications   Vitamin D, Ergocalciferol, (DRISDOL) 1.25 MG (50000 UNIT) CAPS capsule   Erectile dysfunction    Chronic, patient will return tomorrow early in the morning to have early testosterone levels drawn.  Further recommendations may be made based upon his results.  If low may consider retesting in 1 month.      Relevant Orders   Testosterone Total,Free,Bio, Males    Return in about 1 month (around 07/07/2022) for F/u with Donivan Thammavong during the 8 oclock hour.  Total time spent on encounter today was 55 minutes including face-to-face interaction with the patient, review of patient's previous medical records, and developing/discussing treatment plan.   Ailene Ards, NP

## 2022-06-06 NOTE — Progress Notes (Signed)
Subjective:  Patient ID: Micheal Neal, male    DOB: January 26, 1971,  MRN: [id number]  Chief Complaint  Patient presents with   Foot Pain    Left foot pain for about a year     52 y.o. male presents with the above complaint.  Patient presents for follow-up of left Charcot foot deformity.  He states he gets occasional pain especially when going barefooted.  He is here to get it evaluated to make sure everything seems fine he denies any other acute complaints he also has secondary complaint of left 1 through 5 onychomycosis nail fungus is tried over-the-counter medication none of them.  He would like to discuss oral medication  Review of Systems: Negative except as noted in the HPI. Denies N/V/F/Ch.  Past Medical History:  Diagnosis Date   Abnormal EKG    Contusion of left foot    Diabetes mellitus without complication (HCC)    Dizziness    GERD (gastroesophageal reflux disease)    Near syncope    Palpitations    Vertigo     Current Outpatient Medications:    aspirin EC 81 MG tablet, Take 1 tablet (81 mg total) by mouth daily. Swallow whole., Disp: 90 tablet, Rfl: 1   atorvastatin (LIPITOR) 80 MG tablet, Take 1 tablet (80 mg total) by mouth daily., Disp: 90 tablet, Rfl: 3   Continuous Blood Gluc Sensor (DEXCOM G7 SENSOR) MISC, Use as directed. Change every 10 days., Disp: 9 each, Rfl: 3   CYANOCOBALAMIN IJ, Inject 1 Dose as directed every 30 (thirty) days., Disp: , Rfl:    fluorometholone (FML) 0.1 % ophthalmic suspension, Instill 1 drop into right eye four times a day, Disp: 10 mL, Rfl: 0   glipiZIDE (GLUCOTROL XL) 10 MG 24 hr tablet, Take 1 tablet (10 mg total) by mouth daily with breakfast., Disp: 90 tablet, Rfl: 3   insulin isophane & regular human KwikPen (NOVOLIN 70/30 KWIKPEN) (70-30) 100 UNIT/ML KwikPen, Inject 22 Units into the skin daily before breakfast AND 20 Units daily before supper. After breakfast and dinner., Disp: 45 mL, Rfl: 3   Insulin Pen Needle (TRUEPLUS PEN NEEDLES)  31G X 5 MM MISC, USE AS DIRECTED FOUR TIMES DAILY, Disp: 200 each, Rfl: 3   metoprolol succinate (TOPROL-XL) 25 MG 24 hr tablet, Take 1/2 tablet (12.5 mg total) by mouth daily., Disp: 45 tablet, Rfl: 1   midodrine (PROAMATINE) 10 MG tablet, Take 1 tablet (10 mg total) by mouth 3 (three) times daily., Disp: 90 tablet, Rfl: 1   omega-3 acid ethyl esters (LOVAZA) 1 g capsule, Take 2 capsules (2 g total) by mouth 2 (two) times daily., Disp: 360 capsule, Rfl: 1   ticagrelor (BRILINTA) 90 MG TABS tablet, Take 1 tablet (90 mg total) by mouth 2 (two) times daily., Disp: 60 tablet, Rfl: 0   Vitamin D, Ergocalciferol, (DRISDOL) 1.25 MG (50000 UNIT) CAPS capsule, Take 1 capsule (50,000 Units total) by mouth every 7 (seven) days., Disp: 4 capsule, Rfl: 0  Social History   Tobacco Use  Smoking Status Never  Smokeless Tobacco Never    No Known Allergies Objective:   There were no vitals filed for this visit. There is no height or weight on file to calculate BMI. Constitutional Well developed. Well nourished.  Vascular Dorsalis pedis pulses palpable bilaterally. Posterior tibial pulses palpable bilaterally. Capillary refill normal to all digits.  No cyanosis or clubbing noted. Pedal hair growth normal.  Neurologic Normal speech. Oriented to person, place, and time. Epicritic  sensation to light touch grossly present bilaterally.  Dermatologic Left hallux second third fourth fifth digit onychomycosis with thickened elongated dystrophic mycotic nails No open wounds or Charcot deformation noted yet. No skin lesions.  Orthopedic:  Mild pain on palpation to the first tarsometatarsal joint.  No pain with range of motion.  Mild pain extends distally to the submetatarsal one.  No pain along the posterior tibial tendon including its insertion.  No pain with eversion or inversion of the foot.   Radiographs: 3 views of skeletally mature adult left foot.  Compared to prior x-rays no interval changes noted.   The first tarsometatarsal joint as well as the midfoot appear to be in stable position. Assessment:   1. Charcot's joint of foot, left   2. Nail fungus   3. Onychomycosis due to dermatophyte   4. Long-term use of high-risk medication    Plan:  Patient was evaluated and treated and all questions answered.  Left plantar midfoot pain in the medial arch/soft tissue contusion secondary Charcot~completely consolidated -Clinically very minimal change noted from previous radiographic x-rays taken 2 years ago.  He was lost to follow-up due to insurance changes.  At this time I reviewed the radiographs finding appears to be very stabilized Charcot.  I continue to encourage him to wear proper shoe gear modification.  He states understanding  Left 1 through 5 onychomycosis -Educated the patient on the etiology of onychomycosis and various treatment options associated with improving the fungal load.  I explained to the patient that there is 3 treatment options available to treat the onychomycosis including topical, p.o., laser treatment.  Patient elected to undergo p.o. options with Lamisil/terbinafine therapy.  In order for me to start the medication therapy, I explained to the patient the importance of evaluating the liver and obtaining the liver function test.  Once the liver function test comes back normal I will start him on 75-monthcourse of Lamisil therapy.  Patient understood all risk and would like to proceed with Lamisil therapy.  I have asked the patient to immediately stop the Lamisil therapy if she has any reactions to it and call the office or go to the emergency room right away.  Patient states understanding    No follow-ups on file.

## 2022-06-06 NOTE — Assessment & Plan Note (Signed)
Chronic, patient denies any chest pain or diaphoresis.  For now he is encouraged to follow-up with cardiology and continue current treatment plan as prescribed.

## 2022-06-06 NOTE — Assessment & Plan Note (Signed)
Chronic, improved since starting midodrine yesterday.  Patient encouraged to continue taking this medication as well as to use abdominal binder and to focus on fluid intake.  Encouraged to follow-up with cardiology as scheduled.

## 2022-06-06 NOTE — Assessment & Plan Note (Signed)
Chronic, is having more hypoglycemia recently.  Reduce morning dose of insulin to 22 units, patient encouraged to continue focusing on his lifestyle modification in his diet.  Encourage patient to follow-up with endocrinologist.  Will also refer patient to pharmacy services to see if they can assist with his financial barriers.

## 2022-06-06 NOTE — Assessment & Plan Note (Signed)
Chronic, patient encouraged to continue lifestyle modification aimed at helping him with weight loss.

## 2022-06-06 NOTE — Assessment & Plan Note (Signed)
Chronic, last LDL greater than 100.  Patient to continue on atorvastatin 80 mg daily, consider checking fasting lipid panel at next office visit.

## 2022-06-06 NOTE — Patient Instructions (Addendum)
Wilder Glade - SGLT2 may be going generic can be more cost effective as your endocrinologist about this.   [facility] - [phone] call to discuss sleep apnea testing.

## 2022-06-06 NOTE — Assessment & Plan Note (Addendum)
Chronic, patient will return tomorrow early in the morning to have early testosterone levels drawn.  Further recommendations may be made based upon his results.  If low may consider retesting in 1 month.

## 2022-06-07 ENCOUNTER — Other Ambulatory Visit (HOSPITAL_COMMUNITY): Payer: Self-pay

## 2022-06-07 ENCOUNTER — Other Ambulatory Visit (INDEPENDENT_AMBULATORY_CARE_PROVIDER_SITE_OTHER): Payer: 59

## 2022-06-07 ENCOUNTER — Other Ambulatory Visit: Payer: Self-pay | Admitting: Nurse Practitioner

## 2022-06-07 ENCOUNTER — Telehealth: Payer: Self-pay

## 2022-06-07 DIAGNOSIS — E785 Hyperlipidemia, unspecified: Secondary | ICD-10-CM | POA: Diagnosis not present

## 2022-06-07 DIAGNOSIS — N529 Male erectile dysfunction, unspecified: Secondary | ICD-10-CM

## 2022-06-07 LAB — COMPREHENSIVE METABOLIC PANEL
ALT: 21 U/L (ref 0–53)
AST: 25 U/L (ref 0–37)
Albumin: 3.9 g/dL (ref 3.5–5.2)
Alkaline Phosphatase: 73 U/L (ref 39–117)
BUN: 24 mg/dL — ABNORMAL HIGH (ref 6–23)
CO2: 29 mEq/L (ref 19–32)
Calcium: 9.2 mg/dL (ref 8.4–10.5)
Chloride: 103 mEq/L (ref 96–112)
Creatinine, Ser: 1.39 mg/dL (ref 0.40–1.50)
GFR: 59.38 mL/min — ABNORMAL LOW (ref >60.00)
Glucose, Bld: 87 mg/dL (ref 70–99)
Potassium: 4.1 mEq/L (ref 3.5–5.1)
Sodium: 140 mEq/L (ref 135–145)
Total Bilirubin: 0.7 mg/dL (ref 0.2–1.2)
Total Protein: 7.4 g/dL (ref 6.0–8.3)

## 2022-06-07 LAB — HEPATIC FUNCTION PANEL
ALT: 24 U/L (ref 0–53)
AST: 26 U/L (ref 0–37)
Albumin: 3.9 g/dL (ref 3.5–5.2)
Alkaline Phosphatase: 75 U/L (ref 39–117)
Bilirubin, Direct: 0.1 mg/dL (ref 0.0–0.3)
Total Bilirubin: 0.7 mg/dL (ref 0.2–1.2)
Total Protein: 7.5 g/dL (ref 6.0–8.3)

## 2022-06-07 LAB — CBC
HCT: 38.4 % — ABNORMAL LOW (ref 39.0–52.0)
Hemoglobin: 12.9 g/dL — ABNORMAL LOW (ref 13.0–17.0)
MCHC: 33.7 g/dL (ref 30.0–36.0)
MCV: 84.4 fl (ref 78.0–100.0)
Platelets: 246 10*3/uL (ref 150.0–400.0)
RBC: 4.55 Mil/uL (ref 4.22–5.81)
RDW: 13.2 % (ref 11.5–15.5)
WBC: 6.7 10*3/uL (ref 4.0–10.5)

## 2022-06-07 MED ORDER — TERBINAFINE HCL 250 MG PO TABS
250.0000 mg | ORAL_TABLET | Freq: Every day | ORAL | 0 refills | Status: DC
  Filled 2022-06-07: qty 30, 30d supply, fill #0
  Filled 2022-07-02: qty 30, 30d supply, fill #1

## 2022-06-07 NOTE — Progress Notes (Signed)
   Care Guide Note  06/07/2022 Name: Garnet Overfield MRN: [id number] DOB: 04-02-1970  Referred by: Ailene Ards, NP Reason for referral : No chief complaint on file.   Jessica Seidman is a 52 y.o. year old male who is a primary care patient of Ailene Ards, NP. Aryaan Persichetti was referred to the pharmacist for assistance related to DM.    An unsuccessful telephone outreach was attempted today to contact the patient who was referred to the pharmacy team for assistance with medication assistance. Additional attempts will be made to contact the patient.   Noreene Larsson, [place], [place] [address] Direct Dial: [phone] [email or web address]

## 2022-06-08 LAB — TESTOSTERONE TOTAL,FREE,BIO, MALES
Albumin: 4.1 g/dL (ref 3.6–5.1)
Sex Hormone Binding: 28 nmol/L (ref 10–50)
Testosterone, Bioavailable: 70.2 ng/dL — ABNORMAL LOW (ref 110.0–575.0)
Testosterone, Free: 37.3 pg/mL — ABNORMAL LOW (ref 46.0–224.0)
Testosterone: 255 ng/dL (ref 250–827)

## 2022-06-11 DIAGNOSIS — E1165 Type 2 diabetes mellitus with hyperglycemia: Secondary | ICD-10-CM | POA: Diagnosis not present

## 2022-06-12 ENCOUNTER — Other Ambulatory Visit (HOSPITAL_COMMUNITY): Payer: Self-pay

## 2022-06-13 ENCOUNTER — Encounter: Payer: Self-pay | Admitting: Nurse Practitioner

## 2022-06-13 ENCOUNTER — Other Ambulatory Visit (HOSPITAL_COMMUNITY): Payer: Self-pay

## 2022-06-13 ENCOUNTER — Telehealth: Payer: Self-pay | Admitting: Pharmacy Technician

## 2022-06-13 ENCOUNTER — Other Ambulatory Visit: Payer: Self-pay

## 2022-06-13 DIAGNOSIS — N529 Male erectile dysfunction, unspecified: Secondary | ICD-10-CM

## 2022-06-13 DIAGNOSIS — R7989 Other specified abnormal findings of blood chemistry: Secondary | ICD-10-CM

## 2022-06-13 NOTE — Telephone Encounter (Signed)
Pharmacy Patient Advocate Encounter   Received notification from Pt advice requests/CMA that prior authorization for Jardiance '10mg'$  is required/requested.  Per Test Claim: step therapy    PA submitted on 06/13/22 to (ins) [employer] via CoverMyMeds Key or (Medicaid) confirmation # [id number] Status is pending

## 2022-06-13 NOTE — Telephone Encounter (Signed)
PA request sent.

## 2022-06-13 NOTE — Progress Notes (Signed)
   Care Guide Note  06/13/2022 Name: Micheal Neal MRN: [id number] DOB: 07-07-1970  Referred by: Ailene Ards, NP Reason for referral : Care Coordination (Outreach to schedule with pharm d)   Micheal Neal is a 52 y.o. year old male who is a primary care patient of Ailene Ards, NP. Micheal Neal was referred to the pharmacist for assistance related to DM.    A second unsuccessful telephone outreach was attempted today to contact the patient who was referred to the pharmacy team for assistance with medication assistance. Additional attempts will be made to contact the patient.  Noreene Larsson, [place], [place] [address] Direct Dial: [phone] [email or web address]

## 2022-06-13 NOTE — Telephone Encounter (Signed)
Looks like the prescription was d/c last week. Will you double check that Dr. Kelton Pillar still wants him on it? I'll go ahead and work on a test claim to see what I find there.

## 2022-06-13 NOTE — Telephone Encounter (Signed)
Error

## 2022-06-14 ENCOUNTER — Other Ambulatory Visit (HOSPITAL_COMMUNITY): Payer: Self-pay

## 2022-06-14 ENCOUNTER — Other Ambulatory Visit: Payer: Self-pay | Admitting: Nurse Practitioner

## 2022-06-14 ENCOUNTER — Ambulatory Visit: Payer: 59 | Admitting: Physician Assistant

## 2022-06-14 DIAGNOSIS — N529 Male erectile dysfunction, unspecified: Secondary | ICD-10-CM

## 2022-06-14 DIAGNOSIS — E1165 Type 2 diabetes mellitus with hyperglycemia: Secondary | ICD-10-CM

## 2022-06-14 MED ORDER — NOVOLIN 70/30 FLEXPEN RELION (70-30) 100 UNIT/ML ~~LOC~~ SUPN
PEN_INJECTOR | SUBCUTANEOUS | 0 refills | Status: DC
  Filled 2022-06-14: qty 12, 29d supply, fill #0

## 2022-06-15 ENCOUNTER — Other Ambulatory Visit (HOSPITAL_COMMUNITY): Payer: Self-pay

## 2022-06-17 ENCOUNTER — Other Ambulatory Visit (HOSPITAL_COMMUNITY): Payer: Self-pay

## 2022-06-17 ENCOUNTER — Other Ambulatory Visit: Payer: Self-pay

## 2022-06-17 LAB — HM DIABETES EYE EXAM

## 2022-06-17 NOTE — Telephone Encounter (Signed)
Pharmacy Patient Advocate Encounter  Prior Authorization for Jardiance 10mg  has been approved by [employer] (ins).    PA # PA Case ID #: [id number] Effective dates: 06/13/22 through 06/13/23   A new prescription will have to be put in since the original one was d/c.

## 2022-06-17 NOTE — Progress Notes (Signed)
   Care Guide Note  06/17/2022 Name: Micheal Neal MRN: [id number] DOB: April 19, 1970  Referred by: Ailene Ards, NP Reason for referral : Care Coordination (Outreach to schedule with pharm d)   Micheal Neal is a 52 y.o. year old male who is a primary care patient of Ailene Ards, NP. Micheal Neal was referred to the pharmacist for assistance related to DM.    Successful contact was made with the patient to discuss pharmacy services including being ready for the pharmacist to call at least 5 minutes before the scheduled appointment time, to have medication bottles and any blood sugar or blood pressure readings ready for review. The patient agreed to meet with the pharmacist via with the pharmacist via telephone visit on (date/time).  06/21/2022  Noreene Larsson, [place], [place] [address] Direct Dial: [phone] [email or web address]

## 2022-06-19 ENCOUNTER — Telehealth: Payer: Self-pay | Admitting: Neurology

## 2022-06-19 ENCOUNTER — Institutional Professional Consult (permissible substitution): Payer: 59 | Admitting: Neurology

## 2022-06-20 ENCOUNTER — Ambulatory Visit (INDEPENDENT_AMBULATORY_CARE_PROVIDER_SITE_OTHER): Payer: 59

## 2022-06-20 ENCOUNTER — Other Ambulatory Visit: Payer: Self-pay | Admitting: Nurse Practitioner

## 2022-06-20 ENCOUNTER — Other Ambulatory Visit (HOSPITAL_COMMUNITY): Payer: Self-pay

## 2022-06-20 DIAGNOSIS — E538 Deficiency of other specified B group vitamins: Secondary | ICD-10-CM | POA: Diagnosis not present

## 2022-06-20 DIAGNOSIS — I2510 Atherosclerotic heart disease of native coronary artery without angina pectoris: Secondary | ICD-10-CM

## 2022-06-20 MED ORDER — CYANOCOBALAMIN 1000 MCG/ML IJ SOLN
1000.0000 ug | Freq: Once | INTRAMUSCULAR | Status: AC
  Administered 2022-06-20: 1000 ug via INTRAMUSCULAR

## 2022-06-20 MED ORDER — TICAGRELOR 90 MG PO TABS
90.0000 mg | ORAL_TABLET | Freq: Two times a day (BID) | ORAL | 1 refills | Status: DC
  Filled 2022-06-20: qty 60, 30d supply, fill #0
  Filled 2022-07-16 – 2022-07-18 (×2): qty 60, 30d supply, fill #1

## 2022-06-20 NOTE — Progress Notes (Signed)
After obtaining consent, and per orders of  Jeralyn Ruths NP, injection of B12 given by Marrian Salvage. Patient instructed to report any adverse reaction to me immediately.

## 2022-06-21 ENCOUNTER — Other Ambulatory Visit: Payer: 59

## 2022-06-21 ENCOUNTER — Other Ambulatory Visit: Payer: Self-pay

## 2022-06-21 NOTE — Progress Notes (Signed)
06/21/2022 Name: Micheal Neal MRN: [id number] DOB: 05/08/1970  Chief Complaint  Patient presents with   Medication Management   Micheal Neal is a 52 y.o. year old male who presented for a telephone visit.   They were referred to the pharmacist by their PCP for assistance in managing medication access.   Patient is participating in a Managed Medicaid Plan:  No Subjective: Referral to pharmacy to assist patient with affordability of Jardiance prescription Care Team: Primary Care Provider: Ailene Ards, NP ; Next Scheduled Visit: 07/11/22 Endocrinologist Shamleffer; Next Scheduled Visit: 08/30/22  Medication Access/Adherence Patient reports affordability concerns with their medications: No  Patient reports access/transportation concerns to their pharmacy: No  Patient reports adherence concerns with their medications:  No    Diabetes: -Current medications: glipizide 10mg  Xl daily with breakfast, novolin 70/30 22 units before breakfast, and 20 units before supper, Jardiance 10mg  daily -Medications tried in the past: Metformin (caused GI upset), Ozempic/Trulicity (not covered by insurance) -Current glucose readings: FBG 140, patient states highest post-prandial is 180 as of late -Using Dexcom CGM -Patient reports hypoglycemic s/sx including dizziness, shakiness, sweating. Approximately 3 occurrences in the past month; Dexcom alerts and he knows to have a snack. -Patient denies hyperglycemic symptoms including polyuria, polydipsia, polyphagia, nocturia, neuropathy, blurred vision. -Current medication access support: Jardiance prior authorization was approved 3/19 and patient has medication  Hyperlipidemia/ASCVD Risk Reduction -Current lipid lowering medications: Atorvastatin 80mg  daily, Lovaza 2g BID -Antiplatelet regimen: ASA 81mg  daily, Brilinta 90mg  BID -ASCVD History: NSTEMI, CAD -Family History: Heart disease, mother  Medication Management: Current adherence strategy: wife sets  up weekly pill organizer -Patient reports Good adherence to medications -Patient reports the following barriers to adherence: none at this time  Objective: Lab Results  Component Value Date   HGBA1C 12.5 (H) 05/19/2022   Lab Results  Component Value Date   CREATININE 1.39 06/07/2022   BUN 24 (H) 06/07/2022   NA 140 06/07/2022   K 4.1 06/07/2022   CL 103 06/07/2022   CO2 29 06/07/2022   Lab Results  Component Value Date   CHOL 241 (H) 05/21/2022   HDL 31 (L) 05/21/2022   LDLCALC UNABLE TO CALCULATE IF TRIGLYCERIDE OVER 400 mg/dL 05/21/2022   LDLDIRECT 121 (H) 05/21/2022   TRIG 455 (H) 05/21/2022   CHOLHDL 7.8 05/21/2022   Medications Reviewed Today     Reviewed by Darlina Guys, [facility] (Pharmacist) on 06/21/22 at 0905  Med List Status: <None>   Medication Order Taking? Sig Documenting Provider Last Dose Status Informant  aspirin EC 81 MG tablet [id number] Yes Take 1 tablet (81 mg total) by mouth daily. Swallow whole. [name], MD Taking Active Pharmacy Records, Self  atorvastatin (LIPITOR) 80 MG tablet [id number] Yes Take 1 tablet (80 mg total) by mouth daily. Mercy Riding, MD Taking Active Self, Pharmacy Records  Continuous Blood Gluc Sensor ([place]) [place] [id number] Yes Use as directed. Change every 10 days. Shamleffer, Melanie Crazier, MD Taking Active   Bernita Raisin [id number] Yes Inject 1 Dose as directed every 30 (thirty) days. [provider] Taking Active Pharmacy Records, Self           Med Note Patrick North, REBECCA H   Wed May 29, 2022 11:50 AM) Dewaine Conger monthly    fluorometholone (FML) 0.1 % ophthalmic suspension [id number] Yes Instill 1 drop into right eye four times a day  Taking Active   glipiZIDE (GLUCOTROL XL) 10 MG 24 hr tablet [id number] Yes Take 1 tablet (  10 mg total) by mouth daily with breakfast. Shamleffer, Melanie Crazier, MD Taking Active   insulin isophane & regular human KwikPen (NOVOLIN 70/30 KWIKPEN) (70-30) 100 UNIT/ML KwikPen  [id number] Yes Inject 22 Units into the skin daily before breakfast AND 20 Units daily before supper. Ailene Ards, NP Taking Active   Insulin Pen Needle (TRUEPLUS PEN NEEDLES) 31G X 5 MM MISC [id number] Yes USE AS DIRECTED FOUR TIMES DAILY Shamleffer, Melanie Crazier, MD Taking Active   metoprolol succinate (TOPROL-XL) 25 MG 24 hr tablet [id number] Yes Take 1/2 tablet (12.5 mg total) by mouth daily. Mercy Riding, MD Taking Active Self, Pharmacy Records  midodrine (PROAMATINE) 10 MG tablet [id number] Yes Take 1 tablet (10 mg total) by mouth 3 (three) times daily. [name], NP Taking Active   omega-3 acid ethyl esters (LOVAZA) 1 g capsule [id number] Yes Take 2 capsules (2 g total) by mouth 2 (two) times daily. [name], MD Taking Active Pharmacy Records, Self           Med Note Heriberto Antigua May 20, 2022 10:16 AM) Dispense records do not show compliance.   terbinafine (LAMISIL) 250 MG tablet [id number] Yes Take 1 tablet (250 mg total) by mouth daily. Felipa Furnace, DPM Taking Active   ticagrelor (BRILINTA) 90 MG TABS tablet [id number] Yes Take 1 tablet (90 mg total) by mouth 2 (two) times daily. Ailene Ards, NP Taking Active   Vitamin D, Ergocalciferol, (DRISDOL) 1.25 MG (50000 UNIT) CAPS capsule [id number] Yes Take 1 capsule (50,000 Units total) by mouth every 7 (seven) days. Ailene Ards, NP Taking Active            Assessment/Plan:   Diabetes: - Currently uncontrolled - Recommend to continue current regimen and regular follow-up with Endo and PCP -Recheck A1c in 3 months to evaluate efficacy of Jardiance 10mg .  If patient not at goal:  could increase Jardiance to 25mg  daily.  Could also retrial Metformin using XR formulation for better GI tolerance, or GLP1 could be considered as he now has insurance coverage (would recommend holding glipizide if adding GLP1)  - Recommend to continue to check glucose continuously with Dexcom and contact provider(s) if consistently  <70 or >180  Hyperlipidemia/ASCVD Risk Reduction: - Currently uncontrolled.  - Recommend to keep follow up with Cardiology scheduled for 5/23 -Could consider addition of ezetimibe or PCSK9 inhibitor in an attempt to reach LDL goal of <55   Medication Management: - Currently strategy sufficient to maintain appropriate adherence to prescribed medication regimen  Follow Up Plan: None needed from pharmacy at this time  Darlina Guys, PharmD, DPLA

## 2022-06-24 ENCOUNTER — Other Ambulatory Visit (HOSPITAL_COMMUNITY): Payer: Self-pay

## 2022-06-25 ENCOUNTER — Other Ambulatory Visit (HOSPITAL_COMMUNITY): Payer: Self-pay

## 2022-06-26 ENCOUNTER — Other Ambulatory Visit (HOSPITAL_COMMUNITY): Payer: Self-pay

## 2022-06-26 ENCOUNTER — Other Ambulatory Visit: Payer: Self-pay

## 2022-06-26 ENCOUNTER — Other Ambulatory Visit: Payer: Self-pay | Admitting: Nurse Practitioner

## 2022-06-26 DIAGNOSIS — E559 Vitamin D deficiency, unspecified: Secondary | ICD-10-CM

## 2022-06-26 MED ORDER — EMPAGLIFLOZIN 10 MG PO TABS
10.0000 mg | ORAL_TABLET | Freq: Every day | ORAL | 5 refills | Status: DC
  Filled 2022-06-26: qty 30, 30d supply, fill #0
  Filled 2022-07-23: qty 30, 30d supply, fill #1
  Filled 2022-08-20: qty 30, 30d supply, fill #2

## 2022-06-27 ENCOUNTER — Other Ambulatory Visit: Payer: Self-pay

## 2022-06-27 ENCOUNTER — Other Ambulatory Visit (HOSPITAL_COMMUNITY): Payer: Self-pay

## 2022-06-28 ENCOUNTER — Other Ambulatory Visit (HOSPITAL_COMMUNITY): Payer: Self-pay

## 2022-07-01 ENCOUNTER — Other Ambulatory Visit: Payer: Self-pay | Admitting: Nurse Practitioner

## 2022-07-01 ENCOUNTER — Other Ambulatory Visit: Payer: Self-pay

## 2022-07-01 ENCOUNTER — Other Ambulatory Visit (HOSPITAL_COMMUNITY): Payer: Self-pay

## 2022-07-01 DIAGNOSIS — E559 Vitamin D deficiency, unspecified: Secondary | ICD-10-CM

## 2022-07-01 MED ORDER — MIDODRINE HCL 10 MG PO TABS
10.0000 mg | ORAL_TABLET | Freq: Three times a day (TID) | ORAL | 1 refills | Status: DC
  Filled 2022-07-01 – 2022-07-29 (×2): qty 90, 30d supply, fill #0
  Filled 2022-08-28: qty 90, 30d supply, fill #1

## 2022-07-01 NOTE — Telephone Encounter (Signed)
Would you like to refill this or wait until 4/11 appt to check labs?

## 2022-07-02 NOTE — Telephone Encounter (Signed)
Will check serum levels when he follows-up next week

## 2022-07-03 ENCOUNTER — Other Ambulatory Visit (HOSPITAL_COMMUNITY): Payer: Self-pay

## 2022-07-08 ENCOUNTER — Other Ambulatory Visit: Payer: Self-pay

## 2022-07-10 ENCOUNTER — Other Ambulatory Visit (HOSPITAL_COMMUNITY): Payer: Self-pay

## 2022-07-11 ENCOUNTER — Ambulatory Visit (INDEPENDENT_AMBULATORY_CARE_PROVIDER_SITE_OTHER): Payer: 59 | Admitting: Nurse Practitioner

## 2022-07-11 ENCOUNTER — Encounter: Payer: Self-pay | Admitting: Nurse Practitioner

## 2022-07-11 ENCOUNTER — Other Ambulatory Visit: Payer: Self-pay

## 2022-07-11 ENCOUNTER — Other Ambulatory Visit (HOSPITAL_COMMUNITY): Payer: Self-pay

## 2022-07-11 VITALS — BP 100/60 | HR 72 | Temp 97.7°F | Ht 74.0 in | Wt 265.2 lb

## 2022-07-11 DIAGNOSIS — E559 Vitamin D deficiency, unspecified: Secondary | ICD-10-CM

## 2022-07-11 DIAGNOSIS — E1165 Type 2 diabetes mellitus with hyperglycemia: Secondary | ICD-10-CM

## 2022-07-11 DIAGNOSIS — I951 Orthostatic hypotension: Secondary | ICD-10-CM

## 2022-07-11 DIAGNOSIS — Z1159 Encounter for screening for other viral diseases: Secondary | ICD-10-CM

## 2022-07-11 DIAGNOSIS — N644 Mastodynia: Secondary | ICD-10-CM | POA: Diagnosis not present

## 2022-07-11 DIAGNOSIS — Z794 Long term (current) use of insulin: Secondary | ICD-10-CM

## 2022-07-11 DIAGNOSIS — E538 Deficiency of other specified B group vitamins: Secondary | ICD-10-CM

## 2022-07-11 DIAGNOSIS — I2510 Atherosclerotic heart disease of native coronary artery without angina pectoris: Secondary | ICD-10-CM

## 2022-07-11 DIAGNOSIS — D649 Anemia, unspecified: Secondary | ICD-10-CM | POA: Diagnosis not present

## 2022-07-11 DIAGNOSIS — D500 Iron deficiency anemia secondary to blood loss (chronic): Secondary | ICD-10-CM | POA: Insufficient documentation

## 2022-07-11 HISTORY — DX: Encounter for screening for other viral diseases: Z11.59

## 2022-07-11 HISTORY — DX: Anemia, unspecified: D64.9

## 2022-07-11 LAB — BASIC METABOLIC PANEL
BUN: 32 mg/dL — ABNORMAL HIGH (ref 6–23)
CO2: 27 mEq/L (ref 19–32)
Calcium: 9.2 mg/dL (ref 8.4–10.5)
Chloride: 102 mEq/L (ref 96–112)
Creatinine, Ser: 1.42 mg/dL (ref 0.40–1.50)
GFR: 57.84 mL/min — ABNORMAL LOW (ref >60.00)
Glucose, Bld: 118 mg/dL — ABNORMAL HIGH (ref 70–99)
Potassium: 4.4 mEq/L (ref 3.5–5.1)
Sodium: 137 mEq/L (ref 135–145)

## 2022-07-11 LAB — CBC
HCT: 32.3 % — ABNORMAL LOW (ref 39.0–52.0)
Hemoglobin: 10.7 g/dL — ABNORMAL LOW (ref 13.0–17.0)
MCHC: 33.2 g/dL (ref 30.0–36.0)
MCV: 84.4 fl (ref 78.0–100.0)
Platelets: 305 10*3/uL (ref 150.0–400.0)
RBC: 3.82 Mil/uL — ABNORMAL LOW (ref 4.22–5.81)
RDW: 13.3 % (ref 11.5–15.5)
WBC: 7.1 10*3/uL (ref 4.0–10.5)

## 2022-07-11 LAB — VITAMIN D 25 HYDROXY (VIT D DEFICIENCY, FRACTURES): VITD: 32.91 ng/mL (ref 30.00–100.00)

## 2022-07-11 LAB — FERRITIN: Ferritin: 53.9 ng/mL (ref 22.0–322.0)

## 2022-07-11 LAB — VITAMIN B12: Vitamin B-12: 338 pg/mL (ref 211–911)

## 2022-07-11 LAB — IRON: Iron: 69 ug/dL (ref 42–165)

## 2022-07-11 LAB — TROPONIN I (HIGH SENSITIVITY): High Sens Troponin I: 6 ng/L (ref 2–17)

## 2022-07-11 MED ORDER — CARVEDILOL 3.125 MG PO TABS
3.1250 mg | ORAL_TABLET | Freq: Two times a day (BID) | ORAL | 3 refills | Status: DC
  Filled 2022-07-11: qty 60, 30d supply, fill #0
  Filled 2022-08-06: qty 60, 30d supply, fill #1
  Filled 2022-09-02: qty 60, 30d supply, fill #2
  Filled 2022-10-06: qty 60, 30d supply, fill #3

## 2022-07-11 MED ORDER — NOVOLIN 70/30 FLEXPEN RELION (70-30) 100 UNIT/ML ~~LOC~~ SUPN: PEN_INJECTOR | SUBCUTANEOUS | 0 refills | Status: DC

## 2022-07-11 NOTE — Assessment & Plan Note (Signed)
Stop metoprolol, start carvedilol 3.125 mg twice daily.  Continue midodrine 3 times a day.  Continue to focus on hydration, use of compression stockings, and use of abdominal binder.  Follow-up in 2 to 4 weeks.

## 2022-07-11 NOTE — Assessment & Plan Note (Addendum)
Chronic, no chest pain today. EKG without ST-segment changes, does continue to have orthostatic hypotension.  Check stat to troponin, if elevated will call patient and recommend ER evaluation.  Continue DAPT, atorvastatin, nitroglycerin as needed. Stop metoprolol and change to carvedilol 3.125mg  BID to see if this helps with his orthostatic hypotension/dizziness. Patient encouraged to follow-up with cardiology, continue to focus on proper hydration during the day, continue compression stocking use, continue use of abdominal binder.  Follow-up in 2 to 4 weeks to check blood pressure.

## 2022-07-11 NOTE — Patient Instructions (Addendum)
STOP metoprolol and START the carvedilol

## 2022-07-11 NOTE — Assessment & Plan Note (Signed)
Check serum vitamin B12 and CBC today.  Further recommendations may be made based on the results.  Schedule patient for subsequent IM B12 injection later this month.

## 2022-07-11 NOTE — Progress Notes (Signed)
Established Patient Office Visit  Subjective   Patient ID: Micheal Neal, male    DOB: Sep 13, 1970  Age: 52 y.o. MRN: [id number]  Chief Complaint  Patient presents with   Medical Management of Chronic Issues    1 month follow up, dizziness after waking up in the mornings or mid days  Worse than before, happens often now    Here for f/u.  Dizziness: continues to be ongoing, occurring daily. Has known orthostatic hypotension. Had NSTEMI 05/2018 with DES placement. Now on DAPT (brilinta and aspirin) also on B-Blocker (metoprolol). Compliant with atorvastatin, last LDL 05/2022 121. Due to hypotension started on midodrine which he reports being compliant on. Follows with cardiology. Last OV 05/2022. He was recommended to take midodrine as directed, focus on appropriate hydration, use compression stockings and abdominal binder. He reports today he is using compression stockings routinely (not wearing today in office) and uses the abdominal binder sporadically. Denies chest pain today, no new shortness of breath. Denies headache, double vision, vomiting, new weakness, gait disturbances. Does endorse "seeing stars" in vision with position changes, but this will subside in moments, does have some tingling sensation to bilateral arms when sleeping at night, this resolves with position changes, some nausea at times that will resolve as well. Also has T2DM, seeing endocrinology had some lows recently but reduced 70/30 insulin to 22units QAM and 15 units QPM. Also on glipizide and jardiance, using dexcom CGM.  Nipple tenderness: Noted over the last 2 years, occurs when cold, resolves once he warms up. Has been happening more frequently. Worse on right side. Denies mass or nipple discharge.   Anemia: last hgb 12.9. Has history of vitamin B12 and D deficiency. Due for next B12 IM injection later this month. Last B12 level 142 (04/2022).     Review of Systems  Constitutional:  Positive for malaise/fatigue (helping  to watch an infant). Negative for diaphoresis.  Eyes:  Positive for blurred vision. Negative for double vision.  Cardiovascular:  Negative for chest pain.  Gastrointestinal:  Positive for nausea. Negative for heartburn and vomiting.  Neurological:  Positive for dizziness. Negative for tingling and weakness.       (-) gait disturbance      Objective:     BP 100/60   Pulse 72   Temp 97.7 F (36.5 C) (Oral)   Ht 6\' 2"  (1.88 m)   Wt 265 lb 4 oz (120.3 kg)   SpO2 94%   BMI 34.06 kg/m   Orthostatic VS: Lying down: 107/74, 74 Sitting: 92/56, 85 Standing: 84/42, 95  Physical Exam Vitals reviewed.  Constitutional:      Appearance: Normal appearance.  HENT:     Head: Normocephalic and atraumatic.  Cardiovascular:     Rate and Rhythm: Normal rate and regular rhythm.  Pulmonary:     Effort: Pulmonary effort is normal.     Breath sounds: Normal breath sounds.  Chest:  Breasts:    Right: Normal. No swelling, bleeding, inverted nipple, mass, nipple discharge, skin change or tenderness.  Musculoskeletal:     Cervical back: Neck supple.  Skin:    General: Skin is warm and dry.  Neurological:     Mental Status: He is alert and oriented to person, place, and time.  Psychiatric:        Mood and Affect: Mood normal.        Behavior: Behavior normal.        Thought Content: Thought content normal.  Judgment: Judgment normal.      No results found for any visits on 07/11/22.    The ASCVD Risk score (Arnett DK, et al., 2019) failed to calculate for the following reasons:   The patient has a prior MI or stroke diagnosis    Assessment & Plan:   Problem List Items Addressed This Visit       Cardiovascular and Mediastinum   Orthostatic hypotension    Stop metoprolol, start carvedilol 3.125 mg twice daily.  Continue midodrine 3 times a day.  Continue to focus on hydration, use of compression stockings, and use of abdominal binder.  Follow-up in 2 to 4 weeks.       Relevant Medications   carvedilol (COREG) 3.125 MG tablet   Other Relevant Orders   EKG 12-Lead   Troponin I (High Sensitivity)   Coronary artery disease involving native coronary artery of native heart    Chronic, no chest pain today. EKG without ST-segment changes, does continue to have orthostatic hypotension.  Check stat to troponin, if elevated will call patient and recommend ER evaluation.  Continue DAPT, atorvastatin, nitroglycerin as needed. Stop metoprolol and change to carvedilol 3.125mg  BID to see if this helps with his orthostatic hypotension/dizziness. Patient encouraged to follow-up with cardiology, continue to focus on proper hydration during the day, continue compression stocking use, continue use of abdominal binder.  Follow-up in 2 to 4 weeks to check blood pressure.      Relevant Medications   carvedilol (COREG) 3.125 MG tablet   Other Relevant Orders   Troponin I (High Sensitivity)     Endocrine   Uncontrolled type 2 diabetes mellitus with hyperglycemia, with long-term current use of insulin   Relevant Medications   insulin isophane & regular human KwikPen (NOVOLIN 70/30 KWIKPEN) (70-30) 100 UNIT/ML KwikPen     Other   Vitamin D deficiency - Primary    Check serum level, pending result may consider keeping patient on weekly vitamin D2 supplementation or changing to daily vitamin D3 supplement.      Relevant Orders   VITAMIN D 25 Hydroxy (Vit-D Deficiency, Fractures)   Basic metabolic panel   I71 deficiency    Check serum vitamin B12 and CBC today.  Further recommendations may be made based on the results.  Schedule patient for subsequent IM B12 injection later this month.      Relevant Orders   Vitamin B12   Anemia    Incidental finding, patient does have history of B12 deficiency.  Check CBC, ferritin, iron, B12 level today. Further recommendations may be made based on these results.        Relevant Orders   CBC   Ferritin   Iron   Encounter for  hepatitis C screening test for low risk patient    Labs ordered, further recommendations may be made based upon the results.      Relevant Orders   Hepatitis C antibody   Nipple pain    Etiology unclear, no mass noted on breast exam.  Check prolactin level.  Follow-up in 2 to 4 weeks.      Relevant Orders   Prolactin    Return for 2-4 weeks with Tyshay Adee;schedule patient for nurses's visit for vitamin b12 injection around 07/21/22.    [name], NP

## 2022-07-11 NOTE — Assessment & Plan Note (Signed)
Incidental finding, patient does have history of B12 deficiency.  Check CBC, ferritin, iron, B12 level today. Further recommendations may be made based on these results.

## 2022-07-11 NOTE — Assessment & Plan Note (Signed)
Labs ordered, further recommendations may be made based upon the results. 

## 2022-07-11 NOTE — Assessment & Plan Note (Signed)
Etiology unclear, no mass noted on breast exam.  Check prolactin level.  Follow-up in 2 to 4 weeks.

## 2022-07-11 NOTE — Assessment & Plan Note (Signed)
Check serum level, pending result may consider keeping patient on weekly vitamin D2 supplementation or changing to daily vitamin D3 supplement.

## 2022-07-12 ENCOUNTER — Other Ambulatory Visit: Payer: Self-pay

## 2022-07-12 ENCOUNTER — Encounter (HOSPITAL_COMMUNITY): Payer: Self-pay

## 2022-07-12 ENCOUNTER — Inpatient Hospital Stay (HOSPITAL_COMMUNITY)
Admission: EM | Admit: 2022-07-12 | Discharge: 2022-07-16 | DRG: 394 | Disposition: A | Discharge status: Home / Self Care | Payer: 59 | Attending: Internal Medicine | Admitting: Internal Medicine

## 2022-07-12 ENCOUNTER — Other Ambulatory Visit: Payer: Self-pay | Admitting: Nurse Practitioner

## 2022-07-12 ENCOUNTER — Emergency Department (HOSPITAL_COMMUNITY): Payer: 59

## 2022-07-12 DIAGNOSIS — I252 Old myocardial infarction: Secondary | ICD-10-CM

## 2022-07-12 DIAGNOSIS — K633 Ulcer of intestine: Secondary | ICD-10-CM | POA: Diagnosis present

## 2022-07-12 DIAGNOSIS — K6389 Other specified diseases of intestine: Principal | ICD-10-CM | POA: Diagnosis present

## 2022-07-12 DIAGNOSIS — Z79899 Other long term (current) drug therapy: Secondary | ICD-10-CM

## 2022-07-12 DIAGNOSIS — D62 Acute posthemorrhagic anemia: Secondary | ICD-10-CM | POA: Diagnosis not present

## 2022-07-12 DIAGNOSIS — K2950 Unspecified chronic gastritis without bleeding: Secondary | ICD-10-CM | POA: Diagnosis present

## 2022-07-12 DIAGNOSIS — K921 Melena: Secondary | ICD-10-CM | POA: Diagnosis not present

## 2022-07-12 DIAGNOSIS — Z1211 Encounter for screening for malignant neoplasm of colon: Secondary | ICD-10-CM

## 2022-07-12 DIAGNOSIS — Z7982 Long term (current) use of aspirin: Secondary | ICD-10-CM

## 2022-07-12 DIAGNOSIS — K922 Gastrointestinal hemorrhage, unspecified: Secondary | ICD-10-CM | POA: Diagnosis present

## 2022-07-12 DIAGNOSIS — I10 Essential (primary) hypertension: Secondary | ICD-10-CM | POA: Diagnosis not present

## 2022-07-12 DIAGNOSIS — K648 Other hemorrhoids: Secondary | ICD-10-CM | POA: Diagnosis present

## 2022-07-12 DIAGNOSIS — I2510 Atherosclerotic heart disease of native coronary artery without angina pectoris: Secondary | ICD-10-CM | POA: Diagnosis present

## 2022-07-12 DIAGNOSIS — Z955 Presence of coronary angioplasty implant and graft: Secondary | ICD-10-CM

## 2022-07-12 DIAGNOSIS — Z7984 Long term (current) use of oral hypoglycemic drugs: Secondary | ICD-10-CM

## 2022-07-12 DIAGNOSIS — D120 Benign neoplasm of cecum: Secondary | ICD-10-CM

## 2022-07-12 DIAGNOSIS — R1032 Left lower quadrant pain: Secondary | ICD-10-CM | POA: Diagnosis not present

## 2022-07-12 DIAGNOSIS — Z531 Procedure and treatment not carried out because of patient's decision for reasons of belief and group pressure: Secondary | ICD-10-CM | POA: Diagnosis present

## 2022-07-12 DIAGNOSIS — Z7902 Long term (current) use of antithrombotics/antiplatelets: Secondary | ICD-10-CM

## 2022-07-12 DIAGNOSIS — D124 Benign neoplasm of descending colon: Secondary | ICD-10-CM | POA: Diagnosis present

## 2022-07-12 DIAGNOSIS — I951 Orthostatic hypotension: Secondary | ICD-10-CM | POA: Diagnosis present

## 2022-07-12 DIAGNOSIS — Z794 Long term (current) use of insulin: Secondary | ICD-10-CM

## 2022-07-12 DIAGNOSIS — K5900 Constipation, unspecified: Secondary | ICD-10-CM | POA: Diagnosis present

## 2022-07-12 DIAGNOSIS — K2100 Gastro-esophageal reflux disease with esophagitis, without bleeding: Secondary | ICD-10-CM | POA: Diagnosis present

## 2022-07-12 DIAGNOSIS — D649 Anemia, unspecified: Secondary | ICD-10-CM

## 2022-07-12 DIAGNOSIS — E860 Dehydration: Secondary | ICD-10-CM | POA: Diagnosis present

## 2022-07-12 DIAGNOSIS — I700 Atherosclerosis of aorta: Secondary | ICD-10-CM | POA: Diagnosis not present

## 2022-07-12 DIAGNOSIS — D123 Benign neoplasm of transverse colon: Secondary | ICD-10-CM | POA: Diagnosis present

## 2022-07-12 DIAGNOSIS — Z803 Family history of malignant neoplasm of breast: Secondary | ICD-10-CM

## 2022-07-12 DIAGNOSIS — Z8249 Family history of ischemic heart disease and other diseases of the circulatory system: Secondary | ICD-10-CM

## 2022-07-12 DIAGNOSIS — E1165 Type 2 diabetes mellitus with hyperglycemia: Secondary | ICD-10-CM

## 2022-07-12 LAB — COMPREHENSIVE METABOLIC PANEL
ALT: 22 U/L (ref 0–44)
AST: 19 U/L (ref 15–41)
Albumin: 3.7 g/dL (ref 3.5–5.0)
Alkaline Phosphatase: 96 U/L (ref 38–126)
Anion gap: 10 (ref 5–15)
BUN: 29 mg/dL — ABNORMAL HIGH (ref 6–20)
CO2: 23 mmol/L (ref 22–32)
Calcium: 8.7 mg/dL — ABNORMAL LOW (ref 8.9–10.3)
Chloride: 102 mmol/L (ref 98–111)
Creatinine, Ser: 1.33 mg/dL — ABNORMAL HIGH (ref 0.61–1.24)
GFR, Estimated: >60 mL/min (ref >60)
Glucose, Bld: 255 mg/dL — ABNORMAL HIGH (ref 70–99)
Potassium: 4.4 mmol/L (ref 3.5–5.1)
Sodium: 135 mmol/L (ref 135–145)
Total Bilirubin: 0.7 mg/dL (ref 0.3–1.2)
Total Protein: 7.4 g/dL (ref 6.5–8.1)

## 2022-07-12 LAB — POC OCCULT BLOOD, ED: Fecal Occult Bld: POSITIVE — AB

## 2022-07-12 LAB — CBC WITH DIFFERENTIAL/PLATELET
Abs Immature Granulocytes: 0.02 10*3/uL (ref 0.00–0.07)
Basophils Absolute: 0 10*3/uL (ref 0.0–0.1)
Basophils Relative: 1 %
Eosinophils Absolute: 0.1 10*3/uL (ref 0.0–0.5)
Eosinophils Relative: 1 %
HCT: 27.7 % — ABNORMAL LOW (ref 39.0–52.0)
Hemoglobin: 8.9 g/dL — ABNORMAL LOW (ref 13.0–17.0)
Immature Granulocytes: 0 %
Lymphocytes Relative: 23 %
Lymphs Abs: 1.5 10*3/uL (ref 0.7–4.0)
MCH: 27.8 pg (ref 26.0–34.0)
MCHC: 32.1 g/dL (ref 30.0–36.0)
MCV: 86.6 fL (ref 80.0–100.0)
Monocytes Absolute: 0.3 10*3/uL (ref 0.1–1.0)
Monocytes Relative: 4 %
Neutro Abs: 4.6 10*3/uL (ref 1.7–7.7)
Neutrophils Relative %: 71 %
Platelets: 283 10*3/uL (ref 150–400)
RBC: 3.2 MIL/uL — ABNORMAL LOW (ref 4.22–5.81)
RDW: 12.9 % (ref 11.5–15.5)
WBC: 6.5 10*3/uL (ref 4.0–10.5)
nRBC: 0 % (ref 0.0–0.2)

## 2022-07-12 LAB — PROTIME-INR
INR: 1.1 (ref 0.8–1.2)
Prothrombin Time: 13.7 seconds (ref 11.4–15.2)

## 2022-07-12 LAB — CBG MONITORING, ED: Glucose-Capillary: 91 mg/dL (ref 70–99)

## 2022-07-12 LAB — HEPATITIS C ANTIBODY: Hepatitis C Ab: NONREACTIVE

## 2022-07-12 LAB — PROLACTIN: Prolactin: 6.8 ng/mL (ref 2.0–18.0)

## 2022-07-12 MED ORDER — ACETAMINOPHEN 650 MG RE SUPP: 650.0000 mg | Freq: Four times a day (QID) | RECTAL | Status: DC | PRN

## 2022-07-12 MED ORDER — ASPIRIN 81 MG PO TBEC
81.0000 mg | DELAYED_RELEASE_TABLET | Freq: Every day | ORAL | Status: DC
  Administered 2022-07-13 – 2022-07-16 (×4): 81 mg via ORAL
  Filled 2022-07-12 (×4): qty 1

## 2022-07-12 MED ORDER — CARVEDILOL 3.125 MG PO TABS
3.1250 mg | ORAL_TABLET | Freq: Two times a day (BID) | ORAL | Status: DC
  Administered 2022-07-13 – 2022-07-16 (×6): 3.125 mg via ORAL
  Filled 2022-07-12 (×6): qty 1

## 2022-07-12 MED ORDER — PANTOPRAZOLE SODIUM 40 MG IV SOLR
40.0000 mg | Freq: Once | INTRAVENOUS | Status: AC
  Administered 2022-07-12: 40 mg via INTRAVENOUS
  Filled 2022-07-12: qty 10

## 2022-07-12 MED ORDER — PANTOPRAZOLE SODIUM 40 MG IV SOLR
40.0000 mg | Freq: Two times a day (BID) | INTRAVENOUS | Status: DC
  Administered 2022-07-13: 40 mg via INTRAVENOUS
  Filled 2022-07-12: qty 10

## 2022-07-12 MED ORDER — INSULIN ASPART 100 UNIT/ML IJ SOLN: 0.0000 [IU] | INTRAMUSCULAR | Status: DC

## 2022-07-12 MED ORDER — LACTATED RINGERS IV SOLN: INTRAVENOUS | Status: AC

## 2022-07-12 MED ORDER — ACETAMINOPHEN 325 MG PO TABS: 650.0000 mg | ORAL_TABLET | Freq: Four times a day (QID) | ORAL | Status: DC | PRN

## 2022-07-12 MED ORDER — MIDODRINE HCL 5 MG PO TABS
10.0000 mg | ORAL_TABLET | Freq: Three times a day (TID) | ORAL | Status: DC
  Administered 2022-07-13 – 2022-07-16 (×9): 10 mg via ORAL
  Filled 2022-07-12 (×9): qty 2

## 2022-07-12 MED ORDER — SENNOSIDES-DOCUSATE SODIUM 8.6-50 MG PO TABS
1.0000 | ORAL_TABLET | Freq: Every day | ORAL | Status: DC
  Administered 2022-07-12 – 2022-07-13 (×2): 1 via ORAL
  Filled 2022-07-12 (×3): qty 1

## 2022-07-12 MED ORDER — ATORVASTATIN CALCIUM 80 MG PO TABS
80.0000 mg | ORAL_TABLET | Freq: Every day | ORAL | Status: DC
  Administered 2022-07-13 – 2022-07-16 (×4): 80 mg via ORAL
  Filled 2022-07-12 (×4): qty 1

## 2022-07-12 MED ORDER — IOHEXOL 350 MG/ML SOLN
100.0000 mL | Freq: Once | INTRAVENOUS | Status: AC | PRN
  Administered 2022-07-12: 100 mL via INTRAVENOUS

## 2022-07-12 MED ORDER — ONDANSETRON HCL 4 MG PO TABS: 4.0000 mg | ORAL_TABLET | Freq: Four times a day (QID) | ORAL | Status: DC | PRN

## 2022-07-12 MED ORDER — TICAGRELOR 90 MG PO TABS
90.0000 mg | ORAL_TABLET | Freq: Two times a day (BID) | ORAL | Status: DC
  Administered 2022-07-12 – 2022-07-16 (×8): 90 mg via ORAL
  Filled 2022-07-12 (×8): qty 1

## 2022-07-12 MED ORDER — ONDANSETRON HCL 4 MG/2ML IJ SOLN: 4.0000 mg | Freq: Four times a day (QID) | INTRAMUSCULAR | Status: DC | PRN

## 2022-07-12 MED ORDER — INSULIN NPH (HUMAN) (ISOPHANE) 100 UNIT/ML ~~LOC~~ SUSP
5.0000 [IU] | Freq: Two times a day (BID) | SUBCUTANEOUS | Status: DC
  Filled 2022-07-12: qty 10

## 2022-07-12 NOTE — Assessment & Plan Note (Signed)
Continue home midodrine. 

## 2022-07-12 NOTE — Assessment & Plan Note (Addendum)
HGB 8.9 today down from 12.9 last month.  Due to GIB. Repeat CBC in AM Pt is Jehovah's witness, refuses blood products.

## 2022-07-12 NOTE — Assessment & Plan Note (Addendum)
Sensitive SSI Q4H while NPO. CBG 90 at time of note.

## 2022-07-12 NOTE — ED Notes (Signed)
ED TO INPATIENT HANDOFF REPORT  ED Nurse Name and Phone #:  Aura Camps [phone]  S Name/Age/Gender Micheal Neal 52 y.o. male Room/Bed: 033C/033C  Code Status   Code Status: Prior  Home/SNF/Other Home Patient oriented to: self, place, time, and situation Is this baseline? Yes   Triage Complete: Triage complete  Chief Complaint Melena [K92.1]  Triage Note PT arrives via POV c/o dizziness, rectal bleeding- tarry stools. Saw PCP yesterday 4/11, PT states PCP told him to come to the ER because 'he was anemic and dehydrated'. Hx of orthostatic hypotension and low BP. Endorses pain of 5/10 in LLQ 2 days ago. Denies pain today, A/Ox4. Denies Hx of diverticulitis.   Allergies No Known Allergies  Level of Care/Admitting Diagnosis ED Disposition     ED Disposition  Admit   Condition  --   Comment  Hospital Area: [facility] [[id number]]  Level of Care: Telemetry Medical [104]  May place patient in observation at [facility] or [facility] if equivalent level of care is available:: No  Covid Evaluation: Asymptomatic - no recent exposure (last 10 days) testing not required  Diagnosis: Melena [[id number]]  Admitting Physician: Hillary Bow [[id number]]  Attending Physician: Hillary Bow [[id number]]          B Medical/Surgery History Past Medical History:  Diagnosis Date   Abnormal EKG    Anemia 07/11/2022   Contusion of left foot    Diabetes mellitus without complication    Dizziness    Encounter for hepatitis C screening test for low risk patient 07/11/2022   GERD (gastroesophageal reflux disease)    Near syncope    Palpitations    Vertigo    Past Surgical History:  Procedure Laterality Date   CORONARY STENT INTERVENTION N/A 05/20/2022   Procedure: CORONARY STENT INTERVENTION;  Surgeon: Runell Gess, MD;  Location: [facility] INVASIVE CV LAB;  Service: Cardiovascular;  Laterality: N/A;   LEFT HEART CATH AND CORONARY ANGIOGRAPHY N/A 05/20/2022   Procedure: LEFT  HEART CATH AND CORONARY ANGIOGRAPHY;  Surgeon: Runell Gess, MD;  Location: [facility] INVASIVE CV LAB;  Service: Cardiovascular;  Laterality: N/A;   NO PAST SURGERIES       A IV Location/Drains/Wounds Patient Lines/Drains/Airways Status     Active Line/Drains/Airways     Name Placement date Placement time Site Days   Peripheral IV 07/12/22 20 G Right Antecubital 07/12/22  1255  Antecubital  less than 1   Wound 02/01/13 Other (Comment) Axilla Right small cyst under right axilla, no drainage, painful to touch 02/01/13  0832  Axilla  3448            Intake/Output Last 24 hours No intake or output data in the 24 hours ending 07/12/22 2200  Labs/Imaging Results for orders placed or performed during the hospital encounter of 07/12/22 (from the past 48 hour(s))  Comprehensive metabolic panel     Status: Abnormal   Collection Time: 07/12/22 12:43 PM  Result Value Ref Range   Sodium 135 135 - 145 mmol/L   Potassium 4.4 3.5 - 5.1 mmol/L   Chloride 102 98 - 111 mmol/L   CO2 23 22 - 32 mmol/L   Glucose, Bld 255 (H) 70 - 99 mg/dL    Comment: Glucose reference range applies only to samples taken after fasting for at least 8 hours.   BUN 29 (H) 6 - 20 mg/dL   Creatinine, Ser 3.71 (H) 0.61 - 1.24 mg/dL   Calcium 8.7 (L) 8.9 -  10.3 mg/dL   Total Protein 7.4 6.5 - 8.1 g/dL   Albumin 3.7 3.5 - 5.0 g/dL   AST 19 15 - 41 U/L   ALT 22 0 - 44 U/L   Alkaline Phosphatase 96 38 - 126 U/L   Total Bilirubin 0.7 0.3 - 1.2 mg/dL   GFR, Estimated >16 >10 mL/min    Comment: (NOTE) Calculated using the CKD-EPI Creatinine Equation (2021)    Anion gap 10 5 - 15    Comment: Performed at [facility], [address] [place], [place] [address]  CBC with Differential     Status: Abnormal   Collection Time: 07/12/22 12:43 PM  Result Value Ref Range   WBC 6.5 4.0 - 10.5 K/uL   RBC 3.20 (L) 4.22 - 5.81 MIL/uL   Hemoglobin 8.9 (L) 13.0 - 17.0 g/dL   HCT 40.9 (L) 81.1 - 91.4 %   MCV 86.6 80.0 -  100.0 fL   MCH 27.8 26.0 - 34.0 pg   MCHC 32.1 30.0 - 36.0 g/dL   RDW 78.2 95.6 - 21.3 %   Platelets 283 150 - 400 K/uL   nRBC 0.0 0.0 - 0.2 %   Neutrophils Relative % 71 %   Neutro Abs 4.6 1.7 - 7.7 K/uL   Lymphocytes Relative 23 %   Lymphs Abs 1.5 0.7 - 4.0 K/uL   Monocytes Relative 4 %   Monocytes Absolute 0.3 0.1 - 1.0 K/uL   Eosinophils Relative 1 %   Eosinophils Absolute 0.1 0.0 - 0.5 K/uL   Basophils Relative 1 %   Basophils Absolute 0.0 0.0 - 0.1 K/uL   Immature Granulocytes 0 %   Abs Immature Granulocytes 0.02 0.00 - 0.07 K/uL    Comment: Performed at [facility], [address] [place], [place] [address]  Protime-INR     Status: None   Collection Time: 07/12/22 12:43 PM  Result Value Ref Range   Prothrombin Time 13.7 11.4 - 15.2 seconds   INR 1.1 0.8 - 1.2    Comment: (NOTE) INR goal varies based on device and disease states. Performed at [facility], [address], [place], [place] [address]   No blood products     Status: None (Preliminary result)   Collection Time: 07/12/22  1:05 PM  Result Value Ref Range   Transfuse no blood products PENDING   POC occult blood, ED     Status: Abnormal   Collection Time: 07/12/22  6:12 PM  Result Value Ref Range   Fecal Occult Bld POSITIVE (A) NEGATIVE   CT ANGIO GI BLEED  Result Date: 07/12/2022 CLINICAL DATA:  Left lower quadrant abdominal pain, GI bleed. EXAM: CTA ABDOMEN AND PELVIS WITHOUT AND WITH CONTRAST TECHNIQUE: Multidetector CT imaging of the abdomen and pelvis was performed using the standard protocol during bolus administration of intravenous contrast. Multiplanar reconstructed images and MIPs were obtained and reviewed to evaluate the vascular anatomy. RADIATION DOSE REDUCTION: This exam was performed according to the departmental dose-optimization program which includes automated exposure control, adjustment of the mA and/or kV according to patient size and/or use of iterative reconstruction  technique. CONTRAST:  OMNIPAQUE IOHEXOL 350 MG/ML SOLN COMPARISON:  None Available. FINDINGS: VASCULAR Aorta: Normal caliber aorta without aneurysm, dissection, vasculitis or significant stenosis. Minimal aortic atherosclerosis. Celiac: Patent without evidence of aneurysm, dissection, vasculitis or significant stenosis. SMA: Patent without evidence of aneurysm, dissection, vasculitis or significant stenosis. Renals: Both renal arteries are patent without evidence of  aneurysm, dissection, vasculitis, fibromuscular dysplasia or significant stenosis. IMA: Patent. Inflow: Patent without evidence of aneurysm, dissection, vasculitis or significant stenosis. Proximal Outflow: Bilateral common femoral and visualized portions of the superficial and profunda femoral arteries are patent without evidence of aneurysm, dissection, vasculitis or significant stenosis. Veins: No obvious venous abnormality. Review of the MIP images confirms the above findings. NON-VASCULAR Lower chest: The heart is normal in size and scattered coronary artery calcifications are noted. Minimal atelectasis is present at the lung bases. Hepatobiliary: No focal liver abnormality is seen. No gallstones, gallbladder wall thickening, or biliary dilatation. Pancreas: Unremarkable. No pancreatic ductal dilatation or surrounding inflammatory changes. Spleen: Normal in size without focal abnormality. Adrenals/Urinary Tract: The adrenal glands are within normal limits. The kidneys enhance symmetrically. No renal calculus or hydronephrosis. The bladder is unremarkable. Stomach/Bowel: Stomach is within normal limits. Appendix appears normal. No evidence of bowel wall thickening, distention, or inflammatory changes. No free air or pneumatosis. A moderate amount of retained stool is present in the colon. No acute hemorrhage is seen. Lymphatic: No abdominal or pelvic lymphadenopathy. Reproductive: Prostate is unremarkable. Other: No abdominopelvic ascites.  There is a small fat containing umbilical hernia. Musculoskeletal: No acute osseous abnormality. Mild degenerative changes are present in the thoracolumbar spine. IMPRESSION: VASCULAR 1. No evidence of acute hemorrhage. 2. Minimal aortic atherosclerosis. NON-VASCULAR 1. No acute process or evidence of acute hemorrhage. 2. Moderate amount of retained stool in the colon suggesting constipation. 3. Coronary artery calcifications. Electronically Signed   By: Thornell Sartorius M.D.   On: 07/12/2022 21:27    Pending Labs Unresulted Labs (From admission, onward)    None       Vitals/Pain Today's Vitals   07/12/22 2009 07/12/22 2030 07/12/22 2115 07/12/22 2130  BP:  111/89 127/76 120/71  Pulse:  80 87 76  Resp:  18 (!) 21 13  Temp: 98.6 F (37 C)     TempSrc: Oral     SpO2:  98% 98% 99%  PainSc:        Isolation Precautions No active isolations  Medications Medications  pantoprazole (PROTONIX) injection 40 mg (40 mg Intravenous Given 07/12/22 1716)  iohexol (OMNIPAQUE) 350 MG/ML injection 100 mL (100 mLs Intravenous Contrast Given 07/12/22 2106)    Mobility walks     Focused Assessments    R Recommendations: See Admitting Provider Note  Report given to:   Additional Notes:  Does not accept blood. Able to ambulate to [facility] with no assistance.

## 2022-07-12 NOTE — Progress Notes (Signed)
Let pt be aware of provider notes in regards to his lab and what she want him to do. Pt stated he is in the ER at the moment per provider orders

## 2022-07-12 NOTE — ED Provider Notes (Signed)
[place] [facility]GENCY DE[facility]MENT AT [facility] Provider Note   CSN: [id number] Arrival date & time: 07/12/22  1225     History  Chief Complaint  Patient presents with   Tarry Stools   Dizziness    Micheal Neal is a 52 y.o. male.  Patient with history of MI with stenting February 2024, currently on Brilinta and aspirin, history of diabetes --presents to the emergency department for evaluation of worsening anemia.  Patient has had dizzy spells, worse with standing for long time.  These have been much worse over the past 4 to 5 days.  He had a regular checkup appointment with his PCP a couple of days ago and had labs drawn.  He did mention his current symptoms.  After he was found to be anemic, and also had started having some blood noted in the stool, was encouraged to go to the emergency department for further evaluation.  Patient has had some watery stools with red blood noted x 2.  He denies any persistent abdominal pain but has had some intermittent left lower quadrant pain over the past month or so.  Denies other NSAID use and denies alcohol use.       Home Medications Prior to Admission medications   Medication Sig Start Date End Date Taking? Authorizing Provider  aspirin EC 81 MG tablet Take 1 tablet (81 mg total) by mouth daily. Swallow whole. 02/05/22   Etta Grandchild, MD  atorvastatin (LIPITOR) 80 MG tablet Take 1 tablet (80 mg total) by mouth daily. 05/22/22 05/17/23  Almon Hercules, MD  carvedilol (COREG) 3.125 MG tablet Take 1 tablet (3.125 mg total) by mouth 2 (two) times daily with a meal. 07/11/22   [name], NP  Continuous Blood Gluc Sensor (DEXCOM G7 SENSOR) MISC Use as directed. Change every 10 days. 05/31/22   Shamleffer, Konrad Dolores, MD  CYANOCOBALAMIN IJ Inject 1 Dose as directed every 30 (thirty) days.    [provider]  empagliflozin (JARDIANCE) 10 MG TABS tablet Take 1 tablet (10 mg total) by mouth daily. 06/26/22   [name], NP   fluorometholone (FML) 0.1 % ophthalmic suspension Instill 1 drop into right eye four times a day 06/03/22     glipiZIDE (GLUCOTROL XL) 10 MG 24 hr tablet Take 1 tablet (10 mg total) by mouth daily with breakfast. 05/31/22   Shamleffer, Konrad Dolores, MD  insulin isophane & regular human KwikPen (NOVOLIN 70/30 KWIKPEN) (70-30) 100 UNIT/ML KwikPen Inject 15 Units into the skin daily before breakfast AND 15 Units daily before supper. 07/11/22   [name], NP  Insulin Pen Needle (TRUEPLUS PEN NEEDLES) 31G X 5 MM MISC USE AS DIRECTED FOUR TIMES DAILY 05/31/22   Shamleffer, Konrad Dolores, MD  midodrine (PROAMATINE) 10 MG tablet Take 1 tablet (10 mg total) by mouth 3 (three) times daily. 07/01/22   Kathleene Hazel, MD  omega-3 acid ethyl esters (LOVAZA) 1 g capsule Take 2 capsules (2 g total) by mouth 2 (two) times daily. 11/07/21   Etta Grandchild, MD  terbinafine (LAMISIL) 250 MG tablet Take 1 tablet (250 mg total) by mouth daily. 06/07/22   Candelaria Stagers, DPM  ticagrelor (BRILINTA) 90 MG TABS tablet Take 1 tablet (90 mg total) by mouth 2 (two) times daily. 06/20/22   [name], NP  Vitamin D, Ergocalciferol, (DRISDOL) 1.25 MG (50000 UNIT) CAPS capsule Take 1 capsule (50,000 Units total) by mouth every 7 (seven) days. 06/06/22  [name], NP      Allergies    Patient has no known allergies.    Review of Systems   Review of Systems  Physical Exam Updated Vital Signs BP (!) 145/95   Pulse 85   Temp 98.5 F (36.9 C) (Oral)   Resp (!) 21   SpO2 99%   Physical Exam Vitals and nursing note reviewed. Exam conducted with a chaperone present.  Constitutional:      General: He is not in acute distress.    Appearance: He is well-developed.  HENT:     Head: Normocephalic and atraumatic.  Eyes:     General:        Right eye: No discharge.        Left eye: No discharge.     Conjunctiva/sclera: Conjunctivae normal.  Cardiovascular:     Rate and Rhythm: Normal rate and regular rhythm.      Heart sounds: Normal heart sounds.  Pulmonary:     Effort: Pulmonary effort is normal.     Breath sounds: Normal breath sounds.  Abdominal:     Palpations: Abdomen is soft.     Tenderness: There is no abdominal tenderness. There is no guarding or rebound.     Comments: Minimal LLQ tenderness to palpation, no rebound or guarding.   Genitourinary:    Rectum: Guaiac result positive. No tenderness or external hemorrhoid.     Comments: Mild amount of maroon blood on DRE Musculoskeletal:     Cervical back: Normal range of motion and neck supple.  Skin:    General: Skin is warm and dry.  Neurological:     Mental Status: He is alert.     ED Results / Procedures / Treatments   Labs (all labs ordered are listed, but only abnormal results are displayed) Labs Reviewed  COMPREHENSIVE METABOLIC PANEL - Abnormal; Notable for the following components:      Result Value   Glucose, Bld 255 (*)    BUN 29 (*)    Creatinine, Ser 1.33 (*)    Calcium 8.7 (*)    All other components within normal limits  CBC WITH DIFFERENTIAL/PLATELET - Abnormal; Notable for the following components:   RBC 3.20 (*)    Hemoglobin 8.9 (*)    HCT 27.7 (*)    All other components within normal limits  POC OCCULT BLOOD, ED - Abnormal; Notable for the following components:   Fecal Occult Bld POSITIVE (*)    All other components within normal limits  PROTIME-INR  CBC  BASIC METABOLIC PANEL  NO BLOOD PRODUCTS    EKG None  Radiology CT ANGIO GI BLEED  Result Date: 07/12/2022 CLINICAL DATA:  Left lower quadrant abdominal pain, GI bleed. EXAM: CTA ABDOMEN AND PELVIS WITHOUT AND WITH CONTRAST TECHNIQUE: Multidetector CT imaging of the abdomen and pelvis was performed using the standard protocol during bolus administration of intravenous contrast. Multiplanar reconstructed images and MIPs were obtained and reviewed to evaluate the vascular anatomy. RADIATION DOSE REDUCTION: This exam was performed according to the  departmental dose-optimization program which includes automated exposure control, adjustment of the mA and/or kV according to patient size and/or use of iterative reconstruction technique. CONTRAST:  OMNIPAQUE IOHEXOL 350 MG/ML SOLN COMPARISON:  None Available. FINDINGS: VASCULAR Aorta: Normal caliber aorta without aneurysm, dissection, vasculitis or significant stenosis. Minimal aortic atherosclerosis. Celiac: Patent without evidence of aneurysm, dissection, vasculitis or significant stenosis. SMA: Patent without evidence of aneurysm, dissection, vasculitis or significant stenosis. Renals: Both renal  arteries are patent without evidence of aneurysm, dissection, vasculitis, fibromuscular dysplasia or significant stenosis. IMA: Patent. Inflow: Patent without evidence of aneurysm, dissection, vasculitis or significant stenosis. Proximal Outflow: Bilateral common femoral and visualized portions of the superficial and profunda femoral arteries are patent without evidence of aneurysm, dissection, vasculitis or significant stenosis. Veins: No obvious venous abnormality. Review of the MIP images confirms the above findings. NON-VASCULAR Lower chest: The heart is normal in size and scattered coronary artery calcifications are noted. Minimal atelectasis is present at the lung bases. Hepatobiliary: No focal liver abnormality is seen. No gallstones, gallbladder wall thickening, or biliary dilatation. Pancreas: Unremarkable. No pancreatic ductal dilatation or surrounding inflammatory changes. Spleen: Normal in size without focal abnormality. Adrenals/Urinary Tract: The adrenal glands are within normal limits. The kidneys enhance symmetrically. No renal calculus or hydronephrosis. The bladder is unremarkable. Stomach/Bowel: Stomach is within normal limits. Appendix appears normal. No evidence of bowel wall thickening, distention, or inflammatory changes. No free air or pneumatosis. A moderate amount of retained stool is  present in the colon. No acute hemorrhage is seen. Lymphatic: No abdominal or pelvic lymphadenopathy. Reproductive: Prostate is unremarkable. Other: No abdominopelvic ascites. There is a small fat containing umbilical hernia. Musculoskeletal: No acute osseous abnormality. Mild degenerative changes are present in the thoracolumbar spine. IMPRESSION: VASCULAR 1. No evidence of acute hemorrhage. 2. Minimal aortic atherosclerosis. NON-VASCULAR 1. No acute process or evidence of acute hemorrhage. 2. Moderate amount of retained stool in the colon suggesting constipation. 3. Coronary artery calcifications. Electronically Signed   By: Thornell Sartorius M.D.   On: 07/12/2022 21:27    Procedures Procedures    Medications Ordered in ED Medications - No data to display  ED Course/ Medical Decision Making/ A&P    Patient seen and examined. History obtained directly from patient.  Reviewed previous cardiology and recent PCP notes.  Hemoglobin is downtrending.  Hemoglobin was 12.9 on 3/8, 10.7 on 4/11 and down to 8.9 checked today.  Labs/EKG: Reviewed and interpreted Labs ordered in triage today including CBC with hemoglobin 8.9, 6.5 white count; CMP with glucose of 255 with normal anion gap, creatinine at 1.33 with a BUN of 29; PT/INR of 1.1.  Hemoccult pending.  Imaging: Considering CT abdomen pelvis, awaiting Hemoccult  Medications/Fluids: Ordered: Protonix IV.  Most recent vital signs reviewed and are as follows: BP (!) 145/95   Pulse 85   Temp 98.5 F (36.9 C) (Oral)   Resp (!) 21   SpO2 99%   Initial impression: Concern for ongoing GI bleeding, worsening anemia.  6:16 PM Reassessment performed. Patient appears stable.  Patient was able to have a bowel movement and bedside rectal exam was performed with RN chaperone.  Patient with maroon stools.  Most current vital signs reviewed and are as follows: BP (!) 145/95   Pulse 85   Temp 98.5 F (36.9 C) (Oral)   Resp (!) 21   SpO2 99%   Plan: CT  GI bleeding protocol ordered.  After imaging completed, will need admission to hospital for continued monitoring.  10:09 PM Reassessment performed. Patient appears stable.  Imaging personally visualized and interpreted including: CT angio of the abdomen, agree no signs of intra-abdominal infection or bleeding.  Reviewed pertinent lab work and imaging with patient at bedside. Questions answered.   Most current vital signs reviewed and are as follows: BP 120/71   Pulse 76   Temp 98.6 F (37 C) (Oral)   Resp 13   SpO2 99%   Plan: Admit  to hospital.   I have consulted with Dr. Julian Reil of [facility] who will see and admit patient.  I have consulted with Dr. Tomasa Rand by telephone of [facility].  They will see patient in the morning.  Currently recommends clear liquids tonight, n.p.o. after midnight, will consider endoscopy/colonoscopy tomorrow.                            Medical Decision Making Amount and/or Complexity of Data Reviewed Radiology: ordered.  Risk Prescription drug management. Decision regarding hospitalization.   For this patient's complaint of abdominal pain, the following conditions were considered on the differential diagnosis: gastritis/PUD, enteritis/duodenitis, appendicitis, cholelithiasis/cholecystitis, cholangitis, pancreatitis, ruptured viscus, colitis, diverticulitis, small/large bowel obstruction, proctitis, cystitis, pyelonephritis, ureteral colic, aortic dissection, aortic aneurysm. Atypical chest etiologies were also considered including ACS, PE, and pneumonia.  The patient's vital signs, pertinent lab work and imaging were reviewed and interpreted as discussed in the ED course. Hospitalization was considered for further testing, treatments, or serial exams/observation. However as patient is well-appearing, has a stable exam, and reassuring studies today, I do not feel that they warrant admission at this time. This plan was discussed with the patient  who verbalizes agreement and comfort with this plan and seems reliable and able to return to the Emergency Department with worsening or changing symptoms.           Final Clinical Impression(s) / ED Diagnoses Final diagnoses:  Gastrointestinal hemorrhage, unspecified gastrointestinal hemorrhage type    Rx / DC Orders ED Discharge Orders     None         Renne Crigler, Cordelia Poche 07/12/22 2211    Wynetta Fines, MD 07/12/22 [phone]

## 2022-07-12 NOTE — Assessment & Plan Note (Addendum)
DES in Feb (less than 2 months ago). So dont really have much choice but to continue the DAPT at this time despite GIB. Discussed this with patient. Confirmed this with cards in curbside.

## 2022-07-12 NOTE — H&P (Signed)
History and Physical    Patient: Micheal Neal RXV:[id number] DOB: [birth year] DOA: 07/12/2022 DOS: the patient was seen and examined on 07/12/2022 PCP: [name], NP  Patient coming from: Home  Chief Complaint:  Chief Complaint  Patient presents with   Tarry Stools   Dizziness   HPI: Micheal Neal is a 52 y.o. male with medical history significant of DM, CAD with NSTEMI in Feb, got DES, on DAPT.  Orthostatic hypotension on midodrine.  Pt in to ED with worsening dizziness on standing for past 4-5 days.  Checkup with PCP a couple of days ago, labs drawn, found to be more anemic.  Also noted to have blood in stool: black tarry stool last week, had constipation last week.  This became red a couple of days ago, watery and now finally maroon colored.  He denies any persistent abdominal pain but has had some intermittent left lower quadrant pain over the past month or so. Denies other NSAID use and denies alcohol use.    Review of Systems: As mentioned in the history of present illness. All other systems reviewed and are negative. Past Medical History:  Diagnosis Date   Abnormal EKG    Anemia 07/11/2022   Contusion of left foot    Diabetes mellitus without complication    Dizziness    Encounter for hepatitis C screening test for low risk patient 07/11/2022   GERD (gastroesophageal reflux disease)    Near syncope    Palpitations    Vertigo    Past Surgical History:  Procedure Laterality Date   CORONARY STENT INTERVENTION N/A 05/20/2022   Procedure: CORONARY STENT INTERVENTION;  Surgeon: Runell Gess, MD;  Location: [facility] INVASIVE CV LAB;  Service: Cardiovascular;  Laterality: N/A;   LEFT HEART CATH AND CORONARY ANGIOGRAPHY N/A 05/20/2022   Procedure: LEFT HEART CATH AND CORONARY ANGIOGRAPHY;  Surgeon: Runell Gess, MD;  Location: [facility] INVASIVE CV LAB;  Service: Cardiovascular;  Laterality: N/A;   NO PAST SURGERIES     Social History:  reports that he has never smoked. He has  never used smokeless tobacco. He reports that he does not drink alcohol and does not use drugs.  No Known Allergies  Family History  Problem Relation Age of Onset   Heart disease Mother    Breast cancer Maternal Grandmother        diagnosed late 59's   Breast cancer Maternal Aunt     Prior to Admission medications   Medication Sig Start Date End Date Taking? Authorizing Provider  aspirin EC 81 MG tablet Take 1 tablet (81 mg total) by mouth daily. Swallow whole. 02/05/22   Etta Grandchild, MD  atorvastatin (LIPITOR) 80 MG tablet Take 1 tablet (80 mg total) by mouth daily. 05/22/22 05/17/23  Almon Hercules, MD  carvedilol (COREG) 3.125 MG tablet Take 1 tablet (3.125 mg total) by mouth 2 (two) times daily with a meal. 07/11/22   [name], NP  Continuous Blood Gluc Sensor (DEXCOM G7 SENSOR) MISC Use as directed. Change every 10 days. 05/31/22   Shamleffer, Konrad Dolores, MD  CYANOCOBALAMIN IJ Inject 1 Dose as directed every 30 (thirty) days.    [provider]  empagliflozin (JARDIANCE) 10 MG TABS tablet Take 1 tablet (10 mg total) by mouth daily. 06/26/22   [name], NP  fluorometholone (FML) 0.1 % ophthalmic suspension Instill 1 drop into right eye four times a day 06/03/22     glipiZIDE (GLUCOTROL XL) 10 MG 24  hr tablet Take 1 tablet (10 mg total) by mouth daily with breakfast. 05/31/22   Shamleffer, Konrad Dolores, MD  insulin isophane & regular human KwikPen (NOVOLIN 70/30 KWIKPEN) (70-30) 100 UNIT/ML KwikPen Inject 15 Units into the skin daily before breakfast AND 15 Units daily before supper. 07/11/22   [name], NP  Insulin Pen Needle (TRUEPLUS PEN NEEDLES) 31G X 5 MM MISC USE AS DIRECTED FOUR TIMES DAILY 05/31/22   Shamleffer, Konrad Dolores, MD  midodrine (PROAMATINE) 10 MG tablet Take 1 tablet (10 mg total) by mouth 3 (three) times daily. 07/01/22   Kathleene Hazel, MD  omega-3 acid ethyl esters (LOVAZA) 1 g capsule Take 2 capsules (2 g total) by mouth 2 (two) times  daily. 11/07/21   Etta Grandchild, MD  terbinafine (LAMISIL) 250 MG tablet Take 1 tablet (250 mg total) by mouth daily. 06/07/22   Candelaria Stagers, DPM  ticagrelor (BRILINTA) 90 MG TABS tablet Take 1 tablet (90 mg total) by mouth 2 (two) times daily. 06/20/22   [name], NP  Vitamin D, Ergocalciferol, (DRISDOL) 1.25 MG (50000 UNIT) CAPS capsule Take 1 capsule (50,000 Units total) by mouth every 7 (seven) days. 06/06/22   [name], NP    Physical Exam: Vitals:   07/12/22 2009 07/12/22 2030 07/12/22 2115 07/12/22 2130  BP:  111/89 127/76 120/71  Pulse:  80 87 76  Resp:  18 (!) 21 13  Temp: 98.6 F (37 C)     TempSrc: Oral     SpO2:  98% 98% 99%   Constitutional: NAD, calm, comfortable Eyes: PERRL, lids and conjunctivae normal ENMT: Mucous membranes are moist. Posterior pharynx clear of any exudate or lesions.Normal dentition.  Neck: normal, supple, no masses, no thyromegaly Respiratory: clear to auscultation bilaterally, no wheezing, no crackles. Normal respiratory effort. No accessory muscle use.  Cardiovascular: Regular rate and rhythm, no murmurs / rubs / gallops. No extremity edema. 2+ pedal pulses. No carotid bruits.  Abdomen: no tenderness, no masses palpated. No hepatosplenomegaly. Bowel sounds positive.  Musculoskeletal: no clubbing / cyanosis. No joint deformity upper and lower extremities. Good ROM, no contractures. Normal muscle tone.  Skin: no rashes, lesions, ulcers. No induration Neurologic: CN 2-12 grossly intact. Sensation intact, DTR normal. Strength 5/5 in all 4.  Psychiatric: Normal judgment and insight. Alert and oriented x 3. Normal mood.   Data Reviewed:    Hemoccult positive     Latest Ref Rng & Units 07/12/2022   12:43 PM 07/11/2022   10:45 AM 06/07/2022    8:14 AM  CBC  WBC 4.0 - 10.5 K/uL 6.5  7.1  6.7   Hemoglobin 13.0 - 17.0 g/dL 8.9  16.1  09.6   Hematocrit 39.0 - 52.0 % 27.7  32.3  38.4   Platelets 150 - 400 K/uL 283  305.0  246.0        Latest  Ref Rng & Units 07/12/2022   12:43 PM 07/11/2022   10:45 AM 06/07/2022    8:34 AM  CMP  Glucose 70 - 99 mg/dL 045  409    BUN 6 - 20 mg/dL 29  32    Creatinine 8.11 - 1.24 mg/dL 9.14  7.82    Sodium 956 - 145 mmol/L 135  137    Potassium 3.5 - 5.1 mmol/L 4.4  4.4    Chloride 98 - 111 mmol/L 102  102    CO2 22 - 32 mmol/L 23  27    Calcium  8.9 - 10.3 mg/dL 8.7  9.2    Total Protein 6.5 - 8.1 g/dL 7.4   7.5   Total Bilirubin 0.3 - 1.2 mg/dL 0.7   0.7   Alkaline Phos 38 - 126 U/L 96   75   AST 15 - 41 U/L 19   26   ALT 0 - 44 U/L 22   24     Assessment and Plan: * Hematochezia Empiric IV protonix Clear liquid diet and NPO after MN IVF Tele monitor EDP d/w [facility], they will see in AM.  ABLA (acute blood loss anemia) HGB 8.9 today down from 12.9 last month.  Due to GIB. Repeat CBC in AM Pt is Jehovah's witness, refuses blood products.  Coronary artery disease involving native coronary artery of native heart DES in Feb (less than 2 months ago). So dont really have much choice but to continue the DAPT at this time despite GIB. Discussed this with patient.  Orthostatic hypotension Continue home midodrine  Uncontrolled type 2 diabetes mellitus with hyperglycemia, with long-term current use of insulin Sensitive SSI Q4H while NPO. CBG 90 at time of note.      Advance Care Planning:   Code Status: Full Code Pt affirms he is a Jehovah's witness, declines blood transfusions.  Consults: EDP d/w Tomasa Rand who will consult in AM  Curbsided Dr. Andres Ege with cards  Family Communication: No family in Room  Severity of Illness: The appropriate patient status for this patient is OBSERVATION. Observation status is judged to be reasonable and necessary in order to provide the required intensity of service to ensure the patient's safety. The patient's presenting symptoms, physical exam findings, and initial radiographic and laboratory data in the context of their medical condition is  felt to place them at decreased risk for further clinical deterioration. Furthermore, it is anticipated that the patient will be medically stable for discharge from the hospital within 2 midnights of admission.   Author: Hillary Bow., DO 07/12/2022 11:40 PM  For on call review [email or web address].

## 2022-07-12 NOTE — ED Provider Triage Note (Signed)
Emergency Medicine Provider Triage Evaluation Note  Micheal Neal , a 52 y.o. male  was evaluated in triage.  Pt complains of dizziness and dark stools.  He saw his PCP yesterday and they told him that his labs were revealing of anemia and dehydration.  They also said that he had low blood pressure with a history of orthostatic hypotension.  Over the past couple days he has also noted dark stool and left lower quadrant pain.  No history of diverticulosis.  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  He says that "they do the other 1."  Review of Systems  Positive:  Negative:   Physical Exam  BP (!) 86/69 (BP Location: Right Arm)   Pulse 95   Temp 97.6 F (36.4 C)   Resp 18   SpO2 100%  Gen:   Awake, no distress   Resp:  Normal effort  MSK:   Moves extremities without difficulty  Other:  Mild LLQ tenderness  Medical Decision Making  Medically screening exam initiated at 12:45 PM.  Appropriate orders placed.  Myles Slocumb was informed that the remainder of the evaluation will be completed by another provider, this initial triage assessment does not replace that evaluation, and the importance of remaining in the ED until their evaluation is complete.     Saddie Benders, PA-C 07/12/22 1246

## 2022-07-12 NOTE — ED Notes (Signed)
PT couldn't remaining standing for for the last set of VS due to feeling extremely dizzy . BP gradually dropped while standing .

## 2022-07-12 NOTE — ED Triage Notes (Signed)
PT arrives via POV c/o dizziness, rectal bleeding- tarry stools. Saw PCP yesterday 4/11, PT states PCP told him to come to the ER because 'he was anemic and dehydrated'. Hx of orthostatic hypotension and low BP. Endorses pain of 5/10 in LLQ 2 days ago. Denies pain today, A/Ox4. Denies Hx of diverticulitis.

## 2022-07-12 NOTE — Progress Notes (Signed)
Please call patient and let him know that his hemoglobin has dropped about 2 points over the last month indicating that his anemia is getting worse.   Some causes of anemia include b12 deficiency, iron deficiency, or bleeding in the GI tract. His last B12 levels and iron levels in his blood are normal so I do not feel the deficiency is the cause. Because he is on dual antiplatelet therapy (aspirin and Brilinta) as part of his treatment of recent MI I am concerned about possible bleed or concerned that his red blood cells are turning over too rapidly. Thus, I have discussed the situation with my supervising physician and have ordered referral to hematologist and gastroenterologist to help determine the cause of his anemia so he can be treated appropriately.   Additionally, I would like him to come to the lab for additional testing including blood work to look for signs of rapid RBC turnover and to have hemoccult cards provided to him so he can do at-home stool collection and have this tested for occult blood in his stool. Please make sure he gets the hemoccult cards and he is educated on how to collect sample and explain the above to him as well.   Please let me know if any clarification is needed or if he has any questions.

## 2022-07-12 NOTE — Assessment & Plan Note (Addendum)
Empiric IV protonix Clear liquid diet and NPO after MN IVF Tele monitor EDP d/w  GI, they will see in AM.

## 2022-07-13 ENCOUNTER — Observation Stay (HOSPITAL_COMMUNITY): Payer: 59 | Admitting: Critical Care Medicine

## 2022-07-13 ENCOUNTER — Encounter (HOSPITAL_COMMUNITY): Payer: Self-pay | Admitting: Internal Medicine

## 2022-07-13 ENCOUNTER — Encounter (HOSPITAL_COMMUNITY)
Admission: EM | Disposition: A | Discharge status: Home / Self Care | Payer: Self-pay | Source: Home / Self Care | Attending: Internal Medicine

## 2022-07-13 DIAGNOSIS — I2510 Atherosclerotic heart disease of native coronary artery without angina pectoris: Secondary | ICD-10-CM

## 2022-07-13 DIAGNOSIS — K6389 Other specified diseases of intestine: Secondary | ICD-10-CM | POA: Diagnosis not present

## 2022-07-13 DIAGNOSIS — Z955 Presence of coronary angioplasty implant and graft: Secondary | ICD-10-CM

## 2022-07-13 DIAGNOSIS — K648 Other hemorrhoids: Secondary | ICD-10-CM | POA: Diagnosis not present

## 2022-07-13 DIAGNOSIS — I252 Old myocardial infarction: Secondary | ICD-10-CM

## 2022-07-13 DIAGNOSIS — K921 Melena: Secondary | ICD-10-CM | POA: Diagnosis not present

## 2022-07-13 DIAGNOSIS — K2100 Gastro-esophageal reflux disease with esophagitis, without bleeding: Secondary | ICD-10-CM | POA: Diagnosis not present

## 2022-07-13 DIAGNOSIS — K922 Gastrointestinal hemorrhage, unspecified: Secondary | ICD-10-CM | POA: Diagnosis not present

## 2022-07-13 DIAGNOSIS — D649 Anemia, unspecified: Secondary | ICD-10-CM | POA: Diagnosis not present

## 2022-07-13 DIAGNOSIS — K2950 Unspecified chronic gastritis without bleeding: Secondary | ICD-10-CM | POA: Diagnosis not present

## 2022-07-13 DIAGNOSIS — I951 Orthostatic hypotension: Secondary | ICD-10-CM | POA: Diagnosis not present

## 2022-07-13 DIAGNOSIS — K2951 Unspecified chronic gastritis with bleeding: Secondary | ICD-10-CM | POA: Diagnosis not present

## 2022-07-13 DIAGNOSIS — D62 Acute posthemorrhagic anemia: Secondary | ICD-10-CM | POA: Diagnosis not present

## 2022-07-13 DIAGNOSIS — E119 Type 2 diabetes mellitus without complications: Secondary | ICD-10-CM

## 2022-07-13 DIAGNOSIS — D124 Benign neoplasm of descending colon: Secondary | ICD-10-CM | POA: Diagnosis not present

## 2022-07-13 DIAGNOSIS — D123 Benign neoplasm of transverse colon: Secondary | ICD-10-CM | POA: Diagnosis not present

## 2022-07-13 HISTORY — PX: BIOPSY: SHX5522

## 2022-07-13 HISTORY — PX: ESOPHAGOGASTRODUODENOSCOPY: SHX5428

## 2022-07-13 HISTORY — DX: Gastrointestinal hemorrhage, unspecified: K92.2

## 2022-07-13 HISTORY — DX: Old myocardial infarction: I25.2

## 2022-07-13 LAB — BASIC METABOLIC PANEL
Anion gap: 11 (ref 5–15)
BUN: 23 mg/dL — ABNORMAL HIGH (ref 6–20)
CO2: 23 mmol/L (ref 22–32)
Calcium: 8.6 mg/dL — ABNORMAL LOW (ref 8.9–10.3)
Chloride: 102 mmol/L (ref 98–111)
Creatinine, Ser: 1.22 mg/dL (ref 0.61–1.24)
GFR, Estimated: >60 mL/min (ref >60)
Glucose, Bld: 100 mg/dL — ABNORMAL HIGH (ref 70–99)
Potassium: 3.8 mmol/L (ref 3.5–5.1)
Sodium: 136 mmol/L (ref 135–145)

## 2022-07-13 LAB — CBC
HCT: 26.3 % — ABNORMAL LOW (ref 39.0–52.0)
HCT: 26.3 % — ABNORMAL LOW (ref 39.0–52.0)
Hemoglobin: 8.7 g/dL — ABNORMAL LOW (ref 13.0–17.0)
Hemoglobin: 8.6 g/dL — ABNORMAL LOW (ref 13.0–17.0)
MCH: 28.2 pg (ref 26.0–34.0)
MCH: 27.8 pg (ref 26.0–34.0)
MCHC: 33.1 g/dL (ref 30.0–36.0)
MCHC: 32.7 g/dL (ref 30.0–36.0)
MCV: 85.4 fL (ref 80.0–100.0)
MCV: 85.1 fL (ref 80.0–100.0)
Platelets: 265 10*3/uL (ref 150–400)
Platelets: 266 10*3/uL (ref 150–400)
RBC: 3.08 MIL/uL — ABNORMAL LOW (ref 4.22–5.81)
RBC: 3.09 MIL/uL — ABNORMAL LOW (ref 4.22–5.81)
RDW: 13 % (ref 11.5–15.5)
RDW: 13 % (ref 11.5–15.5)
WBC: 6.7 10*3/uL (ref 4.0–10.5)
WBC: 5.3 10*3/uL (ref 4.0–10.5)
nRBC: 0 % (ref 0.0–0.2)
nRBC: 0 % (ref 0.0–0.2)

## 2022-07-13 LAB — GLUCOSE, CAPILLARY
Glucose-Capillary: 97 mg/dL (ref 70–99)
Glucose-Capillary: 87 mg/dL (ref 70–99)
Glucose-Capillary: 90 mg/dL (ref 70–99)
Glucose-Capillary: 88 mg/dL (ref 70–99)
Glucose-Capillary: 104 mg/dL — ABNORMAL HIGH (ref 70–99)
Glucose-Capillary: 109 mg/dL — ABNORMAL HIGH (ref 70–99)

## 2022-07-13 SURGERY — EGD (ESOPHAGOGASTRODUODENOSCOPY)
Anesthesia: Monitor Anesthesia Care

## 2022-07-13 MED ORDER — INSULIN ASPART 100 UNIT/ML IJ SOLN: 0.0000 [IU] | Freq: Three times a day (TID) | INTRAMUSCULAR | Status: DC

## 2022-07-13 MED ORDER — ACETAMINOPHEN 325 MG PO TABS: 650.0000 mg | ORAL_TABLET | Freq: Four times a day (QID) | ORAL | Status: DC | PRN

## 2022-07-13 MED ORDER — PROPOFOL 10 MG/ML IV BOLUS
INTRAVENOUS | Status: DC | PRN
  Administered 2022-07-13: 20 mg via INTRAVENOUS
  Administered 2022-07-13: 10 mg via INTRAVENOUS
  Administered 2022-07-13: 20 mg via INTRAVENOUS
  Administered 2022-07-13: 30 mg via INTRAVENOUS

## 2022-07-13 MED ORDER — PANTOPRAZOLE SODIUM 40 MG PO TBEC
40.0000 mg | DELAYED_RELEASE_TABLET | Freq: Every day | ORAL | Status: DC
  Administered 2022-07-13 – 2022-07-16 (×4): 40 mg via ORAL
  Filled 2022-07-13 (×4): qty 1

## 2022-07-13 MED ORDER — INSULIN ASPART 100 UNIT/ML IJ SOLN: 0.0000 [IU] | Freq: Every day | INTRAMUSCULAR | Status: DC

## 2022-07-13 MED ORDER — PROPOFOL 500 MG/50ML IV EMUL
INTRAVENOUS | Status: DC | PRN
  Administered 2022-07-13: 125 ug/kg/min via INTRAVENOUS

## 2022-07-13 MED ORDER — PHENYLEPHRINE 80 MCG/ML (10ML) SYRINGE FOR IV PUSH (FOR BLOOD PRESSURE SUPPORT)
PREFILLED_SYRINGE | INTRAVENOUS | Status: DC | PRN
  Administered 2022-07-13: 80 ug via INTRAVENOUS

## 2022-07-13 NOTE — Plan of Care (Signed)

## 2022-07-13 NOTE — Anesthesia Preprocedure Evaluation (Addendum)
Anesthesia Evaluation  Patient identified by MRN, date of birth, ID band Patient awake    Reviewed: Allergy & Precautions, H&P , NPO status , Patient's Chart, lab work & pertinent test results  Airway Mallampati: II   Neck ROM: full    Dental   Pulmonary neg pulmonary ROS   breath sounds clear to auscultation       Cardiovascular + CAD and + Cardiac Stents   Rhythm:regular Rate:Normal  TTE (05/21/22): EF 60-65%  Cath (05/20/22): stent placement.  Pt is on brillinta   Neuro/Psych    GI/Hepatic ,GERD  ,,GI bleeding   Endo/Other  diabetes, Type 2    Renal/GU      Musculoskeletal  (+) Arthritis ,    Abdominal   Peds  Hematology  (+) Blood dyscrasia, anemia Hemoglobin 8.7 (07/13/22)   Anesthesia Other Findings   Reproductive/Obstetrics                             Anesthesia Physical Anesthesia Plan  ASA: 3  Anesthesia Plan: MAC   Post-op Pain Management:    Induction: Intravenous  PONV Risk Score and Plan: 1 and Propofol infusion and Treatment may vary due to age or medical condition  Airway Management Planned: Nasal Cannula  Additional Equipment:   Intra-op Plan:   Post-operative Plan:   Informed Consent: I have reviewed the patients History and Physical, chart, labs and discussed the procedure including the risks, benefits and alternatives for the proposed anesthesia with the patient or authorized representative who has indicated his/her understanding and acceptance.     Dental advisory given  Plan Discussed with: CRNA, Anesthesiologist and Surgeon  Anesthesia Plan Comments:        Anesthesia Quick Evaluation

## 2022-07-13 NOTE — TOC Initial Note (Signed)
Consultation  Referring Provider:   Dr. Julian Reil   Primary Care Physician:  [name], NP Primary Gastroenterologist:        N/A Reason for Consultation:     Anemia, suspected GI bleed         HPI:   Micheal Neal is a 52 y.o. male with a history of CAD (NSTEMI Feb 2024) s/p DES on DAPT with chronic orthostatic hypotension who reports having some black stools recently in the past week along with fleeting nausea and abdominal pain, found to have a 2 point drop in hgb at a recent PCP visit.  He then experienced 2 maroon colored stools 2 days ago, and was advised to report to the ED.  He did not have a bowel movement yesterday, and he is no longer having any nausea or abdominal pain.  He has chronic GERD symptoms, but does not take medications and his GERD symptoms have not changed recently.  He has chronic orthostasis and is on midodrine.  Denies any significant change in his baseline symptoms.  No syncope.   No previous history of GI bleed.  No family history of GI malignancy. No prior endoscopy.  He used to take motrin daily, but states that he has stopped since his stents were placed upon recommendation from his cardiologist.  His baseline hemoglobin seems to be around 12-13.  At his PCP visit on April 11th, hgb was 10.7 and was 8.9 in the ED.  FOBT positive in ED  BUN 32 on April 11th, improved to 23 today.  CT-A in the ED was negative for active bleeding.  No inflammatory changes in the GI tract noted. Hemodynamically stable.  Past Medical History:  Diagnosis Date   Abnormal EKG    Anemia 07/11/2022   Contusion of left foot    Diabetes mellitus without complication    Dizziness    Encounter for hepatitis C screening test for low risk patient 07/11/2022   GERD (gastroesophageal reflux disease)    Near syncope    Palpitations    Vertigo     Past Surgical History:  Procedure Laterality Date   CORONARY STENT INTERVENTION N/A 05/20/2022   Procedure: CORONARY STENT  INTERVENTION;  Surgeon: Runell Gess, MD;  Location: [facility];  Service: Cardiovascular;  Laterality: N/A;   LEFT HEART CATH AND CORONARY ANGIOGRAPHY N/A 05/20/2022   Procedure: LEFT HEART CATH AND CORONARY ANGIOGRAPHY;  Surgeon: Runell Gess, MD;  Location: [facility];  Service: Cardiovascular;  Laterality: N/A;   NO PAST SURGERIES      Family History  Problem Relation Age of Onset   Heart disease Mother    Breast cancer Maternal Grandmother        diagnosed late 58's   Breast cancer Maternal Aunt      Social History   Tobacco Use   Smoking status: Never   Smokeless tobacco: Never  Vaping Use   Vaping Use: Never used  Substance Use Topics   Alcohol use: No   Drug use: No    Prior to Admission medications   Medication Sig Start Date End Date Taking? Authorizing Provider  aspirin EC 81 MG tablet Take 1 tablet (81 mg total) by mouth daily. Swallow whole. 02/05/22   Etta Grandchild, MD  atorvastatin (LIPITOR) 80 MG tablet Take 1 tablet (80 mg total) by mouth daily. 05/22/22 05/17/23  Almon Hercules, MD  carvedilol (COREG) 3.125 MG tablet Take 1 tablet (3.125 mg total) by  mouth 2 (two) times daily with a meal. 07/11/22   [name], NP  Continuous Blood Gluc Sensor (DEXCOM G7 SENSOR) MISC Use as directed. Change every 10 days. 05/31/22   Shamleffer, Konrad Dolores, MD  CYANOCOBALAMIN IJ Inject 1 Dose as directed every 30 (thirty) days.    [provider]  empagliflozin (JARDIANCE) 10 MG TABS tablet Take 1 tablet (10 mg total) by mouth daily. 06/26/22   [name], NP  fluorometholone (FML) 0.1 % ophthalmic suspension Instill 1 drop into right eye four times a day 06/03/22     glipiZIDE (GLUCOTROL XL) 10 MG 24 hr tablet Take 1 tablet (10 mg total) by mouth daily with breakfast. 05/31/22   Shamleffer, Konrad Dolores, MD  insulin isophane & regular human KwikPen (NOVOLIN 70/30 KWIKPEN) (70-30) 100 UNIT/ML KwikPen Inject 15 Units into the skin daily before  breakfast AND 15 Units daily before supper. 07/11/22   [name], NP  Insulin Pen Needle (TRUEPLUS PEN NEEDLES) 31G X 5 MM MISC USE AS DIRECTED FOUR TIMES DAILY 05/31/22   Shamleffer, Konrad Dolores, MD  midodrine (PROAMATINE) 10 MG tablet Take 1 tablet (10 mg total) by mouth 3 (three) times daily. 07/01/22   Kathleene Hazel, MD  omega-3 acid ethyl esters (LOVAZA) 1 g capsule Take 2 capsules (2 g total) by mouth 2 (two) times daily. 11/07/21   Etta Grandchild, MD  terbinafine (LAMISIL) 250 MG tablet Take 1 tablet (250 mg total) by mouth daily. 06/07/22   Candelaria Stagers, DPM  ticagrelor (BRILINTA) 90 MG TABS tablet Take 1 tablet (90 mg total) by mouth 2 (two) times daily. 06/20/22   [name], NP  Vitamin D, Ergocalciferol, (DRISDOL) 1.25 MG (50000 UNIT) CAPS capsule Take 1 capsule (50,000 Units total) by mouth every 7 (seven) days. 06/06/22   [name], NP    Current Facility-Administered Medications  Medication Dose Route Frequency Provider Last Rate Last Admin   acetaminophen (TYLENOL) tablet 650 mg  650 mg Oral Q6H PRN Lonia Blood, MD       aspirin EC tablet 81 mg  81 mg Oral Daily Julian Reil, Jared M, DO       atorvastatin (LIPITOR) tablet 80 mg  80 mg Oral Daily Julian Reil, Jared M, DO       carvedilol (COREG) tablet 3.125 mg  3.125 mg Oral BID WC Lyda Perone M, DO       insulin aspart (novoLOG) injection 0-9 Units  0-9 Units Subcutaneous Q4H Lyda Perone M, DO       lactated ringers infusion   Intravenous Continuous Lonia Blood, MD 125 mL/hr at 07/13/22 0411 Infusion Verify at 07/13/22 0411   midodrine (PROAMATINE) tablet 10 mg  10 mg Oral TID WC Hillary Bow, DO       ondansetron [facility]) tablet 4 mg  4 mg Oral Q6H PRN Hillary Bow, DO       Or   ondansetron [facility]) injection 4 mg  4 mg Intravenous Q6H PRN Hillary Bow, DO       pantoprazole (PROTONIX) injection 40 mg  40 mg Intravenous Q12H Lyda Perone M, DO       senna-docusate (Senokot-S) tablet 1  tablet  1 tablet Oral QHS Hillary Bow, DO   1 tablet at 07/12/22 2251   ticagrelor (BRILINTA) tablet 90 mg  90 mg Oral BID Hillary Bow, DO   90 mg at 07/12/22 2251    Allergies as  of 07/12/2022   (No Known Allergies)     Review of Systems:    As per HPI, otherwise negative    Physical Exam:  Vital signs in last 24 hours: Temp:  [97.6 F (36.4 C)-98.6 F (37 C)] 98.4 F (36.9 C) (04/13 0626) Pulse Rate:  [73-95] 83 (04/13 0626) Resp:  [13-23] 18 (04/13 0626) BP: (86-145)/(69-95) 109/71 (04/13 0626) SpO2:  [98 %-100 %] 100 % (04/13 0626) Last BM Date : 07/12/22 General:   Pleasant Hispanic male in NAD Head:  Normocephalic and atraumatic. Eyes:   No icterus.   Conjunctiva pink. Ears:  Normal auditory acuity. Neck:  Supple Lungs:  Respirations even and unlabored. Lungs clear to auscultation bilaterally.   No wheezes, crackles, or rhonchi.  Heart:  Regular rate and rhythm; no MRG Abdomen:  Soft, nondistended, nontender. Normal bowel sounds. No appreciable masses or hepatomegaly.  Rectal:  Not performed.  Msk:  Symmetrical without gross deformities.  Extremities:  Without edema. Neurologic:  Alert and  oriented x4;  grossly normal neurologically. Skin:  Intact without significant lesions or rashes. Psych:  Alert and cooperative. Normal affect.  LAB RESULTS: Recent Labs    07/11/22 1045 07/12/22 1243 07/13/22 0233  WBC 7.1 6.5 6.7  HGB 10.7* 8.9* 8.7*  HCT 32.3* 27.7* 26.3*  PLT 305.0 283 265   BMET Recent Labs    07/11/22 1045 07/12/22 1243 07/13/22 0233  NA 137 135 136  K 4.4 4.4 3.8  CL 102 102 102  CO2 27 23 23   GLUCOSE 118* 255* 100*  BUN 32* 29* 23*  CREATININE 1.42 1.33* 1.22  CALCIUM 9.2 8.7* 8.6*   LFT Recent Labs    07/12/22 1243  PROT 7.4  ALBUMIN 3.7  AST 19  ALT 22  ALKPHOS 96  BILITOT 0.7   PT/INR Recent Labs    07/12/22 1243  LABPROT 13.7  INR 1.1    STUDIES: CT ANGIO GI BLEED  Result Date: 07/12/2022 CLINICAL  DATA:  Left lower quadrant abdominal pain, GI bleed. EXAM: CTA ABDOMEN AND PELVIS WITHOUT AND WITH CONTRAST TECHNIQUE: Multidetector CT imaging of the abdomen and pelvis was performed using the standard protocol during bolus administration of intravenous contrast. Multiplanar reconstructed images and MIPs were obtained and reviewed to evaluate the vascular anatomy. RADIATION DOSE REDUCTION: This exam was performed according to the departmental dose-optimization program which includes automated exposure control, adjustment of the mA and/or kV according to patient size and/or use of iterative reconstruction technique. CONTRAST:  OMNIPAQUE IOHEXOL 350 MG/ML SOLN COMPARISON:  None Available. FINDINGS: VASCULAR Aorta: Normal caliber aorta without aneurysm, dissection, vasculitis or significant stenosis. Minimal aortic atherosclerosis. Celiac: Patent without evidence of aneurysm, dissection, vasculitis or significant stenosis. SMA: Patent without evidence of aneurysm, dissection, vasculitis or significant stenosis. Renals: Both renal arteries are patent without evidence of aneurysm, dissection, vasculitis, fibromuscular dysplasia or significant stenosis. IMA: Patent. Inflow: Patent without evidence of aneurysm, dissection, vasculitis or significant stenosis. Proximal Outflow: Bilateral common femoral and visualized portions of the superficial and profunda femoral arteries are patent without evidence of aneurysm, dissection, vasculitis or significant stenosis. Veins: No obvious venous abnormality. Review of the MIP images confirms the above findings. NON-VASCULAR Lower chest: The heart is normal in size and scattered coronary artery calcifications are noted. Minimal atelectasis is present at the lung bases. Hepatobiliary: No focal liver abnormality is seen. No gallstones, gallbladder wall thickening, or biliary dilatation. Pancreas: Unremarkable. No pancreatic ductal dilatation or surrounding inflammatory changes.  Spleen: Normal in  size without focal abnormality. Adrenals/Urinary Tract: The adrenal glands are within normal limits. The kidneys enhance symmetrically. No renal calculus or hydronephrosis. The bladder is unremarkable. Stomach/Bowel: Stomach is within normal limits. Appendix appears normal. No evidence of bowel wall thickening, distention, or inflammatory changes. No free air or pneumatosis. A moderate amount of retained stool is present in the colon. No acute hemorrhage is seen. Lymphatic: No abdominal or pelvic lymphadenopathy. Reproductive: Prostate is unremarkable. Other: No abdominopelvic ascites. There is a small fat containing umbilical hernia. Musculoskeletal: No acute osseous abnormality. Mild degenerative changes are present in the thoracolumbar spine. IMPRESSION: VASCULAR 1. No evidence of acute hemorrhage. 2. Minimal aortic atherosclerosis. NON-VASCULAR 1. No acute process or evidence of acute hemorrhage. 2. Moderate amount of retained stool in the colon suggesting constipation. 3. Coronary artery calcifications. Electronically Signed   By: Thornell Sartorius M.D.   On: 07/12/2022 21:27     PREVIOUS ENDOSCOPIES:            none   Impression / Plan:   52 year old male with recent NSTEMI, DES placement on DAPT, admitted with worsening anemia in the setting of bloody stools (reports both black as well as maroon colored stools), mild BUN elevation and remote history of heavy NSAID use dating back 2-3 months ago.  CT-A negative for bleeding and diverticulosis.   Suspect patient is bleeding from upper GI source given history of remote NSAID use, current ASA use, no outpt PPI tx and absence of colonic abnormalities on CT.  He is on DAPT which cannot be stopped due to recent DES placement. Will plan for EGD today.  If no possible bleeding source identified, then will likely need to proceed with colonoscopy.  GI bleed, suspect PUD NPO PPI IV BID Continue DAPT for now given recent stent EGD  today  The details, risks (including bleeding, perforation, infection, missed lesions, medication reactions and possible hospitalization or surgery if complications occur), benefits, and alternatives to EGD with possible biopsy and possible dilation were discussed with the patient and he consents to proceed.    Thanks   LOS: 0 days   Jenel Lucks  07/13/2022, 8:50 AM

## 2022-07-13 NOTE — Op Note (Signed)
[facility] Patient Name: Micheal Neal Procedure Date : 07/13/2022 MRN: [id number] Attending MD: Dub Amis. Tomasa Rand , MD, [id number] Date of Birth: 1970/12/14 CSN: [id number] Age: 52 Admit Type: Inpatient Procedure:                Upper GI endoscopy Indications:              Suspected upper gastrointestinal bleeding Providers:                Lorin Picket E. Tomasa Rand, MD, Vicki Mallet, RN, Brion Aliment, Technician Referring MD:              Medicines:                Monitored Anesthesia Care Complications:            No immediate complications. Estimated Blood Loss:     Estimated blood loss was minimal. Procedure:                Pre-Anesthesia Assessment:                           - Prior to the procedure, a History and Physical                            was performed, and patient medications and                            allergies were reviewed. The patient's tolerance of                            previous anesthesia was also reviewed. The risks                            and benefits of the procedure and the sedation                            options and risks were discussed with the patient.                            All questions were answered, and informed consent                            was obtained. Prior Anticoagulants: The patient has                            taken Brilinta (ticagrelor), last dose was 1 day                            prior to procedure. ASA Grade Assessment: III - A                            patient with severe systemic disease. After  reviewing the risks and benefits, the patient was                            deemed in satisfactory condition to undergo the                            procedure.                           After obtaining informed consent, the endoscope was                            passed under direct vision. Throughout the                            procedure, the  patient's blood pressure, pulse, and                            oxygen saturations were monitored continuously. The                            [id number]-H190 ([id number]) Olympus endoscope was introduced                            through the mouth, and advanced to the third part                            of duodenum. The upper GI endoscopy was                            accomplished without difficulty. The patient                            tolerated the procedure well. Scope In: Scope Out: Findings:      The examined portions of the nasopharynx, oropharynx and larynx were       normal.      LA Grade A (one or more mucosal breaks less than 5 mm, not extending       between tops of 2 mucosal folds) esophagitis with no bleeding was found.      The entire examined stomach was normal. Biopsies were taken with a cold       forceps for Helicobacter pylori testing.      The examined duodenum was normal. Impression:               - The examined portions of the nasopharynx,                            oropharynx and larynx were normal.                           - LA Grade A reflux esophagitis with no bleeding.                            Very unlikely that this would explain Micheal Neal  anemia/bloody stools                           - Normal stomach. Biopsied.                           - Normal examined duodenum.                           - No abnormalities to explain patient's GI bleeding Moderate Sedation:      N/A Recommendation:           - Return patient to hospital ward for ongoing care.                           - Full liquid diet today. Can have regular diet                            with dinner if no concerns for active bleeding                            before then.                           - Continue present medications.                           - Await pathology results.                           - Use Protonix (pantoprazole) 40 mg PO daily.                            - Patient will need colonoscopy. Due to anesthesia                            staffing shortages, will only be able to do                            colonoscopy tomorrow if emergent/active bleeding.                           - If bleeding has subsided, will plan for                            colonoscopy on Monday Procedure Code(s):        --- Professional ---                           [phone], Esophagogastroduodenoscopy, flexible,                            transoral; with biopsy, single or multiple Diagnosis Code(s):        --- Professional ---                           K21.00, Gastro-esophageal reflux disease with  esophagitis, without bleeding CPT copyright 2022 American Medical Association. All rights reserved. The codes documented in this report are preliminary and upon coder review may  be revised to meet current compliance requirements. Azyiah Bo E. Tomasa Rand, MD 07/13/2022 9:51:22 AM This report has been signed electronically. Number of Addenda: 0

## 2022-07-13 NOTE — Progress Notes (Signed)
Micheal Neal  PPJ:[id number] DOB: [birth year] DOA: 07/12/2022 PCP: [name], NP    Brief Narrative:  52 year old with a history of DM2, chronic orthostatic hypotension on midodrine, and CAD who suffered an NSTEMI in February 2024 leading to placement of a DES with subsequent DAPT.  She he presented to the ED 07/12/2022 with complaints of dizziness on standing which have been progressively worsening over 4 to 5 days.  He was seen by his PCP and found to be anemic.  He reported black tarry stools over the last week with constipation prior to that.  In the days immediately prior to his presentation he had also experienced some maroon-colored stools.  Consultants:  None  Goals of Care:  Code Status: Full Code   DVT prophylaxis: SCDs  Interim Hx: Afebrile.  Vital signs stable at present.  Tolerated EGD without difficulty.  Continues to report lightheadedness when sitting up or standing.  No chest pain or shortness of breath  Assessment & Plan:  GI bleed Patient reports both black tarry stools and some maroon-colored stools before presentation, making it hard to guess the likely bleeding locale - CTa bleeding scan w/o obvious high grade blood loss - EGD this morning w/o obvious source of bleeding - attempt to continue DAPT given recent deployment of DES - guaiac positive this admission 6 - tentative plan for colo on Monday, unless bleeding necessitates emergent procedure before then   Acute blood loss anemia Baseline hemoglobin 12.1 approximately 1 month ago with hemoglobin 8.96 at presentation - of note patient is a [employer]'s [employer] and would not accept blood products  Recent Labs  Lab 07/11/22 1045 07/12/22 1243 07/13/22 0233 07/13/22 1019  HGB 10.7* 8.9* 8.7* 8.6*    CAD status post DES 05/20/22 Attempt to continue DAPT unless true life-threatening bleeding encountered -I have counseled the patient and his wife on this at length to include an explanation of the risk associated  with stopping DAPT as well as the risk associated with continuing it  Chronic orthostatic hypotension Continue usual midodrine  Uncontrolled DM2 with hyperglycemia Utilizes insulin at home - monitor CBG   Family Communication: Spoke with wife at bedside Disposition:  eventual return home    Objective: Blood pressure 101/63, pulse 84, temperature (!) 97.2 F (36.2 C), temperature source Temporal, resp. rate 14, SpO2 99 %.  Intake/Output Summary (Last 24 hours) at 07/13/2022 1503 Last data filed at 07/13/2022 0931 Gross per 24 hour  Intake 344.12 ml  Output --  Net 344.12 ml   There were no vitals filed for this visit.  Examination: General: No acute respiratory distress Lungs: Clear to auscultation bilaterally without wheezes or crackles Cardiovascular: Regular rate and rhythm without murmur gallop or rub normal S1 and S2 Abdomen: Nontender, nondistended, soft, bowel sounds positive, no rebound, no ascites, no appreciable mass Extremities: No significant cyanosis, clubbing, or edema bilateral lower extremities  CBC: Recent Labs  Lab 07/12/22 1243 07/13/22 0233 07/13/22 1019  WBC 6.5 6.7 5.3  NEUTROABS 4.6  --   --   HGB 8.9* 8.7* 8.6*  HCT 27.7* 26.3* 26.3*  MCV 86.6 85.4 85.1  PLT 283 265 266   Basic Metabolic Panel: Recent Labs  Lab 07/11/22 1045 07/12/22 1243 07/13/22 0233  NA 137 135 136  K 4.4 4.4 3.8  CL 102 102 102  CO2 27 23 23   GLUCOSE 118* 255* 100*  BUN 32* 29* 23*  CREATININE 1.42 1.33* 1.22  CALCIUM 9.2 8.7* 8.6*   GFR:  Estimated Creatinine Clearance: 100.9 mL/min (by C-G formula based on SCr of 1.22 mg/dL).   Scheduled Meds:  aspirin EC  81 mg Oral Daily   atorvastatin  80 mg Oral Daily   carvedilol  3.125 mg Oral BID WC   insulin aspart  0-9 Units Subcutaneous Q4H   midodrine  10 mg Oral TID WC   pantoprazole  40 mg Oral Daily   senna-docusate  1 tablet Oral QHS   ticagrelor  90 mg Oral BID   Continuous Infusions:  lactated  ringers 100 mL/hr at 07/13/22 1046     LOS: 0 days   Lonia Blood, MD [facility] Office  [phone] Pager - Text Page per Loretha Stapler  If 7PM-7AM, please contact night-coverage per Amion 07/13/2022, 3:03 PM

## 2022-07-13 NOTE — Transfer of Care (Signed)
Immediate Anesthesia Transfer of Care Note  Patient: Micheal Neal  Procedure(s) Performed: ESOPHAGOGASTRODUODENOSCOPY (EGD)  Patient Location: Endoscopy Unit  Anesthesia Type:MAC  Level of Consciousness: awake, alert , and oriented  Airway & Oxygen Therapy: Patient Spontanous Breathing and Patient connected to nasal cannula oxygen  Post-op Assessment: Report given to RN and Post -op Vital signs reviewed and stable  Post vital signs: Reviewed and stable  Last Vitals:  Vitals Value Taken Time  BP 88/41   Temp    Pulse 88   Resp 18   SpO2 98     Last Pain:  Vitals:   07/13/22 0903  TempSrc: Temporal  PainSc: 0-No pain         Complications: No notable events documented.

## 2022-07-13 NOTE — Plan of Care (Signed)
  Problem: Coping: Goal: Ability to adjust to condition or change in health will improve Outcome: Progressing   

## 2022-07-14 DIAGNOSIS — I951 Orthostatic hypotension: Secondary | ICD-10-CM | POA: Diagnosis not present

## 2022-07-14 DIAGNOSIS — D124 Benign neoplasm of descending colon: Secondary | ICD-10-CM | POA: Diagnosis not present

## 2022-07-14 DIAGNOSIS — I639 Cerebral infarction, unspecified: Secondary | ICD-10-CM | POA: Diagnosis not present

## 2022-07-14 DIAGNOSIS — Z7982 Long term (current) use of aspirin: Secondary | ICD-10-CM | POA: Diagnosis not present

## 2022-07-14 DIAGNOSIS — Z531 Procedure and treatment not carried out because of patient's decision for reasons of belief and group pressure: Secondary | ICD-10-CM | POA: Diagnosis not present

## 2022-07-14 DIAGNOSIS — K2100 Gastro-esophageal reflux disease with esophagitis, without bleeding: Secondary | ICD-10-CM | POA: Diagnosis not present

## 2022-07-14 DIAGNOSIS — Z794 Long term (current) use of insulin: Secondary | ICD-10-CM | POA: Diagnosis not present

## 2022-07-14 DIAGNOSIS — K5190 Ulcerative colitis, unspecified, without complications: Secondary | ICD-10-CM | POA: Diagnosis not present

## 2022-07-14 DIAGNOSIS — K922 Gastrointestinal hemorrhage, unspecified: Secondary | ICD-10-CM | POA: Diagnosis not present

## 2022-07-14 DIAGNOSIS — K5900 Constipation, unspecified: Secondary | ICD-10-CM | POA: Diagnosis not present

## 2022-07-14 DIAGNOSIS — D62 Acute posthemorrhagic anemia: Secondary | ICD-10-CM | POA: Diagnosis not present

## 2022-07-14 DIAGNOSIS — D123 Benign neoplasm of transverse colon: Secondary | ICD-10-CM | POA: Diagnosis not present

## 2022-07-14 DIAGNOSIS — Z803 Family history of malignant neoplasm of breast: Secondary | ICD-10-CM | POA: Diagnosis not present

## 2022-07-14 DIAGNOSIS — I252 Old myocardial infarction: Secondary | ICD-10-CM

## 2022-07-14 DIAGNOSIS — K2950 Unspecified chronic gastritis without bleeding: Secondary | ICD-10-CM | POA: Diagnosis not present

## 2022-07-14 DIAGNOSIS — K6389 Other specified diseases of intestine: Secondary | ICD-10-CM | POA: Diagnosis not present

## 2022-07-14 DIAGNOSIS — I2510 Atherosclerotic heart disease of native coronary artery without angina pectoris: Secondary | ICD-10-CM | POA: Diagnosis not present

## 2022-07-14 DIAGNOSIS — E860 Dehydration: Secondary | ICD-10-CM | POA: Diagnosis not present

## 2022-07-14 DIAGNOSIS — Z955 Presence of coronary angioplasty implant and graft: Secondary | ICD-10-CM

## 2022-07-14 DIAGNOSIS — Z7984 Long term (current) use of oral hypoglycemic drugs: Secondary | ICD-10-CM | POA: Diagnosis not present

## 2022-07-14 DIAGNOSIS — E119 Type 2 diabetes mellitus without complications: Secondary | ICD-10-CM | POA: Diagnosis not present

## 2022-07-14 DIAGNOSIS — M1990 Unspecified osteoarthritis, unspecified site: Secondary | ICD-10-CM | POA: Diagnosis not present

## 2022-07-14 DIAGNOSIS — Z79899 Other long term (current) drug therapy: Secondary | ICD-10-CM | POA: Diagnosis not present

## 2022-07-14 DIAGNOSIS — K648 Other hemorrhoids: Secondary | ICD-10-CM | POA: Diagnosis not present

## 2022-07-14 DIAGNOSIS — Z7902 Long term (current) use of antithrombotics/antiplatelets: Secondary | ICD-10-CM | POA: Diagnosis not present

## 2022-07-14 DIAGNOSIS — K921 Melena: Secondary | ICD-10-CM | POA: Diagnosis not present

## 2022-07-14 DIAGNOSIS — Z8249 Family history of ischemic heart disease and other diseases of the circulatory system: Secondary | ICD-10-CM | POA: Diagnosis not present

## 2022-07-14 DIAGNOSIS — K625 Hemorrhage of anus and rectum: Secondary | ICD-10-CM | POA: Diagnosis not present

## 2022-07-14 DIAGNOSIS — K633 Ulcer of intestine: Secondary | ICD-10-CM | POA: Diagnosis not present

## 2022-07-14 DIAGNOSIS — E1165 Type 2 diabetes mellitus with hyperglycemia: Secondary | ICD-10-CM | POA: Diagnosis not present

## 2022-07-14 LAB — CBC
HCT: 25.1 % — ABNORMAL LOW (ref 39.0–52.0)
Hemoglobin: 8 g/dL — ABNORMAL LOW (ref 13.0–17.0)
MCH: 27.8 pg (ref 26.0–34.0)
MCHC: 31.9 g/dL (ref 30.0–36.0)
MCV: 87.2 fL (ref 80.0–100.0)
Platelets: 226 10*3/uL (ref 150–400)
RBC: 2.88 MIL/uL — ABNORMAL LOW (ref 4.22–5.81)
RDW: 13.2 % (ref 11.5–15.5)
WBC: 6.7 10*3/uL (ref 4.0–10.5)
nRBC: 0 % (ref 0.0–0.2)

## 2022-07-14 LAB — BASIC METABOLIC PANEL
Anion gap: 8 (ref 5–15)
BUN: 15 mg/dL (ref 6–20)
CO2: 26 mmol/L (ref 22–32)
Calcium: 8.4 mg/dL — ABNORMAL LOW (ref 8.9–10.3)
Chloride: 103 mmol/L (ref 98–111)
Creatinine, Ser: 1.28 mg/dL — ABNORMAL HIGH (ref 0.61–1.24)
GFR, Estimated: >60 mL/min (ref >60)
Glucose, Bld: 104 mg/dL — ABNORMAL HIGH (ref 70–99)
Potassium: 3.8 mmol/L (ref 3.5–5.1)
Sodium: 137 mmol/L (ref 135–145)

## 2022-07-14 LAB — GLUCOSE, CAPILLARY
Glucose-Capillary: 115 mg/dL — ABNORMAL HIGH (ref 70–99)
Glucose-Capillary: 99 mg/dL (ref 70–99)
Glucose-Capillary: 120 mg/dL — ABNORMAL HIGH (ref 70–99)
Glucose-Capillary: 134 mg/dL — ABNORMAL HIGH (ref 70–99)

## 2022-07-14 MED ORDER — PEG-KCL-NACL-NASULF-NA ASC-C 100 G PO SOLR: 1.0000 | Freq: Once | ORAL | Status: DC

## 2022-07-14 MED ORDER — PEG-KCL-NACL-NASULF-NA ASC-C 100 G PO SOLR
0.5000 | Freq: Once | ORAL | Status: AC
  Administered 2022-07-14: 100 g via ORAL
  Filled 2022-07-14: qty 1

## 2022-07-14 MED ORDER — PEG-KCL-NACL-NASULF-NA ASC-C 100 G PO SOLR
0.5000 | Freq: Once | ORAL | Status: AC
  Administered 2022-07-14: 100 g via ORAL
  Filled 2022-07-14 (×2): qty 1

## 2022-07-14 MED ORDER — KCL IN DEXTROSE-NACL 20-5-0.9 MEQ/L-%-% IV SOLN
INTRAVENOUS | Status: DC
  Filled 2022-07-14: qty 1000

## 2022-07-14 NOTE — Progress Notes (Signed)
Micheal Neal  VEH:[id number] DOB: [birth year] DOA: 07/12/2022 PCP: [name], NP    Brief Narrative:  52 year old with a history of DM2, chronic orthostatic hypotension on midodrine, and CAD who suffered an NSTEMI in February 2024 leading to placement of a DES with subsequent DAPT.  She he presented to the ED 07/12/2022 with complaints of dizziness on standing which have been progressively worsening over 4 to 5 days.  He was seen by his PCP and found to be anemic.  He reported black tarry stools over the last week with constipation prior to that.  In the days immediately prior to his presentation he had also experienced some maroon-colored stools.  Consultants:  None  Goals of Care:  Code Status: Full Code   DVT prophylaxis: SCDs  Interim Hx: No acute events recorded overnight.  Afebrile.  Vital signs stable.  Hemoglobin has declined somewhat, but not markedly so.  Reports no bowel movement since admission.  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 -patient has been counseled that Ido hemoglobin given his history of CAD is 8.0 or better but reconfirms that he would not except a blood transfusion  Recent Labs  Lab 07/11/22 1045 07/12/22 1243 07/13/22 0233 07/13/22 1019 07/14/22 0421  HGB 10.7* 8.9* 8.7* 8.6* 8.0*     CAD status post DES 05/20/22 Attempt to continue DAPT unless true  life-threatening bleeding encountered - I have counseled the patient and his wife on this at length to include an explanation of the risk associated with stopping DAPT as well as the risk associated with continuing it  Chronic orthostatic hypotension Continue usual midodrine  Uncontrolled DM2 with hyperglycemia Utilizes insulin at home -CBG well-controlled at present   Family Communication: Spoke with wife at bedside Disposition:  eventual return home    Objective: Blood pressure 107/68, pulse 74, temperature 98.1 F (36.7 C), temperature source Oral, resp. rate 17, SpO2 98 %.  Intake/Output Summary (Last 24 hours) at 07/14/2022 0843 Last data filed at 07/14/2022 0334 Gross per 24 hour  Intake 1629.27 ml  Output 2 ml  Net 1627.27 ml    There were no vitals filed for this visit.  Examination: General: No acute respiratory distress Lungs: Clear to auscultation bilaterally without wheezes or crackles Cardiovascular: Regular rate and rhythm without murmur  Abdomen: Nontender, nondistended, soft, bowel sounds positive, no rebound Extremities: No significant edema bilateral lower extremities  CBC: Recent Labs  Lab 07/12/22 1243 07/13/22 0233 07/13/22 1019 07/14/22 0421  WBC 6.5 6.7 5.3 6.7  NEUTROABS 4.6  --   --   --   HGB 8.9* 8.7* 8.6* 8.0*  HCT 27.7* 26.3* 26.3* 25.1*  MCV 86.6 85.4 85.1 87.2  PLT 283 265 266 226    Basic Metabolic Panel: Recent Labs  Lab 07/12/22 1243 07/13/22 0233 07/14/22 0421  NA 135 136 137  K 4.4 3.8 3.8  CL 102 102 103  CO2 23 23  26  GLUCOSE 255* 100* 104*  BUN 29* 23* 15  CREATININE 1.33* 1.22 1.28*  CALCIUM 8.7* 8.6* 8.4*    GFR: Estimated Creatinine Clearance: 96.2 mL/min (A) (by C-G formula based on SCr of 1.28 mg/dL (H)).   Scheduled Meds:  aspirin EC  81 mg Oral Daily   atorvastatin  80 mg Oral Daily   carvedilol  3.125 mg Oral BID WC   insulin aspart  0-5 Units Subcutaneous QHS   insulin aspart  0-9 Units  Subcutaneous TID WC   midodrine  10 mg Oral TID WC   pantoprazole  40 mg Oral Daily   senna-docusate  1 tablet Oral QHS   ticagrelor  90 mg Oral BID   Continuous Infusions:  lactated ringers 100 mL/hr at 07/14/22 0334     LOS: 0 days   Lonia Blood, MD [facility] Office  [phone] Pager - Text Page per Loretha Stapler  If 7PM-7AM, please contact night-coverage per Amion 07/14/2022, 8:43 AM

## 2022-07-14 NOTE — Progress Notes (Signed)
[facility] ROUNDING NOTE   Subjective: Feeling well this morning.  Still no bowel movement (last one was Thursday).  Hgb drifted down slightly to 8.  BUN improving     Objective: Vital signs in last 24 hours: Temp:  [98 F (36.7 C)-98.3 F (36.8 C)] 98.1 F (36.7 C) (04/14 0818) Pulse Rate:  [72-78] 74 (04/14 0818) Resp:  [16-18] 17 (04/14 0818) BP: (101-118)/(68-81) 107/68 (04/14 0818) SpO2:  [97 %-100 %] 98 % (04/14 0818) Last BM Date : 07/12/22 General: NAD, pleasant Hispanic male, wife at bedside Lungs:  CTA b/l, no w/r/r Heart:  RRR, no m/r/g Abdomen:  Soft, NT, ND, +BS Ext:  No c/c/e    Intake/Output from previous day: 04/13 0701 - 04/14 0700 In: 1629.3 [I.V.:1629.3] Out: 2 [Urine:2] Intake/Output this shift: No intake/output data recorded.   Lab Results: Recent Labs    07/13/22 0233 07/13/22 1019 07/14/22 0421  WBC 6.7 5.3 6.7  HGB 8.7* 8.6* 8.0*  PLT 265 266 226  MCV 85.4 85.1 87.2   BMET Recent Labs    07/12/22 1243 07/13/22 0233 07/14/22 0421  NA 135 136 137  K 4.4 3.8 3.8  CL 102 102 103  CO2 23 23 26   GLUCOSE 255* 100* 104*  BUN 29* 23* 15  CREATININE 1.33* 1.22 1.28*  CALCIUM 8.7* 8.6* 8.4*   LFT Recent Labs    07/12/22 1243  PROT 7.4  ALBUMIN 3.7  AST 19  ALT 22  ALKPHOS 96  BILITOT 0.7   PT/INR Recent Labs    07/12/22 1243  INR 1.1      Imaging/Other results: CT ANGIO GI BLEED  Result Date: 07/12/2022 CLINICAL DATA:  Left lower quadrant abdominal pain, GI bleed. EXAM: CTA ABDOMEN AND PELVIS WITHOUT AND WITH CONTRAST TECHNIQUE: Multidetector CT imaging of the abdomen and pelvis was performed using the standard protocol during bolus administration of intravenous contrast. Multiplanar reconstructed images and MIPs were obtained and reviewed to evaluate the vascular anatomy. RADIATION DOSE REDUCTION: This exam was performed according to the departmental dose-optimization program which includes automated  exposure control, adjustment of the mA and/or kV according to patient size and/or use of iterative reconstruction technique. CONTRAST:  OMNIPAQUE IOHEXOL 350 MG/ML SOLN COMPARISON:  None Available. FINDINGS: VASCULAR Aorta: Normal caliber aorta without aneurysm, dissection, vasculitis or significant stenosis. Minimal aortic atherosclerosis. Celiac: Patent without evidence of aneurysm, dissection, vasculitis or significant stenosis. SMA: Patent without evidence of aneurysm, dissection, vasculitis or significant stenosis. Renals: Both renal arteries are patent without evidence of aneurysm, dissection, vasculitis, fibromuscular dysplasia or significant stenosis. IMA: Patent. Inflow: Patent without evidence of aneurysm, dissection, vasculitis or significant stenosis. Proximal Outflow: Bilateral common femoral and visualized portions of the superficial and profunda femoral arteries are patent without evidence of aneurysm, dissection, vasculitis or significant stenosis. Veins: No obvious venous abnormality. Review of the MIP images confirms the above findings. NON-VASCULAR Lower chest: The heart is normal in size and scattered coronary artery calcifications are noted. Minimal atelectasis is present at the lung bases. Hepatobiliary: No focal liver abnormality is seen. No gallstones, gallbladder wall thickening, or biliary dilatation. Pancreas: Unremarkable. No pancreatic ductal dilatation or surrounding inflammatory changes. Spleen: Normal in size without focal abnormality. Adrenals/Urinary Tract: The adrenal glands are within normal limits. The kidneys enhance symmetrically. No renal calculus or hydronephrosis. The bladder is unremarkable. Stomach/Bowel: Stomach is within normal limits. Appendix appears normal. No evidence of bowel wall thickening, distention, or inflammatory changes. No free air or pneumatosis. A moderate  amount of retained stool is present in the colon. No acute hemorrhage is seen. Lymphatic: No  abdominal or pelvic lymphadenopathy. Reproductive: Prostate is unremarkable. Other: No abdominopelvic ascites. There is a small fat containing umbilical hernia. Musculoskeletal: No acute osseous abnormality. Mild degenerative changes are present in the thoracolumbar spine. IMPRESSION: VASCULAR 1. No evidence of acute hemorrhage. 2. Minimal aortic atherosclerosis. NON-VASCULAR 1. No acute process or evidence of acute hemorrhage. 2. Moderate amount of retained stool in the colon suggesting constipation. 3. Coronary artery calcifications. Electronically Signed   By: Laura  Taylor M.D.   On: 07/12/2022 21:27      Assessment and Plan:  51 year old male with recent NSTEMI (Feb 2024), DES placement on DAPT, admitted with worsening anemia (baseline 12-13, 9 on admission) in the setting of recent bloody stools (reports both black as well as maroon colored stools), mild BUN elevation and remote history of heavy NSAID use dating back 2-3 months ago (none since Feb).  CT-A negative for bleeding and diverticulosis.  Suspected to have upper GI bleeding source, but EGD 4/13 was normal except for very mild reflux esophagitis.  As his hemoglobin dropped from 13 to 8, it is very unlikely that this was the source of bleeding. Therefore will proceed with colonoscopy tomorrow (unable to do today because of anesthesia limitations) Patient is Jehovah's witness and declines blood transfusion.  With his CAD, goal hgb should be at least 8.  Patient is aware of this and still declines blood, although he would be amenable to IV iron   GI bleed with acute blood loss anemia.  No overt bleeding since 4/11 - EGD negative for bleeding source - Colonoscopy planned for tomorrow (4/15) - Continue DAPT for now given recent stent and non-life threatening bleed. - Patient declines blood transfusion due to religious reasons, but would accept IV iron - Patient may need extra bowel prep, given no BM in 3 days.  Patient advised that if his  stools are still brown after completing bowel prep, he may need to take additional prep.  GERD with reflux esophagitis, not on outpatient medications - Recommend patient take Protonix 40 mg PO daily   CAD s/p NSTEMI/DES Feb 2024 - Continue DAPT  Chronic orthostatic hypotension, on midodrine as outpatient - Continue midodrine    Jennalynn Rivard E Allysia Ingles, MD  07/14/2022, 11:57 AM [facility]  

## 2022-07-14 NOTE — Plan of Care (Signed)
  Problem: Coping: Goal: Ability to adjust to condition or change in health will improve Outcome: Progressing   

## 2022-07-14 NOTE — H&P (View-Only) (Signed)
[facility] ROUNDING NOTE   Subjective: Feeling well this morning.  Still no bowel movement (last one was Thursday).  Hgb drifted down slightly to 8.  BUN improving     Objective: Vital signs in last 24 hours: Temp:  [98 F (36.7 C)-98.3 F (36.8 C)] 98.1 F (36.7 C) (04/14 0818) Pulse Rate:  [72-78] 74 (04/14 0818) Resp:  [16-18] 17 (04/14 0818) BP: (101-118)/(68-81) 107/68 (04/14 0818) SpO2:  [97 %-100 %] 98 % (04/14 0818) Last BM Date : 07/12/22 General: NAD, pleasant Hispanic male, wife at bedside Lungs:  CTA b/l, no w/r/r Heart:  RRR, no m/r/g Abdomen:  Soft, NT, ND, +BS Ext:  No c/c/e    Intake/Output from previous day: 04/13 0701 - 04/14 0700 In: 1629.3 [I.V.:1629.3] Out: 2 [Urine:2] Intake/Output this shift: No intake/output data recorded.   Lab Results: Recent Labs    07/13/22 0233 07/13/22 1019 07/14/22 0421  WBC 6.7 5.3 6.7  HGB 8.7* 8.6* 8.0*  PLT 265 266 226  MCV 85.4 85.1 87.2   BMET Recent Labs    07/12/22 1243 07/13/22 0233 07/14/22 0421  NA 135 136 137  K 4.4 3.8 3.8  CL 102 102 103  CO2 23 23 26   GLUCOSE 255* 100* 104*  BUN 29* 23* 15  CREATININE 1.33* 1.22 1.28*  CALCIUM 8.7* 8.6* 8.4*   LFT Recent Labs    07/12/22 1243  PROT 7.4  ALBUMIN 3.7  AST 19  ALT 22  ALKPHOS 96  BILITOT 0.7   PT/INR Recent Labs    07/12/22 1243  INR 1.1      Imaging/Other results: CT ANGIO GI BLEED  Result Date: 07/12/2022 CLINICAL DATA:  Left lower quadrant abdominal pain, GI bleed. EXAM: CTA ABDOMEN AND PELVIS WITHOUT AND WITH CONTRAST TECHNIQUE: Multidetector CT imaging of the abdomen and pelvis was performed using the standard protocol during bolus administration of intravenous contrast. Multiplanar reconstructed images and MIPs were obtained and reviewed to evaluate the vascular anatomy. RADIATION DOSE REDUCTION: This exam was performed according to the departmental dose-optimization program which includes automated  exposure control, adjustment of the mA and/or kV according to patient size and/or use of iterative reconstruction technique. CONTRAST:  OMNIPAQUE IOHEXOL 350 MG/ML SOLN COMPARISON:  None Available. FINDINGS: VASCULAR Aorta: Normal caliber aorta without aneurysm, dissection, vasculitis or significant stenosis. Minimal aortic atherosclerosis. Celiac: Patent without evidence of aneurysm, dissection, vasculitis or significant stenosis. SMA: Patent without evidence of aneurysm, dissection, vasculitis or significant stenosis. Renals: Both renal arteries are patent without evidence of aneurysm, dissection, vasculitis, fibromuscular dysplasia or significant stenosis. IMA: Patent. Inflow: Patent without evidence of aneurysm, dissection, vasculitis or significant stenosis. Proximal Outflow: Bilateral common femoral and visualized portions of the superficial and profunda femoral arteries are patent without evidence of aneurysm, dissection, vasculitis or significant stenosis. Veins: No obvious venous abnormality. Review of the MIP images confirms the above findings. NON-VASCULAR Lower chest: The heart is normal in size and scattered coronary artery calcifications are noted. Minimal atelectasis is present at the lung bases. Hepatobiliary: No focal liver abnormality is seen. No gallstones, gallbladder wall thickening, or biliary dilatation. Pancreas: Unremarkable. No pancreatic ductal dilatation or surrounding inflammatory changes. Spleen: Normal in size without focal abnormality. Adrenals/Urinary Tract: The adrenal glands are within normal limits. The kidneys enhance symmetrically. No renal calculus or hydronephrosis. The bladder is unremarkable. Stomach/Bowel: Stomach is within normal limits. Appendix appears normal. No evidence of bowel wall thickening, distention, or inflammatory changes. No free air or pneumatosis. A moderate  amount of retained stool is present in the colon. No acute hemorrhage is seen. Lymphatic: No  abdominal or pelvic lymphadenopathy. Reproductive: Prostate is unremarkable. Other: No abdominopelvic ascites. There is a small fat containing umbilical hernia. Musculoskeletal: No acute osseous abnormality. Mild degenerative changes are present in the thoracolumbar spine. IMPRESSION: VASCULAR 1. No evidence of acute hemorrhage. 2. Minimal aortic atherosclerosis. NON-VASCULAR 1. No acute process or evidence of acute hemorrhage. 2. Moderate amount of retained stool in the colon suggesting constipation. 3. Coronary artery calcifications. Electronically Signed   By: Thornell Sartorius M.D.   On: 07/12/2022 21:27      Assessment and Plan:  52 year old male with recent NSTEMI (Feb 2024), DES placement on DAPT, admitted with worsening anemia (baseline 12-13, 9 on admission) in the setting of recent bloody stools (reports both black as well as maroon colored stools), mild BUN elevation and remote history of heavy NSAID use dating back 2-3 months ago (none since Feb).  CT-A negative for bleeding and diverticulosis.  Suspected to have upper GI bleeding source, but EGD 4/13 was normal except for very mild reflux esophagitis.  As his hemoglobin dropped from 13 to 8, it is very unlikely that this was the source of bleeding. Therefore will proceed with colonoscopy tomorrow (unable to do today because of anesthesia limitations) Patient is Jehovah's witness and declines blood transfusion.  With his CAD, goal hgb should be at least 8.  Patient is aware of this and still declines blood, although he would be amenable to IV iron   GI bleed with acute blood loss anemia.  No overt bleeding since 4/11 - EGD negative for bleeding source - Colonoscopy planned for tomorrow (4/15) - Continue DAPT for now given recent stent and non-life threatening bleed. - Patient declines blood transfusion due to religious reasons, but would accept IV iron - Patient may need extra bowel prep, given no BM in 3 days.  Patient advised that if his  stools are still brown after completing bowel prep, he may need to take additional prep.  GERD with reflux esophagitis, not on outpatient medications - Recommend patient take Protonix 40 mg PO daily   CAD s/p NSTEMI/DES Feb 2024 - Continue DAPT  Chronic orthostatic hypotension, on midodrine as outpatient - Continue midodrine    Jenel Lucks, MD  07/14/2022, 11:57 AM [facility]

## 2022-07-15 ENCOUNTER — Inpatient Hospital Stay (HOSPITAL_COMMUNITY): Payer: 59 | Admitting: Anesthesiology

## 2022-07-15 ENCOUNTER — Encounter (HOSPITAL_COMMUNITY)
Admission: EM | Disposition: A | Discharge status: Home / Self Care | Payer: Self-pay | Source: Home / Self Care | Attending: Internal Medicine

## 2022-07-15 ENCOUNTER — Encounter (HOSPITAL_COMMUNITY): Payer: Self-pay | Admitting: Internal Medicine

## 2022-07-15 ENCOUNTER — Ambulatory Visit: Payer: 59 | Admitting: Cardiovascular Disease

## 2022-07-15 ENCOUNTER — Institutional Professional Consult (permissible substitution): Payer: 59 | Admitting: Neurology

## 2022-07-15 DIAGNOSIS — D123 Benign neoplasm of transverse colon: Secondary | ICD-10-CM

## 2022-07-15 DIAGNOSIS — K6389 Other specified diseases of intestine: Secondary | ICD-10-CM | POA: Diagnosis not present

## 2022-07-15 DIAGNOSIS — D120 Benign neoplasm of cecum: Secondary | ICD-10-CM

## 2022-07-15 DIAGNOSIS — K5190 Ulcerative colitis, unspecified, without complications: Secondary | ICD-10-CM | POA: Diagnosis not present

## 2022-07-15 DIAGNOSIS — K648 Other hemorrhoids: Secondary | ICD-10-CM | POA: Diagnosis not present

## 2022-07-15 DIAGNOSIS — D124 Benign neoplasm of descending colon: Secondary | ICD-10-CM

## 2022-07-15 DIAGNOSIS — I2510 Atherosclerotic heart disease of native coronary artery without angina pectoris: Secondary | ICD-10-CM

## 2022-07-15 DIAGNOSIS — K922 Gastrointestinal hemorrhage, unspecified: Secondary | ICD-10-CM | POA: Diagnosis not present

## 2022-07-15 DIAGNOSIS — I252 Old myocardial infarction: Secondary | ICD-10-CM | POA: Diagnosis not present

## 2022-07-15 DIAGNOSIS — D62 Acute posthemorrhagic anemia: Secondary | ICD-10-CM | POA: Diagnosis not present

## 2022-07-15 DIAGNOSIS — K625 Hemorrhage of anus and rectum: Secondary | ICD-10-CM

## 2022-07-15 HISTORY — PX: BIOPSY: SHX5522

## 2022-07-15 HISTORY — PX: COLONOSCOPY: SHX5424

## 2022-07-15 LAB — CBC
HCT: 24.4 % — ABNORMAL LOW (ref 39.0–52.0)
Hemoglobin: 8.1 g/dL — ABNORMAL LOW (ref 13.0–17.0)
MCH: 28.1 pg (ref 26.0–34.0)
MCHC: 33.2 g/dL (ref 30.0–36.0)
MCV: 84.7 fL (ref 80.0–100.0)
Platelets: 254 10*3/uL (ref 150–400)
RBC: 2.88 MIL/uL — ABNORMAL LOW (ref 4.22–5.81)
RDW: 13.2 % (ref 11.5–15.5)
WBC: 6.6 10*3/uL (ref 4.0–10.5)
nRBC: 0 % (ref 0.0–0.2)

## 2022-07-15 LAB — RENAL FUNCTION PANEL
Albumin: 3.3 g/dL — ABNORMAL LOW (ref 3.5–5.0)
Anion gap: 11 (ref 5–15)
BUN: 14 mg/dL (ref 6–20)
CO2: 25 mmol/L (ref 22–32)
Calcium: 8.6 mg/dL — ABNORMAL LOW (ref 8.9–10.3)
Chloride: 103 mmol/L (ref 98–111)
Creatinine, Ser: 1.21 mg/dL (ref 0.61–1.24)
GFR, Estimated: >60 mL/min (ref >60)
Glucose, Bld: 139 mg/dL — ABNORMAL HIGH (ref 70–99)
Phosphorus: 3.9 mg/dL (ref 2.5–4.6)
Potassium: 3.7 mmol/L (ref 3.5–5.1)
Sodium: 139 mmol/L (ref 135–145)

## 2022-07-15 LAB — GLUCOSE, CAPILLARY
Glucose-Capillary: 128 mg/dL — ABNORMAL HIGH (ref 70–99)
Glucose-Capillary: 145 mg/dL — ABNORMAL HIGH (ref 70–99)
Glucose-Capillary: 143 mg/dL — ABNORMAL HIGH (ref 70–99)
Glucose-Capillary: 167 mg/dL — ABNORMAL HIGH (ref 70–99)

## 2022-07-15 SURGERY — COLONOSCOPY WITH PROPOFOL
Anesthesia: Monitor Anesthesia Care

## 2022-07-15 SURGERY — COLONOSCOPY
Anesthesia: Monitor Anesthesia Care

## 2022-07-15 MED ORDER — FENTANYL CITRATE (PF) 100 MCG/2ML IJ SOLN: 25.0000 ug | INTRAMUSCULAR | Status: DC | PRN

## 2022-07-15 MED ORDER — PHENYLEPHRINE 80 MCG/ML (10ML) SYRINGE FOR IV PUSH (FOR BLOOD PRESSURE SUPPORT)
PREFILLED_SYRINGE | INTRAVENOUS | Status: DC | PRN
  Administered 2022-07-15: 160 ug via INTRAVENOUS

## 2022-07-15 MED ORDER — OXYCODONE HCL 5 MG PO TABS: 5.0000 mg | ORAL_TABLET | Freq: Once | ORAL | Status: DC | PRN

## 2022-07-15 MED ORDER — SODIUM CHLORIDE 0.9 % IV SOLN: INTRAVENOUS | Status: DC

## 2022-07-15 MED ORDER — PROMETHAZINE HCL 25 MG/ML IJ SOLN: 6.2500 mg | INTRAMUSCULAR | Status: DC | PRN

## 2022-07-15 MED ORDER — HYDROMORPHONE HCL 1 MG/ML IJ SOLN: 0.2500 mg | INTRAMUSCULAR | Status: DC | PRN

## 2022-07-15 MED ORDER — DEXMEDETOMIDINE HCL IN NACL 80 MCG/20ML IV SOLN
INTRAVENOUS | Status: DC | PRN
  Administered 2022-07-15: 8 ug via BUCCAL
  Administered 2022-07-15: 4 ug via BUCCAL
  Administered 2022-07-15: 8 ug via BUCCAL

## 2022-07-15 MED ORDER — PROPOFOL 10 MG/ML IV BOLUS
INTRAVENOUS | Status: DC | PRN
  Administered 2022-07-15: 20 mg via INTRAVENOUS

## 2022-07-15 MED ORDER — OXYCODONE HCL 5 MG/5ML PO SOLN: 5.0000 mg | Freq: Once | ORAL | Status: DC | PRN

## 2022-07-15 MED ORDER — PROPOFOL 500 MG/50ML IV EMUL
INTRAVENOUS | Status: DC | PRN
  Administered 2022-07-15 (×2): 150 ug/kg/min via INTRAVENOUS

## 2022-07-15 MED ORDER — ONDANSETRON HCL 4 MG/2ML IJ SOLN: 4.0000 mg | Freq: Four times a day (QID) | INTRAMUSCULAR | Status: DC | PRN

## 2022-07-15 MED ORDER — LACTATED RINGERS IV SOLN: INTRAVENOUS | Status: DC | PRN

## 2022-07-15 NOTE — Progress Notes (Signed)
Patient arrived back on unit. A&O x4 denies pain at this time.

## 2022-07-15 NOTE — Progress Notes (Deleted)
No chief complaint on file.  History of Present Illness: 52 yo male with history of CAD, SVT, diabetes, hyperlipidemia, vertigo and hypotension who is here today for follow up. I saw him as a new consult on October 2023 for the evaluation of near syncope. He was seen in the ED at [facility] 11/18/21 with near syncope.  EKG without ischemic changes. BP was initially in the 70s systolic. Troponin negative. He was orthostatic in the ED and was given IV fluids. Nuclear stress test  11/27/21 with no ischemia. Normal LV function. When I met him he told me that he he continued to have episodes of dizziness. This occurs with standing but also occurs when lying down when he moves his head quickly. Echo November 2023 with normal LV function and no valve disease. Cardiac monitor 02/20/22 with sinus, 6 short runs of SVT, Rare PACs, rare PVCs. He was admitted to [facility] in February 2024 with chest pain. Cardiac cath 05/20/22 with severe right posterolateral artery stenosis treated with a drug eluting stent. Severe stenosis in the small non-dominant Circumflex which was felt to be too small for PCI. Moderate proximal LAD stenosis. Echo 05/21/22 with normal LV function and no valve disease. He was seen in our office in follow up   He is here today for follow up. The patient denies any chest pain, dyspnea, palpitations, lower extremity edema, orthopnea, PND, dizziness, near syncope or syncope.   Primary Care Physician: [name], NP   Past Medical History:  Diagnosis Date   Abnormal EKG    Anemia 07/11/2022   Contusion of left foot    Diabetes mellitus without complication    Dizziness    Encounter for hepatitis C screening test for low risk patient 07/11/2022   GERD (gastroesophageal reflux disease)    Near syncope    Palpitations    Vertigo     Past Surgical History:  Procedure Laterality Date   CORONARY STENT INTERVENTION N/A 05/20/2022   Procedure: CORONARY STENT INTERVENTION;  Surgeon:  Runell Gess, MD;  Location: [facility] INVASIVE CV LAB;  Service: Cardiovascular;  Laterality: N/A;   LEFT HEART CATH AND CORONARY ANGIOGRAPHY N/A 05/20/2022   Procedure: LEFT HEART CATH AND CORONARY ANGIOGRAPHY;  Surgeon: Runell Gess, MD;  Location: [facility] INVASIVE CV LAB;  Service: Cardiovascular;  Laterality: N/A;   NO PAST SURGERIES     No surgeries  No current facility-administered medications for this visit.   No current outpatient medications on file.   Facility-Administered Medications Ordered in Other Visits  Medication Dose Route Frequency Provider Last Rate Last Admin   [MAR Hold] acetaminophen (TYLENOL) tablet 650 mg  650 mg Oral Q6H PRN Lonia Blood, MD       [MAR Hold] aspirin EC tablet 81 mg  81 mg Oral Daily Lyda Perone M, DO   81 mg at 07/14/22 0840   [MAR Hold] atorvastatin (LIPITOR) tablet 80 mg  80 mg Oral Daily Hillary Bow, DO   80 mg at 07/14/22 0840   [MAR Hold] carvedilol (COREG) tablet 3.125 mg  3.125 mg Oral BID WC Lyda Perone M, DO   3.125 mg at 07/14/22 1711   dextrose 5 % and 0.9 % NaCl with KCl 20 mEq/L infusion   Intravenous Continuous Lonia Blood, MD 60 mL/hr at 07/15/22 0013 New Bag at 07/15/22 0013   [MAR Hold] insulin aspart (novoLOG) injection 0-5 Units  0-5 Units Subcutaneous QHS Lonia Blood, MD       [  MAR Hold] insulin aspart (novoLOG) injection 0-9 Units  0-9 Units Subcutaneous TID WC Lonia Blood, MD       [MAR Hold] midodrine (PROAMATINE) tablet 10 mg  10 mg Oral TID WC Hillary Bow, DO   10 mg at 07/14/22 1712   [MAR Hold] ondansetron (ZOFRAN) tablet 4 mg  4 mg Oral Q6H PRN Hillary Bow, DO       Or   [MAR Hold] ondansetron [facility]) injection 4 mg  4 mg Intravenous Q6H PRN Hillary Bow, DO       [MAR Hold] pantoprazole (PROTONIX) EC tablet 40 mg  40 mg Oral Daily Jetty Duhamel T, MD   40 mg at 07/14/22 0839   [MAR Hold] senna-docusate (Senokot-S) tablet 1 tablet  1 tablet Oral QHS Hillary Bow,  DO   1 tablet at 07/13/22 2152   [MAR Hold] ticagrelor (BRILINTA) tablet 90 mg  90 mg Oral BID Hillary Bow, DO   90 mg at 07/14/22 2226    No Known Allergies  Social History   Socioeconomic History   Marital status: Married    Spouse name: Not on file   Number of children: 4   Years of education: Not on file   Highest education level: Not on file  Occupational History   Occupation: Maintenance Tech  Tobacco Use   Smoking status: Never   Smokeless tobacco: Never  Vaping Use   Vaping Use: Never used  Substance and Sexual Activity   Alcohol use: No   Drug use: No   Sexual activity: Yes    Partners: Female    Birth control/protection: None  Other Topics Concern   Not on file  Social History Narrative   Not on file   Social Determinants of Health   Financial Resource Strain: Not on file  Food Insecurity: No Food Insecurity (07/12/2022)   Hunger Vital Sign    Worried About Running Out of Food in the Last Year: Never true    Ran Out of Food in the Last Year: Never true  Transportation Needs: No Transportation Needs (07/12/2022)   PRAPARE - Administrator, Civil Service (Medical): No    Lack of Transportation (Non-Medical): No  Physical Activity: Not on file  Stress: Not on file  Social Connections: Unknown (07/11/2022)   Social Connection and Isolation Panel [NHANES]    Frequency of Communication with Friends and Family: Patient declined    Frequency of Social Gatherings with Friends and Family: Patient declined    Attends Religious Services: Patient declined    Database administrator or Organizations: Patient declined    Attends Banker Meetings: Not on file    Marital Status: Patient declined  Intimate Partner Violence: Not At Risk (07/12/2022)   Humiliation, Afraid, Rape, and Kick questionnaire    Fear of Current or Ex-Partner: No    Emotionally Abused: No    Physically Abused: No    Sexually Abused: No    Family History  Problem  Relation Age of Onset   Heart disease Mother    Breast cancer Maternal Grandmother        diagnosed late 50's   Breast cancer Maternal Aunt     Review of Systems:  As stated in the HPI and otherwise negative.   There were no vitals taken for this visit.  Physical Examination: General: Well developed, well nourished, NAD  HEENT: OP clear, mucus membranes moist  SKIN: warm, dry. No rashes.  Neuro: No focal deficits  Musculoskeletal: Muscle strength 5/5 all ext  Psychiatric: Mood and affect normal  Neck: No JVD, no carotid bruits, no thyromegaly, no lymphadenopathy.  Lungs:Clear bilaterally, no wheezes, rhonci, crackles Cardiovascular: Regular rate and rhythm. No murmurs, gallops or rubs. Abdomen:Soft. Bowel sounds present. Non-tender.  Extremities: No lower extremity edema. Pulses are 2 + in the bilateral DP/PT.  EKG:  EKG is ordered today. The ekg ordered today demonstrates NSR, Non-specific T wave abn  Echo 05/21/22:  1. Left ventricular ejection fraction, by estimation, is 60 to 65%. The  left ventricle has normal function. The left ventricle has no regional  wall motion abnormalities. There is mild asymmetric left ventricular  hypertrophy of the septal segment. Left  ventricular diastolic parameters are consistent with Grade I diastolic  dysfunction (impaired relaxation).   2. Right ventricular systolic function is normal. The right ventricular  size is normal.   3. The mitral valve was not well visualized. No evidence of mitral valve  regurgitation. No evidence of mitral stenosis.   4. The aortic valve is tricuspid. Aortic valve regurgitation is not  visualized. No aortic stenosis is present.   5. The inferior vena cava is normal in size with greater than 50%  respiratory variability, suggesting right atrial pressure of 3 mmHg.   Cardiac cath 05/20/22:   Prox LAD lesion is 60% stenosed.   1st Mrg lesion is 50% stenosed.   Prox Cx to Mid Cx lesion is 95% stenosed.   Mid  Cx to Dist Cx lesion is 90% stenosed.   Prox RCA lesion is 50% stenosed.   RPAV lesion is 95% stenosed.   RPDA lesion is 70% stenosed.   A drug-eluting stent was successfully placed using a SYNERGY XD [id number].   Post intervention, there is a 0% residual stenosis.   The left ventricular systolic function is normal.   LV end diastolic pressure is normal.   The left ventricular ejection fraction is 55-65% by visual estimate.  Recent Labs: 04/25/2022: TSH 1.86 05/21/2022: Magnesium 2.0 07/12/2022: ALT 22 07/15/2022: BUN 14; Creatinine, Ser 1.21; Hemoglobin 8.1; Platelets 254; Potassium 3.7; Sodium 139   Lipid Panel    Component Value Date/Time   CHOL 241 (H) 05/21/2022 0236   CHOL 145 02/26/2022 0717   TRIG 455 (H) 05/21/2022 0236   HDL 31 (L) 05/21/2022 0236   HDL 31 (L) 02/26/2022 0717   CHOLHDL 7.8 05/21/2022 0236   VLDL UNABLE TO CALCULATE IF TRIGLYCERIDE OVER 400 mg/dL 19/62/2297 9892   LDLCALC UNABLE TO CALCULATE IF TRIGLYCERIDE OVER 400 mg/dL 11/94/1740 8144   LDLCALC 72 02/26/2022 0717   LDLDIRECT 121 (H) 05/21/2022 0236     Wt Readings from Last 3 Encounters:  07/11/22 120.3 kg  06/06/22 121.2 kg  06/04/22 122.5 kg    Assessment and Plan:   CAD without angina: He is doing well following PCI of the right PLA branch in February 2024. No chest pain suggestive of angina: Will continue DAPT with ASA and Brilinta for one year post stent placement. Continue beta blocker and statin.   Dizziness: ***  SVT: No palpitations.   Labs/ tests ordered today include:   No orders of the defined types were placed in this encounter.  Disposition:   F/U with me in ***   Signed, Verne Carrow, MD, [facility] 07/15/2022 8:17 AM    [facility] [address] [place], [place], [place]  [address] Phone: [phone]; Fax: [phone]

## 2022-07-15 NOTE — Transfer of Care (Signed)
Immediate Anesthesia Transfer of Care Note  Patient: Micheal Neal  Procedure(s) Performed: COLONOSCOPY BIOPSY  Patient Location: PACU  Anesthesia Type:MAC  Level of Consciousness: awake  Airway & Oxygen Therapy: Patient Spontanous Breathing and Patient connected to face mask oxygen  Post-op Assessment: Report given to RN and Post -op Vital signs reviewed and stable  Post vital signs: Reviewed and stable  Last Vitals:  Vitals Value Taken Time  BP 92/59 07/15/22 0926  Temp    Pulse 75 07/15/22 0928  Resp 17 07/15/22 0928  SpO2 100 % 07/15/22 0928  Vitals shown include unvalidated device data.  Last Pain:  Vitals:   07/15/22 0731  TempSrc: Temporal  PainSc: 0-No pain         Complications: No notable events documented.

## 2022-07-15 NOTE — Op Note (Signed)
[facility] Patient Name: Micheal Neal Procedure Date : 07/15/2022 MRN: [id number] Attending MD: Beverley Fiedler , MD, [id number] Date of Birth: 1970/04/30 CSN: [id number] Age: 52 Admit Type: Inpatient Procedure:                Colonoscopy Indications:              Rectal bleeding, Acute post hemorrhagic anemia,                            recent NSTEMI with DES placement Feb 2024 on                            Brilinta and ASA (uninterrupted) Providers:                Carie Caddy. Rhea Belton, MD, Geralyn Corwin, RN, Rozetta Nunnery, Technician Referring MD:             [facility] Medicines:                Monitored Anesthesia Care Complications:            No immediate complications. Estimated Blood Loss:     Estimated blood loss was minimal. Procedure:                Pre-Anesthesia Assessment:                           - Prior to the procedure, a History and Physical                            was performed, and patient medications and                            allergies were reviewed. The patient's tolerance of                            previous anesthesia was also reviewed. The risks                            and benefits of the procedure and the sedation                            options and risks were discussed with the patient.                            All questions were answered, and informed consent                            was obtained. Prior Anticoagulants: The patient has                            taken Brilinta (ticagrelor), last dose was 1 day  prior to procedure. ASA Grade Assessment: III - A                            patient with severe systemic disease. After                            reviewing the risks and benefits, the patient was                            deemed in satisfactory condition to undergo the                            procedure.                           After obtaining  informed consent, the colonoscope                            was passed under direct vision. Throughout the                            procedure, the patient's blood pressure, pulse, and                            oxygen saturations were monitored continuously. The                            [id number] ([id number]) Olympus coloscope was                            introduced through the anus and advanced to the 1                            cm into the ileum. The colonoscopy was performed                            without difficulty. The patient tolerated the                            procedure well. The quality of the bowel                            preparation was good. The terminal ileum, ileocecal                            valve, appendiceal orifice, and rectum were                            photographed. Scope In: 8:58:07 AM Scope Out: 9:19:31 AM Scope Withdrawal Time: 0 hours 14 minutes 34 seconds  Total Procedure Duration: 0 hours 21 minutes 24 seconds  Findings:      The digital rectal exam was normal.      An area of abnormal mucosa at the ileocecal valve characterized by  nodularity and superficial ulceration. The mucosa here was firm and       friable. Biopsies were taken with a cold forceps for histology. This was       circumferential about the valve but did not extend proximal to any       significant extent. Query inflammatory, neoplasia versus ischemia       (though not a common area for ischemia; if inflammatory it appear to be       focal)      Apart of the abnormal mucosa at the valve the examined terminal ileum       (very short distance as the scope could not be deeply advanced into the       ileum due to looping) appeared normal.      Two sessile polyps were found in the descending colon and transverse       colon. The polyps were small in size. Polypectomy was not attempted due       to the patient taking anticoagulation medication.      Internal hemorrhoids  were found during retroflexion. The hemorrhoids       were small. Impression:               - Nodular and abnormal mucosa at IC valve. Biopsied                            (rule out neoplasia).                           - The examined portion of the terminal ileum was                            normal (1-2 cm examined).                           - Two small polyps in the descending colon and in                            the transverse colon. Resection not attempted.                            These can be addressed/removed once Brilinta can be                            held (after Feb 2025).                           - Diminutive internal hemorrhoids. Moderate Sedation:      N/A Recommendation:           - Return patient to hospital ward for ongoing care.                           - Advance diet as tolerated.                           - Continue present medications.                           -  Await pathology results.                           - Repeat colonoscopy Feb or March 2025 (once                            antiplatelet therapy can be interrupted). Procedure Code(s):        --- Professional ---                           [phone], Colonoscopy, flexible; with biopsy, single                            or multiple Diagnosis Code(s):        --- Professional ---                           K63.89, Other specified diseases of intestine                           K64.8, Other hemorrhoids                           D12.4, Benign neoplasm of descending colon                           D12.3, Benign neoplasm of transverse colon (hepatic                            flexure or splenic flexure)                           K62.5, Hemorrhage of anus and rectum                           D62, Acute posthemorrhagic anemia CPT copyright 2022 American Medical Association. All rights reserved. The codes documented in this report are preliminary and upon coder review may  be revised to meet current compliance  requirements. Beverley Fiedler, MD 07/15/2022 9:44:53 AM This report has been signed electronically. Number of Addenda: 0

## 2022-07-15 NOTE — Anesthesia Postprocedure Evaluation (Signed)
Anesthesia Post Note  Patient: Micheal Neal  Procedure(s) Performed: COLONOSCOPY BIOPSY     Patient location during evaluation: PACU Anesthesia Type: MAC Level of consciousness: awake and alert Pain management: pain level controlled Vital Signs Assessment: post-procedure vital signs reviewed and stable Respiratory status: spontaneous breathing, nonlabored ventilation and respiratory function stable Cardiovascular status: blood pressure returned to baseline and stable Postop Assessment: no apparent nausea or vomiting Anesthetic complications: no   No notable events documented.  Last Vitals:  Vitals:   07/15/22 0945 07/15/22 1006  BP: 99/67 116/83  Pulse: 78 74  Resp: 13 16  Temp: (!) 36.3 C   SpO2: 98% 100%    Last Pain:  Vitals:   07/15/22 0930  TempSrc:   PainSc: Asleep                 Lowella Curb

## 2022-07-15 NOTE — Anesthesia Preprocedure Evaluation (Signed)
Anesthesia Evaluation  Patient identified by MRN, date of birth, ID band Patient awake    Reviewed: Allergy & Precautions, H&P , NPO status , Patient's Chart, lab work & pertinent test results  Airway Mallampati: II  TM Distance: >3 FB Neck ROM: full    Dental no notable dental hx.    Pulmonary neg pulmonary ROS   Pulmonary exam normal breath sounds clear to auscultation       Cardiovascular + CAD, + Past MI and + Cardiac Stents  Normal cardiovascular exam Rhythm:regular Rate:Normal  TTE (05/21/22): EF 60-65%  Cath (05/20/22): stent placement.  Pt is on brillinta   Neuro/Psych    GI/Hepatic ,GERD  ,,GI bleeding   Endo/Other  diabetes, Type 2    Renal/GU      Musculoskeletal  (+) Arthritis , Osteoarthritis,    Abdominal   Peds  Hematology  (+) Blood dyscrasia, anemia Hemoglobin 8.7 (07/13/22)   Anesthesia Other Findings   Reproductive/Obstetrics                             Anesthesia Physical Anesthesia Plan  ASA: 3  Anesthesia Plan: MAC   Post-op Pain Management: Minimal or no pain anticipated   Induction: Intravenous  PONV Risk Score and Plan: 1 and Propofol infusion and Treatment may vary due to age or medical condition  Airway Management Planned: Simple Face Mask  Additional Equipment:   Intra-op Plan:   Post-operative Plan:   Informed Consent: I have reviewed the patients History and Physical, chart, labs and discussed the procedure including the risks, benefits and alternatives for the proposed anesthesia with the patient or authorized representative who has indicated his/her understanding and acceptance.     Dental advisory given  Plan Discussed with: CRNA, Anesthesiologist and Surgeon  Anesthesia Plan Comments:        Anesthesia Quick Evaluation

## 2022-07-15 NOTE — Progress Notes (Signed)
Micheal Neal  ZOX:[id number] DOB: 19-Oct-1970 DOA: 07/12/2022 PCP: [name], NP    Brief Narrative:  52 year old with a history of DM2, chronic orthostatic hypotension on midodrine, and CAD who suffered a NSTEMI in February 2024 leading to placement of a DES with subsequent DAPT.  She he presented to the ED 07/12/2022 with complaints of dizziness on standing which have been progressively worsening over 4 to 5 days.  He was seen by his PCP and found to be anemic.  He reported black tarry stools over the last week with constipation prior to that.  In the days immediately prior to his presentation he had also experienced some maroon-colored stools.  Consultants:  None  Goals of Care:  Code Status: Full Code   DVT prophylaxis: SCDs  Interim Hx: Colonoscopy this morning revealed atypical nodular lesions at the IC valve which were biopsied.  The patient is seen post endoscopy.  He is alert and conversant.  He has no complaints.  He has not noticed any further bleeding.  He tolerated a postendoscopy diet without difficulty.  He remains slightly lightheaded with postural changes but states this has improved.  Assessment & Plan:  GI bleed - Nodular abnormalities at IC valve on colonoscopy Patient reported both black tarry stools and some maroon-colored stools before presentation, making it hard to guess the likely bleeding locale - CTa bleeding scan w/o obvious high grade blood loss - EGD 4/15 w/o obvious source of bleeding - attempt to continue DAPT given recent deployment of DES - guaiac positive this admission - colo on 4/15 noted nodular mucosal abnormalities at the IC valve which were biopsied  Acute blood loss anemia Baseline hemoglobin 12.1 approximately 1 month ago with hemoglobin 8.96 at presentation - 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 -patient has been counseled that the ideal hemoglobin given his history of CAD is 8.0 or better but reconfirms that  he would not except a blood transfusion -hemoglobin holding steady for now  Recent Labs  Lab 07/12/22 1243 07/13/22 0233 07/13/22 1019 07/14/22 0421 07/15/22 0417  HGB 8.9* 8.7* 8.6* 8.0* 8.1*   CAD status post DES 05/20/22 Attempt to continue DAPT unless true life-threatening bleeding encountered - I have counseled the patient and his wife on this at length to include an explanation of the risk associated with stopping DAPT as well as the risk associated with continuing it  Chronic orthostatic hypotension Continue usual midodrine  Uncontrolled DM2 with hyperglycemia Utilizes insulin at home -CBG well-controlled at present   Family Communication: Spoke with wife at bedside Disposition:  eventual return home -if biopsy results available and hemoglobin is stable there is potential he could be discharged 4/16   Objective: Blood pressure 110/76, pulse 76, temperature 98 F (36.7 C), temperature source Oral, resp. rate 17, SpO2 99 %.  Intake/Output Summary (Last 24 hours) at 07/15/2022 1444 Last data filed at 07/15/2022 0920 Gross per 24 hour  Intake 589.32 ml  Output 0 ml  Net 589.32 ml   There were no vitals filed for this visit.  Examination: General: No acute respiratory distress Lungs: Clear to auscultation bilaterally without wheezes or crackles Cardiovascular: Regular rate and rhythm without murmur  Abdomen: Nontender, nondistended, soft, bowel sounds positive, no rebound Extremities: Trace edema bilateral lower extremities  CBC: Recent Labs  Lab 07/12/22 1243 07/13/22 0233 07/13/22 1019 07/14/22 0421 07/15/22 0417  WBC 6.5   < > 5.3 6.7 6.6  NEUTROABS 4.6  --   --   --   --  HGB 8.9*   < > 8.6* 8.0* 8.1*  HCT 27.7*   < > 26.3* 25.1* 24.4*  MCV 86.6   < > 85.1 87.2 84.7  PLT 283   < > 266 226 254   < > = values in this interval not displayed.   Basic Metabolic Panel: Recent Labs  Lab 07/13/22 0233 07/14/22 0421 07/15/22 0417  NA 136 137 139  K 3.8  3.8 3.7  CL 102 103 103  CO2 23 26 25   GLUCOSE 100* 104* 139*  BUN 23* 15 14  CREATININE 1.22 1.28* 1.21  CALCIUM 8.6* 8.4* 8.6*  PHOS  --   --  3.9   GFR: Estimated Creatinine Clearance: 101.7 mL/min (by C-G formula based on SCr of 1.21 mg/dL).   Scheduled Meds:  aspirin EC  81 mg Oral Daily   atorvastatin  80 mg Oral Daily   carvedilol  3.125 mg Oral BID WC   insulin aspart  0-5 Units Subcutaneous QHS   insulin aspart  0-9 Units Subcutaneous TID WC   midodrine  10 mg Oral TID WC   pantoprazole  40 mg Oral Daily   senna-docusate  1 tablet Oral QHS   ticagrelor  90 mg Oral BID     LOS: 1 day   Lonia Blood, MD [facility] Office  [phone] Pager - Text Page per Loretha Stapler  If 7PM-7AM, please contact night-coverage per Amion 07/15/2022, 2:44 PM

## 2022-07-15 NOTE — Interval H&P Note (Signed)
History and Physical Interval Note: For colonoscopy today to evaluate GI bleeding after unremarkable upper endoscopy.  Patient is on Brilinta after PCI in February.  Antiplatelet therapy is uninterrupted this morning.  He is Jehovah's Witness and therefore does not want blood transfusion.  We discussed the possibility of should we find a large polyp which may be the source of bleeding we would have to decide whether to remove it today with postintervention clipping versus heparin bridging to colonoscopy later this week.  In the setting of no possibility of blood transfusion the latter may be preferred.  We discussed the risk, benefits and alternatives to colonoscopy and he is agreeable and wishes to proceed.  07/15/2022 8:20 AM  [name]  has presented today for surgery, with the diagnosis of anemia.  The various methods of treatment have been discussed with the patient and family. After consideration of risks, benefits and other options for treatment, the patient has consented to  Procedure(s): COLONOSCOPY (N/A) as a surgical intervention.  The patient's history has been reviewed, patient examined, no change in status, stable for surgery.  I have reviewed the patient's chart and labs.  Questions were answered to the patient's satisfaction.     Micheal Neal

## 2022-07-16 ENCOUNTER — Encounter: Payer: Self-pay | Admitting: Internal Medicine

## 2022-07-16 ENCOUNTER — Encounter (HOSPITAL_COMMUNITY): Payer: Self-pay | Admitting: Internal Medicine

## 2022-07-16 DIAGNOSIS — I639 Cerebral infarction, unspecified: Secondary | ICD-10-CM

## 2022-07-16 DIAGNOSIS — K5190 Ulcerative colitis, unspecified, without complications: Secondary | ICD-10-CM | POA: Diagnosis not present

## 2022-07-16 DIAGNOSIS — K2100 Gastro-esophageal reflux disease with esophagitis, without bleeding: Secondary | ICD-10-CM

## 2022-07-16 DIAGNOSIS — D123 Benign neoplasm of transverse colon: Secondary | ICD-10-CM | POA: Diagnosis not present

## 2022-07-16 DIAGNOSIS — K648 Other hemorrhoids: Secondary | ICD-10-CM | POA: Diagnosis not present

## 2022-07-16 DIAGNOSIS — K921 Melena: Secondary | ICD-10-CM | POA: Diagnosis not present

## 2022-07-16 DIAGNOSIS — D124 Benign neoplasm of descending colon: Secondary | ICD-10-CM | POA: Diagnosis not present

## 2022-07-16 LAB — CBC
HCT: 25.3 % — ABNORMAL LOW (ref 39.0–52.0)
Hemoglobin: 8.5 g/dL — ABNORMAL LOW (ref 13.0–17.0)
MCH: 28.7 pg (ref 26.0–34.0)
MCHC: 33.6 g/dL (ref 30.0–36.0)
MCV: 85.5 fL (ref 80.0–100.0)
Platelets: 259 10*3/uL (ref 150–400)
RBC: 2.96 MIL/uL — ABNORMAL LOW (ref 4.22–5.81)
RDW: 13.5 % (ref 11.5–15.5)
WBC: 6.4 10*3/uL (ref 4.0–10.5)
nRBC: 0 % (ref 0.0–0.2)

## 2022-07-16 LAB — SURGICAL PATHOLOGY

## 2022-07-16 LAB — CORTISOL: Cortisol, Plasma: 6.3 ug/dL

## 2022-07-16 LAB — GLUCOSE, CAPILLARY: Glucose-Capillary: 125 mg/dL — ABNORMAL HIGH (ref 70–99)

## 2022-07-16 NOTE — Evaluation (Signed)
Physical Therapy Evaluation Patient Details Name: Micheal Neal MRN: [id number] DOB: 07-21-1970 Today's Date: 07/16/2022  History of Present Illness  52 yo male presents [facility] on 4/12 with dizziness, rectal bleeding. EGD 4/13 unrevealing, colonoscopy 4/15 shows nodular and abnormal mucosa at IC valve, biopsied; two small polyps in descending and transverse colon. PMH includes CAD s/p MI and stent 05/2022, DM, GERD.  Clinical Impression   Pt presents with [facility] strength, balance, and activity tolerance, pt's limiting factor is symptomatic orthostatic hypotension. Pt ambulated 100+ feet without difficulty and without support, does report dizziness upon initial standing with BP 70/37 (see previous note), but this passed with seated rest and BP rebounded to 95/47 post-gait with less dizziness reported. Pt endorses problems with orthostatic hypotension x1.5 years, states ted hose and abdominal binder have been ineffective at managing orthostatic hypotension and some days pt has trouble even standing. Pt continues to follow up with PCP about this problem. Pt with no acute or post-acute PT needs at this time, PT To sign off.        Recommendations for follow up therapy are one component of a multi-disciplinary discharge planning process, led by the attending physician.  Recommendations may be updated based on patient status, additional functional criteria and insurance authorization.  Follow Up Recommendations       Assistance Recommended at Discharge PRN  Patient can return home with the following       Equipment Recommendations None recommended by PT  Recommendations for Other Services       Functional Status Assessment Patient has not had a recent decline in their functional status     Precautions / Restrictions Precautions Precautions: Fall (given orthostatic hypotension) Restrictions Weight Bearing Restrictions: No      Mobility  Bed Mobility Overal bed mobility: Modified  Independent             General bed mobility comments: increased time    Transfers Overall transfer level: Modified independent                 General transfer comment: slow to rise, no physical assist. + orthostatic dizziness    Ambulation/Gait Ambulation/Gait assistance: Modified independent (Device/Increase time) Gait Distance (Feet): 120 Feet Assistive device: Straight cane Gait Pattern/deviations: Step-through pattern, WFL(Within Functional Limits)       General Gait Details: WFL, no gait deviations  Stairs            Wheelchair Mobility    Modified Rankin (Stroke Patients Only)       Balance Overall balance assessment: Modified Independent (history of falls related to orthostatic hypotension)                                           Pertinent Vitals/Pain Pain Assessment Pain Assessment: No/denies pain    Home Living Family/patient expects to be discharged to:: Private residence Living Arrangements: Spouse/significant other Available Help at Discharge: Family Type of Home: House Home Access: Stairs to enter   Secretary/administrator of Steps: 3   Home Layout: One level Home Equipment: None      Prior Function Prior Level of Function : Independent/Modified Independent;History of Falls (last six months)                     Hand Dominance   Dominant Hand: Right    Extremity/Trunk Assessment   Upper Extremity Assessment  Upper Extremity Assessment: Overall WFL for tasks assessed    Lower Extremity Assessment Lower Extremity Assessment: Overall WFL for tasks assessed    Cervical / Trunk Assessment Cervical / Trunk Assessment: Normal  Communication   Communication: No difficulties  Cognition Arousal/Alertness: Awake/alert Behavior During Therapy: WFL for tasks assessed/performed Overall Cognitive Status: Within Functional Limits for tasks assessed                                           General Comments      Exercises     Assessment/Plan    PT Assessment Patient does not need any further PT services  PT Problem List         PT Treatment Interventions      PT Goals (Current goals can be found in the Care Plan section)  Acute Rehab PT Goals Patient Stated Goal: home PT Goal Formulation: With patient Time For Goal Achievement: 07/16/22 Potential to Achieve Goals: Good    Frequency       Co-evaluation               AM-PAC PT "6 Clicks" Mobility  Outcome Measure Help needed turning from your back to your side while in a flat bed without using bedrails?: None Help needed moving from lying on your back to sitting on the side of a flat bed without using bedrails?: None Help needed moving to and from a bed to a chair (including a wheelchair)?: None Help needed standing up from a chair using your arms (e.g., wheelchair or bedside chair)?: None Help needed to walk in hospital room?: None Help needed climbing 3-5 steps with a railing? : A Little 6 Click Score: 23    End of Session   Activity Tolerance: Patient tolerated treatment well Patient left: in bed;with call bell/phone within reach Nurse Communication: Mobility status PT Visit Diagnosis: Other abnormalities of gait and mobility (R26.89);Muscle weakness (generalized) (M62.81)    Time: 4098-1191 PT Time Calculation (min) (ACUTE ONLY): 16 min   Charges:   PT Evaluation $PT Eval Low Complexity: 1 Low          Rakin Lemelle S, PT DPT Acute Rehabilitation Services Pager [phone]  Office [phone]   Sahand Gosch E Christain [place] 07/16/2022, 10:39 AM

## 2022-07-16 NOTE — Progress Notes (Signed)
   07/16/22 0900  Orthostatic Lying   BP- Lying 122/78  Pulse- Lying 81  Orthostatic Sitting  BP- Sitting 107/76  Pulse- Sitting 86  Orthostatic Standing at 0 minutes  BP- Standing at 0 minutes (!) 70/37  Pulse- Standing at 0 minutes 88  Orthostatic Standing at 3 minutes  BP- Standing at 3 minutes 95/47  Pulse- Standing at 3 minutes 115    Modupe Shampine S, PT DPT Acute Rehabilitation Services Pager [phone]  Office [phone]

## 2022-07-16 NOTE — Discharge Summary (Signed)
DISCHARGE SUMMARY  Micheal Neal  MR#: [id number]  DOB:30-Aug-1970  Date of Admission: 07/12/2022 Date of Discharge: 07/16/2022  Attending Physician:Chessa Barrasso Silvestre Gunner, MD  Patient's WUJ:WJXB, [place] Scrape, NP  Consults: [facility]  Disposition: D/C home   Follow-up Appts:  Follow-up Information     Micheal Paddy, NP Follow up in 7 day(s).   Specialty: Nurse Practitioner Contact information: [address] [place] [address] [phone]         Beverley Fiedler, MD Follow up.   Specialty: Gastroenterology Why: The office will call you to arrange for a follow up visit. Contact information: [address] [place] [address] [phone]                 Tests Needing Follow-up: -recheck of CBC in 5-7 days is suggested   Discharge Diagnoses: GI bleed - unclear source  Nodular abnormalities at IC valve on colonoscopy - path benign  Acute blood loss anemia CAD status post DES 05/20/22 Chronic orthostatic hypotension Uncontrolled DM2 with hyperglycemia  Initial presentation: 52 year old with a history of DM2, chronic orthostatic hypotension on midodrine, and CAD who suffered a NSTEMI in February 2024 leading to placement of a DES with subsequent DAPT. She he presented to the ED 07/12/2022 with complaints of dizziness on standing which have been progressively worsening over 4 to 5 days. He was seen by his PCP and found to be anemic. He reported black tarry stools over the last week with constipation prior to that. In the days immediately prior to his presentation he had also experienced some maroon-colored stools.   Hospital Course:  GI bleed - Nodular abnormalities at IC valve on colonoscopy Patient reported both black tarry stools and some maroon-colored stools before presentation, making it hard to guess the likely bleeding locale - CTa bleeding scan w/o obvious high grade blood loss - EGD 4/15 w/o obvious source of bleeding - continued DAPT th/o hospital  course given recent deployment of DES - guaiac positive this admission - colo on 4/15 noted nodular mucosal abnormalities at the IC valve which were biopsied w/ benign path - advised to avoid NSAIDs - to f/u w/ GI for repeat endoscopy in ~6 months - f/u w/ PCP for precautionary CBC check in 5-7 days    Acute blood loss anemia Baseline hemoglobin 12.1 approximately 1 month prior to this admit with hemoglobin 8.96 at presentation - of note patient is a Jehovah's Witness and will not accept blood products - patient has been counseled that the ideal hemoglobin given his history of CAD is 8.0 or better but reconfirms that he would not except a blood transfusion - hemoglobin holding steady at time of dc w/o evidence of gross bleeding    CAD status post DES 05/20/22 Continued DAPT th/o hospitalization - I counseled the patient and his wife on this at length to include an explanation of the risk associated with stopping DAPT as well as the risk associated with continuing it   Chronic orthostatic hypotension Continue usual midodrine   Uncontrolled DM2 with hyperglycemia Utilizes insulin at home -CBG well-controlled at present - resume prior home regimen at time of d/c   Allergies as of 07/16/2022   No Known Allergies      Medication List     STOP taking these medications    terbinafine 250 MG tablet Commonly known as: LamISIL       TAKE these medications    aspirin EC 81 MG tablet Take 1 tablet (  81 mg total) by mouth daily. Swallow whole. What changed: additional instructions   atorvastatin 80 MG tablet Commonly known as: LIPITOR Take 1 tablet (80 mg total) by mouth daily.   Brilinta 90 MG Tabs tablet Generic drug: ticagrelor Take 1 tablet (90 mg total) by mouth 2 (two) times daily.   carvedilol 3.125 MG tablet Commonly known as: COREG Take 1 tablet (3.125 mg total) by mouth 2 (two) times daily with a meal.   CYANOCOBALAMIN IJ Inject 1 Dose as directed every 30 (thirty) days.    Dexcom G7 Sensor Misc Use as directed. Change every 10 days.   fluorometholone 0.1 % ophthalmic suspension Commonly known as: FML Instill 1 drop into right eye four times a day   glipiZIDE 10 MG 24 hr tablet Commonly known as: Glucotrol XL Take 1 tablet (10 mg total) by mouth daily with breakfast.   Jardiance 10 MG Tabs tablet Generic drug: empagliflozin Take 1 tablet (10 mg total) by mouth daily.   midodrine 10 MG tablet Commonly known as: PROAMATINE Take 1 tablet (10 mg total) by mouth 3 (three) times daily.   NOVOLIN 70/30 FLEXPEN Orin Inject 15 Units into the skin 2 (two) times daily. What changed: Another medication with the same name was removed. Continue taking this medication, and follow the directions you see here.   omega-3 acid ethyl esters 1 g capsule Commonly known as: LOVAZA Take 2 capsules (2 g total) by mouth 2 (two) times daily.   TRUEplus Pen Needles 31G X 5 MM Misc Generic drug: Insulin Pen Needle USE AS DIRECTED FOUR TIMES DAILY   Vitamin D (Ergocalciferol) 1.25 MG (50000 UNIT) Caps capsule Commonly known as: DRISDOL Take 1 capsule (50,000 Units total) by mouth every 7 (seven) days.        Day of Discharge BP 130/82 (BP Location: Left Wrist)   Pulse 72   Temp (!) 97.4 F (36.3 C) (Oral)   Resp 15   SpO2 100%   Physical Exam: General: No acute respiratory distress Lungs: Clear to auscultation bilaterally without wheezes or crackles Cardiovascular: Regular rate and rhythm without murmur gallop or rub normal S1 and S2 Abdomen: Nontender, nondistended, soft, bowel sounds positive, no rebound, no ascites, no appreciable mass Extremities: No significant cyanosis, clubbing, or edema bilateral lower extremities  Basic Metabolic Panel: Recent Labs  Lab 07/11/22 1045 07/12/22 1243 07/13/22 0233 07/14/22 0421 07/15/22 0417  NA 137 135 136 137 139  K 4.4 4.4 3.8 3.8 3.7  CL 102 102 102 103 103  CO2 GLUCOSE 118* 255* 100* 104*  139*  BUN 32* 29* 23* 15 14  CREATININE 1.42 1.33* 1.22 1.28* 1.21  CALCIUM 9.2 8.7* 8.6* 8.4* 8.6*  PHOS  --   --   --   --  3.9   CBC: Recent Labs  Lab 07/12/22 1243 07/13/22 0233 07/13/22 1019 07/14/22 0421 07/15/22 0417 07/16/22 0500  WBC 6.5 6.7 5.3 6.7 6.6 6.4  NEUTROABS 4.6  --   --   --   --   --   HGB 8.9* 8.7* 8.6* 8.0* 8.1* 8.5*  HCT 27.7* 26.3* 26.3* 25.1* 24.4* 25.3*  MCV 86.6 85.4 85.1 87.2 84.7 85.5  PLT 283 265 266 226 254 259    Time spent in discharge (includes decision making & examination of pt): 35 minutes  07/16/2022, 11:16 AM   Lonia Blood, MD [facility] Office  [phone]

## 2022-07-16 NOTE — Progress Notes (Signed)
  Transition of Care [facility] Screening Note   Patient Details  Name: Micheal Neal Date of Birth: 06-25-1970   Transition of Care [facility]) CM/SW Contact:    Harriet Masson, RN Phone Number: 07/16/2022, 9:00 AM    Transition of Care Department [facility]) has reviewed patient and no TOC needs have been identified at this time. We will continue to monitor patient advancement through interdisciplinary progression rounds. If new patient transition needs arise, please place a TOC consult.

## 2022-07-16 NOTE — Anesthesia Postprocedure Evaluation (Signed)
Anesthesia Post Note  Patient: Micheal Neal  Procedure(s) Performed: ESOPHAGOGASTRODUODENOSCOPY (EGD) BIOPSY     Patient location during evaluation: Endoscopy Anesthesia Type: MAC Level of consciousness: awake and alert Pain management: pain level controlled Vital Signs Assessment: post-procedure vital signs reviewed and stable Respiratory status: spontaneous breathing, nonlabored ventilation, respiratory function stable and patient connected to nasal cannula oxygen Cardiovascular status: stable and blood pressure returned to baseline Postop Assessment: no apparent nausea or vomiting Anesthetic complications: no   No notable events documented.  Last Vitals:  Vitals:   07/16/22 0401 07/16/22 0821  BP: 123/68 130/82  Pulse: 74 72  Resp: 15   Temp: (!) 36.4 C (!) 36.3 C  SpO2: 100% 100%    Last Pain:  Vitals:   07/16/22 0821  TempSrc: Oral  PainSc:                  [name] S

## 2022-07-16 NOTE — Progress Notes (Signed)
    Progress Note   Assessment    52 year old with CAD status post NSTEMI requiring DES in February 2024 on Brilinta and aspirin presenting with symptomatic anemia and GI bleed found to have ulceration at ileocecal valve.   Recommendations   1.  GI bleed/posthemorrhagic anemia/ulceration and ileocecal valve --very focal ulceration at ileocecal valve, biopsies reviewed including with the patient and his wife.  They are benign.  Most consistent with medication/NSAID.  He was using frequent and rather high-dose ibuprofen prior to his MI.  None since -- Okay for discharge home -- Iron rich diet after discharge -- PCP should repeat CBC in 7 to 14 days -- GI follow-up in 2 to 3 months with me or APP; consider repeat colonoscopy in 3 to 6 months to ensure healing at ileocecal valve -- Continuing Brilinta and aspirin in the setting of drug-eluting stent placement 2 months ago -- NSAIDs and additional aspirin other than the 81 mg daily should be avoided     Chief Complaint   Feels well No abdominal pain Tolerating diet Ready to go home Wife at bedside  Vital signs in last 24 hours: Temp:  [97.4 F (36.3 C)-98.7 F (37.1 C)] 97.4 F (36.3 C) (04/16 0821) Pulse Rate:  [72-85] 72 (04/16 0821) Resp:  [15-17] 15 (04/16 0401) BP: (106-130)/(68-82) 130/82 (04/16 0821) SpO2:  [99 %-100 %] 100 % (04/16 0821) Last BM Date : 07/15/22  General: Alert, well-developed, in NAD Heart:  Regular rate and rhythm; no murmurs Chest: Clear to ascultation bilaterally Abdomen:  Soft, nontender and nondistended. Normal bowel sounds, without guarding, and without rebound.   Extremities:  Without edema. Neurologic:  Alert and  oriented x4; grossly normal neurologically. Psych:  Alert and cooperative. Normal mood and affect.  Intake/Output from previous day: 04/15 0701 - 04/16 0700 In: 300 [I.V.:300] Out: 0  Intake/Output this shift: No intake/output data recorded.  Lab Results: Recent Labs     07/14/22 0421 07/15/22 0417 07/16/22 0500  WBC 6.7 6.6 6.4  HGB 8.0* 8.1* 8.5*  HCT 25.1* 24.4* 25.3*  PLT 226 254 259   BMET Recent Labs    07/14/22 0421 07/15/22 0417  NA 137 139  K 3.8 3.7  CL 103 103  CO2 26 25  GLUCOSE 104* 139*  BUN 15 14  CREATININE 1.28* 1.21  CALCIUM 8.4* 8.6*   LFT Recent Labs    07/15/22 0417  ALBUMIN 3.3*    Studies/Results: No results found.    LOS: 2 days   Beverley Fiedler, MD 07/16/2022, 12:00 PM See Loretha Stapler, [facility], to contact our on call provider

## 2022-07-17 ENCOUNTER — Telehealth: Payer: Self-pay | Admitting: *Deleted

## 2022-07-17 ENCOUNTER — Other Ambulatory Visit (HOSPITAL_COMMUNITY): Payer: Self-pay

## 2022-07-17 ENCOUNTER — Encounter: Payer: Self-pay | Admitting: *Deleted

## 2022-07-17 ENCOUNTER — Other Ambulatory Visit: Payer: Self-pay

## 2022-07-17 NOTE — Transitions of Care (Post Inpatient/ED Visit) (Signed)
07/17/2022  Name: Micheal Neal MRN: [id number] DOB: [birth year]  Today's TOC FU Call Status: Today's TOC FU Call Status:: Successful TOC FU Call Competed TOC FU Call Complete Date: 07/17/22  Transition Care Management Follow-up Telephone Call Date of Discharge: 07/16/22 Discharge Facility: [facility]) Type of Discharge: Inpatient Admission Primary Inpatient Discharge Diagnosis:: GI bleeding/ baseline hypotension in setting of ACT/ CAD How have you been since you were released from the hospital?: Better ("overall I am doing okay, feeling better.  I was a little dizzy this morning but that is nothing new, I get dizzy feeling a lot, about every day.  I am okay to drive myself to all these new doctor appointments") Any questions or concerns?: No  Items Reviewed: Did you receive and understand the discharge instructions provided?: Yes (thoroughly reviewed with patient who verbalizes good understanding of same) Medications obtained and verified?: Yes (Medications Reviewed) (Full medication review completed; no concerns or discrepancies identified; confirmed patient obtained/ is taking all newly Rx'd medications as instructed; self-manages medications and denies questions/ concerns around medications today) Any new allergies since your discharge?: No Dietary orders reviewed?: Yes Type of Diet Ordered:: "heart healthy, diabetic, as much as possible" Do you have support at home?: Yes People in Home: child(ren), adult, spouse Name of Support/Comfort Primary Source: Reports independent in self-care activities; spouse/ adult children assists as/ if needed/ indicated  Home Care and Equipment/Supplies: Were Home Health Services Ordered?: No Any new equipment or medical supplies ordered?: No  Functional Questionnaire: Do you need assistance with bathing/showering or dressing?: No Do you need assistance with meal preparation?: No Do you need assistance with eating?: No Do you have difficulty  maintaining continence: No Do you need assistance with getting out of bed/getting out of a chair/moving?: No Do you have difficulty managing or taking your medications?: No  Follow up appointments reviewed: PCP Follow-up appointment confirmed?: Yes Date of PCP follow-up appointment?: 08/02/22 (verified this is recommended time frame for follow up per hospital discharging provider notes) Follow-up Provider: PCP Specialist Hospital Follow-up appointment confirmed?: Yes Date of Specialist follow-up appointment?: 07/24/22 Follow-Up Specialty Provider:: hematology provider 07/24/22; GI provider 07/25/22 Do you need transportation to your follow-up appointment?: No Do you understand care options if your condition(s) worsen?: Yes-patient verbalized understanding  SDOH Interventions Today    Flowsheet Row Most Recent Value  SDOH Interventions   Food Insecurity Interventions Intervention Not Indicated  Transportation Interventions Intervention Not Indicated  [patient drives self at baseline,  wife assists as needed/ indicated]      TOC Interventions Today    Flowsheet Row Most Recent Value  TOC Interventions   TOC Interventions Discussed/Reviewed TOC Interventions Discussed  [provided my direct contact information should questions/ concerns/ needs arise post-TOC call, prior to RN CM telephone visit]      Interventions Today    Flowsheet Row Most Recent Value  Chronic Disease   Chronic disease during today's visit Other  [GI bleeding in setting of ACT]  General Interventions   General Interventions Discussed/Reviewed General Interventions Discussed, Doctor Visits, Referral to Nurse, Communication with, Labs  Labs Hgb A1c every 3 months  Doctor Visits Discussed/Reviewed Doctor Visits Discussed, Specialist, PCP  PCP/Specialist Visits Compliance with follow-up visit  Communication with RN  Education Interventions   Education Provided Provided Education  Provided Verbal Education On  Other, Medication  [signs/ symptoms low blood sugar with corresponding action plan,  side effect of glipizide- hypoglycemia- need to eat consistently when taking, especially along with insulin,  importance of having DPR form completed]  Nutrition Interventions   Nutrition Discussed/Reviewed Nutrition Discussed  Pharmacy Interventions   Pharmacy Dicussed/Reviewed Pharmacy Topics Discussed  [Full medication review with updating medication list in EHR per patient report]      Caryl Pina, RN, BSN, CCRN Alumnus RN CM Care Coordination/ Transition of Care- [facility] Care Management [phone]: direct office

## 2022-07-18 ENCOUNTER — Telehealth: Payer: Self-pay

## 2022-07-18 ENCOUNTER — Other Ambulatory Visit (HOSPITAL_COMMUNITY): Payer: Self-pay

## 2022-07-18 NOTE — Telephone Encounter (Signed)
Friday, 09/27/22 at 2:10 pm with Dr. Tomasa Rand. Appt information mailed and sent to patient via MyChart.

## 2022-07-18 NOTE — Telephone Encounter (Signed)
-----   Message from Beverley Fiedler, MD sent at 07/16/2022 12:03 PM EDT ----- Pt needs OV with Tomasa Rand or APP in 8-12 weeks Lower GI bleed Discharged today Thanks JMP

## 2022-07-19 ENCOUNTER — Other Ambulatory Visit (HOSPITAL_COMMUNITY): Payer: Self-pay

## 2022-07-19 ENCOUNTER — Telehealth: Payer: Self-pay | Admitting: Nurse Practitioner

## 2022-07-19 DIAGNOSIS — D649 Anemia, unspecified: Secondary | ICD-10-CM

## 2022-07-19 LAB — NO BLOOD PRODUCTS

## 2022-07-19 LAB — SURGICAL PATHOLOGY

## 2022-07-19 NOTE — Telephone Encounter (Signed)
Left detail message for pt to let him know to come to lab to get a repeat of his CBC done with the next 5-7 days and also give our office a call

## 2022-07-19 NOTE — Telephone Encounter (Signed)
Please call patient and let him know that I read his discharge summary from recent hospitalization and they recommended that he have repeat CBC completed in 5 to 7 days.  I see that he was unable to schedule appointment with me within this timeline so I am going to order labs that he can come to the office and have drawn at his earliest convenience next week, earlier is better than later.  I will review these and notify him of results, have him keep his follow-up appointment as scheduled if possible.  Thank you.

## 2022-07-22 ENCOUNTER — Other Ambulatory Visit (HOSPITAL_COMMUNITY): Payer: Self-pay

## 2022-07-22 ENCOUNTER — Ambulatory Visit (INDEPENDENT_AMBULATORY_CARE_PROVIDER_SITE_OTHER): Payer: 59

## 2022-07-22 DIAGNOSIS — E538 Deficiency of other specified B group vitamins: Secondary | ICD-10-CM | POA: Diagnosis not present

## 2022-07-22 MED ORDER — CYANOCOBALAMIN 1000 MCG/ML IJ SOLN
1000.0000 ug | Freq: Once | INTRAMUSCULAR | Status: AC
Start: 2022-07-22 — End: 2022-07-22
  Administered 2022-07-22: 1000 ug via INTRAMUSCULAR

## 2022-07-22 NOTE — Progress Notes (Signed)
After obtaining consent, and per orders of  Sarah Gray NP, injection of B12 given by Ilyanna Baillargeon P Kanaan Kagawa. Patient instructed to report any adverse reaction to me immediately.  

## 2022-07-23 ENCOUNTER — Other Ambulatory Visit (HOSPITAL_COMMUNITY): Payer: Self-pay

## 2022-07-23 NOTE — Progress Notes (Signed)
Micheal Neal,  The biopsies taken from your stomach were notable for mild chronic gastritis (inflammation) which is a common finding, but there was no evidence of Helicobacter pylori infection. This common finding is not likely to explain anemia and there is no specific treatment or further evaluation recommended. Please follow up as scheduled

## 2022-07-24 ENCOUNTER — Inpatient Hospital Stay: Payer: 59 | Attending: Hematology | Admitting: Hematology

## 2022-07-24 ENCOUNTER — Other Ambulatory Visit (HOSPITAL_COMMUNITY): Payer: Self-pay

## 2022-07-24 ENCOUNTER — Telehealth: Payer: Self-pay | Admitting: Cardiovascular Disease

## 2022-07-24 ENCOUNTER — Other Ambulatory Visit: Payer: Self-pay

## 2022-07-24 VITALS — BP 89/58 | HR 82 | Temp 97.5°F | Resp 17 | Ht 74.0 in | Wt 260.1 lb

## 2022-07-24 DIAGNOSIS — E538 Deficiency of other specified B group vitamins: Secondary | ICD-10-CM | POA: Insufficient documentation

## 2022-07-24 DIAGNOSIS — D649 Anemia, unspecified: Secondary | ICD-10-CM | POA: Diagnosis not present

## 2022-07-24 DIAGNOSIS — I252 Old myocardial infarction: Secondary | ICD-10-CM | POA: Insufficient documentation

## 2022-07-24 DIAGNOSIS — Z7902 Long term (current) use of antithrombotics/antiplatelets: Secondary | ICD-10-CM

## 2022-07-24 DIAGNOSIS — Z803 Family history of malignant neoplasm of breast: Secondary | ICD-10-CM

## 2022-07-24 LAB — CBC
HCT: 33.6 % — ABNORMAL LOW (ref 39.0–52.0)
Hemoglobin: 10.4 g/dL — ABNORMAL LOW (ref 13.0–17.0)
MCH: 27.9 pg (ref 26.0–34.0)
MCHC: 31 g/dL (ref 30.0–36.0)
MCV: 90.1 fL (ref 80.0–100.0)
Platelets: 330 10*3/uL (ref 150–400)
RBC: 3.73 MIL/uL — ABNORMAL LOW (ref 4.22–5.81)
RDW: 14.6 % (ref 11.5–15.5)
WBC: 4.8 10*3/uL (ref 4.0–10.5)
nRBC: 0 % (ref 0.0–0.2)

## 2022-07-24 LAB — RETICULOCYTES
Immature Retic Fract: 22.9 % — ABNORMAL HIGH (ref 2.3–15.9)
RBC.: 3.66 MIL/uL — ABNORMAL LOW (ref 4.22–5.81)
Retic Count, Absolute: 167 10*3/uL (ref 19.0–186.0)
Retic Ct Pct: 4.6 % — ABNORMAL HIGH (ref 0.4–3.1)

## 2022-07-24 LAB — VITAMIN B12: Vitamin B-12: 861 pg/mL (ref 180–914)

## 2022-07-24 LAB — FOLATE: Folate: 10.1 ng/mL (ref >5.9)

## 2022-07-24 LAB — IRON AND TIBC
Iron: 49 ug/dL (ref 45–182)
Saturation Ratios: 10 % — ABNORMAL LOW (ref 17.9–39.5)
TIBC: 502 ug/dL — ABNORMAL HIGH (ref 250–450)
UIBC: 453 ug/dL

## 2022-07-24 LAB — FERRITIN: Ferritin: 12 ng/mL — ABNORMAL LOW (ref 24–336)

## 2022-07-24 LAB — LACTATE DEHYDROGENASE: LDH: 127 U/L (ref 98–192)

## 2022-07-24 LAB — DIRECT ANTIGLOBULIN TEST (NOT AT ARMC)
DAT, IgG: NEGATIVE
DAT, complement: NEGATIVE

## 2022-07-24 MED ORDER — CLOPIDOGREL BISULFATE 75 MG PO TABS
ORAL_TABLET | ORAL | 3 refills | Status: DC
  Filled 2022-07-24: qty 34, 30d supply, fill #0
  Filled 2022-08-20: qty 30, 30d supply, fill #1
  Filled 2022-09-16: qty 30, 30d supply, fill #2
  Filled 2022-10-18: qty 30, 30d supply, fill #3
  Filled 2022-11-03 – 2022-11-15 (×2): qty 30, 30d supply, fill #4
  Filled 2022-11-29 – 2022-12-18 (×2): qty 30, 30d supply, fill #5
  Filled 2023-01-25: qty 30, 30d supply, fill #6
  Filled 2023-02-25: qty 30, 30d supply, fill #7
  Filled 2023-03-30: qty 30, 30d supply, fill #8
  Filled 2023-04-16 – 2023-05-02 (×4): qty 30, 30d supply, fill #9
  Filled 2023-05-28: qty 30, 30d supply, fill #10
  Filled 2023-06-13 – 2023-06-29 (×2): qty 30, 30d supply, fill #11
  Filled 2023-07-15: qty 30, 30d supply, fill #12

## 2022-07-24 NOTE — Telephone Encounter (Signed)
Spoke to the patient, he stated his health insurance will no longer cover the cost of Brilinta. He like to know if there is a generic form of this medication. Will forward to APP

## 2022-07-24 NOTE — Telephone Encounter (Signed)
Spoke to the patient, explained Micheal Server, NP recommendation:  Please let patient know that pharmacy is checking with Dr. Allyson Sabal to see if he can switch to Plavix. They will be in touch with recommendations when available.   Please advise patient to continue his midodrine and record his blood pressures over the next  few days and return t.he readings back to our office.  Please continue to stay hydrated and  continue liberal salt intake.  If your symptoms become worse in please contact our  office or follow-up in the ED for further treatment   Patient voiced understanding.

## 2022-07-24 NOTE — Telephone Encounter (Signed)
Checked on his insurance, Micheal Neal is not a formulary item.  Do you want to go with clopidogrel (+/-loading dose)?

## 2022-07-24 NOTE — Progress Notes (Signed)
[facility] [address] [place] [address]   Clinic Day:  07/24/2022  Referring physician: [name], NP  Patient Care Team: [name], NP as PCP - General (Nurse Practitioner) Kathleene Hazel, MD as PCP - Cardiology (Cardiology)   ASSESSMENT & PLAN:   Assessment:  1.  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 - Colonoscopy (07/15/2022): Terminal ileum normal.  2 small polyps in the descending colon and in the transverse colon, resection not attempted as patient on Brilinta.  Diminutive internal hemorrhoids. - EGD (07/13/2022): No obvious source of bleeding. - CT angio (07/12/2022): No evidence of acute hemorrhage. - Drop in hemoglobin from between 12-13-8.9.  Ferritin was 53 on 07/11/2022. - He feels lightheaded.  No ice pica.  No B symptoms.  2.  Social/family history: - No family history of severe anemia. - He has been out of work for 1 year.  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  Non-smoker. - Maternal grandmother and maternal aunt had breast cancers.  Paternal uncle had cancer.   Plan:  1.  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 - Recommend checking CBC today and evaluate for other nutritional deficiencies.  Will also check ferritin and iron panel. - We talked about parenteral iron therapy with INFeD. - We will give him a follow-up appointment based on his hemoglobin levels from today.  2.  B12 deficiency: - He was diagnosed with low B12 levels of 142 on 04/25/2022. - He is on B12 monthly injections.   Orders Placed This Encounter  Procedures   CBC    Standing Status:   Future    Number of Occurrences:   1    Standing  Expiration Date:   07/24/2023   Lactate dehydrogenase    Standing Status:   Future    Number of Occurrences:   1    Standing Expiration Date:   07/24/2023    Order Specific Question:   Release to patient    Answer:   Immediate   Reticulocytes    Standing Status:   Future    Number of Occurrences:   1    Standing Expiration Date:   07/24/2023   Ferritin    Standing Status:   Future    Number of Occurrences:   1    Standing Expiration Date:   07/24/2023    Order Specific Question:   Release to patient    Answer:   Immediate   Iron and TIBC    Standing Status:   Future    Number of Occurrences:   1    Standing Expiration Date:   07/24/2023    Order Specific Question:   Release to patient    Answer:   Immediate   Vitamin B12    Standing Status:   Future    Number of Occurrences:   1    Standing Expiration Date:   07/24/2023    Order Specific Question:   Release to patient    Answer:   Immediate   Folate    Standing Status:   Future    Number of Occurrences:   1    Standing Expiration Date:   07/24/2023  Order Specific Question:   Release to patient    Answer:   Immediate   Methylmalonic acid, serum    Standing Status:   Future    Number of Occurrences:   1    Standing Expiration Date:   07/24/2023   Copper, serum    Standing Status:   Future    Number of Occurrences:   1    Standing Expiration Date:   07/24/2023   Protein electrophoresis, serum    Standing Status:   Future    Number of Occurrences:   1    Standing Expiration Date:   07/24/2023    Order Specific Question:   Release to patient    Answer:   Immediate   Sample to Blood Bank(Blood Bank Hold)    Standing Status:   Future    Number of Occurrences:   1    Standing Expiration Date:   07/24/2023   Direct antiglobulin test    Standing Status:   Future    Number of Occurrences:   1    Standing Expiration Date:   07/24/2023      I,Katie Daubenspeck,acting as a scribe for Doreatha Massed, MD.,have documented all  relevant documentation on the behalf of Doreatha Massed, MD,as directed by  Doreatha Massed, MD while in the presence of Doreatha Massed, MD.   I, Doreatha Massed MD, have reviewed the above documentation for accuracy and completeness, and I agree with the above.   Doreatha Massed, MD   4/24/20244:35 PM  CHIEF COMPLAINT/PURPOSE OF CONSULT:   Diagnosis: anemia  Current Therapy: INFeD  HISTORY OF PRESENT ILLNESS:   Micheal Neal is a 52 y.o. male presenting to clinic today for evaluation of anemia at the request of Jiles Prows, NP.  Today, he states that he is doing well overall. His appetite level is at 85%. His energy level is at 65%.  He has a history of vitamin B12 deficiency and has received injections through his PCP for this.  He was found to have abnormal CBC from 07/11/22 with hemoglobin of 10.7. He previously had slightly low hemoglobin, but it had never gone under 12.5. Iron and ferritin obtained the same day were WNL at 69 and 53.9 respectively.  He developed some hematochezia and was referred to the ED by his PCP the following day. At that time, he was found to have a hemoglobin of 8.9. FOBT test was positive. Angio CT A/P showed no evidence of acute hemorrhage. He underwent EGD on 07/13/22 and colonoscopy on 07/15/22, both were negative for bleeding.   He never donated blood or received blood transfusion. Patient is a Scientist, product/process development.  PAST MEDICAL HISTORY:   Past Medical History: Past Medical History:  Diagnosis Date   Abnormal EKG    Anemia 07/11/2022   Contusion of left foot    Diabetes mellitus without complication    Dizziness    Encounter for hepatitis C screening test for low risk patient 07/11/2022   GERD (gastroesophageal reflux disease)    Near syncope    Palpitations    Vertigo     Surgical History: Past Surgical History:  Procedure Laterality Date   BIOPSY  07/13/2022   Procedure: BIOPSY;  Surgeon: Jenel Lucks, MD;  Location:  [facility] ENDOSCOPY;  Service: Gastroenterology;;   BIOPSY  07/15/2022   Procedure: BIOPSY;  Surgeon: Beverley Fiedler, MD;  Location: [name] ENDOSCOPY;  Service: Gastroenterology;;   COLONOSCOPY N/A 07/15/2022   Procedure: COLONOSCOPY;  Surgeon: Beverley Fiedler, MD;  Location: [facility] ENDOSCOPY;  Service: Gastroenterology;  Laterality: N/A;   CORONARY STENT INTERVENTION N/A 05/20/2022   Procedure: CORONARY STENT INTERVENTION;  Surgeon: Runell Gess, MD;  Location: [facility] INVASIVE CV LAB;  Service: Cardiovascular;  Laterality: N/A;   ESOPHAGOGASTRODUODENOSCOPY N/A 07/13/2022   Procedure: ESOPHAGOGASTRODUODENOSCOPY (EGD);  Surgeon: Jenel Lucks, MD;  Location: [facility] ENDOSCOPY;  Service: Gastroenterology;  Laterality: N/A;   LEFT HEART CATH AND CORONARY ANGIOGRAPHY N/A 05/20/2022   Procedure: LEFT HEART CATH AND CORONARY ANGIOGRAPHY;  Surgeon: Runell Gess, MD;  Location: [facility] INVASIVE CV LAB;  Service: Cardiovascular;  Laterality: N/A;   NO PAST SURGERIES      Social History: Social History   Socioeconomic History   Marital status: Married    Spouse name: Not on file   Number of children: 4   Years of education: Not on file   Highest education level: Not on file  Occupational History   Occupation: Maintenance Tech  Tobacco Use   Smoking status: Never   Smokeless tobacco: Never  Vaping Use   Vaping Use: Never used  Substance and Sexual Activity   Alcohol use: No   Drug use: No   Sexual activity: Yes    Partners: Female    Birth control/protection: None  Other Topics Concern   Not on file  Social History Narrative   Not on file   Social Determinants of Health   Financial Resource Strain: Not on file  Food Insecurity: No Food Insecurity (07/17/2022)   Hunger Vital Sign    Worried About Running Out of Food in the Last Year: Never true    Ran Out of Food in the Last Year: Never true  Transportation Needs: No Transportation Needs (07/17/2022)   PRAPARE - Scientist, research (physical sciences) (Medical): No    Lack of Transportation (Non-Medical): No  Physical Activity: Not on file  Stress: Not on file  Social Connections: Unknown (07/11/2022)   Social Connection and Isolation Panel [NHANES]    Frequency of Communication with Friends and Family: Patient declined    Frequency of Social Gatherings with Friends and Family: Patient declined    Attends Religious Services: Patient declined    Database administrator or Organizations: Patient declined    Attends Banker Meetings: Not on file    Marital Status: Patient declined  Intimate Partner Violence: Not At Risk (07/12/2022)   Humiliation, Afraid, Rape, and Kick questionnaire    Fear of Current or Ex-Partner: No    Emotionally Abused: No    Physically Abused: No    Sexually Abused: No    Family History: Family History  Problem Relation Age of Onset   Heart disease Mother    Breast cancer Maternal Grandmother        diagnosed late 50's   Breast cancer Maternal Aunt     Current Medications:  Current Outpatient Medications:    aspirin EC 81 MG tablet, Take 1 tablet (81 mg total) by mouth daily. Swallow whole. (Patient taking differently: Take 81 mg by mouth daily.), Disp: 90 tablet, Rfl: 1   atorvastatin (LIPITOR) 80 MG tablet, Take 1 tablet (80 mg total) by mouth daily., Disp: 90 tablet, Rfl: 3   carvedilol (COREG) 3.125 MG tablet, Take 1 tablet (3.125 mg total) by mouth 2 (two) times daily with a meal., Disp: 60 tablet, Rfl: 3   clopidogrel (PLAVIX) 75 MG tablet, Take 4 tablets at same time (300 mg) on day 1, then take 1  tablet by mouth daily thereafter, Disp: 94 tablet, Rfl: 3   Continuous Glucose Sensor (DEXCOM G7 SENSOR) MISC, Use as directed. Change every 10 days., Disp: 9 each, Rfl: 3   CYANOCOBALAMIN IJ, Inject 1 Dose as directed every 30 (thirty) days., Disp: , Rfl:    empagliflozin (JARDIANCE) 10 MG TABS tablet, Take 1 tablet (10 mg total) by mouth daily., Disp: 30 tablet, Rfl: 5    glipiZIDE (GLUCOTROL XL) 10 MG 24 hr tablet, Take 1 tablet (10 mg total) by mouth daily with breakfast., Disp: 90 tablet, Rfl: 3   Insulin NPH Isophane & Regular (NOVOLIN 70/30 FLEXPEN [place]), Inject 15 Units into the skin 2 (two) times daily., Disp: , Rfl:    Insulin Pen Needle (TRUEPLUS PEN NEEDLES) 31G X 5 MM MISC, USE AS DIRECTED FOUR TIMES DAILY, Disp: 200 each, Rfl: 3   midodrine (PROAMATINE) 10 MG tablet, Take 1 tablet (10 mg total) by mouth 3 (three) times daily., Disp: 90 tablet, Rfl: 1   omega-3 acid ethyl esters (LOVAZA) 1 g capsule, Take 2 capsules (2 g total) by mouth 2 (two) times daily., Disp: 360 capsule, Rfl: 1   Allergies: No Known Allergies  REVIEW OF SYSTEMS:   Review of Systems  Constitutional:  Negative for chills, fatigue and fever.  HENT:   Negative for lump/mass, mouth sores, nosebleeds, sore throat and trouble swallowing.   Eyes:  Negative for eye problems.  Respiratory:  Positive for cough and shortness of breath.   Cardiovascular:  Negative for chest pain, leg swelling and palpitations.  Gastrointestinal:  Positive for blood in stool and nausea. Negative for abdominal pain, constipation, diarrhea and vomiting.  Genitourinary:  Negative for bladder incontinence, difficulty urinating, dysuria, frequency, hematuria and nocturia.   Musculoskeletal:  Negative for arthralgias, back pain, flank pain, myalgias and neck pain.  Skin:  Negative for itching and rash.  Neurological:  Positive for dizziness. Negative for headaches and numbness.  Hematological:  Does not bruise/bleed easily.  Psychiatric/Behavioral:  Positive for sleep disturbance. Negative for depression and suicidal ideas. The patient is not nervous/anxious.   All other systems reviewed and are negative.    VITALS:   Blood pressure (!) 89/58, pulse 82, temperature (!) 97.5 F (36.4 C), temperature source Oral, resp. rate 17, height  (1.88 m), weight 260 lb 1.6 oz (118 kg), SpO2 100 %.  Wt Readings from  Last 3 Encounters:  07/24/22 260 lb 1.6 oz (118 kg)  07/11/22 265 lb 4 oz (120.3 kg)  06/06/22 267 lb 4 oz (121.2 kg)    Body mass index is 33.39 kg/m.   PHYSICAL EXAM:   Physical Exam Vitals and nursing note reviewed. Exam conducted with a chaperone present.  Constitutional:      Appearance: Normal appearance.  Cardiovascular:     Rate and Rhythm: Normal rate and regular rhythm.     Pulses: Normal pulses.     Heart sounds: Normal heart sounds.  Pulmonary:     Effort: Pulmonary effort is normal.     Breath sounds: Normal breath sounds.  Abdominal:     Palpations: Abdomen is soft. There is no hepatomegaly, splenomegaly or mass.     Tenderness: There is no abdominal tenderness.  Musculoskeletal:     Right lower leg: No edema.     Left lower leg: No edema.  Lymphadenopathy:     Cervical: No cervical adenopathy.     Right cervical: No superficial, deep or posterior cervical adenopathy.    Left cervical:  No superficial, deep or posterior cervical adenopathy.     Upper Body:     Right upper body: No supraclavicular or axillary adenopathy.     Left upper body: No supraclavicular or axillary adenopathy.  Neurological:     General: No focal deficit present.     Mental Status: He is alert and oriented to person, place, and time.  Psychiatric:        Mood and Affect: Mood normal.        Behavior: Behavior normal.     LABS:      Latest Ref Rng & Units 07/16/2022    5:00 AM 07/15/2022    4:17 AM 07/14/2022    4:21 AM  CBC  WBC 4.0 - 10.5 K/uL 6.4  6.6  6.7   Hemoglobin 13.0 - 17.0 g/dL 8.5  8.1  8.0   Hematocrit 39.0 - 52.0 % 25.3  24.4  25.1   Platelets 150 - 400 K/uL 259  254  226       Latest Ref Rng & Units 07/15/2022    4:17 AM 07/14/2022    4:21 AM 07/13/2022    2:33 AM  CMP  Glucose 70 - 99 mg/dL 914  782  956   BUN 6 - 20 mg/dL 14  15  23    Creatinine 0.61 - 1.24 mg/dL 2.13  0.86  5.78   Sodium 135 - 145 mmol/L 139  137  136   Potassium 3.5 - 5.1 mmol/L 3.7   3.8  3.8   Chloride 98 - 111 mmol/L 103  103  102   CO2 22 - 32 mmol/L 25  26  23    Calcium 8.9 - 10.3 mg/dL 8.6  8.4  8.6      No results found for: "CEA1", "CEA" / No results found for: "CEA1", "CEA" No results found for: "PSA1" No results found for: "ION629" No results found for: "CAN125"  No results found for: "TOTALPROTELP", "ALBUMINELP", "A1GS", "A2GS", "BETS", "BETA2SER", "GAMS", "MSPIKE", "SPEI" Lab Results  Component Value Date   FERRITIN 53.9 07/11/2022   No results found for: "LDH"   STUDIES:   CT ANGIO GI BLEED  Result Date: 07/12/2022 CLINICAL DATA:  Left lower quadrant abdominal pain, GI bleed. EXAM: CTA ABDOMEN AND PELVIS WITHOUT AND WITH CONTRAST TECHNIQUE: Multidetector CT imaging of the abdomen and pelvis was performed using the standard protocol during bolus administration of intravenous contrast. Multiplanar reconstructed images and MIPs were obtained and reviewed to evaluate the vascular anatomy. RADIATION DOSE REDUCTION: This exam was performed according to the departmental dose-optimization program which includes automated exposure control, adjustment of the mA and/or kV according to patient size and/or use of iterative reconstruction technique. CONTRAST:  OMNIPAQUE IOHEXOL 350 MG/ML SOLN COMPARISON:  None Available. FINDINGS: VASCULAR Aorta: Normal caliber aorta without aneurysm, dissection, vasculitis or significant stenosis. Minimal aortic atherosclerosis. Celiac: Patent without evidence of aneurysm, dissection, vasculitis or significant stenosis. SMA: Patent without evidence of aneurysm, dissection, vasculitis or significant stenosis. Renals: Both renal arteries are patent without evidence of aneurysm, dissection, vasculitis, fibromuscular dysplasia or significant stenosis. IMA: Patent. Inflow: Patent without evidence of aneurysm, dissection, vasculitis or significant stenosis. Proximal Outflow: Bilateral common femoral and visualized portions of the  superficial and profunda femoral arteries are patent without evidence of aneurysm, dissection, vasculitis or significant stenosis. Veins: No obvious venous abnormality. Review of the MIP images confirms the above findings. NON-VASCULAR Lower chest: The heart is normal in size and scattered coronary artery calcifications are noted.  Minimal atelectasis is present at the lung bases. Hepatobiliary: No focal liver abnormality is seen. No gallstones, gallbladder wall thickening, or biliary dilatation. Pancreas: Unremarkable. No pancreatic ductal dilatation or surrounding inflammatory changes. Spleen: Normal in size without focal abnormality. Adrenals/Urinary Tract: The adrenal glands are within normal limits. The kidneys enhance symmetrically. No renal calculus or hydronephrosis. The bladder is unremarkable. Stomach/Bowel: Stomach is within normal limits. Appendix appears normal. No evidence of bowel wall thickening, distention, or inflammatory changes. No free air or pneumatosis. A moderate amount of retained stool is present in the colon. No acute hemorrhage is seen. Lymphatic: No abdominal or pelvic lymphadenopathy. Reproductive: Prostate is unremarkable. Other: No abdominopelvic ascites. There is a small fat containing umbilical hernia. Musculoskeletal: No acute osseous abnormality. Mild degenerative changes are present in the thoracolumbar spine. IMPRESSION: VASCULAR 1. No evidence of acute hemorrhage. 2. Minimal aortic atherosclerosis. NON-VASCULAR 1. No acute process or evidence of acute hemorrhage. 2. Moderate amount of retained stool in the colon suggesting constipation. 3. Coronary artery calcifications. Electronically Signed   By: Thornell Sartorius M.D.   On: 07/12/2022 21:27

## 2022-07-24 NOTE — Patient Instructions (Signed)
[facility]  Discharge Instructions  You were seen and examined today by Dr. Katragadda. Dr. Katragadda is a hematologist, meaning that he specializes in blood abnormalities. Dr. Katragadda discussed your past medical history, family history of cancers/blood conditions and the events that led to you being here today.  You were referred to Dr. Katragadda due to Anemia.  Dr. Katragadda has recommended additional labs today for further evaluation.  Follow-up as scheduled.    Thank you for choosing  [facility] to provide your oncology and hematology care.   To afford each patient quality time with our provider, please arrive at least 15 minutes before your scheduled appointment time. You may need to reschedule your appointment if you arrive late (10 or more minutes). Arriving late affects you and other patients whose appointments are after yours.  Also, if you miss three or more appointments without notifying the office, you may be dismissed from the clinic at the provider's discretion.    Again, thank you for choosing [facility].  Our hope is that these requests will decrease the amount of time that you wait before being seen by our physicians.   If you have a lab appointment with the Cancer Center - please note that after April 8th, all labs will be drawn in the cancer center.  You do not have to check in or register with the main entrance as you have in the past but will complete your check-in at the cancer center.            _____________________________________________________________  Should you have questions after your visit to  Cancer Center, please contact our office at [phone] and follow the prompts.  Our office hours are 8:00 a.m. to 4:30 p.m. Monday - Thursday and 8:00 a.m. to 2:30 p.m. Friday.  Please note that voicemails left after 4:00 p.m. may not be returned until the following business day.  We  are closed weekends and all major holidays.  You do have access to a nurse 24-7, just call the main number to the clinic [phone] and do not press any options, hold on the line and a nurse will answer the phone.    For prescription refill requests, have your pharmacy contact our office and allow 72 hours.    Masks are no longer required in the cancer centers. If you would like for your care team to wear a mask while they are taking care of you, please let them know. You may have one support person who is at least 50 years old accompany you for your appointments.  

## 2022-07-24 NOTE — Telephone Encounter (Signed)
Pt sent this via mychart to the scheduling pool:    I can't stand no more then 5 min, I use my desk chair to move around in the house. No I haven't fallen lately. BP has been low when standing 70/30 once I sit it goes back up.      Good Morning Tyrail Can you tell me a little more about your dizziness?     Are you dizzy now?   Do you feel faint or have you passed out?   Do you have any other symptoms?   Have you checked your HR and BP (record if available)?       Appointment Request From: Micheal Neal  With Provider: Napoleon Form, Leodis Rains, NP [facility] [address]]  Preferred Date Range: 07/31/2022 - 08/05/2022  Preferred Times: Any Time  Reason for visit: Office Visit  Comments: Checkup with my heart and blood pressure. Still very dizzy

## 2022-07-24 NOTE — Telephone Encounter (Signed)
Patient stated for the past year he is experiencing dizziness., however, it was worsen the past couple of weeks. Pt was had OV 3/5 and was advised:   START Midodrine  take 1 tablet three times a day-while taking this medication monitor your blood pressure if your blood pressure gets over 140/80 it is too high and if you get readings this high consistently then contact the office   On 4/12 he was seen in the ED reported dizziness and started to noticed blood in his stool. Pt did have EGD on 4/13 and colonoscopy 4/15 looking for the source of bleeding. He has scheduled appointment with GI on 4/25. Patient stated he is currently taking midodrine as prescribed. He does take hs bp when he is symptomatic however, he does not record his blood pressure readings. Was advised to contact PCP and immediately seek medical  attention if  developing new or worsening symptoms.  Will forward to APP for advise.

## 2022-07-25 ENCOUNTER — Ambulatory Visit: Payer: 59 | Admitting: Gastroenterology

## 2022-07-25 ENCOUNTER — Other Ambulatory Visit: Payer: Self-pay

## 2022-07-26 ENCOUNTER — Inpatient Hospital Stay: Payer: 59

## 2022-07-26 LAB — PROTEIN ELECTROPHORESIS, SERUM
A/G Ratio: 1 (ref 0.7–1.7)
Albumin ELP: 3.9 g/dL (ref 2.9–4.4)
Alpha-1-Globulin: 0.3 g/dL (ref 0.0–0.4)
Alpha-2-Globulin: 0.9 g/dL (ref 0.4–1.0)
Beta Globulin: 1.3 g/dL (ref 0.7–1.3)
Gamma Globulin: 1.5 g/dL (ref 0.4–1.8)
Globulin, Total: 3.9 g/dL (ref 2.2–3.9)
Total Protein ELP: 7.8 g/dL (ref 6.0–8.5)

## 2022-07-26 LAB — COPPER, SERUM: Copper: 132 ug/dL (ref 69–132)

## 2022-07-29 ENCOUNTER — Other Ambulatory Visit (HOSPITAL_COMMUNITY): Payer: Self-pay

## 2022-07-29 ENCOUNTER — Inpatient Hospital Stay: Payer: 59

## 2022-07-29 ENCOUNTER — Other Ambulatory Visit: Payer: Self-pay

## 2022-07-29 VITALS — BP 117/64 | HR 88 | Temp 97.5°F | Resp 18 | Wt 261.3 lb

## 2022-07-29 DIAGNOSIS — I252 Old myocardial infarction: Secondary | ICD-10-CM | POA: Diagnosis not present

## 2022-07-29 DIAGNOSIS — Z7902 Long term (current) use of antithrombotics/antiplatelets: Secondary | ICD-10-CM | POA: Diagnosis not present

## 2022-07-29 DIAGNOSIS — D62 Acute posthemorrhagic anemia: Secondary | ICD-10-CM

## 2022-07-29 DIAGNOSIS — D649 Anemia, unspecified: Secondary | ICD-10-CM | POA: Diagnosis not present

## 2022-07-29 DIAGNOSIS — E538 Deficiency of other specified B group vitamins: Secondary | ICD-10-CM | POA: Diagnosis not present

## 2022-07-29 DIAGNOSIS — Z803 Family history of malignant neoplasm of breast: Secondary | ICD-10-CM | POA: Diagnosis not present

## 2022-07-29 MED ORDER — CETIRIZINE HCL 10 MG PO TABS
10.0000 mg | ORAL_TABLET | Freq: Once | ORAL | Status: AC
  Administered 2022-07-29: 10 mg via ORAL
  Filled 2022-07-29: qty 1

## 2022-07-29 MED ORDER — SODIUM CHLORIDE 0.9 % IV SOLN
50.0000 mg | Freq: Once | INTRAVENOUS | Status: AC
  Administered 2022-07-29: 50 mg via INTRAVENOUS
  Filled 2022-07-29: qty 1

## 2022-07-29 MED ORDER — SODIUM CHLORIDE 0.9 % IV SOLN: Freq: Once | INTRAVENOUS | Status: AC

## 2022-07-29 MED ORDER — ACETAMINOPHEN 325 MG PO TABS
650.0000 mg | ORAL_TABLET | Freq: Once | ORAL | Status: AC
  Administered 2022-07-29: 650 mg via ORAL
  Filled 2022-07-29: qty 2

## 2022-07-29 MED ORDER — FAMOTIDINE IN NACL 20-0.9 MG/50ML-% IV SOLN
20.0000 mg | Freq: Once | INTRAVENOUS | Status: AC
  Administered 2022-07-29: 20 mg via INTRAVENOUS
  Filled 2022-07-29: qty 50

## 2022-07-29 MED ORDER — METHYLPREDNISOLONE SODIUM SUCC 125 MG IJ SOLR
125.0000 mg | Freq: Once | INTRAMUSCULAR | Status: AC
  Administered 2022-07-29: 125 mg via INTRAVENOUS
  Filled 2022-07-29: qty 2

## 2022-07-29 MED ORDER — SODIUM CHLORIDE 0.9 % IV SOLN
950.0000 mg | Freq: Once | INTRAVENOUS | Status: AC
  Administered 2022-07-29: 950 mg via INTRAVENOUS
  Filled 2022-07-29: qty 19

## 2022-07-29 MED ORDER — SODIUM CHLORIDE 0.9 % IV SOLN
50.0000 mg | Freq: Once | INTRAVENOUS | Status: DC
  Filled 2022-07-29: qty 1

## 2022-07-29 NOTE — Progress Notes (Signed)
Patient presents today for iron infusion. Patient is in satisfactory condition with no new complaints voiced.  Vital signs are stable.  We will proceed with infusion per provider orders.    Peripheral IV started with good blood return pre and post infusion.  Infed 1,000 mg given today per MD orders. Tolerated infusion without adverse affects. Vital signs stable. No complaints at this time. Discharged from clinic ambulatory in stable condition. Alert and oriented x 3. F/U with [facility] as scheduled.   

## 2022-07-29 NOTE — Patient Instructions (Signed)
[facility]  Discharge Instructions: Thank you for choosing [facility] to provide your oncology and hematology care.  If you have a lab appointment with the Cancer Center - please note that after April 8th, 2024, all labs will be drawn in the cancer center.  You do not have to check in or register with the main entrance as you have in the past but will complete your check-in in the cancer center.  Wear comfortable clothing and clothing appropriate for easy access to any Portacath or PICC line.   We strive to give you quality time with your provider. You may need to reschedule your appointment if you arrive late (15 or more minutes).  Arriving late affects you and other patients whose appointments are after yours.  Also, if you miss three or more appointments without notifying the office, you may be dismissed from the clinic at the provider's discretion.      For prescription refill requests, have your pharmacy contact our office and allow 72 hours for refills to be completed.    Today you received Infed iron infusion.     BELOW ARE SYMPTOMS THAT SHOULD BE REPORTED IMMEDIATELY: *FEVER GREATER THAN 100.4 F (38 C) OR HIGHER *CHILLS OR SWEATING *NAUSEA AND VOMITING THAT IS NOT CONTROLLED WITH YOUR NAUSEA MEDICATION *UNUSUAL SHORTNESS OF BREATH *UNUSUAL BRUISING OR BLEEDING *URINARY PROBLEMS (pain or burning when urinating, or frequent urination) *BOWEL PROBLEMS (unusual diarrhea, constipation, pain near the anus) TENDERNESS IN MOUTH AND THROAT WITH OR WITHOUT PRESENCE OF ULCERS (sore throat, sores in mouth, or a toothache) UNUSUAL RASH, SWELLING OR PAIN  UNUSUAL VAGINAL DISCHARGE OR ITCHING   Items with * indicate a potential emergency and should be followed up as soon as possible or go to the Emergency Department if any problems should occur.  Please show the CHEMOTHERAPY ALERT CARD or IMMUNOTHERAPY ALERT CARD at check-in to the Emergency Department and  triage nurse.  Should you have questions after your visit or need to cancel or reschedule your appointment, please contact [facility] [phone]  and follow the prompts.  Office hours are 8:00 a.m. to 4:30 p.m. Monday - Friday. Please note that voicemails left after 4:00 p.m. may not be returned until the following business day.  We are closed weekends and major holidays. You have access to a nurse at all times for urgent questions. Please call the main number to the clinic [phone] and follow the prompts.  For any non-urgent questions, you may also contact your provider using MyChart. We now offer e-Visits for anyone 18 and older to request care online for non-urgent symptoms. For details visit [email or web address].   Also download the MyChart app! Go to the app store, search "MyChart", open the app, select [facility], and log in with your MyChart username and password.   

## 2022-07-30 LAB — METHYLMALONIC ACID, SERUM: Methylmalonic Acid, Quantitative: 205 nmol/L (ref 0–378)

## 2022-08-02 ENCOUNTER — Ambulatory Visit: Payer: 59 | Admitting: Nurse Practitioner

## 2022-08-02 ENCOUNTER — Encounter: Payer: Self-pay | Admitting: Hematology

## 2022-08-03 ENCOUNTER — Encounter: Payer: Self-pay | Admitting: Hematology

## 2022-08-06 ENCOUNTER — Telehealth: Payer: Self-pay

## 2022-08-06 NOTE — Patient Outreach (Signed)
  Care Coordination   08/06/2022 Name: Micheal Neal MRN: [id number] DOB: 1970/05/08   Care Coordination Outreach Attempts:  An unsuccessful telephone outreach was attempted for a scheduled appointment today.  Follow Up Plan:  Additional outreach attempts will be made to offer the patient care coordination information and services.   Encounter Outcome:  No Answer   Care Coordination Interventions:  No, not indicated    Kathyrn Sheriff, RN, MSN, BSN, CCM [facility] Care Coordinator [phone]

## 2022-08-07 ENCOUNTER — Other Ambulatory Visit (HOSPITAL_COMMUNITY): Payer: Self-pay

## 2022-08-09 ENCOUNTER — Ambulatory Visit (INDEPENDENT_AMBULATORY_CARE_PROVIDER_SITE_OTHER): Payer: 59 | Admitting: Nurse Practitioner

## 2022-08-09 VITALS — BP 102/66 | HR 90 | Temp 98.3°F | Ht 74.0 in | Wt 261.1 lb

## 2022-08-09 DIAGNOSIS — Z599 Problem related to housing and economic circumstances, unspecified: Secondary | ICD-10-CM | POA: Diagnosis not present

## 2022-08-09 DIAGNOSIS — E1165 Type 2 diabetes mellitus with hyperglycemia: Secondary | ICD-10-CM | POA: Diagnosis not present

## 2022-08-09 DIAGNOSIS — Z794 Long term (current) use of insulin: Secondary | ICD-10-CM

## 2022-08-09 DIAGNOSIS — E559 Vitamin D deficiency, unspecified: Secondary | ICD-10-CM

## 2022-08-09 DIAGNOSIS — R42 Dizziness and giddiness: Secondary | ICD-10-CM | POA: Diagnosis not present

## 2022-08-09 DIAGNOSIS — D62 Acute posthemorrhagic anemia: Secondary | ICD-10-CM

## 2022-08-09 LAB — BASIC METABOLIC PANEL
BUN: 21 mg/dL (ref 6–23)
CO2: 28 mEq/L (ref 19–32)
Calcium: 9.6 mg/dL (ref 8.4–10.5)
Chloride: 100 mEq/L (ref 96–112)
Creatinine, Ser: 1.3 mg/dL (ref 0.40–1.50)
GFR: 64.27 mL/min (ref >60.00)
Glucose, Bld: 218 mg/dL — ABNORMAL HIGH (ref 70–99)
Potassium: 4.4 mEq/L (ref 3.5–5.1)
Sodium: 136 mEq/L (ref 135–145)

## 2022-08-09 LAB — CBC
HCT: 38.5 % — ABNORMAL LOW (ref 39.0–52.0)
Hemoglobin: 12.8 g/dL — ABNORMAL LOW (ref 13.0–17.0)
MCHC: 33.4 g/dL (ref 30.0–36.0)
MCV: 87.9 fl (ref 78.0–100.0)
Platelets: 258 10*3/uL (ref 150.0–400.0)
RBC: 4.38 Mil/uL (ref 4.22–5.81)
RDW: 15.5 % (ref 11.5–15.5)
WBC: 6.1 10*3/uL (ref 4.0–10.5)

## 2022-08-09 LAB — IRON: Iron: 123 ug/dL (ref 42–165)

## 2022-08-09 LAB — VITAMIN D 25 HYDROXY (VIT D DEFICIENCY, FRACTURES): VITD: 20.05 ng/mL — ABNORMAL LOW (ref 30.00–100.00)

## 2022-08-09 NOTE — Assessment & Plan Note (Signed)
Acute, seems to have resolved Check CBC and iron levels, further recommendations may be made based upon the results

## 2022-08-09 NOTE — Progress Notes (Signed)
Established Patient Office Visit  Subjective   Patient ID: Micheal Neal, male    DOB: 03-27-1971  Age: 52 y.o. MRN: [id number]  Chief Complaint  Patient presents with   Medical Management of Chronic Issues    Patient was recently hospitalized for acute GI bleed, location of bleed was never identified.  Patient is Micheal Neal Witness so he did not except blood transfusion.  Has had iron transfusions.  Reports overall he is feeling a bit better but still experiences dizziness especially after standing up for more than a few minutes.  Has not passed out but has felt near syncope. Also has diabetes, reports he has had frequent low blood sugars recently.  Has been holding his insulin, is scheduled to see his endocrinologist later this month.  Last A1c 12.5.  He is also on atorvastatin.  Not currently on ACE or ARB.  On Jardiance 10 mg daily, glipizide 10 mg daily, and Novolin 70/30 15 units twice a day. Has history of vitamin D deficiency, is not currently taking vitamin D supplement.    Review of Systems  Respiratory:  Negative for cough and shortness of breath.   Cardiovascular:  Negative for chest pain and palpitations.  Neurological:  Positive for dizziness.      Objective:     BP 102/66   Pulse 90   Temp 98.3 F (36.8 C) (Temporal)   Ht 6\' 2"  (1.88 m)   Wt 261 lb 2 oz (118.4 kg)   SpO2 98%   BMI 33.53 kg/m  BP Readings from Last 3 Encounters:  08/09/22 102/66  07/29/22 117/64  07/24/22 (!) 89/58   Wt Readings from Last 3 Encounters:  08/09/22 261 lb 2 oz (118.4 kg)  07/29/22 261 lb 4.8 oz (118.5 kg)  07/24/22 260 lb 1.6 oz (118 kg)      Physical Exam Vitals reviewed.  Constitutional:      Appearance: Normal appearance.  HENT:     Head: Normocephalic and atraumatic.  Cardiovascular:     Rate and Rhythm: Normal rate and regular rhythm.  Pulmonary:     Effort: Pulmonary effort is normal.     Breath sounds: Normal breath sounds.  Musculoskeletal:     Cervical  back: Neck supple.  Skin:    General: Skin is warm and dry.  Neurological:     Mental Status: He is alert and oriented to person, place, and time.  Psychiatric:        Mood and Affect: Mood normal.        Behavior: Behavior normal.        Thought Content: Thought content normal.        Judgment: Judgment normal.      No results found for any visits on 08/09/22.     The ASCVD Risk score (Arnett DK, et al., 2019) failed to calculate for the following reasons:   The patient has a prior MI or stroke diagnosis    Assessment & Plan:   Problem List Items Addressed This Visit       Endocrine   Uncontrolled type 2 diabetes mellitus with hyperglycemia, with long-term current use of insulin (HCC)    Chronic Patient courage to follow-up with endocrinology as scheduled Continue Jardiance 10 mg daily, glipizide 10 mg daily, will reduce Novolin 70/30 to 10 units twice a day.  Patient encouraged to notify me if he experiences hypo or hyperglycemia. Continue statin therapy Will not initiate ACE or ARB due to his hypotension and dizziness.  Other   Dizziness    Chronic Etiology still unclear Patient encouraged to continue using compression stockings and abdominal binder as well as to continue taking midodrine and focus on hydration throughout the day. Patient to follow-up later next week for early morning cortisol and ACTH level for further evaluation. Patient encouraged to follow-up with cardiology as well      Relevant Orders   Cortisol   ACTH   Vitamin D deficiency    Chronic Check serum level today, further recommendations may be made based upon these results.      Relevant Orders   VITAMIN D 25 Hydroxy (Vit-D Deficiency, Fractures)   ABLA (acute blood loss anemia) - Primary    Acute, seems to have resolved Check CBC and iron levels, further recommendations may be made based upon the results      Relevant Orders   CBC   Basic metabolic panel   Iron   Iron,  TIBC and Ferritin Panel    Return in about 6 weeks (around 09/20/2022) for F/U with Satchel Heidinger.    [name], NP

## 2022-08-09 NOTE — Patient Instructions (Signed)
Follow up with cardiology

## 2022-08-09 NOTE — Assessment & Plan Note (Signed)
Chronic Etiology still unclear Patient encouraged to continue using compression stockings and abdominal binder as well as to continue taking midodrine and focus on hydration throughout the day. Patient to follow-up later next week for early morning cortisol and ACTH level for further evaluation. Patient encouraged to follow-up with cardiology as well

## 2022-08-09 NOTE — Assessment & Plan Note (Signed)
Chronic Patient courage to follow-up with endocrinology as scheduled Continue Jardiance 10 mg daily, glipizide 10 mg daily, will reduce Novolin 70/30 to 10 units twice a day.  Patient encouraged to notify me if he experiences hypo or hyperglycemia. Continue statin therapy Will not initiate ACE or ARB due to his hypotension and dizziness.

## 2022-08-09 NOTE — Assessment & Plan Note (Signed)
Chronic Check serum level today, further recommendations may be made based upon these results.

## 2022-08-10 LAB — IRON,TIBC AND FERRITIN PANEL
%SAT: 31 % (calc) (ref 20–48)
Ferritin: 198 ng/mL (ref 38–380)
Iron: 122 ug/dL (ref 50–180)
TIBC: 389 mcg/dL (calc) (ref 250–425)

## 2022-08-12 ENCOUNTER — Other Ambulatory Visit (INDEPENDENT_AMBULATORY_CARE_PROVIDER_SITE_OTHER): Payer: 59

## 2022-08-12 ENCOUNTER — Other Ambulatory Visit: Payer: Self-pay

## 2022-08-12 ENCOUNTER — Other Ambulatory Visit: Payer: Self-pay | Admitting: Nurse Practitioner

## 2022-08-12 ENCOUNTER — Other Ambulatory Visit (HOSPITAL_COMMUNITY): Payer: Self-pay

## 2022-08-12 DIAGNOSIS — E559 Vitamin D deficiency, unspecified: Secondary | ICD-10-CM

## 2022-08-12 DIAGNOSIS — R42 Dizziness and giddiness: Secondary | ICD-10-CM

## 2022-08-12 DIAGNOSIS — E1165 Type 2 diabetes mellitus with hyperglycemia: Secondary | ICD-10-CM

## 2022-08-12 LAB — CORTISOL: Cortisol, Plasma: 16.1 ug/dL

## 2022-08-12 MED ORDER — VITAMIN D (ERGOCALCIFEROL) 1.25 MG (50000 UNIT) PO CAPS
50000.0000 [IU] | ORAL_CAPSULE | ORAL | 2 refills | Status: DC
Start: 2022-08-12 — End: 2022-10-12
  Filled 2022-08-12: qty 4, 28d supply, fill #0
  Filled 2022-09-02 – 2022-09-05 (×2): qty 4, 28d supply, fill #1
  Filled 2022-09-30 – 2022-10-11 (×2): qty 4, 28d supply, fill #2

## 2022-08-12 MED ORDER — NOVOLIN 70/30 FLEXPEN (70-30) 100 UNIT/ML ~~LOC~~ SUPN
PEN_INJECTOR | SUBCUTANEOUS | 0 refills | Status: DC
Start: 2022-08-12 — End: 2023-02-06
  Filled 2022-08-12: qty 6, 30d supply, fill #0
  Filled 2022-09-05: qty 6, 30d supply, fill #1
  Filled 2022-10-06: qty 6, 30d supply, fill #2
  Filled 2022-11-05: qty 6, 30d supply, fill #3
  Filled 2022-11-15 – 2022-12-05 (×3): qty 6, 30d supply, fill #4
  Filled 2023-01-09: qty 6, 30d supply, fill #5

## 2022-08-12 NOTE — Addendum Note (Signed)
Addended by: [name] on: 08/12/2022 06:14 AM   Modules accepted: Orders

## 2022-08-15 ENCOUNTER — Other Ambulatory Visit: Payer: Self-pay

## 2022-08-15 ENCOUNTER — Ambulatory Visit (INDEPENDENT_AMBULATORY_CARE_PROVIDER_SITE_OTHER): Payer: 59 | Admitting: Urology

## 2022-08-15 ENCOUNTER — Encounter: Payer: Self-pay | Admitting: Urology

## 2022-08-15 VITALS — BP 117/74 | HR 81

## 2022-08-15 DIAGNOSIS — N5201 Erectile dysfunction due to arterial insufficiency: Secondary | ICD-10-CM | POA: Diagnosis not present

## 2022-08-15 DIAGNOSIS — E291 Testicular hypofunction: Secondary | ICD-10-CM | POA: Diagnosis not present

## 2022-08-15 LAB — URINALYSIS, ROUTINE W REFLEX MICROSCOPIC
Bilirubin, UA: NEGATIVE
Ketones, UA: NEGATIVE
Leukocytes,UA: NEGATIVE
Nitrite, UA: NEGATIVE
Protein,UA: NEGATIVE
RBC, UA: NEGATIVE
Specific Gravity, UA: 1.02 (ref 1.005–1.030)
Urobilinogen, Ur: 0.2 mg/dL (ref 0.2–1.0)
pH, UA: 5.5 (ref 5.0–7.5)

## 2022-08-15 LAB — ACTH: C206 ACTH: 43 pg/mL (ref 6–50)

## 2022-08-15 MED ORDER — SILDENAFIL CITRATE 100 MG PO TABS: ORAL_TABLET | ORAL | 11 refills | Status: DC

## 2022-08-15 MED ORDER — TADALAFIL 20 MG PO TABS: ORAL_TABLET | ORAL | 11 refills | Status: DC

## 2022-08-15 NOTE — Addendum Note (Signed)
Addended by: [name] on: 08/15/2022 07:49 AM   Modules accepted: Orders

## 2022-08-15 NOTE — Progress Notes (Signed)
Subjective: 1. Erectile dysfunction due to arterial insufficiency   2. Hypogonadism in male      Consult requested by Micheal Prows NP   Micheal Neal is a 52 yo male who presents for evaluation of ED.  He reports progressive issues over the past year.  He is not able to get any tumescence.  He can reach a climax but has no ejaculate.  He has a good libido.  He has had a history of low T and elevated estradiol and gynecomastia on the right.  He has intermittent pain and firmness on the right.  He has had no prior treatment except cialis which didn't work.  He has lost 12 lb since February after an MI.  He had a stent placed.   He has neuropathy.  He is voiding well with an IPSS of 5 and nocturia x 3.  He is on Plavix but no NTG.  ROS:  Review of Systems  All other systems reviewed and are negative.   No Known Allergies  Past Medical History:  Diagnosis Date   Abnormal EKG    Anemia 07/11/2022   Contusion of left foot    Diabetes mellitus without complication (HCC)    Dizziness    Encounter for hepatitis C screening test for low risk patient 07/11/2022   GERD (gastroesophageal reflux disease)    Near syncope    Palpitations    Vertigo     Past Surgical History:  Procedure Laterality Date   BIOPSY  07/13/2022   Procedure: BIOPSY;  Surgeon: Jenel Lucks, MD;  Location[facility] ENDOSCOPY;  Service: Gastroenterology;;   BIOPSY  07/15/2022   Procedure: BIOPSY;  Surgeon: Beverley Fiedler, MD;  Location: [facility] ENDOSCOPY;  Service: Gastroenterology;;   COLONOSCOPY N/A 07/15/2022   Procedure: COLONOSCOPY;  Surgeon: Beverley Fiedler, MD;  Location[facility] ENDOSCOPY;  Service: Gastroenterology;  Laterality: N/A;   CORONARY STENT INTERVENTION N/A 05/20/2022   Procedure: CORONARY STENT INTERVENTION;  Surgeon: Runell Gess, MD;  Location: [facility] INVASIVE CV LAB;  Service: Cardiovascular;  Laterality: N/A;   ESOPHAGOGASTRODUODENOSCOPY N/A 07/13/2022   Procedure: ESOPHAGOGASTRODUODENOSCOPY (EGD);  Surgeon:  Jenel Lucks, MD;  Location: [facility] ENDOSCOPY;  Service: Gastroenterology;  Laterality: N/A;   LEFT HEART CATH AND CORONARY ANGIOGRAPHY N/A 05/20/2022   Procedure: LEFT HEART CATH AND CORONARY ANGIOGRAPHY;  Surgeon: Runell Gess, MD;  Location: [facility] INVASIVE CV LAB;  Service: Cardiovascular;  Laterality: N/A;   NO PAST SURGERIES      Social History   Socioeconomic History   Marital status: Married    Spouse name: Not on file   Number of children: 4   Years of education: Not on file   Highest education level: Not on file  Occupational History   Occupation: Maintenance Tech  Tobacco Use   Smoking status: Never   Smokeless tobacco: Never  Vaping Use   Vaping Use: Never used  Substance and Sexual Activity   Alcohol use: No   Drug use: No   Sexual activity: Yes    Partners: Female    Birth control/protection: None  Other Topics Concern   Not on file  Social History Narrative   Not on file   Social Determinants of Health   Financial Resource Strain: Not on file  Food Insecurity: No Food Insecurity (07/17/2022)   Hunger Vital Sign    Worried About Running Out of Food in the Last Year: Never true    Ran Out of Food in the Last Year: Never  true  Transportation Needs: No Transportation Needs (07/17/2022)   PRAPARE - Administrator, Civil Service (Medical): No    Lack of Transportation (Non-Medical): No  Physical Activity: Not on file  Stress: Not on file  Social Connections: Unknown (07/11/2022)   Social Connection and Isolation Panel [NHANES]    Frequency of Communication with Friends and Family: Patient declined    Frequency of Social Gatherings with Friends and Family: Patient declined    Attends Religious Services: Patient declined    Database administrator or Organizations: Patient declined    Attends Banker Meetings: Not on file    Marital Status: Patient declined  Intimate Partner Violence: Not At Risk (07/12/2022)   Humiliation, Afraid,  Rape, and Kick questionnaire    Fear of Current or Ex-Partner: No    Emotionally Abused: No    Physically Abused: No    Sexually Abused: No    Family History  Problem Relation Age of Onset   Heart disease Mother    Breast cancer Maternal Grandmother        diagnosed late 67's   Breast cancer Maternal Aunt     Anti-infectives: Anti-infectives (From admission, onward)    None       Current Outpatient Medications  Medication Sig Dispense Refill   aspirin EC 81 MG tablet Take 1 tablet (81 mg total) by mouth daily. Swallow whole. (Patient taking differently: Take 81 mg by mouth daily.) 90 tablet 1   atorvastatin (LIPITOR) 80 MG tablet Take 1 tablet (80 mg total) by mouth daily. 90 tablet 3   carvedilol (COREG) 3.125 MG tablet Take 1 tablet (3.125 mg total) by mouth 2 (two) times daily with a meal. 60 tablet 3   clopidogrel (PLAVIX) 75 MG tablet Take 4 tablets at same time (300 mg) on day 1, then take 1 tablet by mouth daily thereafter 94 tablet 3   Continuous Glucose Sensor (DEXCOM G7 SENSOR) MISC Use as directed. Change every 10 days. 9 each 3   CYANOCOBALAMIN IJ Inject 1 Dose as directed every 30 (thirty) days.     empagliflozin (JARDIANCE) 10 MG TABS tablet Take 1 tablet (10 mg total) by mouth daily. 30 tablet 5   glipiZIDE (GLUCOTROL XL) 10 MG 24 hr tablet Take 1 tablet (10 mg total) by mouth daily with breakfast. 90 tablet 3   insulin isophane & regular human KwikPen (NOVOLIN 70/30 KWIKPEN) (70-30) 100 UNIT/ML KwikPen Inject 10 units twice a day 45 mL 0   Insulin NPH Isophane & Regular (NOVOLIN 70/30 FLEXPEN [place]) Inject 10 Units into the skin 2 (two) times daily.     Insulin Pen Needle (TRUEPLUS PEN NEEDLES) 31G X 5 MM MISC USE AS DIRECTED FOUR TIMES DAILY 200 each 3   midodrine (PROAMATINE) 10 MG tablet Take 1 tablet (10 mg total) by mouth 3 (three) times daily. 90 tablet 1   omega-3 acid ethyl esters (LOVAZA) 1 g capsule Take 2 capsules (2 g total) by mouth 2 (two) times daily.  360 capsule 1   sildenafil (VIAGRA) 100 MG tablet Take either 1 tab po prn or 1/2 tab po prn with 1/2 tab of tadalafil. 10 tablet 11   tadalafil (CIALIS) 20 MG tablet 1 tab po prn or 1/2 tab po with 1/2 tab of sildenafil 100mg . 10 tablet 11   Vitamin D, Ergocalciferol, (DRISDOL) 1.25 MG (50000 UNIT) CAPS capsule Take 1 capsule (50,000 Units total) by mouth every 7 (seven) days. 4 capsule 2  No current facility-administered medications for this visit.     Objective: Vital signs in last 24 hours: BP 117/74   Pulse 81   Intake/Output from previous day: No intake/output data recorded. Intake/Output this shift: @IOTHISSHIFT @   Physical Exam  Lab Results:  Results for orders placed or performed in visit on 08/15/22 (from the past 24 hour(s))  Testosterone Free, Profile I     Status: Abnormal   Collection Time: 08/15/22 12:55 PM  Result Value Ref Range   Albumin 4.5 4.1 - 5.1 g/dL   Testosterone 161 (L) 264 - 916 ng/dL   Sex Hormone Binding 09.6 19.3 - 76.4 nmol/L   Testost., Free, Calc 47.7 30.3 - 183.2 pg/mL   Narrative   Performed at:  [address] [place], [place]  [id number] Lab Director: Jolene Schimke MD, Phone:  [phone]  Estradiol     Status: None   Collection Time: 08/15/22 12:55 PM  Result Value Ref Range   Estradiol 22.1 7.6 - 42.6 pg/mL   Narrative   Performed at:  [address] [facility] [address], [place], [place]  [id number] Lab Director: Jolene Schimke MD, Phone:  [phone]    BMET No results for input(s): "NA", "K", "CL", "CO2", "GLUCOSE", "BUN", "CREATININE", "CALCIUM" in the last 72 hours. PT/INR No results for input(s): "LABPROT", "INR" in the last 72 hours. ABG No results for input(s): "PHART", "HCO3" in the last 72 hours.  Invalid input(s): "PCO2", "PO2" UA has 3+ glucose.  Recent labs reviewed including testosterone panel.  Studies/Results: No results found.   Assessment/Plan: ED.  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   I am going to try him on sildenafil 100mg  and if that isn't effect, sildenafil 50mg  with tadalafil 10mg  in combination.  I reviewed the instructions and side effect in detail.  I also discussed injection therapy, VED and IPP.     Hypogonadism.  His recent T was borderline low with a low free T and a history of an elevated estradiol with gynecomastia.   I will repeat a T panel and estradiol today.  Prior LH and prolactin were normal.   Meds ordered this encounter  Medications   sildenafil (VIAGRA) 100 MG tablet    Sig: Take either 1 tab po prn or 1/2 tab po prn with 1/2 tab of tadalafil.    Dispense:  10 tablet    Refill:  11   tadalafil (CIALIS) 20 MG tablet    Sig: 1 tab po prn or 1/2 tab po with 1/2 tab of sildenafil 100mg .    Dispense:  10 tablet    Refill:  11     Orders Placed This Encounter  Procedures   Testosterone Free, Profile I   Estradiol   Urinalysis, Routine w reflex microscopic     Return in about 3 months (around 11/15/2022).    CC: Micheal Prows NP     Bjorn Pippin 08/16/2022

## 2022-08-16 ENCOUNTER — Telehealth: Payer: Self-pay

## 2022-08-16 LAB — TESTOSTERONE FREE, PROFILE I
Albumin: 4.5 g/dL (ref 4.1–5.1)
Sex Hormone Binding: 25.2 nmol/L (ref 19.3–76.4)
Testost., Free, Calc: 47.7 pg/mL (ref 30.3–183.2)
Testosterone: 220 ng/dL — ABNORMAL LOW (ref 264–916)

## 2022-08-16 LAB — ESTRADIOL: Estradiol: 22.1 pg/mL (ref 7.6–42.6)

## 2022-08-16 NOTE — Telephone Encounter (Signed)
   Telephone encounter was:  Unsuccessful.  08/16/2022 Name: Micheal Neal MRN: [id number] DOB: 01-Jun-1970  Unsuccessful outbound call made today to assist with:   financial resources.  Outreach Attempt:  1st Attempt  A HIPAA compliant voice message was left requesting a return call.  Instructed patient to call back at [phone].  Micheal Neal [facility]  [facility] Population Health Community Resource Care Guide   [email or web address]  ?? [phone]   Website: [email or web address]  [email or web address]

## 2022-08-19 ENCOUNTER — Telehealth: Payer: Self-pay

## 2022-08-19 NOTE — Telephone Encounter (Signed)
   Telephone encounter was:  Unsuccessful.  08/19/2022 Name: Micheal Neal MRN: [id number] DOB: [birth year]  Unsuccessful outbound call made today to assist with:   financial resources.  Outreach Attempt:  2nd Attempt  A HIPAA compliant voice message was left requesting a return call.  Instructed patient to call back at [phone].  Lynise Porr [facility]  [facility] Population Health Community Resource Care Guide   [email or web address]  ?? [phone]   Website: [email or web address]  [email or web address]

## 2022-08-20 ENCOUNTER — Telehealth: Payer: Self-pay

## 2022-08-20 ENCOUNTER — Other Ambulatory Visit (HOSPITAL_COMMUNITY): Payer: Self-pay

## 2022-08-20 NOTE — Telephone Encounter (Signed)
Yes, patient would like to proceed with testosterone replacement therapy.

## 2022-08-20 NOTE — Telephone Encounter (Signed)
-----   Message from Bjorn Pippin, MD sent at 08/19/2022  9:15 PM EDT ----- His total T is low at 220 and the free testosterone is borderline low.  His estradiol level is normal.  We could consider another trial of testosterone replacement therapy if he is interested.   Let me know if he would like to try something.   ----- Message ----- From: Grier Rocher, CMA Sent: 08/16/2022   7:51 AM EDT To: Bjorn Pippin, MD  Please review

## 2022-08-20 NOTE — Telephone Encounter (Signed)
   Telephone encounter was:  Unsuccessful.  08/20/2022 Name: Micheal Neal MRN: [id number] DOB: 1970/07/25  Unsuccessful outbound call made today to assist with:   financial strain.  Outreach Attempt:  3rd Attempt.  Referral closed unable to contact patient.  Unable to leave message voicemail did not pickup.  [name]  [facility] Population Health Community Resource Care Guide   [email or web address]  ?? [phone]   Website: [email or web address]  [email or web address]

## 2022-08-21 ENCOUNTER — Other Ambulatory Visit: Payer: Self-pay | Admitting: Urology

## 2022-08-21 DIAGNOSIS — E291 Testicular hypofunction: Secondary | ICD-10-CM

## 2022-08-22 ENCOUNTER — Ambulatory Visit: Payer: 59 | Admitting: Cardiovascular Disease

## 2022-08-28 ENCOUNTER — Other Ambulatory Visit (HOSPITAL_COMMUNITY): Payer: Self-pay

## 2022-08-30 ENCOUNTER — Ambulatory Visit (INDEPENDENT_AMBULATORY_CARE_PROVIDER_SITE_OTHER): Payer: 59 | Admitting: Internal Medicine

## 2022-08-30 ENCOUNTER — Encounter: Payer: Self-pay | Admitting: Urology

## 2022-08-30 ENCOUNTER — Other Ambulatory Visit: Payer: Self-pay

## 2022-08-30 ENCOUNTER — Encounter: Payer: Self-pay | Admitting: Internal Medicine

## 2022-08-30 ENCOUNTER — Other Ambulatory Visit (HOSPITAL_COMMUNITY): Payer: Self-pay

## 2022-08-30 ENCOUNTER — Encounter: Payer: Self-pay | Admitting: Hematology

## 2022-08-30 VITALS — BP 132/78 | HR 72 | Ht 74.0 in | Wt 260.6 lb

## 2022-08-30 DIAGNOSIS — Z794 Long term (current) use of insulin: Secondary | ICD-10-CM | POA: Diagnosis not present

## 2022-08-30 DIAGNOSIS — E1142 Type 2 diabetes mellitus with diabetic polyneuropathy: Secondary | ICD-10-CM

## 2022-08-30 DIAGNOSIS — E1159 Type 2 diabetes mellitus with other circulatory complications: Secondary | ICD-10-CM

## 2022-08-30 LAB — POCT GLYCOSYLATED HEMOGLOBIN (HGB A1C): Hemoglobin A1C: 6.4 % — AB (ref 4.0–5.6)

## 2022-08-30 MED ORDER — SEMAGLUTIDE(0.25 OR 0.5MG/DOS) 2 MG/3ML ~~LOC~~ SOPN
0.5000 mg | PEN_INJECTOR | SUBCUTANEOUS | 11 refills | Status: DC
  Filled 2022-08-30 – 2022-10-14 (×3): qty 3, 28d supply, fill #0

## 2022-08-30 MED ORDER — EMPAGLIFLOZIN 10 MG PO TABS
10.0000 mg | ORAL_TABLET | Freq: Every day | ORAL | 11 refills | Status: DC
  Filled 2022-08-30 – 2022-09-23 (×2): qty 30, 30d supply, fill #0
  Filled 2022-10-20: qty 30, 30d supply, fill #1
  Filled 2022-11-15: qty 30, 30d supply, fill #2
  Filled 2022-11-29 – 2022-12-18 (×2): qty 30, 30d supply, fill #3
  Filled 2023-01-25: qty 30, 30d supply, fill #4

## 2022-08-30 NOTE — Progress Notes (Signed)
Name: Micheal Neal  Age/ Sex: 52 y.o., male   MRN/ DOB: [id number], Apr 24, 1970     PCP: [name], NP   Reason for Endocrinology Evaluation: Type 2 Diabetes Mellitus  Initial Endocrine Consultative Visit: 05/31/2022    PATIENT IDENTIFIER: Micheal Neal is a 52 y.o. male with a past medical history of CAD (s/p PCI 05/2022) , DM, orthostatic hypotension. The patient has followed with Endocrinology clinic since 05/31/2022 for consultative assistance with management of his diabetes.  DIABETIC HISTORY:  Micheal Neal was diagnosed with DM 2012, he has been on metformin in the past but developed GI upset , he has been on insulin since his diagnosis . His hemoglobin A1c has ranged from 8.7% in 2019, peaking at 12.5% in 2024.   GAD-65 < 1 in 2012  He was always on basal/prandial insulin but due to cost issues he was switched to insulin mix during hospitalization for MI in 05/2022 Glipizide was started during hospitalization 05/2022  Mother with CHF    On his initial visit to our clinic 05/2022 I started him on Jardiance  SUBJECTIVE:     Today (08/30/2022): Micheal Neal is here for diabetes management.  He  is accompanied by his wife . He checks his blood sugars multiple  times daily. The patient has had hypoglycemic episodes since the last clinic visit.  Patient under the care of cardiology for CAD as well as orthostatic hypotension, he is on midodrine Cortisol was normal per PCP's testing  Presented to the ED 07/2022 with black tarry stools He was seen by podiatry 06/06/2022 Denies nausea, vomiting or diarrhea   Denies UTI but feels dehydrated  Due to hypoglycemia, he stopped taking his evening insulin, he reduced morning dose of insulin, he also switch taking Jardiance today afternoon and continue to take glipizide in the morning  HOME DIABETES REGIMEN:  Jardiance 10 mg daily Glipizide 10 mg XL, 1 tablet before breakfast Novolin mix 25 units before breakfast and 20 units before  supper-has been taking 20 units with breakfast only, none at dinner    Statin: Yes ACE-I/ARB: no   CONTINUOUS GLUCOSE MONITORING RECORD INTERPRETATION    Dates of Recording: 5/18-5/31/2024  Sensor description:dexcom  Results statistics:   CGM use % of time 71  Average and SD 183/48  Time in range  54 %  % Time Above 180 35  % Time above 250 10  % Time Below target <1   Glycemic patterns summary: BG's are optimal overnight, and trend up during the day  Hyperglycemic episodes postprandial  Hypoglycemic episodes occurred not in the past 2 weeks  Overnight periods: high      DIABETIC COMPLICATIONS: Microvascular complications:  Neuropathy  Denies: CKD Last Eye Exam: scheduled next week  Macrovascular complications:  CAD Denies:  CVA, PVD   HISTORY:  Past Medical History:  Past Medical History:  Diagnosis Date   Abnormal EKG    Anemia 07/11/2022   Contusion of left foot    Diabetes mellitus without complication (HCC)    Dizziness    Encounter for hepatitis C screening test for low risk patient 07/11/2022   GERD (gastroesophageal reflux disease)    Near syncope    Palpitations    Vertigo    Past Surgical History:  Past Surgical History:  Procedure Laterality Date   BIOPSY  07/13/2022   Procedure: BIOPSY;  Surgeon: Jenel Lucks, MD;  Location: [facility] ENDOSCOPY;  Service: Gastroenterology;;   BIOPSY  07/15/2022   Procedure: BIOPSY;  Surgeon: Beverley Fiedler, MD;  Location: [facility] ENDOSCOPY;  Service: Gastroenterology;;   COLONOSCOPY N/A 07/15/2022   Procedure: COLONOSCOPY;  Surgeon: Beverley Fiedler, MD;  Location: [facility] ENDOSCOPY;  Service: Gastroenterology;  Laterality: N/A;   CORONARY STENT INTERVENTION N/A 05/20/2022   Procedure: CORONARY STENT INTERVENTION;  Surgeon: Runell Gess, MD;  Location: [facility] INVASIVE CV LAB;  Service: Cardiovascular;  Laterality: N/A;   ESOPHAGOGASTRODUODENOSCOPY N/A 07/13/2022   Procedure: ESOPHAGOGASTRODUODENOSCOPY (EGD);   Surgeon: Jenel Lucks, MD;  Location: [facility] ENDOSCOPY;  Service: Gastroenterology;  Laterality: N/A;   LEFT HEART CATH AND CORONARY ANGIOGRAPHY N/A 05/20/2022   Procedure: LEFT HEART CATH AND CORONARY ANGIOGRAPHY;  Surgeon: Runell Gess, MD;  Location: [facility] INVASIVE CV LAB;  Service: Cardiovascular;  Laterality: N/A;   NO PAST SURGERIES     Social History:  reports that he has never smoked. He has never used smokeless tobacco. He reports that he does not drink alcohol and does not use drugs. Family History:  Family History  Problem Relation Age of Onset   Heart disease Mother    Breast cancer Maternal Grandmother        diagnosed late 10's   Breast cancer Maternal Aunt      HOME MEDICATIONS: Allergies as of 08/30/2022   No Known Allergies      Medication List        Accurate as of Aug 30, 2022 12:27 PM. If you have any questions, ask your nurse or doctor.          aspirin EC 81 MG tablet Take 1 tablet (81 mg total) by mouth daily. Swallow whole. What changed: additional instructions   atorvastatin 80 MG tablet Commonly known as: LIPITOR Take 1 tablet (80 mg total) by mouth daily.   carvedilol 3.125 MG tablet Commonly known as: COREG Take 1 tablet (3.125 mg total) by mouth 2 (two) times daily with a meal.   clopidogrel 75 MG tablet Commonly known as: PLAVIX Take 4 tablets at same time (300 mg) on day 1, then take 1 tablet by mouth daily thereafter   CYANOCOBALAMIN IJ Inject 1 Dose as directed every 30 (thirty) days.   Dexcom G7 Sensor Misc Use as directed. Change every 10 days.   glipiZIDE 10 MG 24 hr tablet Commonly known as: Glucotrol XL Take 1 tablet (10 mg total) by mouth daily with breakfast.   Jardiance 10 MG Tabs tablet Generic drug: empagliflozin Take 1 tablet (10 mg total) by mouth daily.   midodrine 10 MG tablet Commonly known as: PROAMATINE Take 1 tablet (10 mg total) by mouth 3 (three) times daily.   NOVOLIN 70/30 FLEXPEN [place] Inject  10 Units into the skin 2 (two) times daily.   NovoLIN 70/30 Kwikpen (70-30) 100 UNIT/ML KwikPen Generic drug: insulin isophane & regular human KwikPen Inject 10 units twice a day   omega-3 acid ethyl esters 1 g capsule Commonly known as: LOVAZA Take 2 capsules (2 g total) by mouth 2 (two) times daily.   sildenafil 100 MG tablet Commonly known as: VIAGRA Take either 1 tab po prn or 1/2 tab po prn with 1/2 tab of tadalafil.   tadalafil 20 MG tablet Commonly known as: CIALIS 1 tab po prn or 1/2 tab po with 1/2 tab of sildenafil 100mg .   TRUEplus Pen Needles 31G X 5 MM Misc Generic drug: Insulin Pen Needle USE AS DIRECTED FOUR TIMES DAILY   Vitamin D (Ergocalciferol) 1.25 MG (50000 UNIT) Caps capsule Commonly known as: DRISDOL  Take 1 capsule (50,000 Units total) by mouth every 7 (seven) days.         OBJECTIVE:   Vital Signs: BP 132/78 (BP Location: Right Arm, Patient Position: Sitting, Cuff Size: Normal)   Pulse 72   Ht 6\' 2"  (1.88 m)   Wt 260 lb 9.6 oz (118.2 kg)   SpO2 99%   BMI 33.46 kg/m   Wt Readings from Last 3 Encounters:  08/30/22 260 lb 9.6 oz (118.2 kg)  08/09/22 261 lb 2 oz (118.4 kg)  07/29/22 261 lb 4.8 oz (118.5 kg)     Exam: General: Pt appears well and is in NAD  Neck: General: Supple without adenopathy. Thyroid: Thyroid size normal.  No goiter or nodules appreciated.   Lungs: Clear with good BS bilat   Heart: RRR   Abdomen:  soft, nontender  Extremities: No pretibial edema.   Neuro: MS is good with appropriate affect, pt is alert and Ox3    DM foot exam: 05/31/2022  The skin of the feet is without sores or ulcerations, discolored toe noted  The pedal pulses are undetectable  The sensation is absent  to a screening 5.07, 10 gram monofilament bilaterally          DATA REVIEWED:  Lab Results  Component Value Date   HGBA1C 6.4 (A) 08/30/2022   HGBA1C 12.5 (H) 05/19/2022   HGBA1C 10.3 (H) 11/06/2021    Latest Reference Range & Units  05/28/22 04:00  Sodium 135 - 145 mmol/L 137  Potassium 3.5 - 5.1 mmol/L 3.9  Chloride 98 - 111 mmol/L 106  CO2 22 - 32 mmol/L 25  Glucose 70 - 99 mg/dL 161 (H)  BUN 6 - 20 mg/dL 17  Creatinine 0.96 - 0.45 mg/dL 4.09  Calcium 8.9 - 81.1 mg/dL 8.3 (L)  Anion gap 5 - 15  6  Alkaline Phosphatase 38 - 126 U/L 64  Albumin 3.5 - 5.0 g/dL 3.2 (L)  AST 15 - 41 U/L 26  ALT 0 - 44 U/L 27  Total Protein 6.5 - 8.1 g/dL 6.9  Total Bilirubin 0.3 - 1.2 mg/dL 0.9  GFR, Estimated >91 mL/min >60   ASSESSMENT / PLAN / RECOMMENDATIONS:   1) Type 2 Diabetes Mellitus, optimally controlled, With Neuropathic and  macrovascular complications - Most recent A1c of 6.4 %. Goal A1c < 7.0 %.    -I have praised the patient improved glycemic control, his A1c has trended down from 12.5% to 6.4% -Intolerant to metformin -Due to hypoglycemia, patient and spouse made such changes to his regimen, which now has been noted with hyperglycemia at night -I have recommended GLP-1 agonist, caution against GI side effects -Will discontinue glipizide due to increased risk of hypoglycemia with combination with insulin -I will reduce his morning NovoLog dose, and restart evening dose of insulin to prevent postprandial hyperglycemia, as below -No changes to Jardiance dose as he is complaining of dehydration and continues with dizziness with orthostatic hypotension  MEDICATIONS: Continue Jardiance 10 mg daily Change Novolin mix to 18 units before breakfast and 10 units before supper Stop glipizide 10 mg XL Start Ozempic 0.25 mg weekly, for 6 weeks, then increase to 0.5 mg weekly  EDUCATION / INSTRUCTIONS: BG monitoring instructions: Patient is instructed to check his blood sugars 3 times a day. Call [facility] if: BG persistently < 70  I reviewed the Rule of 15 for the treatment of hypoglycemia in detail with the patient. Literature supplied.    2) Diabetic complications:  Eye: Does not have known  diabetic retinopathy.  Neuro/ Feet: Does  have known diabetic peripheral neuropathy .  Renal: Patient does not have known baseline CKD. He   is not on an ACEI/ARB at present.      F/U in 4 months     Signed electronically by: Lyndle Herrlich, MD  [facility] Endocrinology  [facility] [address] [place] [place], [place] [address] Phone: [phone] FAX: [phone]   CC: [name], NP [address] [place] [address] Phone: [phone]  Fax: [phone]  Return to Endocrinology clinic as below: Future Appointments  Date Time Provider Department Center  09/27/2022  2:10 PM Jenel Lucks, MD [facility]  10/11/2022 11:00 AM Kathleene Hazel, MD [facility]  11/14/2022  1:45 PM Bjorn Pippin, MD [facility] None

## 2022-08-30 NOTE — Patient Instructions (Addendum)
Stop Glipizide  Start Ozempic 0.25 mg once weekly for 6 weeks, increase to 0.5 mg weekly  Change  Novolog Mix to 22 units before Breakfast and 10 units Before Supper Continue Jardiance 10 mg daily      HOW TO TREAT LOW BLOOD SUGARS (Blood sugar LESS THAN 70 MG/DL) Please follow the RULE OF 15 for the treatment of hypoglycemia treatment (when your (blood sugars are less than 70 mg/dL)   STEP 1: Take 15 grams of carbohydrates when your blood sugar is low, which includes:  3-4 GLUCOSE TABS  OR 3-4 OZ OF JUICE OR REGULAR SODA OR ONE TUBE OF GLUCOSE GEL    STEP 2: RECHECK blood sugar in 15 MINUTES STEP 3: If your blood sugar is still low at the 15 minute recheck --> then, go back to STEP 1 and treat AGAIN with another 15 grams of carbohydrates.

## 2022-09-02 ENCOUNTER — Other Ambulatory Visit: Payer: Self-pay

## 2022-09-02 ENCOUNTER — Encounter: Payer: Self-pay | Admitting: Hematology

## 2022-09-02 ENCOUNTER — Other Ambulatory Visit (HOSPITAL_COMMUNITY): Payer: Self-pay

## 2022-09-02 ENCOUNTER — Other Ambulatory Visit: Payer: Self-pay | Admitting: Urology

## 2022-09-02 DIAGNOSIS — E291 Testicular hypofunction: Secondary | ICD-10-CM

## 2022-09-02 MED ORDER — TESTOSTERONE CYPIONATE 200 MG/ML IM SOLN
100.0000 mg | INTRAMUSCULAR | 1 refills | Status: DC
  Filled 2022-09-02: qty 4, 28d supply, fill #0
  Filled 2022-09-11: qty 10, 140d supply, fill #0
  Filled 2022-10-14 – 2022-10-16 (×2): qty 4, 28d supply, fill #0

## 2022-09-02 NOTE — Telephone Encounter (Signed)
Please see patient message below.  Patient recently agreed to move forward with testosterone replacement therapy.  Please advise on your recommendation.  Thank you.

## 2022-09-03 ENCOUNTER — Other Ambulatory Visit: Payer: Self-pay

## 2022-09-04 ENCOUNTER — Encounter: Payer: Self-pay | Admitting: Hematology

## 2022-09-04 ENCOUNTER — Other Ambulatory Visit: Payer: Self-pay

## 2022-09-05 ENCOUNTER — Other Ambulatory Visit: Payer: Self-pay

## 2022-09-05 ENCOUNTER — Other Ambulatory Visit (HOSPITAL_COMMUNITY): Payer: Self-pay

## 2022-09-07 ENCOUNTER — Other Ambulatory Visit (HOSPITAL_COMMUNITY): Payer: Self-pay

## 2022-09-11 ENCOUNTER — Encounter: Payer: Self-pay | Admitting: Podiatry

## 2022-09-11 ENCOUNTER — Other Ambulatory Visit (HOSPITAL_COMMUNITY): Payer: Self-pay

## 2022-09-11 ENCOUNTER — Encounter: Payer: Self-pay | Admitting: Hematology

## 2022-09-11 ENCOUNTER — Other Ambulatory Visit: Payer: Self-pay

## 2022-09-12 ENCOUNTER — Other Ambulatory Visit: Payer: Self-pay

## 2022-09-14 ENCOUNTER — Other Ambulatory Visit (HOSPITAL_COMMUNITY): Payer: Self-pay

## 2022-09-16 ENCOUNTER — Other Ambulatory Visit: Payer: Self-pay

## 2022-09-16 ENCOUNTER — Encounter (HOSPITAL_COMMUNITY): Payer: Self-pay

## 2022-09-16 ENCOUNTER — Ambulatory Visit (HOSPITAL_COMMUNITY)
Admission: RE | Admit: 2022-09-16 | Discharge: 2022-09-16 | Disposition: A | Discharge status: Home / Self Care | Payer: 59 | Source: Ambulatory Visit | Attending: Emergency Medicine | Admitting: Emergency Medicine

## 2022-09-16 ENCOUNTER — Telehealth: Payer: Self-pay

## 2022-09-16 VITALS — BP 105/70 | HR 82 | Temp 98.6°F | Resp 18

## 2022-09-16 DIAGNOSIS — L089 Local infection of the skin and subcutaneous tissue, unspecified: Secondary | ICD-10-CM

## 2022-09-16 DIAGNOSIS — E11628 Type 2 diabetes mellitus with other skin complications: Secondary | ICD-10-CM | POA: Diagnosis not present

## 2022-09-16 MED ORDER — AMOXICILLIN-POT CLAVULANATE 875-125 MG PO TABS
1.0000 | ORAL_TABLET | Freq: Two times a day (BID) | ORAL | 0 refills | Status: DC
  Filled 2022-09-16: qty 20, 10d supply, fill #0

## 2022-09-16 MED ORDER — SULFAMETHOXAZOLE-TRIMETHOPRIM 800-160 MG PO TABS: 2.0000 | ORAL_TABLET | Freq: Two times a day (BID) | ORAL | 0 refills | Status: AC

## 2022-09-16 MED ORDER — AMOXICILLIN-POT CLAVULANATE 875-125 MG PO TABS: 1.0000 | ORAL_TABLET | Freq: Two times a day (BID) | ORAL | 0 refills | Status: AC

## 2022-09-16 MED ORDER — SULFAMETHOXAZOLE-TRIMETHOPRIM 800-160 MG PO TABS
2.0000 | ORAL_TABLET | Freq: Two times a day (BID) | ORAL | 0 refills | Status: DC
  Filled 2022-09-16: qty 40, 10d supply, fill #0

## 2022-09-16 NOTE — Telephone Encounter (Signed)
PA completed today Key: [id number]

## 2022-09-16 NOTE — Discharge Instructions (Signed)
To treat your foot infection I am giving you 2 prescriptions, Bactrim and Augmentin.  The Bactrim dose is 2 tablets twice daily and the Augmentin is 1 tablet twice daily.  You will take both of them together twice daily for 10 days.  I recommend that you soak your foot in warm foot baths with Epsom salt twice daily and keep the wound lightly bandaged with the gauze provided.  Do not apply tight bandaging as this may decrease circulation to the toe.  Please follow-up with your primary care provider on Wednesday as planned.  Please go to the emergency room if you notice increased swelling, pain or red streaking of your foot and/or leg.  You may require stronger, intravenous antibiotics.  Thank you for visiting [facility] today.  I hope you feel better soon.

## 2022-09-16 NOTE — ED Triage Notes (Signed)
Pt has an open wound to RT great toe after he scrape the end of his Rt toe. Injury happened over a week ago. Pt is diabetic .

## 2022-09-16 NOTE — ED Provider Notes (Signed)
[facility]    CSN: [id number] Arrival date & time: 09/16/22  1646    HISTORY   Chief Complaint  Patient presents with   Foot Injury    Entered by patient   Toe Injury   HPI Micheal Neal is a pleasant, 52 y.o. male who presents to urgent care today. Patient states that he has had an open wound on his right great toe that occurred after scraping his right great toe about a week ago.  Patient states he is diabetic and the wound is not healing well.  Patient states that he feels that the wound is looking worse and not better.  Patient is here with his wife who states that he is diabetic, previously with an A1c of 12 which is now at 6.5 due to his diligent dedication to modifying his diet and engaging in regular exercise.  Patient states he had a heart attack which was a defining moment for him.  Patient denies fever, red streaking around the wound, body aches, chills, rash.  Wife states that she has advised him to avoid soaking the wound in water for fear of making the infection worse and has avoided applying bandaging to the wounds for the same reason.  The history is provided by the patient and the spouse.   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   GIB (gastrointestinal bleeding) 07/14/2022   Gastrointestinal hemorrhage 07/13/2022   History of myocardial infarction within last 3 months 07/13/2022   S/P insertion of non-drug eluting coronary artery stent 07/13/2022   Hematochezia 07/12/2022   ABLA (acute blood loss anemia) 07/12/2022   B12 deficiency 07/11/2022   Normocytic anemia due  to blood loss 07/11/2022   Encounter for hepatitis C screening test for low risk patient 07/11/2022   Nipple pain 07/11/2022   Vitamin D deficiency 06/06/2022   Coronary artery disease involving native coronary artery of native heart 06/06/2022   Erectile dysfunction 06/06/2022   Acute pain in right eye 05/30/2022   Redness of right eye 05/30/2022   Witnessed episode of apnea 05/30/2022   Charcot's joint of foot, left 05/30/2022   Acute conjunctivitis of right eye 05/30/2022   Diaphoresis 05/27/2022   AKI (acute kidney injury) (HCC) 05/21/2022   Morbid obesity (HCC) 05/20/2022   Hyperglycemia 05/19/2022   Syncope and collapse 05/19/2022   Non-STEMI (non-ST elevated myocardial infarction) (HCC) 05/19/2022   Heart palpitations 01/21/2022   Orthostatic hypotension 01/21/2022   Dizzy spells 01/21/2022   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) 01/21/2022   Hypertriglyceridemia 11/07/2021   Diabetes 1.5, 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   Hyperlipidemia with target LDL less than 100 11/06/2021   Hypotension 11/06/2021   Abnormal electrocardiogram (ECG) (EKG) 11/06/2021   Dizziness 08/13/2021   Obesity (BMI 30-39.9) 05/24/2017   Insomnia 10/15/2014   Diabetic neuropathy (HCC) 10/14/2014   Allergic rhinitis 07/01/2014   Uncontrolled type 2 diabetes mellitus with hyperglycemia, with long-term current use of insulin (HCC) 02/20/2011   Past Surgical History:  Procedure Laterality Date  BIOPSY  07/13/2022   Procedure: BIOPSY;  Surgeon: Jenel Lucks, MD;  Location: [facility] ENDOSCOPY;  Service: Gastroenterology;;   BIOPSY  07/15/2022   Procedure: BIOPSY;  Surgeon: Beverley Fiedler, MD;  Location: [facility] ENDOSCOPY;  Service: Gastroenterology;;   COLONOSCOPY N/A 07/15/2022   Procedure: COLONOSCOPY;  Surgeon: Beverley Fiedler, MD;  Location: [facility] ENDOSCOPY;  Service: Gastroenterology;  Laterality: N/A;   CORONARY STENT INTERVENTION N/A 05/20/2022   Procedure: CORONARY STENT  INTERVENTION;  Surgeon: Runell Gess, MD;  Location: [facility];  Service: Cardiovascular;  Laterality: N/A;   ESOPHAGOGASTRODUODENOSCOPY N/A 07/13/2022   Procedure: ESOPHAGOGASTRODUODENOSCOPY (EGD);  Surgeon: Jenel Lucks, MD;  Location[facility]COPY;  Service: Gastroenterology;  Laterality: N/A;   LEFT HEART CATH AND CORONARY ANGIOGRAPHY N/A 05/20/2022   Procedure: LEFT HEART CATH AND CORONARY ANGIOGRAPHY;  Surgeon: Runell Gess, MD;  Location: [facility];  Service: Cardiovascular;  Laterality: N/A;   NO PAST SURGERIES      Home Medications    Prior to Admission medications   Medication Sig Start Date End Date Taking? Authorizing Provider  aspirin EC 81 MG tablet Take 1 tablet (81 mg total) by mouth daily. Swallow whole. Patient taking differently: Take 81 mg by mouth daily. 02/05/22  Yes Etta Grandchild, MD  atorvastatin (LIPITOR) 80 MG tablet Take 1 tablet (80 mg total) by mouth daily. 05/22/22 05/17/23 Yes Almon Hercules, MD  carvedilol (COREG) 3.125 MG tablet Take 1 tablet (3.125 mg total) by mouth 2 (two) times daily with a meal. 07/11/22  Yes [name], NP  clopidogrel (PLAVIX) 75 MG tablet Take 4 tablets at same time (300 mg) on day 1, then take 1 tablet by mouth daily thereafter 07/24/22  Yes Runell Gess, MD  Continuous Glucose Sensor (DEXCOM G7 SENSOR) MISC Use as directed. Change every 10 days. 05/31/22  Yes Shamleffer, Konrad Dolores, MD  CYANOCOBALAMIN IJ Inject 1 Dose as directed every 30 (thirty) days.   Yes [provider]  empagliflozin (JARDIANCE) 10 MG TABS tablet Take 1 tablet (10 mg total) by mouth daily. 08/30/22  Yes Shamleffer, Konrad Dolores, MD  insulin isophane & regular human KwikPen (NOVOLIN 70/30 KWIKPEN) (70-30) 100 UNIT/ML KwikPen Inject 10 units twice a day 08/12/22  Yes [name], NP  Insulin NPH Isophane & Regular (NOVOLIN 70/30 FLEXPEN [place]) Inject 10 Units into the skin 2 (two) times daily.   Yes [provider]  Insulin Pen Needle (TRUEPLUS PEN NEEDLES) 31G X 5 MM MISC USE AS DIRECTED FOUR TIMES DAILY 05/31/22  Yes Shamleffer, Konrad Dolores, MD  midodrine (PROAMATINE) 10 MG tablet Take 1 tablet (10 mg total) by mouth 3 (three) times daily. 07/01/22  Yes Kathleene Hazel, MD  omega-3 acid ethyl esters (LOVAZA) 1 g capsule Take 2 capsules (2 g total) by mouth 2 (two) times daily. 11/07/21  Yes Etta Grandchild, MD  sildenafil (VIAGRA) 100 MG tablet Take either 1 tab po prn or 1/2 tab po prn with 1/2 tab of tadalafil. 08/15/22  Yes Bjorn Pippin, MD  tadalafil (CIALIS) 20 MG tablet 1 tab po prn or 1/2 tab po with 1/2 tab of sildenafil 100mg . 08/15/22  Yes Bjorn Pippin, MD  testosterone cypionate (DEPOTESTOSTERONE CYPIONATE) 200 MG/ML injection Inject 0.5 mLs (100 mg total) into the muscle once a week. 09/02/22   Bjorn Pippin, MD  Vitamin D, Ergocalciferol, (DRISDOL) 1.25 MG (50000 UNIT) CAPS capsule Take 1 capsule (50,000 Units total) by  mouth every 7 (seven) days. 08/12/22  Yes [name], NP  amoxicillin-clavulanate (AUGMENTIN) 875-125 MG tablet Take 1 tablet by mouth 2 (two) times daily for 10 days. 09/16/22 09/26/22  Theadora Rama Scales, PA-C  Semaglutide,0.25 or 0.5MG /DOS, 2 MG/3ML SOPN Inject 0.5 mg into the skin once a week. 08/30/22   Shamleffer, Konrad Dolores, MD  sulfamethoxazole-trimethoprim (BACTRIM DS) 800-160 MG tablet Take 2 tablets by mouth 2 (two) times daily for 10 days. 09/16/22 09/26/22  Theadora Rama Scales, PA-C    Family History Family History  Problem Relation Age of Onset   Heart disease Mother    Breast cancer Maternal Grandmother        diagnosed late 56's   Breast cancer Maternal Aunt    Social History Social History   Tobacco Use   Smoking status: Never   Smokeless tobacco: Never  Vaping Use   Vaping Use: Never used  Substance Use Topics   Alcohol use: No   Drug use: No   Allergies   Patient has no known allergies.  Review of Systems Review of  Systems Pertinent findings revealed after performing a 14 point review of systems has been noted in the history of present illness.  Physical Exam Vital Signs BP 105/70   Pulse 82   Temp 98.6 F (37 C)   Resp 18   SpO2 98%   No data found.  Physical Exam Vitals and nursing note reviewed.  Constitutional:      General: He is not in acute distress.    Appearance: Normal appearance. He is normal weight. He is not ill-appearing.  HENT:     Head: Normocephalic and atraumatic.  Eyes:     Extraocular Movements: Extraocular movements intact.     Conjunctiva/sclera: Conjunctivae normal.     Pupils: Pupils are equal, round, and reactive to light.  Cardiovascular:     Rate and Rhythm: Normal rate and regular rhythm.  Pulmonary:     Effort: Pulmonary effort is normal.     Breath sounds: Normal breath sounds.  Musculoskeletal:        General: Normal range of motion.     Cervical back: Normal range of motion and neck supple.       Feet:  Skin:    General: Skin is warm and dry.     Findings: Wound (Tip of right great toe) present.  Neurological:     General: No focal deficit present.     Mental Status: He is alert and oriented to person, place, and time. Mental status is at baseline.  Psychiatric:        Mood and Affect: Mood normal.        Behavior: Behavior normal.        Thought Content: Thought content normal.        Judgment: Judgment normal.     Visual Acuity Right Eye Distance:   Left Eye Distance:   Bilateral Distance:    Right Eye Near:   Left Eye Near:    Bilateral Near:     UC Couse / Diagnostics / Procedures:     Radiology No results found.  Procedures Procedures (including critical care time) EKG  Pending results:  Labs Reviewed - No data to display  Medications Ordered in UC: Medications - No data to display  UC Diagnoses / Final Clinical Impressions(s)   I have reviewed the triage vital signs and the nursing notes.  Pertinent labs & imaging  results that were available during my  care of the patient were reviewed by me and considered in my medical decision making (see chart for details).    Final diagnoses:  Type 2 diabetes mellitus with right diabetic foot infection [facility])   Patient states he already has an appointment with his PCP in 2 days.  Patient provided with prescriptions for Augmentin and Bactrim for coverage of streptococci, MRSA, Pseudomonas aeruginosa and other anaerobic organisms causing infection in right great toe.  Patient advised to keep wound bandaged with Xeroform and loose gauze until seen by PCP.  Emergency precautions advised.  Please see discharge instructions below for details of plan of care as provided to patient. ED Prescriptions     Medication Sig Dispense Auth. Provider   amoxicillin-clavulanate (AUGMENTIN) 875-125 MG tablet Take 1 tablet by mouth 2 (two) times daily for 10 days. 20 tablet Theadora Rama Scales, PA-C   sulfamethoxazole-trimethoprim (BACTRIM DS) 800-160 MG tablet Take 2 tablets by mouth 2 (two) times daily for 10 days. 40 tablet Theadora Rama Scales, PA-C      PDMP not reviewed this encounter.  Pending results:  Labs Reviewed - No data to display  Discharge Instructions:   Discharge Instructions      To treat your foot infection I am giving you 2 prescriptions, Bactrim and Augmentin.  The Bactrim dose is 2 tablets twice daily and the Augmentin is 1 tablet twice daily.  You will take both of them together twice daily for 10 days.  I recommend that you soak your foot in warm foot baths with Epsom salt twice daily and keep the wound lightly bandaged with the gauze provided.  Do not apply tight bandaging as this may decrease circulation to the toe.  Please follow-up with your primary care provider on Wednesday as planned.  Please go to the emergency room if you notice increased swelling, pain or red streaking of your foot and/or leg.  You may require stronger, intravenous  antibiotics.  Thank you for visiting [facility] today.  I hope you feel better soon.    Disposition Upon Discharge:  Condition: stable for discharge home  Patient presented with an acute illness with associated systemic symptoms and significant discomfort requiring urgent management. In my opinion, this is a condition that a prudent lay person (someone who possesses an average knowledge of health and medicine) may potentially expect to result in complications if not addressed urgently such as respiratory distress, impairment of bodily function or dysfunction of bodily organs.   Routine symptom specific, illness specific and/or disease specific instructions were discussed with the patient and/or caregiver at length.   As such, the patient has been evaluated and assessed, work-up was performed and treatment was provided in alignment with urgent care protocols and evidence based medicine.  Patient/parent/caregiver has been advised that the patient may require follow up for further testing and treatment if the symptoms continue in spite of treatment, as clinically indicated and appropriate.  Patient/parent/caregiver has been advised to return to the [facility] or PCP if no better; to PCP or the Emergency Department if new signs and symptoms develop, or if the current signs or symptoms continue to change or worsen for further workup, evaluation and treatment as clinically indicated and appropriate  The patient will follow up with their current PCP if and as advised. If the patient does not currently have a PCP we will assist them in obtaining one.   The patient may need specialty follow up if the symptoms continue, in spite of conservative  treatment and management, for further workup, evaluation, consultation and treatment as clinically indicated and appropriate.  Patient/parent/caregiver verbalized understanding and agreement of plan as discussed.  All questions were addressed during visit.   Please see discharge instructions below for further details of plan.  This office note has been dictated using Teaching laboratory technician.  Unfortunately, this method of dictation can sometimes lead to typographical or grammatical errors.  I apologize for your inconvenience in advance if this occurs.  Please do not hesitate to reach out to me if clarification is needed.      Theadora Rama Scales, [place] 09/17/22 [phone]

## 2022-09-17 ENCOUNTER — Other Ambulatory Visit (HOSPITAL_COMMUNITY): Payer: Self-pay

## 2022-09-17 ENCOUNTER — Other Ambulatory Visit: Payer: Self-pay

## 2022-09-18 ENCOUNTER — Ambulatory Visit (INDEPENDENT_AMBULATORY_CARE_PROVIDER_SITE_OTHER): Payer: 59 | Admitting: Podiatry

## 2022-09-18 DIAGNOSIS — S90424A Blister (nonthermal), right lesser toe(s), initial encounter: Secondary | ICD-10-CM

## 2022-09-18 NOTE — Progress Notes (Signed)
Subjective:  Patient ID: Micheal Neal, male    DOB: May 10, 1970,  MRN: [id number]  Chief Complaint  Patient presents with   Foot Ulcer     Right great toe ulcer     52 y.o. male presents with the above complaint.  Patient presents with new complaint of right great toe blister formation without deep underlying ulceration.  Patient states that he stubbed it and he went to get it evaluated.  He is a diabetic.  He went to the urgent care who placed him on antibiotics and told to follow-up.Marland Kitchen  He would like to discuss other treatment options if there are any.  He does not want to lose the toe.  Denies any nausea fever chills morbidly   Review of Systems: Negative except as noted in the HPI. Denies N/V/F/Ch.  Past Medical History:  Diagnosis Date   Abnormal EKG    Anemia 07/11/2022   Contusion of left foot    Diabetes mellitus without complication (HCC)    Dizziness    Encounter for hepatitis C screening test for low risk patient 07/11/2022   GERD (gastroesophageal reflux disease)    Near syncope    Palpitations    Vertigo     Current Outpatient Medications:    testosterone cypionate (DEPOTESTOSTERONE CYPIONATE) 200 MG/ML injection, Inject 0.5 mLs (100 mg total) into the muscle once a week., Disp: 10 mL, Rfl: 1   amoxicillin-clavulanate (AUGMENTIN) 875-125 MG tablet, Take 1 tablet by mouth 2 (two) times daily for 10 days., Disp: 20 tablet, Rfl: 0   aspirin EC 81 MG tablet, Take 1 tablet (81 mg total) by mouth daily. Swallow whole. (Patient taking differently: Take 81 mg by mouth daily.), Disp: 90 tablet, Rfl: 1   atorvastatin (LIPITOR) 80 MG tablet, Take 1 tablet (80 mg total) by mouth daily., Disp: 90 tablet, Rfl: 3   carvedilol (COREG) 3.125 MG tablet, Take 1 tablet (3.125 mg total) by mouth 2 (two) times daily with a meal., Disp: 60 tablet, Rfl: 3   clopidogrel (PLAVIX) 75 MG tablet, Take 4 tablets at same time (300 mg) on day 1, then take 1 tablet by mouth daily thereafter, Disp: 94  tablet, Rfl: 3   Continuous Glucose Sensor (DEXCOM G7 SENSOR) MISC, Use as directed. Change every 10 days., Disp: 9 each, Rfl: 3   CYANOCOBALAMIN IJ, Inject 1 Dose as directed every 30 (thirty) days., Disp: , Rfl:    empagliflozin (JARDIANCE) 10 MG TABS tablet, Take 1 tablet (10 mg total) by mouth daily., Disp: 30 tablet, Rfl: 11   famotidine (PEPCID) 20 MG tablet, Take 1 tablet (20 mg total) by mouth 2 (two) times daily., Disp: 14 tablet, Rfl: 0   insulin isophane & regular human KwikPen (NOVOLIN 70/30 KWIKPEN) (70-30) 100 UNIT/ML KwikPen, Inject 10 units twice a day, Disp: 45 mL, Rfl: 0   Insulin NPH Isophane & Regular (NOVOLIN 70/30 FLEXPEN [place]), Inject 10 Units into the skin 2 (two) times daily., Disp: , Rfl:    Insulin Pen Needle (TRUEPLUS PEN NEEDLES) 31G X 5 MM MISC, USE AS DIRECTED FOUR TIMES DAILY, Disp: 200 each, Rfl: 3   midodrine (PROAMATINE) 10 MG tablet, Take 1 tablet (10 mg total) by mouth 3 (three) times daily., Disp: 90 tablet, Rfl: 1   omega-3 acid ethyl esters (LOVAZA) 1 g capsule, Take 2 capsules (2 g total) by mouth 2 (two) times daily., Disp: 360 capsule, Rfl: 1   ondansetron (ZOFRAN-ODT) 4 MG disintegrating tablet, Take 1 tablet (4 mg  total) by mouth every 8 (eight) hours as needed for nausea or vomiting., Disp: 20 tablet, Rfl: 0   Semaglutide,0.25 or 0.5MG /DOS, 2 MG/3ML SOPN, Inject 0.5 mg into the skin once a week., Disp: 3 mL, Rfl: 11   sildenafil (VIAGRA) 100 MG tablet, Take either 1 tab po prn or 1/2 tab po prn with 1/2 tab of tadalafil., Disp: 10 tablet, Rfl: 11   sulfamethoxazole-trimethoprim (BACTRIM DS) 800-160 MG tablet, Take 2 tablets by mouth 2 (two) times daily for 10 days., Disp: 40 tablet, Rfl: 0   tadalafil (CIALIS) 20 MG tablet, 1 tab po prn or 1/2 tab po with 1/2 tab of sildenafil 100mg ., Disp: 10 tablet, Rfl: 11   Vitamin D, Ergocalciferol, (DRISDOL) 1.25 MG (50000 UNIT) CAPS capsule, Take 1 capsule (50,000 Units total) by mouth every 7 (seven) days., Disp: 4  capsule, Rfl: 2  Social History   Tobacco Use  Smoking Status Never  Smokeless Tobacco Never    No Known Allergies Objective:  There were no vitals filed for this visit. There is no height or weight on file to calculate BMI. Constitutional Well developed. Well nourished.  Vascular Dorsalis pedis pulses palpable bilaterally. Posterior tibial pulses palpable bilaterally. Capillary refill normal to all digits.  No cyanosis or clubbing noted. Pedal hair growth normal.  Neurologic Normal speech. Oriented to person, place, and time. Epicritic sensation to light touch grossly present bilaterally.  Dermatologic Right hallux blister formation noted without any open wounds.  Mild redness no signs of purulent drainage or malodor present.  No deep open wound noted.  Orthopedic: Normal joint ROM without pain or crepitus bilaterally. No visible deformities. No bony tenderness.   Radiographs: None Assessment:   1. Toe blister without infection, right, initial encounter    Plan:  Patient was evaluated and treated and all questions answered.  Right hallux blister - All questions and concerns were discussed with the patient in extensive detail.  Given that there is mild redness present I believe she will benefit from continuing to finish the antibiotics that he was given through the emergency room.  He states understanding will do so immediately -Betadine wet-to-dry dressing changes daily - No follow-ups on file.  Right hallux blister after stubbing the toe surgical shoe he is already on antibiotics from urgent care Betadine wet-to-dry dressing

## 2022-09-23 ENCOUNTER — Other Ambulatory Visit (HOSPITAL_COMMUNITY): Payer: Self-pay

## 2022-09-24 ENCOUNTER — Other Ambulatory Visit: Payer: Self-pay

## 2022-09-25 ENCOUNTER — Ambulatory Visit
Admission: EM | Admit: 2022-09-25 | Discharge: 2022-09-25 | Disposition: A | Discharge status: Home / Self Care | Payer: 59 | Attending: Nurse Practitioner | Admitting: Nurse Practitioner

## 2022-09-25 DIAGNOSIS — K219 Gastro-esophageal reflux disease without esophagitis: Secondary | ICD-10-CM | POA: Diagnosis not present

## 2022-09-25 DIAGNOSIS — R1013 Epigastric pain: Secondary | ICD-10-CM

## 2022-09-25 MED ORDER — LIDOCAINE VISCOUS HCL 2 % MT SOLN
15.0000 mL | Freq: Once | OROMUCOSAL | Status: AC
  Administered 2022-09-25: 15 mL via OROMUCOSAL

## 2022-09-25 MED ORDER — FAMOTIDINE 20 MG PO TABS: 20.0000 mg | ORAL_TABLET | Freq: Two times a day (BID) | ORAL | 0 refills | Status: DC

## 2022-09-25 MED ORDER — ALUM & MAG HYDROXIDE-SIMETH 200-200-20 MG/5ML PO SUSP
30.0000 mL | Freq: Once | ORAL | Status: AC
  Administered 2022-09-25: 30 mL via ORAL

## 2022-09-25 MED ORDER — ONDANSETRON 4 MG PO TBDP: 4.0000 mg | ORAL_TABLET | Freq: Three times a day (TID) | ORAL | 0 refills | Status: DC | PRN

## 2022-09-25 MED ORDER — ONDANSETRON 4 MG PO TBDP
4.0000 mg | ORAL_TABLET | Freq: Once | ORAL | Status: AC
  Administered 2022-09-25: 4 mg via ORAL

## 2022-09-25 NOTE — ED Provider Notes (Signed)
[facility]    CSN: [id number] Arrival date & time: 09/25/22  7829      History   Chief Complaint No chief complaint on file.   HPI Micheal Neal is a 52 y.o. male.   Patient presents today with 2-day history of epigastric abdominal pain.  Reports the pain comes and goes, onset is gradual after eating.  Current severity is a 5 out of 10 that he describes as burning/bloating.  The pain lasts a few hours.  Patient denies radiation of pain to other part of his abdomen, down his legs, or around to his back.  He denies fever, decreased appetite, diarrhea or constipation, blood in the vomit or stool, new rash, burning with urination, increased urinary frequency, and hematuria.  No difficulty swallowing, regurgitation of food, or painful swallowing.  He has been nauseous and has thrown up today 2 times that was undigested food.  Also endorses heartburn and bloating.  Has not taken anything for symptoms so far.  Reports he has a history of type 2 diabetes and has been changing his diet, eating a lot of salads, apples, other healthy foods and does not know what may have caused symptoms.  Reports he recently was started on 2 different antibiotics for a foot infection more than 1 week ago.  Reports history of acid reflux, not currently treated.      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   GIB (gastrointestinal bleeding) 07/14/2022   Gastrointestinal hemorrhage 07/13/2022   History of myocardial infarction within last 3 months 07/13/2022   S/P insertion of non-drug eluting coronary artery stent  07/13/2022   Hematochezia 07/12/2022   ABLA (acute blood loss anemia) 07/12/2022   B12 deficiency 07/11/2022   Normocytic anemia due to blood loss 07/11/2022   Encounter for hepatitis C screening test for low risk patient 07/11/2022   Nipple pain 07/11/2022   Vitamin D deficiency 06/06/2022   Coronary artery disease involving native coronary artery of native heart 06/06/2022   Erectile dysfunction 06/06/2022   Acute pain in right eye 05/30/2022   Redness of right eye 05/30/2022   Witnessed episode of apnea 05/30/2022   Charcot's joint of foot, left 05/30/2022   Acute conjunctivitis of right eye 05/30/2022   Diaphoresis 05/27/2022   AKI (acute kidney injury) (HCC) 05/21/2022   Morbid obesity (HCC) 05/20/2022   Hyperglycemia 05/19/2022   Syncope and collapse 05/19/2022   Non-STEMI (non-ST elevated myocardial infarction) (HCC) 05/19/2022   Heart palpitations 01/21/2022   Orthostatic hypotension 01/21/2022   Dizzy spells 01/21/2022   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) 01/21/2022   Hypertriglyceridemia 11/07/2021   Diabetes 1.5, 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   Hyperlipidemia with target LDL less than 100 11/06/2021   Hypotension 11/06/2021   Abnormal electrocardiogram (ECG) (EKG) 11/06/2021   Dizziness 08/13/2021   Obesity (BMI 30-39.9) 05/24/2017   Insomnia 10/15/2014   Diabetic neuropathy (HCC) 10/14/2014   Allergic rhinitis 07/01/2014   Uncontrolled type 2 diabetes mellitus with hyperglycemia, with long-term current  use of insulin (HCC) 02/20/2011    Past Surgical History:  Procedure Laterality Date   BIOPSY  07/13/2022   Procedure: BIOPSY;  Surgeon: Jenel Lucks, MD;  Location: [facility] ENDOSCOPY;  Service: Gastroenterology;;   BIOPSY  07/15/2022   Procedure: BIOPSY;  Surgeon: Beverley Fiedler, MD;  Location: [facility] ENDOSCOPY;  Service: Gastroenterology;;   COLONOSCOPY N/A 07/15/2022   Procedure: COLONOSCOPY;  Surgeon: Beverley Fiedler, MD;  Location: [facility]  ENDOSCOPY;  Service: Gastroenterology;  Laterality: N/A;   CORONARY STENT INTERVENTION N/A 05/20/2022   Procedure: CORONARY STENT INTERVENTION;  Surgeon: Runell Gess, MD;  Location: [facility] INVASIVE CV LAB;  Service: Cardiovascular;  Laterality: N/A;   ESOPHAGOGASTRODUODENOSCOPY N/A 07/13/2022   Procedure: ESOPHAGOGASTRODUODENOSCOPY (EGD);  Surgeon: Jenel Lucks, MD;  Location: [facility] ENDOSCOPY;  Service: Gastroenterology;  Laterality: N/A;   LEFT HEART CATH AND CORONARY ANGIOGRAPHY N/A 05/20/2022   Procedure: LEFT HEART CATH AND CORONARY ANGIOGRAPHY;  Surgeon: Runell Gess, MD;  Location: [facility] INVASIVE CV LAB;  Service: Cardiovascular;  Laterality: N/A;   NO PAST SURGERIES         Home Medications    Prior to Admission medications   Medication Sig Start Date End Date Taking? Authorizing Provider  famotidine (PEPCID) 20 MG tablet Take 1 tablet (20 mg total) by mouth 2 (two) times daily. 09/25/22  Yes Valentino Nose, NP  ondansetron (ZOFRAN-ODT) 4 MG disintegrating tablet Take 1 tablet (4 mg total) by mouth every 8 (eight) hours as needed for nausea or vomiting. 09/25/22  Yes Valentino Nose, NP  testosterone cypionate (DEPOTESTOSTERONE CYPIONATE) 200 MG/ML injection Inject 0.5 mLs (100 mg total) into the muscle once a week. 09/02/22   Bjorn Pippin, MD  amoxicillin-clavulanate (AUGMENTIN) 875-125 MG tablet Take 1 tablet by mouth 2 (two) times daily for 10 days. 09/16/22 09/26/22  Theadora Rama Scales, PA-C  aspirin EC 81 MG tablet Take 1 tablet (81 mg total) by mouth daily. Swallow whole. Patient taking differently: Take 81 mg by mouth daily. 02/05/22   Etta Grandchild, MD  atorvastatin (LIPITOR) 80 MG tablet Take 1 tablet (80 mg total) by mouth daily. 05/22/22 05/17/23  Almon Hercules, MD  carvedilol (COREG) 3.125 MG tablet Take 1 tablet (3.125 mg total) by mouth 2 (two) times daily with a meal. 07/11/22   [name], NP  clopidogrel (PLAVIX) 75 MG tablet Take 4 tablets at same time  (300 mg) on day 1, then take 1 tablet by mouth daily thereafter 07/24/22   Runell Gess, MD  Continuous Glucose Sensor (DEXCOM G7 SENSOR) MISC Use as directed. Change every 10 days. 05/31/22   Shamleffer, Konrad Dolores, MD  CYANOCOBALAMIN IJ Inject 1 Dose as directed every 30 (thirty) days.    [provider]  empagliflozin (JARDIANCE) 10 MG TABS tablet Take 1 tablet (10 mg total) by mouth daily. 08/30/22   Shamleffer, Konrad Dolores, MD  insulin isophane & regular human KwikPen (NOVOLIN 70/30 KWIKPEN) (70-30) 100 UNIT/ML KwikPen Inject 10 units twice a day 08/12/22   [name], NP  Insulin NPH Isophane & Regular (NOVOLIN 70/30 FLEXPEN [place]) Inject 10 Units into the skin 2 (two) times daily.    [provider]  Insulin Pen Needle (TRUEPLUS PEN NEEDLES) 31G X 5 MM MISC USE AS DIRECTED FOUR TIMES DAILY 05/31/22   Shamleffer, Konrad Dolores, MD  midodrine (PROAMATINE) 10 MG tablet Take 1 tablet (10 mg total) by mouth 3 (three) times daily. 07/01/22   Verne Carrow  D, MD  omega-3 acid ethyl esters (LOVAZA) 1 g capsule Take 2 capsules (2 g total) by mouth 2 (two) times daily. 11/07/21   Etta Grandchild, MD  Semaglutide,0.25 or 0.5MG /DOS, 2 MG/3ML SOPN Inject 0.5 mg into the skin once a week. 08/30/22   Shamleffer, Konrad Dolores, MD  sildenafil (VIAGRA) 100 MG tablet Take either 1 tab po prn or 1/2 tab po prn with 1/2 tab of tadalafil. 08/15/22   Bjorn Pippin, MD  sulfamethoxazole-trimethoprim (BACTRIM DS) 800-160 MG tablet Take 2 tablets by mouth 2 (two) times daily for 10 days. 09/16/22 09/26/22  Theadora Rama Scales, PA-C  tadalafil (CIALIS) 20 MG tablet 1 tab po prn or 1/2 tab po with 1/2 tab of sildenafil 100mg . 08/15/22   Bjorn Pippin, MD  Vitamin D, Ergocalciferol, (DRISDOL) 1.25 MG (50000 UNIT) CAPS capsule Take 1 capsule (50,000 Units total) by mouth every 7 (seven) days. 08/12/22   [name], NP    Family History Family History  Problem Relation Age of Onset   Heart  disease Mother    Breast cancer Maternal Grandmother        diagnosed late 67's   Breast cancer Maternal Aunt     Social History Social History   Tobacco Use   Smoking status: Never   Smokeless tobacco: Never  Vaping Use   Vaping Use: Never used  Substance Use Topics   Alcohol use: No   Drug use: No     Allergies   Patient has no known allergies.   Review of Systems Review of Systems Per HPI  Physical Exam Triage Vital Signs ED Triage Vitals  Enc Vitals Group     BP      Pulse      Resp      Temp      Temp src      SpO2      Weight      Height      Head Circumference      Peak Flow      Pain Score      Pain Loc      Pain Edu?      Excl. in GC?    No data found.  Updated Vital Signs BP 117/89 (BP Location: Left Arm)   Pulse 97   Temp 98.1 F (36.7 C) (Oral)   Resp 16   SpO2 98%   Visual Acuity Right Eye Distance:   Left Eye Distance:   Bilateral Distance:    Right Eye Near:   Left Eye Near:    Bilateral Near:     Physical Exam Vitals and nursing note reviewed.  Constitutional:      General: He is not in acute distress.    Appearance: Normal appearance. He is not toxic-appearing.  HENT:     Head: Normocephalic and atraumatic.     Mouth/Throat:     Mouth: Mucous membranes are moist.     Pharynx: Oropharynx is clear. No posterior oropharyngeal erythema.  Cardiovascular:     Rate and Rhythm: Normal rate and regular rhythm.  Pulmonary:     Effort: Pulmonary effort is normal. No respiratory distress.     Breath sounds: Normal breath sounds. No wheezing, rhonchi or rales.  Abdominal:     General: Abdomen is flat. Bowel sounds are normal. There is no distension.     Palpations: Abdomen is soft.     Tenderness: There is no abdominal tenderness. There is no right CVA tenderness, left  CVA tenderness, guarding or rebound.       Comments: Patient points to area as area of pain; no tenderness to palpation  Musculoskeletal:     Cervical back:  Normal range of motion.  Lymphadenopathy:     Cervical: No cervical adenopathy.  Skin:    General: Skin is warm and dry.     Capillary Refill: Capillary refill takes less than 2 seconds.     Coloration: Skin is not jaundiced or pale.     Findings: No erythema.  Neurological:     Mental Status: He is alert and oriented to person, place, and time.     Motor: No weakness.     Gait: Gait normal.  Psychiatric:        Behavior: Behavior is cooperative.      UC Treatments / Results  Labs (all labs ordered are listed, but only abnormal results are displayed) Labs Reviewed - No data to display  EKG   Radiology No results found.  Procedures Procedures (including critical care time)  Medications Ordered in UC Medications  alum & mag hydroxide-simeth (MAALOX/MYLANTA) 200-200-20 MG/5ML suspension 30 mL (30 mLs Oral Given 09/25/22 0940)  lidocaine (XYLOCAINE) 2 % viscous mouth solution 15 mL (15 mLs Mouth/Throat Given 09/25/22 0940)  ondansetron (ZOFRAN-ODT) disintegrating tablet 4 mg (4 mg Oral Given 09/25/22 0941)    Initial Impression / Assessment and Plan / UC Course  I have reviewed the triage vital signs and the nursing notes.  Pertinent labs & imaging results that were available during my care of the patient were reviewed by me and considered in my medical decision making (see chart for details).   Patient is well-appearing, normotensive, afebrile, not tachycardic, not tachypneic, oxygenating well on room air.    1. Abdominal pain, epigastric 2. Gastroesophageal reflux disease without esophagitis Suspect ongoing acid reflux Vital signs and exam today are reassuring GI cocktail given with complete improvement in pain-patient stated pain 0 out of 10 after GI cocktail Nausea also improved with Zofran Start Pepcid twice daily for 7 days, continue Zofran as needed Strict ER and return precautions discussed with patient Recommended follow-up with PCP if symptoms persist/worsen  despite treatment  The patient was given the opportunity to ask questions.  All questions answered to their satisfaction.  The patient is in agreement to this plan.    Final Clinical Impressions(s) / UC Diagnoses   Final diagnoses:  Abdominal pain, epigastric  Gastroesophageal reflux disease without esophagitis     Discharge Instructions      We have given you Maalox/Mylanta today and Zofran to help with nausea/vomiting.  We can continue these medicines at home as needed-the MiraLAX/Mylanta is over-the-counter and Zofran is every 8 hours as needed for nausea/vomiting.  Start Pepcid twice daily for about a week or so to help with stomach acid.  In the meantime, avoid highly acidic foods; see the handout attached.  Follow-up with PCP for persistent/worsening symptoms despite treatment.     ED Prescriptions     Medication Sig Dispense Auth. Provider   famotidine (PEPCID) 20 MG tablet Take 1 tablet (20 mg total) by mouth 2 (two) times daily. 14 tablet Cathlean Marseilles A, NP   ondansetron (ZOFRAN-ODT) 4 MG disintegrating tablet Take 1 tablet (4 mg total) by mouth every 8 (eight) hours as needed for nausea or vomiting. 20 tablet Valentino Nose, NP      PDMP not reviewed this encounter.   Valentino Nose, NP 09/25/22 1011

## 2022-09-25 NOTE — Discharge Instructions (Signed)
We have given you Maalox/Mylanta today and Zofran to help with nausea/vomiting.  We can continue these medicines at home as needed-the MiraLAX/Mylanta is over-the-counter and Zofran is every 8 hours as needed for nausea/vomiting.  Start Pepcid twice daily for about a week or so to help with stomach acid.  In the meantime, avoid highly acidic foods; see the handout attached.  Follow-up with PCP for persistent/worsening symptoms despite treatment.

## 2022-09-25 NOTE — ED Triage Notes (Signed)
Pt c/o adb pain and emesis, pt states he feels like its trapped in his upper stomach, making him burp and throw up hard to breath and sleep on his side, x 2 days.

## 2022-09-27 ENCOUNTER — Ambulatory Visit: Payer: 59 | Admitting: Gastroenterology

## 2022-09-30 ENCOUNTER — Other Ambulatory Visit (HOSPITAL_COMMUNITY): Payer: Self-pay

## 2022-09-30 ENCOUNTER — Telehealth: Payer: 59 | Admitting: Family Medicine

## 2022-09-30 DIAGNOSIS — K5900 Constipation, unspecified: Secondary | ICD-10-CM

## 2022-09-30 NOTE — Patient Instructions (Addendum)
Micheal Neal, thank you for joining Micheal Finner, NP for today's virtual visit.  While this provider is not your primary care provider (PCP), if your PCP is located in our provider database this encounter information will be shared with them immediately following your visit.   A [facility] MyChart account gives you access to today's visit and all your visits, tests, and labs performed at [facility] " click here if you don't have a [name] MyChart account or go to [email or web address]  Consent: (Patient) Micheal Neal provided verbal consent for this virtual visit at the beginning of the encounter.  Current Medications:  Current Outpatient Medications:    testosterone cypionate (DEPOTESTOSTERONE CYPIONATE) 200 MG/ML injection, Inject 0.5 mLs (100 mg total) into the muscle once a week., Disp: 10 mL, Rfl: 1   aspirin EC 81 MG tablet, Take 1 tablet (81 mg total) by mouth daily. Swallow whole. (Patient taking differently: Take 81 mg by mouth daily.), Disp: 90 tablet, Rfl: 1   atorvastatin (LIPITOR) 80 MG tablet, Take 1 tablet (80 mg total) by mouth daily., Disp: 90 tablet, Rfl: 3   carvedilol (COREG) 3.125 MG tablet, Take 1 tablet (3.125 mg total) by mouth 2 (two) times daily with a meal., Disp: 60 tablet, Rfl: 3   clopidogrel (PLAVIX) 75 MG tablet, Take 4 tablets at same time (300 mg) on day 1, then take 1 tablet by mouth daily thereafter, Disp: 94 tablet, Rfl: 3   Continuous Glucose Sensor (DEXCOM G7 SENSOR) MISC, Use as directed. Change every 10 days., Disp: 9 each, Rfl: 3   CYANOCOBALAMIN IJ, Inject 1 Dose as directed every 30 (thirty) days., Disp: , Rfl:    empagliflozin (JARDIANCE) 10 MG TABS tablet, Take 1 tablet (10 mg total) by mouth daily., Disp: 30 tablet, Rfl: 11   famotidine (PEPCID) 20 MG tablet, Take 1 tablet (20 mg total) by mouth 2 (two) times daily., Disp: 14 tablet, Rfl: 0   insulin isophane & regular human KwikPen (NOVOLIN 70/30 KWIKPEN) (70-30) 100  UNIT/ML KwikPen, Inject 10 units twice a day, Disp: 45 mL, Rfl: 0   Insulin NPH Isophane & Regular (NOVOLIN 70/30 FLEXPEN [place]), Inject 10 Units into the skin 2 (two) times daily., Disp: , Rfl:    Insulin Pen Needle (TRUEPLUS PEN NEEDLES) 31G X 5 MM MISC, USE AS DIRECTED FOUR TIMES DAILY, Disp: 200 each, Rfl: 3   midodrine (PROAMATINE) 10 MG tablet, Take 1 tablet (10 mg total) by mouth 3 (three) times daily., Disp: 90 tablet, Rfl: 1   omega-3 acid ethyl esters (LOVAZA) 1 g capsule, Take 2 capsules (2 g total) by mouth 2 (two) times daily., Disp: 360 capsule, Rfl: 1   ondansetron (ZOFRAN-ODT) 4 MG disintegrating tablet, Take 1 tablet (4 mg total) by mouth every 8 (eight) hours as needed for nausea or vomiting., Disp: 20 tablet, Rfl: 0   Semaglutide,0.25 or 0.5MG /DOS, 2 MG/3ML SOPN, Inject 0.5 mg into the skin once a week., Disp: 3 mL, Rfl: 11   sildenafil (VIAGRA) 100 MG tablet, Take either 1 tab po prn or 1/2 tab po prn with 1/2 tab of tadalafil., Disp: 10 tablet, Rfl: 11   tadalafil (CIALIS) 20 MG tablet, 1 tab po prn or 1/2 tab po with 1/2 tab of sildenafil 100mg ., Disp: 10 tablet, Rfl: 11   Vitamin D, Ergocalciferol, (DRISDOL) 1.25 MG (50000 UNIT) CAPS capsule, Take 1 capsule (50,000 Units total) by mouth every 7 (seven) days., Disp: 4 capsule, Rfl: 2   Medications ordered  in this encounter:  No orders of the defined types were placed in this encounter.    *If you need refills on other medications prior to your next appointment, please contact your pharmacy*  Follow-Up: Call back or seek an in-person evaluation if the symptoms worsen or if the condition fails to improve as anticipated.  [facility] [phone]  Other Instructions miralax, Doculax, and glycerin supp- today and tomorrow  -hydration -push fluids -if not improved in 48 hours he is to be seen in person   If you have been instructed to have an in-person evaluation today at a local Urgent Care facility, please  use the link below. It will take you to a list of all of our available [facility], including address, phone number and hours of operation. Please do not delay care.  [facility]  If you or a family member do not have a primary care provider, use the link below to schedule a visit and establish care. When you choose a [facility] primary care physician or advanced practice provider, you gain a long-term partner in health. Find a Primary Care Provider  Learn more about [facility] in-office and virtual care options:  - Get Care Now

## 2022-09-30 NOTE — Progress Notes (Signed)
Virtual Visit Consent   Micheal Neal, you are scheduled for a virtual visit with a [facility] provider today. Just as with appointments in the office, your consent must be obtained to participate. Your consent will be active for this visit and any virtual visit you may have with one of our providers in the next 365 days. If you have a MyChart account, a copy of this consent can be sent to you electronically.  As this is a virtual visit, video technology does not allow for your provider to perform a traditional examination. This may limit your provider's ability to fully assess your condition. If your provider identifies any concerns that need to be evaluated in person or the need to arrange testing (such as labs, EKG, etc.), we will make arrangements to do so. Although advances in technology are sophisticated, we cannot ensure that it will always work on either your end or our end. If the connection with a video visit is poor, the visit may have to be switched to a telephone visit. With either a video or telephone visit, we are not always able to ensure that we have a secure connection.  By engaging in this virtual visit, you consent to the provision of healthcare and authorize for your insurance to be billed (if applicable) for the services provided during this visit. Depending on your insurance coverage, you may receive a charge related to this service.  I need to obtain your verbal consent now. Are you willing to proceed with your visit today? Micheal Neal has provided verbal consent on 09/30/2022 for a virtual visit (video or telephone). Freddy Finner, NP  Date: 09/30/2022 11:29 AM  Virtual Visit via Video Note   I, Freddy Finner, connected with  Micheal Neal  ([id number], Feb 04, 1971) on 09/30/22 at 11:00 AM EDT by a video-enabled telemedicine application and verified that I am speaking with the correct person using two identifiers.  Location: Patient: Virtual Visit Location Patient:  Home Provider: Virtual Visit Location Provider: Home Office   I discussed the limitations of evaluation and management by telemedicine and the availability of in person appointments. The patient expressed understanding and agreed to proceed.    History of Present Illness: Micheal Neal is a 52 y.o. who identifies as a male who was assigned male at birth, and is being seen today for constipation  Reports constipation for a week- prior to visit he was able to get a small amount of stool out after using a suppository last night. Used Miralax Fri and sat but then stopped. Has not used anything else as of now. Feels uncomfortable- but reports mild improvement after this small BM prior to visit.  Denies fevers, chills, back or pelvic pain, no other GI symptoms.   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   GIB (gastrointestinal bleeding) 07/14/2022   Gastrointestinal hemorrhage 07/13/2022   History of myocardial infarction within last 3 months 07/13/2022   S/P insertion of non-drug eluting coronary artery stent 07/13/2022   Hematochezia 07/12/2022   ABLA (acute blood loss anemia) 07/12/2022   B12 deficiency 07/11/2022   Normocytic anemia due to blood loss 07/11/2022   Encounter for hepatitis C screening test for low risk patient 07/11/2022   Nipple pain 07/11/2022   Vitamin D deficiency 06/06/2022   Coronary artery disease involving native coronary artery of native heart 06/06/2022   Erectile dysfunction 06/06/2022   Acute pain in right eye  05/30/2022   Redness of right eye 05/30/2022   Witnessed episode of apnea 05/30/2022   Charcot's joint of foot, left 05/30/2022   Acute conjunctivitis of right eye 05/30/2022   Diaphoresis 05/27/2022   AKI (acute kidney injury) (HCC) 05/21/2022   Morbid obesity (HCC) 05/20/2022   Hyperglycemia 05/19/2022   Syncope and collapse 05/19/2022   Non-STEMI (non-ST elevated myocardial  infarction) (HCC) 05/19/2022   Heart palpitations 01/21/2022   Orthostatic hypotension 01/21/2022   Dizzy spells 01/21/2022   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) 01/21/2022   Hypertriglyceridemia 11/07/2021   Diabetes 1.5, 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   Hyperlipidemia with target LDL less than 100 11/06/2021   Hypotension 11/06/2021   Abnormal electrocardiogram (ECG) (EKG) 11/06/2021   Dizziness 08/13/2021   Obesity (BMI 30-39.9) 05/24/2017   Insomnia 10/15/2014   Diabetic neuropathy (HCC) 10/14/2014   Allergic rhinitis 07/01/2014   Uncontrolled type 2 diabetes mellitus with hyperglycemia, with long-term current use of insulin (HCC) 02/20/2011    Allergies: No Known Allergies Medications:  Current Outpatient Medications:    testosterone cypionate (DEPOTESTOSTERONE CYPIONATE) 200 MG/ML injection, Inject 0.5 mLs (100 mg total) into the muscle once a week., Disp: 10 mL, Rfl: 1   aspirin EC 81 MG tablet, Take 1 tablet (81 mg total) by mouth daily. Swallow whole. (Patient taking differently: Take 81 mg by mouth daily.), Disp: 90 tablet, Rfl: 1   atorvastatin (LIPITOR) 80 MG tablet, Take 1 tablet (80 mg total) by mouth daily., Disp: 90 tablet, Rfl: 3   carvedilol (COREG) 3.125 MG tablet, Take 1 tablet (3.125 mg total) by mouth 2 (two) times daily with a meal., Disp: 60 tablet, Rfl: 3   clopidogrel (PLAVIX) 75 MG tablet, Take 4 tablets at same time (300 mg) on day 1, then take 1 tablet by mouth daily thereafter, Disp: 94 tablet, Rfl: 3   Continuous Glucose Sensor (DEXCOM G7 SENSOR) MISC, Use as directed. Change every 10 days., Disp: 9 each, Rfl: 3   CYANOCOBALAMIN IJ, Inject 1 Dose as directed every 30 (thirty) days., Disp: , Rfl:    empagliflozin (JARDIANCE) 10 MG TABS tablet, Take 1 tablet (10 mg total) by mouth daily., Disp: 30 tablet, Rfl: 11   famotidine (PEPCID) 20 MG tablet, Take 1 tablet (20 mg total) by mouth 2 (two) times daily., Disp: 14 tablet, Rfl: 0    insulin isophane & regular human KwikPen (NOVOLIN 70/30 KWIKPEN) (70-30) 100 UNIT/ML KwikPen, Inject 10 units twice a day, Disp: 45 mL, Rfl: 0   Insulin NPH Isophane & Regular (NOVOLIN 70/30 FLEXPEN [place]), Inject 10 Units into the skin 2 (two) times daily., Disp: , Rfl:    Insulin Pen Needle (TRUEPLUS PEN NEEDLES) 31G X 5 MM MISC, USE AS DIRECTED FOUR TIMES DAILY, Disp: 200 each, Rfl: 3   midodrine (PROAMATINE) 10 MG tablet, Take 1 tablet (10 mg total) by mouth 3 (three) times daily., Disp: 90 tablet, Rfl: 1   omega-3 acid ethyl esters (LOVAZA) 1 g capsule, Take 2 capsules (2 g total) by mouth 2 (two) times daily., Disp: 360 capsule, Rfl: 1   ondansetron (ZOFRAN-ODT) 4 MG disintegrating tablet, Take 1 tablet (4 mg total) by mouth every 8 (eight) hours as needed for nausea or vomiting., Disp: 20 tablet, Rfl: 0   Semaglutide,0.25 or 0.5MG /DOS, 2 MG/3ML SOPN, Inject 0.5 mg into the skin once a week., Disp: 3 mL, Rfl: 11   sildenafil (VIAGRA) 100 MG tablet, Take either 1 tab po prn or  1/2 tab po prn with 1/2 tab of tadalafil., Disp: 10 tablet, Rfl: 11   tadalafil (CIALIS) 20 MG tablet, 1 tab po prn or 1/2 tab po with 1/2 tab of sildenafil 100mg ., Disp: 10 tablet, Rfl: 11   Vitamin D, Ergocalciferol, (DRISDOL) 1.25 MG (50000 UNIT) CAPS capsule, Take 1 capsule (50,000 Units total) by mouth every 7 (seven) days., Disp: 4 capsule, Rfl: 2  Observations/Objective: Patient is well-developed, well-nourished in no acute distress.  Resting comfortably  at home.  Head is normocephalic, atraumatic.  No labored breathing.  Speech is clear and coherent with logical content.  Patient is alert and oriented at baseline.    Assessment and Plan:  1. Constipation, unspecified constipation type  -miralax, Doculax, and glycerin supp- today and tomorrow -he had the start of a BM prior to visit- -hydration -push fluids -if not improved in 48 hours he is to be seen in person   Reviewed side effects, risks and benefits  of medication.    Patient acknowledged agreement and understanding of the plan.   Past Medical, Surgical, Social History, Allergies, and Medications have been Reviewed.    Follow Up Instructions: I discussed the assessment and treatment plan with the patient. The patient was provided an opportunity to ask questions and all were answered. The patient agreed with the plan and demonstrated an understanding of the instructions.  A copy of instructions were sent to the patient via MyChart unless otherwise noted below.     The patient was advised to call back or seek an in-person evaluation if the symptoms worsen or if the condition fails to improve as anticipated.  Time:  I spent 10 minutes with the patient via telehealth technology discussing the above problems/concerns.    Freddy Finner, NP

## 2022-10-07 ENCOUNTER — Other Ambulatory Visit: Payer: Self-pay

## 2022-10-08 ENCOUNTER — Other Ambulatory Visit: Payer: Self-pay

## 2022-10-09 ENCOUNTER — Other Ambulatory Visit: Payer: Self-pay

## 2022-10-09 ENCOUNTER — Other Ambulatory Visit: Payer: Self-pay | Admitting: Cardiovascular Disease

## 2022-10-09 ENCOUNTER — Ambulatory Visit (INDEPENDENT_AMBULATORY_CARE_PROVIDER_SITE_OTHER): Payer: 59 | Admitting: Podiatry

## 2022-10-09 ENCOUNTER — Other Ambulatory Visit (HOSPITAL_COMMUNITY): Payer: Self-pay

## 2022-10-09 DIAGNOSIS — S90424A Blister (nonthermal), right lesser toe(s), initial encounter: Secondary | ICD-10-CM

## 2022-10-09 MED ORDER — MIDODRINE HCL 10 MG PO TABS
10.0000 mg | ORAL_TABLET | Freq: Three times a day (TID) | ORAL | 0 refills | Status: DC
  Filled 2022-10-09: qty 90, 30d supply, fill #0

## 2022-10-09 NOTE — Progress Notes (Signed)
Subjective:  Patient ID: Micheal Neal, male    DOB: 10/03/1970,  MRN: [id number]  Chief Complaint  Patient presents with   Wound Check    52 y.o. male presents with the above complaint.  Patient presents for follow-up of right hallux blister formation.  He states is doing better.  The Betadine has helped denies any other acute complaints   Review of Systems: Negative except as noted in the HPI. Denies N/V/F/Ch.  Past Medical History:  Diagnosis Date   Abnormal EKG    Anemia 07/11/2022   Contusion of left foot    Diabetes mellitus without complication (HCC)    Dizziness    Encounter for hepatitis C screening test for low risk patient 07/11/2022   GERD (gastroesophageal reflux disease)    Near syncope    Palpitations    Vertigo     Current Outpatient Medications:    testosterone cypionate (DEPOTESTOSTERONE CYPIONATE) 200 MG/ML injection, Inject 0.5 mLs (100 mg total) into the muscle once a week., Disp: 10 mL, Rfl: 1   aspirin EC 81 MG tablet, Take 1 tablet (81 mg total) by mouth daily. Swallow whole. (Patient taking differently: Take 81 mg by mouth daily.), Disp: 90 tablet, Rfl: 1   atorvastatin (LIPITOR) 80 MG tablet, Take 1 tablet (80 mg total) by mouth daily., Disp: 90 tablet, Rfl: 3   carvedilol (COREG) 3.125 MG tablet, Take 1 tablet (3.125 mg total) by mouth 2 (two) times daily with a meal., Disp: 60 tablet, Rfl: 3   clopidogrel (PLAVIX) 75 MG tablet, Take 4 tablets at same time (300 mg) on day 1, then take 1 tablet by mouth daily thereafter, Disp: 94 tablet, Rfl: 3   Continuous Glucose Sensor (DEXCOM G7 SENSOR) MISC, Use as directed. Change every 10 days., Disp: 9 each, Rfl: 3   CYANOCOBALAMIN IJ, Inject 1 Dose as directed every 30 (thirty) days., Disp: , Rfl:    empagliflozin (JARDIANCE) 10 MG TABS tablet, Take 1 tablet (10 mg total) by mouth daily., Disp: 30 tablet, Rfl: 11   famotidine (PEPCID) 20 MG tablet, Take 1 tablet (20 mg total) by mouth 2 (two) times daily., Disp:  14 tablet, Rfl: 0   insulin isophane & regular human KwikPen (NOVOLIN 70/30 KWIKPEN) (70-30) 100 UNIT/ML KwikPen, Inject 10 units twice a day, Disp: 45 mL, Rfl: 0   Insulin NPH Isophane & Regular (NOVOLIN 70/30 FLEXPEN [place]), Inject 10 Units into the skin 2 (two) times daily., Disp: , Rfl:    Insulin Pen Needle (TRUEPLUS PEN NEEDLES) 31G X 5 MM MISC, USE AS DIRECTED FOUR TIMES DAILY, Disp: 200 each, Rfl: 3   midodrine (PROAMATINE) 10 MG tablet, Take 1 tablet (10 mg total) by mouth 3 (three) times daily., Disp: 90 tablet, Rfl: 1   omega-3 acid ethyl esters (LOVAZA) 1 g capsule, Take 2 capsules (2 g total) by mouth 2 (two) times daily., Disp: 360 capsule, Rfl: 1   ondansetron (ZOFRAN-ODT) 4 MG disintegrating tablet, Take 1 tablet (4 mg total) by mouth every 8 (eight) hours as needed for nausea or vomiting., Disp: 20 tablet, Rfl: 0   Semaglutide,0.25 or 0.5MG /DOS, 2 MG/3ML SOPN, Inject 0.5 mg into the skin once a week., Disp: 3 mL, Rfl: 11   sildenafil (VIAGRA) 100 MG tablet, Take either 1 tab po prn or 1/2 tab po prn with 1/2 tab of tadalafil., Disp: 10 tablet, Rfl: 11   tadalafil (CIALIS) 20 MG tablet, 1 tab po prn or 1/2 tab po with 1/2 tab of  sildenafil 100mg ., Disp: 10 tablet, Rfl: 11   Vitamin D, Ergocalciferol, (DRISDOL) 1.25 MG (50000 UNIT) CAPS capsule, Take 1 capsule (50,000 Units total) by mouth every 7 (seven) days., Disp: 4 capsule, Rfl: 2  Social History   Tobacco Use  Smoking Status Never  Smokeless Tobacco Never    No Known Allergies Objective:  There were no vitals filed for this visit. There is no height or weight on file to calculate BMI. Constitutional Well developed. Well nourished.  Vascular Dorsalis pedis pulses palpable bilaterally. Posterior tibial pulses palpable bilaterally. Capillary refill normal to all digits.  No cyanosis or clubbing noted. Pedal hair growth normal.  Neurologic Normal speech. Oriented to person, place, and time. Epicritic sensation to light  touch grossly present bilaterally.  Dermatologic Right hallux blister formation noted without any open wounds.  Mild redness no signs of purulent drainage or malodor present.  No deep open wound noted.  Orthopedic: Normal joint ROM without pain or crepitus bilaterally. No visible deformities. No bony tenderness.   Radiographs: None Assessment:   No diagnosis found.  Plan:  Patient was evaluated and treated and all questions answered.  Right hallux blister~clinically improving All questions and concerns were discussed with the patient in extensive detail.   -At this time patient's wound has improved.  He will continue her surgical shoe and we will plan on doing triple antibiotic and a Band-Aid.  I will see him back again in 4 weeks to reevaluate.  Hopefully will be closed by then.

## 2022-10-11 ENCOUNTER — Other Ambulatory Visit: Payer: Self-pay

## 2022-10-11 ENCOUNTER — Other Ambulatory Visit (HOSPITAL_COMMUNITY): Payer: Self-pay

## 2022-10-11 ENCOUNTER — Encounter: Payer: Self-pay | Admitting: Cardiovascular Disease

## 2022-10-11 ENCOUNTER — Ambulatory Visit: Payer: 59 | Attending: Cardiovascular Disease | Admitting: Cardiovascular Disease

## 2022-10-11 VITALS — BP 75/48 | HR 86 | Ht 74.0 in | Wt 248.8 lb

## 2022-10-11 DIAGNOSIS — I951 Orthostatic hypotension: Secondary | ICD-10-CM

## 2022-10-11 DIAGNOSIS — I2510 Atherosclerotic heart disease of native coronary artery without angina pectoris: Secondary | ICD-10-CM | POA: Diagnosis not present

## 2022-10-11 MED ORDER — MIDODRINE HCL 10 MG PO TABS
10.0000 mg | ORAL_TABLET | Freq: Three times a day (TID) | ORAL | 3 refills | Status: DC
  Filled 2022-10-11: qty 270, 90d supply, fill #0
  Filled 2022-11-03: qty 90, 30d supply, fill #0
  Filled 2022-11-29: qty 90, 30d supply, fill #1
  Filled 2023-01-09: qty 90, 30d supply, fill #2
  Filled 2023-02-06: qty 90, 30d supply, fill #3
  Filled 2023-03-08: qty 90, 30d supply, fill #4
  Filled 2023-04-07: qty 90, 30d supply, fill #5
  Filled 2023-05-28: qty 90, 30d supply, fill #6
  Filled 2023-09-27: qty 90, 30d supply, fill #7

## 2022-10-11 NOTE — Patient Instructions (Signed)
Continue to hydrate well and increase sodium intake to help keep blood pressure up  Medication Instructions:  No changes *If you need a refill on your cardiac medications before your next appointment, please call your pharmacy*   Lab Work: none If you have labs (blood work) drawn today and your tests are completely normal, you will receive your results only by: MyChart Message (if you have MyChart) OR A paper copy in the mail If you have any lab test that is abnormal or we need to change your treatment, we will call you to review the results.   Testing/Procedures: none   Follow-Up: At [facility], you and your health needs are our priority.  As part of our continuing mission to provide you with exceptional heart care, we have created designated Provider Care Teams.  These Care Teams include your primary Cardiologist (physician) and Advanced Practice Providers (APPs -  Physician Assistants and Nurse Practitioners) who all work together to provide you with the care you need, when you need it.   Your next appointment:   6 month(s)  Provider:   Verne Carrow, MD

## 2022-10-11 NOTE — Progress Notes (Signed)
Chief Complaint  Patient presents with   Follow-up    CAD   History of Present Illness: 52 yo male with history of CAD, SVT, diabetes, hyperlipidemia, vertigo and orthostatic hypotension who is here today for follow up. I saw him as a new patient in October 2023 for the evaluation of near syncope. He was seen in the ED at [facility] in August 2023 with near syncope.  EKG without ischemic changes. BP was initially in the 70s systolic. Troponin negative. He was orthostatic in the ED and was given IV fluids. Nuclear stress test  11/27/21 with no ischemia. Normal LV function. Echo November 2023 with LVEF=60-65%. No valve disease. Cardiac monitor in November 2023 with sinus and 6 short runs of SVT.  He was seen in the ED on 05/19/2022 with complaint of 2 syncopal events.  Patient's blood glucose was elevated to 528. His orthostatic vitals were positive. EKG showed possible ST elevations inferiorly. Troponin up to 200s. Cardiac cath with severe posterolateral artery stenosis treated with a drug eluting stent. There was also a severe stenosis in the small caliber AV groove Circumflex that was felt to be too small for PCI. Moderate LAD and right PDA stenosis not felt to be flow limiting. He discharged on Brilinta and ASA. He presented to the ED at [facility] on 05/27/2022 with complaint of weakness with shortness of breath and sweatiness.  Cardiology was consulted and EKG showed sinus rhythm with T wave inversion inferolaterally. Blood glucose was 70 and troponins were 190, 186.  He denied chest pain.   He is here today for follow up. The patient denies any chest pain, dyspnea, palpitations, lower extremity edema, orthopnea, PND, dizziness, near syncope or syncope.   Primary Care Physician: [name], NP   Past Medical History:  Diagnosis Date   Abnormal EKG    Anemia 07/11/2022   CAD (coronary artery disease)    Contusion of left foot    Diabetes mellitus without complication (HCC)     Dizziness    Encounter for hepatitis C screening test for low risk patient 07/11/2022   GERD (gastroesophageal reflux disease)    Near syncope    Palpitations    Vertigo     Past Surgical History:  Procedure Laterality Date   BIOPSY  07/13/2022   Procedure: BIOPSY;  Surgeon: Jenel Lucks, MD;  Location: [facility] ENDOSCOPY;  Service: Gastroenterology;;   BIOPSY  07/15/2022   Procedure: BIOPSY;  Surgeon: Beverley Fiedler, MD;  Location: [facility] ENDOSCOPY;  Service: Gastroenterology;;   COLONOSCOPY N/A 07/15/2022   Procedure: COLONOSCOPY;  Surgeon: Beverley Fiedler, MD;  Location: [facility] ENDOSCOPY;  Service: Gastroenterology;  Laterality: N/A;   CORONARY STENT INTERVENTION N/A 05/20/2022   Procedure: CORONARY STENT INTERVENTION;  Surgeon: Runell Gess, MD;  Location: [facility] INVASIVE CV LAB;  Service: Cardiovascular;  Laterality: N/A;   ESOPHAGOGASTRODUODENOSCOPY N/A 07/13/2022   Procedure: ESOPHAGOGASTRODUODENOSCOPY (EGD);  Surgeon: Jenel Lucks, MD;  Location: [facility] ENDOSCOPY;  Service: Gastroenterology;  Laterality: N/A;   LEFT HEART CATH AND CORONARY ANGIOGRAPHY N/A 05/20/2022   Procedure: LEFT HEART CATH AND CORONARY ANGIOGRAPHY;  Surgeon: Runell Gess, MD;  Location: [facility] INVASIVE CV LAB;  Service: Cardiovascular;  Laterality: N/A;   NO PAST SURGERIES     No surgeries  Current Outpatient Medications  Medication Sig Dispense Refill   aspirin EC 81 MG tablet Take 1 tablet (81 mg total) by mouth daily. Swallow whole. (Patient taking differently: Take 81 mg  by mouth daily.) 90 tablet 1   atorvastatin (LIPITOR) 80 MG tablet Take 1 tablet (80 mg total) by mouth daily. 90 tablet 3   carvedilol (COREG) 3.125 MG tablet Take 1 tablet (3.125 mg total) by mouth 2 (two) times daily with a meal. 60 tablet 3   clopidogrel (PLAVIX) 75 MG tablet Take 4 tablets at same time (300 mg) on day 1, then take 1 tablet by mouth daily thereafter 94 tablet 3   Continuous Glucose Sensor (DEXCOM G7 SENSOR) MISC Use as  directed. Change every 10 days. 9 each 3   empagliflozin (JARDIANCE) 10 MG TABS tablet Take 1 tablet (10 mg total) by mouth daily. 30 tablet 11   famotidine (PEPCID) 20 MG tablet Take 1 tablet (20 mg total) by mouth 2 (two) times daily. 14 tablet 0   insulin isophane & regular human KwikPen (NOVOLIN 70/30 KWIKPEN) (70-30) 100 UNIT/ML KwikPen Inject 10 units twice a day 45 mL 0   Insulin Pen Needle (TRUEPLUS PEN NEEDLES) 31G X 5 MM MISC USE AS DIRECTED FOUR TIMES DAILY 200 each 3   omega-3 acid ethyl esters (LOVAZA) 1 g capsule Take 2 capsules (2 g total) by mouth 2 (two) times daily. 360 capsule 1   ondansetron (ZOFRAN-ODT) 4 MG disintegrating tablet Take 1 tablet (4 mg total) by mouth every 8 (eight) hours as needed for nausea or vomiting. 20 tablet 0   Semaglutide,0.25 or 0.5MG /DOS, 2 MG/3ML SOPN Inject 0.5 mg into the skin once a week. 3 mL 11   sildenafil (VIAGRA) 100 MG tablet Take either 1 tab po prn or 1/2 tab po prn with 1/2 tab of tadalafil. 10 tablet 11   tadalafil (CIALIS) 20 MG tablet 1 tab po prn or 1/2 tab po with 1/2 tab of sildenafil 100mg . 10 tablet 11   testosterone cypionate (DEPOTESTOSTERONE CYPIONATE) 200 MG/ML injection Inject 0.5 mLs (100 mg total) into the muscle once a week. 10 mL 1   Vitamin D, Ergocalciferol, (DRISDOL) 1.25 MG (50000 UNIT) CAPS capsule Take 1 capsule (50,000 Units total) by mouth every 7 (seven) days. 4 capsule 2   CYANOCOBALAMIN IJ Inject 1 Dose as directed every 30 (thirty) days. (Patient not taking: Reported on 10/11/2022)     midodrine (PROAMATINE) 10 MG tablet Take 1 tablet (10 mg total) by mouth 3 (three) times daily. 270 tablet 3   No current facility-administered medications for this visit.    No Known Allergies  Social History   Socioeconomic History   Marital status: Married    Spouse name: Not on file   Number of children: 4   Years of education: Not on file   Highest education level: Not on file  Occupational History   Occupation:  Maintenance Tech  Tobacco Use   Smoking status: Never   Smokeless tobacco: Never  Vaping Use   Vaping status: Never Used  Substance and Sexual Activity   Alcohol use: No   Drug use: No   Sexual activity: Yes    Partners: Female    Birth control/protection: None  Other Topics Concern   Not on file  Social History Narrative   Not on file   Social Determinants of Health   Financial Resource Strain: Not on file  Food Insecurity: No Food Insecurity (07/17/2022)   Hunger Vital Sign    Worried About Running Out of Food in the Last Year: Never true    Ran Out of Food in the Last Year: Never true  Transportation Needs: No  Transportation Needs (07/17/2022)   PRAPARE - Administrator, Civil Service (Medical): No    Lack of Transportation (Non-Medical): No  Physical Activity: Not on file  Stress: Not on file  Social Connections: Unknown (07/11/2022)   Social Connection and Isolation Panel [NHANES]    Frequency of Communication with Friends and Family: Patient declined    Frequency of Social Gatherings with Friends and Family: Patient declined    Attends Religious Services: Patient declined    Database administrator or Organizations: Patient declined    Attends Banker Meetings: Not on file    Marital Status: Patient declined  Intimate Partner Violence: Not At Risk (07/12/2022)   Humiliation, Afraid, Rape, and Kick questionnaire    Fear of Current or Ex-Partner: No    Emotionally Abused: No    Physically Abused: No    Sexually Abused: No    Family History  Problem Relation Age of Onset   Heart disease Mother    Breast cancer Maternal Grandmother        diagnosed late 50's   Breast cancer Maternal Aunt     Review of Systems:  As stated in the HPI and otherwise negative.   BP (!) 75/48   Pulse 86   Ht 6\' 2"  (1.88 m)   Wt 112.9 kg   SpO2 98%   BMI 31.94 kg/m   Physical Examination: General: Well developed, well nourished, NAD  HEENT: OP clear,  mucus membranes moist  SKIN: warm, dry. No rashes. Neuro: No focal deficits  Musculoskeletal: Muscle strength 5/5 all ext  Psychiatric: Mood and affect normal  Neck: No JVD, no carotid bruits, no thyromegaly, no lymphadenopathy.  Lungs:Clear bilaterally, no wheezes, rhonci, crackles Cardiovascular: Regular rate and rhythm. No murmurs, gallops or rubs. Abdomen:Soft. Bowel sounds present. Non-tender.  Extremities: No lower extremity edema. Pulses are 2 + in the bilateral DP/PT.  EKG:  EKG is not ordered today. The ekg ordered today demonstrates  Recent Labs: 04/25/2022: TSH 1.86 05/21/2022: Magnesium 2.0 07/12/2022: ALT 22 08/09/2022: BUN 21; Creatinine, Ser 1.30; Hemoglobin 12.8; Platelets 258.0; Potassium 4.4; Sodium 136   Lipid Panel    Component Value Date/Time   CHOL 241 (H) 05/21/2022 0236   CHOL 145 02/26/2022 0717   TRIG 455 (H) 05/21/2022 0236   HDL 31 (L) 05/21/2022 0236   HDL 31 (L) 02/26/2022 0717   CHOLHDL 7.8 05/21/2022 0236   VLDL UNABLE TO CALCULATE IF TRIGLYCERIDE OVER 400 mg/dL 16/12/9602 5409   LDLCALC UNABLE TO CALCULATE IF TRIGLYCERIDE OVER 400 mg/dL 81/19/1478 2956   LDLCALC 72 02/26/2022 0717   LDLDIRECT 121 (H) 05/21/2022 0236     Wt Readings from Last 3 Encounters:  10/11/22 112.9 kg  08/30/22 118.2 kg  08/09/22 118.4 kg    Assessment and Plan:   CAD without angina: No chest pain. Continue ASA, Plavix, beta blocker and statin. He was unable to get insurance coverage for Ranexa.   Orthostatic hypotension: No recent syncope. He is still getting dizzy during the day. He is instructed to push his po fluid intake, be liberal with salt intake, continue abdominal binder and compression stockings. Continue midodrine  Labs/ tests ordered today include:  No orders of the defined types were placed in this encounter.  Disposition:   F/U with me or office APP in 6 months  Signed, Verne Carrow, MD, [facility] 10/11/2022 11:41 AM    [facility]  [facility] [address] [place], [place], [place]  [address]  Phone: [phone]; Fax: [phone]

## 2022-10-12 ENCOUNTER — Other Ambulatory Visit: Payer: Self-pay | Admitting: Nurse Practitioner

## 2022-10-12 ENCOUNTER — Other Ambulatory Visit (HOSPITAL_COMMUNITY): Payer: Self-pay

## 2022-10-12 DIAGNOSIS — E559 Vitamin D deficiency, unspecified: Secondary | ICD-10-CM

## 2022-10-14 ENCOUNTER — Other Ambulatory Visit: Payer: Self-pay

## 2022-10-14 ENCOUNTER — Other Ambulatory Visit (HOSPITAL_COMMUNITY): Payer: Self-pay

## 2022-10-14 MED ORDER — VITAMIN D (ERGOCALCIFEROL) 1.25 MG (50000 UNIT) PO CAPS
50000.0000 [IU] | ORAL_CAPSULE | ORAL | 2 refills | Status: DC
Start: 2022-10-14 — End: 2023-03-13
  Filled 2022-10-14 – 2022-11-03 (×2): qty 4, 28d supply, fill #0
  Filled 2022-11-15 – 2022-11-29 (×2): qty 4, 28d supply, fill #1
  Filled 2022-12-09 – 2023-01-09 (×2): qty 4, 28d supply, fill #2

## 2022-10-15 ENCOUNTER — Other Ambulatory Visit: Payer: Self-pay

## 2022-10-15 ENCOUNTER — Other Ambulatory Visit: Payer: Self-pay | Admitting: Internal Medicine

## 2022-10-15 ENCOUNTER — Other Ambulatory Visit: Payer: Self-pay | Admitting: Nurse Practitioner

## 2022-10-15 ENCOUNTER — Encounter: Payer: Self-pay | Admitting: Hematology

## 2022-10-15 ENCOUNTER — Other Ambulatory Visit (HOSPITAL_COMMUNITY): Payer: Self-pay

## 2022-10-15 DIAGNOSIS — I2510 Atherosclerotic heart disease of native coronary artery without angina pectoris: Secondary | ICD-10-CM

## 2022-10-15 MED ORDER — ASPIRIN 81 MG PO TBEC
81.0000 mg | DELAYED_RELEASE_TABLET | Freq: Every day | ORAL | 1 refills | Status: DC
  Filled 2022-10-15: qty 30, 30d supply, fill #0
  Filled 2022-11-03 – 2022-11-15 (×2): qty 30, 30d supply, fill #1
  Filled 2022-11-29 – 2022-12-09 (×2): qty 30, 30d supply, fill #2
  Filled 2022-12-18: qty 30, 30d supply, fill #3
  Filled 2023-02-12: qty 30, 30d supply, fill #4
  Filled 2023-02-25 – 2023-03-13 (×2): qty 30, 30d supply, fill #5

## 2022-10-15 MED ORDER — CARVEDILOL 3.125 MG PO TABS
3.1250 mg | ORAL_TABLET | Freq: Two times a day (BID) | ORAL | 3 refills | Status: DC
Start: 2022-10-15 — End: 2023-02-22
  Filled 2022-10-15 – 2022-11-03 (×2): qty 60, 30d supply, fill #0
  Filled 2022-11-15 – 2022-11-29 (×2): qty 60, 30d supply, fill #1
  Filled 2022-12-18: qty 60, 30d supply, fill #2
  Filled 2023-01-30: qty 60, 30d supply, fill #3

## 2022-10-16 ENCOUNTER — Other Ambulatory Visit (HOSPITAL_COMMUNITY): Payer: Self-pay

## 2022-10-17 ENCOUNTER — Encounter: Payer: Self-pay | Admitting: Hematology

## 2022-10-17 ENCOUNTER — Telehealth: Payer: Self-pay

## 2022-10-17 ENCOUNTER — Other Ambulatory Visit (HOSPITAL_COMMUNITY): Payer: Self-pay

## 2022-10-17 ENCOUNTER — Other Ambulatory Visit: Payer: Self-pay

## 2022-10-17 NOTE — Telephone Encounter (Signed)
Ozempic needs PA  

## 2022-10-18 ENCOUNTER — Other Ambulatory Visit (HOSPITAL_COMMUNITY): Payer: Self-pay

## 2022-10-18 ENCOUNTER — Other Ambulatory Visit: Payer: Self-pay

## 2022-10-21 ENCOUNTER — Other Ambulatory Visit: Payer: Self-pay

## 2022-10-25 ENCOUNTER — Encounter: Payer: Self-pay | Admitting: Internal Medicine

## 2022-10-25 ENCOUNTER — Other Ambulatory Visit (HOSPITAL_COMMUNITY): Payer: Self-pay

## 2022-10-28 ENCOUNTER — Telehealth: Payer: Self-pay | Admitting: Pharmacy Technician

## 2022-10-28 ENCOUNTER — Other Ambulatory Visit (HOSPITAL_COMMUNITY): Payer: Self-pay

## 2022-10-28 NOTE — Telephone Encounter (Signed)
Pharmacy Patient Advocate Encounter   Received notification from Patient Advice Request messages that prior authorization for Ozempic is required/requested.   Insurance verification completed.   The patient is insured through [facility] .   Per test claim: PA required; PA submitted to [facility] via CoverMyMeds Key/confirmation #/EOC [facility] Status is pending

## 2022-10-28 NOTE — Telephone Encounter (Signed)
Looks like this is the first time it was sent to our team. The first note wasn't routed to [place]. I'll review though.

## 2022-10-29 ENCOUNTER — Other Ambulatory Visit (HOSPITAL_COMMUNITY): Payer: Self-pay

## 2022-10-29 ENCOUNTER — Other Ambulatory Visit: Payer: Self-pay

## 2022-10-29 MED ORDER — TRULICITY 0.75 MG/0.5ML ~~LOC~~ SOAJ
0.7500 mg | SUBCUTANEOUS | 3 refills | Status: DC
  Filled 2022-10-29 – 2022-11-04 (×2): qty 2, 28d supply, fill #0
  Filled 2022-11-29: qty 2, 28d supply, fill #1
  Filled 2022-12-18: qty 2, 28d supply, fill #2
  Filled 2023-01-25: qty 2, 28d supply, fill #3

## 2022-10-29 NOTE — Telephone Encounter (Signed)
Pharmacy Patient Advocate Encounter  Received notification from [employer] [facility] that Prior Authorization for Ozempic has been DENIED. Please advise how you'd like to proceed. Full denial letter will be uploaded to the media tab. See denial reason below.  Your plan only covers this drug when you meet one of these options: A) You have tried other products your plan covers (preferred products), and they did not work well for you, or B) Your doctor gives [place] a medical reason you cannot take those other products. For your plan, you may need to try up to three preferred products. We have denied your request because you do not meet any of these conditions. We reviewed the information we had. Your request has been denied. Your doctor can send [place] any new or missing information for [place] to review. The preferred products for your plan are: Trulicity; Victoza; liraglutide. Your doctor may need to get approval from your plan for preferred products. For this drug, you may have to meet other criteria. You can request the drug policy for more details. You can also request other plan documents for your review.   Victoza or Trulicity is preferred by the ins.  If suggested medication is appropriate, Please send in a new RX and discontinue this one. If not, please advise as to why it's not appropriate so that we may request a Prior Authorization.  Both of these will also require a prior authorization/step therapy.

## 2022-10-29 NOTE — Telephone Encounter (Signed)
I LVM for a returned call as well as sent patient a mychart message

## 2022-10-29 NOTE — Addendum Note (Signed)
Addended by: Scarlette Shorts on: 10/29/2022 10:54 AM   Modules accepted: Orders

## 2022-11-01 ENCOUNTER — Other Ambulatory Visit (HOSPITAL_COMMUNITY): Payer: Self-pay

## 2022-11-04 ENCOUNTER — Other Ambulatory Visit (HOSPITAL_COMMUNITY): Payer: Self-pay

## 2022-11-04 ENCOUNTER — Other Ambulatory Visit: Payer: Self-pay

## 2022-11-05 ENCOUNTER — Other Ambulatory Visit: Payer: Self-pay

## 2022-11-06 ENCOUNTER — Other Ambulatory Visit (HOSPITAL_COMMUNITY): Payer: Self-pay

## 2022-11-06 ENCOUNTER — Ambulatory Visit: Payer: 59 | Admitting: Podiatry

## 2022-11-09 ENCOUNTER — Other Ambulatory Visit (HOSPITAL_COMMUNITY): Payer: Self-pay

## 2022-11-14 ENCOUNTER — Ambulatory Visit (INDEPENDENT_AMBULATORY_CARE_PROVIDER_SITE_OTHER): Payer: 59 | Admitting: Urology

## 2022-11-14 ENCOUNTER — Encounter (INDEPENDENT_AMBULATORY_CARE_PROVIDER_SITE_OTHER): Payer: Self-pay

## 2022-11-14 VITALS — BP 99/69 | HR 98 | Ht 74.0 in | Wt 248.0 lb

## 2022-11-14 DIAGNOSIS — E291 Testicular hypofunction: Secondary | ICD-10-CM | POA: Diagnosis not present

## 2022-11-14 DIAGNOSIS — N5201 Erectile dysfunction due to arterial insufficiency: Secondary | ICD-10-CM

## 2022-11-14 NOTE — Progress Notes (Signed)
Subjective: 1. Hypogonadism in male   2. Erectile dysfunction due to arterial insufficiency       11/14/22: Micheal Neal returns today in f/u.  His testosterone level was 220 on labs after his last visit and he was sent testosterone cypionate to use 100mg  IM weekly.  He has not noticed a benefit yet. He is also here to assess his response to sildenafil for the ED.   He didn't respond to either sildenafil or tadalafil alone or in combination.  He continues to have ejaculatory dysfunction.   08/15/22: Micheal Neal is a 52 yo male who presents for evaluation of ED.  He reports progressive issues over the past year.  He is not able to get any tumescence.  He can reach a climax but has no ejaculate.  He has a good libido.  He has had a history of low T and elevated estradiol and gynecomastia on the right.  He has intermittent pain and firmness on the right.  He has had no prior treatment except cialis which didn't work.  He has lost 12 lb since February after an MI.  He had a stent placed.   He has neuropathy.  He is voiding well with an IPSS of 5 and nocturia x 3.  He is on Plavix but no NTG.  ROS:  Review of Systems  Neurological:  Positive for dizziness.  All other systems reviewed and are negative.   No Known Allergies  Past Medical History:  Diagnosis Date   Abnormal EKG    Anemia 07/11/2022   CAD (coronary artery disease)    Contusion of left foot    Diabetes mellitus without complication (HCC)    Dizziness    Encounter for hepatitis C screening test for low risk patient 07/11/2022   GERD (gastroesophageal reflux disease)    Near syncope    Palpitations    Vertigo     Past Surgical History:  Procedure Laterality Date   BIOPSY  07/13/2022   Procedure: BIOPSY;  Surgeon: Jenel Lucks, MD;  Location: [facility] ENDOSCOPY;  Service: Gastroenterology;;   BIOPSY  07/15/2022   Procedure: BIOPSY;  Surgeon: Beverley Fiedler, MD;  Location: [facility] ENDOSCOPY;  Service: Gastroenterology;;   COLONOSCOPY N/A  07/15/2022   Procedure: COLONOSCOPY;  Surgeon: Beverley Fiedler, MD;  Location: [facility] ENDOSCOPY;  Service: Gastroenterology;  Laterality: N/A;   CORONARY STENT INTERVENTION N/A 05/20/2022   Procedure: CORONARY STENT INTERVENTION;  Surgeon: Runell Gess, MD;  Location: [facility] INVASIVE CV LAB;  Service: Cardiovascular;  Laterality: N/A;   ESOPHAGOGASTRODUODENOSCOPY N/A 07/13/2022   Procedure: ESOPHAGOGASTRODUODENOSCOPY (EGD);  Surgeon: Jenel Lucks, MD;  Location: [facility] ENDOSCOPY;  Service: Gastroenterology;  Laterality: N/A;   LEFT HEART CATH AND CORONARY ANGIOGRAPHY N/A 05/20/2022   Procedure: LEFT HEART CATH AND CORONARY ANGIOGRAPHY;  Surgeon: Runell Gess, MD;  Location: [facility] INVASIVE CV LAB;  Service: Cardiovascular;  Laterality: N/A;   NO PAST SURGERIES      Social History   Socioeconomic History   Marital status: Married    Spouse name: Not on file   Number of children: 4   Years of education: Not on file   Highest education level: Not on file  Occupational History   Occupation: Maintenance Tech  Tobacco Use   Smoking status: Never   Smokeless tobacco: Never  Vaping Use   Vaping status: Never Used  Substance and Sexual Activity   Alcohol use: No   Drug use: No   Sexual activity: Yes  Partners: Female    Birth control/protection: None  Other Topics Concern   Not on file  Social History Narrative   Not on file   Social Determinants of Health   Financial Resource Strain: Not on file  Food Insecurity: No Food Insecurity (07/17/2022)   Hunger Vital Sign    Worried About Running Out of Food in the Last Year: Never true    Ran Out of Food in the Last Year: Never true  Transportation Needs: No Transportation Needs (07/17/2022)   PRAPARE - Administrator, Civil Service (Medical): No    Lack of Transportation (Non-Medical): No  Physical Activity: Not on file  Stress: Not on file  Social Connections: Unknown (07/11/2022)   Social Connection and Isolation Panel  [NHANES]    Frequency of Communication with Friends and Family: Patient declined    Frequency of Social Gatherings with Friends and Family: Patient declined    Attends Religious Services: Patient declined    Database administrator or Organizations: Patient declined    Attends Banker Meetings: Not on file    Marital Status: Patient declined  Intimate Partner Violence: Not At Risk (07/12/2022)   Humiliation, Afraid, Rape, and Kick questionnaire    Fear of Current or Ex-Partner: No    Emotionally Abused: No    Physically Abused: No    Sexually Abused: No    Family History  Problem Relation Age of Onset   Heart disease Mother    Breast cancer Maternal Grandmother        diagnosed late 75's   Breast cancer Maternal Aunt     Anti-infectives: Anti-infectives (From admission, onward)    None       Current Outpatient Medications  Medication Sig Dispense Refill   aspirin EC 81 MG tablet Take 1 tablet (81 mg total) by mouth daily. Swallow whole. 90 tablet 1   atorvastatin (LIPITOR) 80 MG tablet Take 1 tablet (80 mg total) by mouth daily. 90 tablet 3   carvedilol (COREG) 3.125 MG tablet Take 1 tablet (3.125 mg total) by mouth 2 (two) times daily with a meal. 60 tablet 3   clopidogrel (PLAVIX) 75 MG tablet Take 4 tablets at same time (300 mg) on day 1, then take 1 tablet by mouth daily thereafter 94 tablet 3   Continuous Glucose Sensor (DEXCOM G7 SENSOR) MISC Use as directed. Change every 10 days. 9 each 3   CYANOCOBALAMIN IJ Inject 1 Dose as directed every 30 (thirty) days.     Dulaglutide (TRULICITY) 0.75 MG/0.5ML SOPN Inject 0.75 mg into the skin once a week. 6 mL 3   empagliflozin (JARDIANCE) 10 MG TABS tablet Take 1 tablet (10 mg total) by mouth daily. 30 tablet 11   insulin isophane & regular human KwikPen (NOVOLIN 70/30 KWIKPEN) (70-30) 100 UNIT/ML KwikPen Inject 10 units twice a day 45 mL 0   Insulin Pen Needle (TRUEPLUS PEN NEEDLES) 31G X 5 MM MISC USE AS DIRECTED  FOUR TIMES DAILY 200 each 3   midodrine (PROAMATINE) 10 MG tablet Take 1 tablet (10 mg total) by mouth 3 (three) times daily. 270 tablet 3   omega-3 acid ethyl esters (LOVAZA) 1 g capsule Take 2 capsules (2 g total) by mouth 2 (two) times daily. 360 capsule 1   ondansetron (ZOFRAN-ODT) 4 MG disintegrating tablet Take 1 tablet (4 mg total) by mouth every 8 (eight) hours as needed for nausea or vomiting. 20 tablet 0   sildenafil (VIAGRA) 100 MG  tablet Take either 1 tab po prn or 1/2 tab po prn with 1/2 tab of tadalafil. 10 tablet 11   tadalafil (CIALIS) 20 MG tablet 1 tab po prn or 1/2 tab po with 1/2 tab of sildenafil 100mg . 10 tablet 11   testosterone cypionate (DEPOTESTOSTERONE CYPIONATE) 200 MG/ML injection Inject 0.5 mLs (100 mg total) into the muscle once a week. Discard vial after use. 10 mL 1   Vitamin D, Ergocalciferol, (DRISDOL) 1.25 MG (50000 UNIT) CAPS capsule Take 1 capsule (50,000 Units total) by mouth every 7 (seven) days. 4 capsule 2   famotidine (PEPCID) 20 MG tablet Take 1 tablet (20 mg total) by mouth 2 (two) times daily. 14 tablet 0   No current facility-administered medications for this visit.     Objective: Vital signs in last 24 hours: BP 99/69   Pulse 98   Ht 6\' 2"  (1.88 m)   Wt 248 lb (112.5 kg)   BMI 31.84 kg/m   Intake/Output from previous day: No intake/output data recorded. Intake/Output this shift: @IOTHISSHIFT @   Physical Exam  Lab Results:  Results for orders placed or performed in visit on 11/14/22 (from the past 24 hour(s))  Testosterone     Status: None   Collection Time: 11/14/22  3:08 PM  Result Value Ref Range   Testosterone 715 264 - 916 ng/dL   Narrative   Performed at:  [address] [place] [address] [place], [place]  [id number] Lab Director: Jolene Schimke MD, Phone:  [phone]  Hemoglobin and hematocrit, blood     Status: None   Collection Time: 11/14/22  3:08 PM  Result Value Ref Range   Hemoglobin 14.7 13.0 - 17.7 g/dL    Hematocrit 95.2 84.1 - 51.0 %   Narrative   Performed at:  [address], [place], [place]  [id number] Lab Director: Jolene Schimke MD, Phone:  [phone]     BMET No results for input(s): "NA", "K", "CL", "CO2", "GLUCOSE", "BUN", "CREATININE", "CALCIUM" in the last 72 hours. PT/INR No results for input(s): "LABPROT", "INR" in the last 72 hours. ABG No results for input(s): "PHART", "HCO3" in the last 72 hours.  Invalid input(s): "PCO2", "PO2" UA has 3+ glucose.  Recent labs reviewed including testosterone panel.  Studies/Results: No results found.   Assessment/Plan: ED.  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  He didn't respond to oral meds.  I discussed again injection therapy, VED and IPP.     Hypogonadism.  He has started the testosterone but hasn't noticed much benefit.  I will get labs today and prior to f/u in 3 months.   No orders of the defined types were placed in this encounter.    Orders Placed This Encounter  Procedures   Testosterone   Hemoglobin and hematocrit, blood   Testosterone    Standing Status:   Future    Standing Expiration Date:   05/17/2023   Hemoglobin and hematocrit, blood    Standing Status:   Future    Standing Expiration Date:   05/17/2023     Return in about 3 months (around 02/14/2023) for with labs.    CC: [name]     Bjorn Pippin 11/15/2022

## 2022-11-15 ENCOUNTER — Other Ambulatory Visit: Payer: Self-pay

## 2022-11-15 ENCOUNTER — Other Ambulatory Visit (HOSPITAL_COMMUNITY): Payer: Self-pay

## 2022-11-15 LAB — HEMOGLOBIN AND HEMATOCRIT, BLOOD
Hematocrit: 44.9 % (ref 37.5–51.0)
Hemoglobin: 14.7 g/dL (ref 13.0–17.7)

## 2022-11-15 LAB — TESTOSTERONE: Testosterone: 715 ng/dL (ref 264–916)

## 2022-11-18 ENCOUNTER — Other Ambulatory Visit: Payer: Self-pay

## 2022-11-29 ENCOUNTER — Other Ambulatory Visit: Payer: Self-pay

## 2022-12-05 ENCOUNTER — Other Ambulatory Visit (HOSPITAL_COMMUNITY): Payer: Self-pay

## 2022-12-06 ENCOUNTER — Ambulatory Visit (INDEPENDENT_AMBULATORY_CARE_PROVIDER_SITE_OTHER): Payer: 59 | Admitting: Gastroenterology

## 2022-12-06 ENCOUNTER — Encounter: Payer: Self-pay | Admitting: Gastroenterology

## 2022-12-06 ENCOUNTER — Other Ambulatory Visit: Payer: Self-pay

## 2022-12-06 ENCOUNTER — Other Ambulatory Visit (HOSPITAL_COMMUNITY): Payer: Self-pay

## 2022-12-06 VITALS — BP 118/80 | HR 79 | Ht 74.0 in | Wt 259.0 lb

## 2022-12-06 DIAGNOSIS — Z86010 Personal history of colonic polyps: Secondary | ICD-10-CM

## 2022-12-06 DIAGNOSIS — Z8719 Personal history of other diseases of the digestive system: Secondary | ICD-10-CM

## 2022-12-06 DIAGNOSIS — K5900 Constipation, unspecified: Secondary | ICD-10-CM

## 2022-12-06 DIAGNOSIS — K2100 Gastro-esophageal reflux disease with esophagitis, without bleeding: Secondary | ICD-10-CM

## 2022-12-06 MED ORDER — PANTOPRAZOLE SODIUM 40 MG PO TBEC
40.0000 mg | DELAYED_RELEASE_TABLET | Freq: Every day | ORAL | 3 refills | Status: DC
  Filled 2022-12-06: qty 30, 30d supply, fill #0
  Filled 2022-12-18: qty 30, 30d supply, fill #1
  Filled 2023-01-30: qty 30, 30d supply, fill #2
  Filled 2023-02-25: qty 30, 30d supply, fill #3
  Filled 2023-04-07: qty 30, 30d supply, fill #4
  Filled 2023-04-16 – 2023-05-15 (×3): qty 30, 30d supply, fill #5
  Filled 2023-06-13 – 2023-08-07 (×3): qty 30, 30d supply, fill #6

## 2022-12-06 NOTE — Patient Instructions (Addendum)
Please purchase Metamucil over the counter. Take as directed.   We will call you in January to schedule a follow up appointment for March.  We have sent the following medications to your pharmacy for you to pick up at your convenience: Pantoprazole 40 mg   If your blood pressure at your visit was 140/90 or greater, please contact your primary care physician to follow up on this.  _______________________________________________________  If you are age 50 or older, your body mass index should be between 23-30. Your Body mass index is 33.25 kg/m. If this is out of the aforementioned range listed, please consider follow up with your Primary Care Provider.  If you are age 68 or younger, your body mass index should be between 19-25. Your Body mass index is 33.25 kg/m. If this is out of the aformentioned range listed, please consider follow up with your Primary Care Provider.   ________________________________________________________  The [facility] providers would like to encourage you to use [facility] to communicate with providers for non-urgent requests or questions.  Due to long hold times on the telephone, sending your provider a message by [facility] may be a faster and more efficient way to get a response.  Please allow 48 business hours for a response.  Please remember that this is for non-urgent requests.   It was a pleasure to see you today!  Thank you for trusting me with your gastrointestinal care!    Scott E.Tomasa Rand, MD

## 2022-12-06 NOTE — Progress Notes (Unsigned)
HPI : Micheal Neal is a 52 y.o. male with a history of coronary artery disease status post drug-eluting stent in February 2024 and diabetes who presents to clinic for follow-up of hospitalization in March of this year for obscure GI bleeding.  He had presented to the emergency department with both black, tarry stools as well as maroon-colored stools and found to have a hemoglobin of 9 down from baseline 12.  A CTA was negative for active bleeding.  An upper endoscopy by myself on April 15 showed LA grade a esophagitis but did not show an obvious bleeding source.  He then underwent a colonoscopy by Dr. Rhea Belton which showed nodular appearing mucosa at the ileocecal valve and 2 small polyps which were not removed (patient was on dual antiplatelet therapy due to recent stent), but no other possible bleeding sources.  Biopsies of the ileocecal valve were unremarkable.  Today, he denies the recurrence of any bloody appearing stools.  His hemoglobin has been followed as an outpatient, and has returned to his baseline (hemoglobin 14.7 August 15). He does report having symptoms of heartburn and acid regurgitation, as well as occasional solid dysphagia.  He reports having dysphagia symptoms for the past year, but seems to be worsening now.  He estimates having symptoms at least twice a week.  His reflux symptoms sometimes induce episodes of vomiting.  He takes an over-the-counter medication as needed for his heartburn symptoms (does not know the name).  He is no longer taking Protonix.  He has been having some issues with constipation, manifested by infrequent stools/urges to defecate.  He is not taking any medications to help with his constipation currently.  He is currently on aspirin and Plavix.     Colonoscopy July 15, 2022 (Dr. Rhea Belton) Impression - Nodular and abnormal mucosa at IC valve. Biopsied (rule out neoplasia).  - The examined portion of the terminal ileum was normal (1-2 cm examined).  - Two  small polyps in the descending colon and in the transverse colon. Resection not attempted. These can be addressed/removed once Brilinta can be held (after Feb 2025).  - Diminutive internal hemorrhoids.   EGD July 13, 2022 (Dr. Tomasa Rand) Impression - The examined portions of the nasopharynx, oropharynx and larynx were normal.  - LA Grade A reflux esophagitis with no bleeding. Very unlikely that this would explain his anemia/bloody stools  - Normal stomach. Biopsied.  - Normal examined duodenum.  - No abnormalities to explain patient's GI bleeding  Past Medical History:  Diagnosis Date   Abnormal EKG    Anemia 07/11/2022   CAD (coronary artery disease)    Contusion of left foot    Diabetes mellitus without complication (HCC)    Dizziness    Encounter for hepatitis C screening test for low risk patient 07/11/2022   GERD (gastroesophageal reflux disease)    Near syncope    Palpitations    Vertigo      Past Surgical History:  Procedure Laterality Date   BIOPSY  07/13/2022   Procedure: BIOPSY;  Surgeon: Jenel Lucks, MD;  Location: [facility] ENDOSCOPY;  Service: Gastroenterology;;   BIOPSY  07/15/2022   Procedure: BIOPSY;  Surgeon: Beverley Fiedler, MD;  Location: [facility] ENDOSCOPY;  Service: Gastroenterology;;   COLONOSCOPY N/A 07/15/2022   Procedure: COLONOSCOPY;  Surgeon: Beverley Fiedler, MD;  Location: [facility] ENDOSCOPY;  Service: Gastroenterology;  Laterality: N/A;   CORONARY STENT INTERVENTION N/A 05/20/2022   Procedure: CORONARY STENT INTERVENTION;  Surgeon: Runell Gess, MD;  Location: [facility] INVASIVE CV LAB;  Service: Cardiovascular;  Laterality: N/A;   ESOPHAGOGASTRODUODENOSCOPY N/A 07/13/2022   Procedure: ESOPHAGOGASTRODUODENOSCOPY (EGD);  Surgeon: Jenel Lucks, MD;  Location: [facility] ENDOSCOPY;  Service: Gastroenterology;  Laterality: N/A;   LEFT HEART CATH AND CORONARY ANGIOGRAPHY N/A 05/20/2022   Procedure: LEFT HEART CATH AND CORONARY ANGIOGRAPHY;  Surgeon: Runell Gess, MD;   Location: [facility] INVASIVE CV LAB;  Service: Cardiovascular;  Laterality: N/A;   NO PAST SURGERIES     Family History  Problem Relation Age of Onset   Heart disease Mother    Breast cancer Maternal Grandmother        diagnosed late 24's   Breast cancer Maternal Aunt    Social History   Tobacco Use   Smoking status: Never   Smokeless tobacco: Never  Vaping Use   Vaping status: Never Used  Substance Use Topics   Alcohol use: No   Drug use: No   Current Outpatient Medications  Medication Sig Dispense Refill   aspirin EC 81 MG tablet Take 1 tablet (81 mg total) by mouth daily. Swallow whole. 90 tablet 1   atorvastatin (LIPITOR) 80 MG tablet Take 1 tablet (80 mg total) by mouth daily. 90 tablet 3   carvedilol (COREG) 3.125 MG tablet Take 1 tablet (3.125 mg total) by mouth 2 (two) times daily with a meal. 60 tablet 3   clopidogrel (PLAVIX) 75 MG tablet Take 4 tablets at same time (300 mg) on day 1, then take 1 tablet by mouth daily thereafter 94 tablet 3   Continuous Glucose Sensor (DEXCOM G7 SENSOR) MISC Use as directed. Change every 10 days. 9 each 3   CYANOCOBALAMIN IJ Inject 1 Dose as directed every 30 (thirty) days.     Dulaglutide (TRULICITY) 0.75 MG/0.5ML SOPN Inject 0.75 mg into the skin once a week. 6 mL 3   empagliflozin (JARDIANCE) 10 MG TABS tablet Take 1 tablet (10 mg total) by mouth daily. 30 tablet 11   famotidine (PEPCID) 20 MG tablet Take 1 tablet (20 mg total) by mouth 2 (two) times daily. 14 tablet 0   insulin isophane & regular human KwikPen (NOVOLIN 70/30 KWIKPEN) (70-30) 100 UNIT/ML KwikPen Inject 10 units twice a day 45 mL 0   Insulin Pen Needle (TRUEPLUS PEN NEEDLES) 31G X 5 MM MISC USE AS DIRECTED FOUR TIMES DAILY 200 each 3   midodrine (PROAMATINE) 10 MG tablet Take 1 tablet (10 mg total) by mouth 3 (three) times daily. 270 tablet 3   omega-3 acid ethyl esters (LOVAZA) 1 g capsule Take 2 capsules (2 g total) by mouth 2 (two) times daily. 360 capsule 1   ondansetron  (ZOFRAN-ODT) 4 MG disintegrating tablet Take 1 tablet (4 mg total) by mouth every 8 (eight) hours as needed for nausea or vomiting. 20 tablet 0   sildenafil (VIAGRA) 100 MG tablet Take either 1 tab po prn or 1/2 tab po prn with 1/2 tab of tadalafil. 10 tablet 11   tadalafil (CIALIS) 20 MG tablet 1 tab po prn or 1/2 tab po with 1/2 tab of sildenafil 100mg . 10 tablet 11   testosterone cypionate (DEPOTESTOSTERONE CYPIONATE) 200 MG/ML injection Inject 0.5 mLs (100 mg total) into the muscle once a week. Discard vial after use. 10 mL 1   Vitamin D, Ergocalciferol, (DRISDOL) 1.25 MG (50000 UNIT) CAPS capsule Take 1 capsule (50,000 Units total) by mouth every 7 (seven) days. 4 capsule 2   No current facility-administered medications for this visit.  No Known Allergies   Review of Systems: All systems reviewed and negative except where noted in HPI.    No results found.  Physical Exam: BP 118/80   Pulse 79   Ht 6\' 2"  (1.88 m)   Wt 259 lb (117.5 kg)   BMI 33.25 kg/m  Constitutional: Pleasant,well-developed, Hispanic male in no acute distress. HEENT: Normocephalic and atraumatic. Conjunctivae are normal. No scleral icterus. Neck supple.  Cardiovascular: Normal rate, regular rhythm.  Pulmonary/chest: Effort normal and breath sounds normal. No wheezing, rales or rhonchi. Abdominal: Soft, nondistended, nontender. Bowel sounds active throughout. There are no masses palpable. No hepatomegaly. Extremities: no edema Lymphadenopathy: No cervical adenopathy noted. Neurological: Alert and oriented to person place and time. Skin: Skin is warm and dry. No rashes noted. Psychiatric: Normal mood and affect. Behavior is normal.  CBC    Component Value Date/Time   WBC 6.1 08/09/2022 1525   RBC 4.38 08/09/2022 1525   HGB 14.7 11/14/2022 1508   HCT 44.9 11/14/2022 1508   PLT 258.0 08/09/2022 1525   PLT 256 08/13/2021 2040   MCV 87.9 08/09/2022 1525   MCV 86 08/13/2021 2040   MCH 27.9 07/24/2022  1616   MCHC 33.4 08/09/2022 1525   RDW 15.5 08/09/2022 1525   RDW 12.8 08/13/2021 2040   LYMPHSABS 1.5 07/12/2022 1243   LYMPHSABS 2.5 08/13/2021 2040   MONOABS 0.3 07/12/2022 1243   EOSABS 0.1 07/12/2022 1243   EOSABS 0.1 08/13/2021 2040   BASOSABS 0.0 07/12/2022 1243   BASOSABS 0.0 08/13/2021 2040    CMP     Component Value Date/Time   NA 136 08/09/2022 1525   NA 135 02/26/2022 0717   K 4.4 08/09/2022 1525   CL 100 08/09/2022 1525   CO2 28 08/09/2022 1525   GLUCOSE 218 (H) 08/09/2022 1525   BUN 21 08/09/2022 1525   BUN 22 02/26/2022 0717   CREATININE 1.30 08/09/2022 1525   CREATININE 1.32 05/29/2016 1654   CALCIUM 9.6 08/09/2022 1525   PROT 7.4 07/12/2022 1243   PROT 7.3 02/26/2022 0717   ALBUMIN 4.5 08/15/2022 1255   AST 19 07/12/2022 1243   ALT 22 07/12/2022 1243   ALKPHOS 96 07/12/2022 1243   BILITOT 0.7 07/12/2022 1243   BILITOT 0.4 02/26/2022 0717   GFRNONAA >60 07/15/2022 0417   GFRNONAA 66 05/29/2016 1654   GFRAA >60 11/13/2018 1622   GFRAA 76 05/29/2016 1654       Latest Ref Rng & Units 11/14/2022    3:08 PM 08/09/2022    3:25 PM 07/24/2022    4:16 PM  CBC EXTENDED  WBC 4.0 - 10.5 K/uL  6.1  4.8   RBC 4.22 - 5.81 Mil/uL  4.38  3.66    3.73   Hemoglobin 13.0 - 17.7 g/dL 16.1  09.6  04.5   HCT 37.5 - 51.0 % 44.9  38.5  33.6   Platelets 150.0 - 400.0 K/uL  258.0  330    Component Ref Range & Units 3 mo ago (08/09/22) 3 mo ago (08/09/22) 4 mo ago (07/24/22) 4 mo ago (07/24/22) 4 mo ago (07/11/22) 4 mo ago (07/11/22)  Iron 50 - 180 mcg/dL 409 811 R  49 R  69 R  TIBC 250 - 425 mcg/dL (calc) 914   782 High  R    %SAT 20 - 48 % (calc) 31       Ferritin 38 - 380 ng/mL 198  12 Low  R, CM  53.9 R  ASSESSMENT AND PLAN:  52 year old male with history of diabetes and coronary artery disease status post drug-eluting stent in February 2024, admitted in March with obscure GI bleeding.  A definitive source of bleeding was not identified on EGD or  colonoscopy.  He did have mild reflux esophagitis, but seems unlikely he would have had a significant bleed from this.  He has not had any evidence of further bleeding since that hospitalization, and has no history of prior GI bleeding. We discussed further evaluation of a bleeding source to include a capsule endoscopy.  Although the yield of a capsule endoscopy is likely to be low in the absence of any bleeding for the past 5 months, I did offer it to make sure there is nothing concerning in the small bowel.  The patient preferred to defer a capsule endoscopy for now, and wait to see if he has any recurrence of bleeding. He does have frequent GERD symptoms and some mild solid dysphagia.  A definitive stricture was not seen on his upper endoscopy, but he did have mild esophagitis.  He warrants PPI therapy given the presence of esophagitis on EGD.  We will restart Protonix 40 mg once daily.  If his dysphagia continues to be an issue on PPI, we can schedule him for repeat EGD with dilation.  We should do this after February 2025, when his Plavix can be safely held for an endoscopic procedure. Similarly, the patient had 2 polyps found on his colonoscopy in March which were not removed due to his being on dual antiplatelet therapy.  We can repeat a colonoscopy after February 2025, again when his Plavix can be safely held.  We discussed the pathophysiology of GERD and the principles of GERD management to include lifestyle modifications  such as dietary discretion (avoidance of alcohol, tobacco, caffeinated and carbonated beverages, spicy/greasy foods, citrus, peppermint/chocolate), weight loss if applicable, head of bed elevation andconsuming last meal of day within 3 hours of bedtime; pharmacologic options to include PPIs, H2RAs and OTC antacids; and finally surgical or endoscopic fundoplication. For his constipation, I recommended he start taking Metamucil on a daily basis to improve his stool bulk and  consistency.  GERD with esophagitis - Start Protonix 40 mg PO daily  Dysphagia, possibly related to esophagitis versus low-grade Schatzki ring/stricture - Consider repeat EGD with dilation at follow up if symptoms persist on Protonix  Constipation - Daily metamucil  Obscure GI Bleed - Hold off on VCE for now  Colon polyp - f/u in March, schedule for colonoscopy once able to safely hold Plavix  Falana Clagg E. Tomasa Rand, MD [facility]  I spent a total of 45 minutes reviewing the patient's medical record, interviewing and examining the patient, discussing her diagnosis and management of her condition going forward, and documenting in the medical record  [name], NP

## 2022-12-09 ENCOUNTER — Other Ambulatory Visit: Payer: Self-pay

## 2022-12-10 ENCOUNTER — Other Ambulatory Visit (HOSPITAL_COMMUNITY): Payer: Self-pay

## 2022-12-10 ENCOUNTER — Other Ambulatory Visit: Payer: Self-pay

## 2022-12-12 ENCOUNTER — Ambulatory Visit (INDEPENDENT_AMBULATORY_CARE_PROVIDER_SITE_OTHER): Payer: 59 | Admitting: Nurse Practitioner

## 2022-12-12 ENCOUNTER — Encounter: Payer: Self-pay | Admitting: Nurse Practitioner

## 2022-12-12 ENCOUNTER — Other Ambulatory Visit: Payer: Self-pay | Admitting: Nurse Practitioner

## 2022-12-12 VITALS — BP 98/66 | HR 89 | Temp 97.9°F | Ht 74.0 in | Wt 259.4 lb

## 2022-12-12 DIAGNOSIS — E559 Vitamin D deficiency, unspecified: Secondary | ICD-10-CM | POA: Diagnosis not present

## 2022-12-12 DIAGNOSIS — R17 Unspecified jaundice: Secondary | ICD-10-CM

## 2022-12-12 LAB — COMPREHENSIVE METABOLIC PANEL
ALT: 29 U/L (ref 0–53)
AST: 23 U/L (ref 0–37)
Albumin: 4.4 g/dL (ref 3.5–5.2)
Alkaline Phosphatase: 85 U/L (ref 39–117)
BUN: 21 mg/dL (ref 6–23)
CO2: 25 meq/L (ref 19–32)
Calcium: 9.4 mg/dL (ref 8.4–10.5)
Chloride: 100 meq/L (ref 96–112)
Creatinine, Ser: 1.23 mg/dL (ref 0.40–1.50)
GFR: 68.52 mL/min (ref >60.00)
Glucose, Bld: 140 mg/dL — ABNORMAL HIGH (ref 70–99)
Potassium: 4.2 meq/L (ref 3.5–5.1)
Sodium: 136 meq/L (ref 135–145)
Total Bilirubin: 1.5 mg/dL — ABNORMAL HIGH (ref 0.2–1.2)
Total Protein: 8 g/dL (ref 6.0–8.3)

## 2022-12-12 LAB — VITAMIN D 25 HYDROXY (VIT D DEFICIENCY, FRACTURES): VITD: 34.53 ng/mL (ref 30.00–100.00)

## 2022-12-12 NOTE — Progress Notes (Signed)
   Established Patient Office Visit  Subjective   Patient ID: Micheal Neal, male    DOB: 07-Mar-1971  Age: 52 y.o. MRN: [id number]  Chief Complaint  Patient presents with   vitamin d deficiency     Vitamin D deficiency: Last serum level collected 4 months ago was 20.  Patient currently on weekly ergocalciferol.  Tolerating well.  Patient continues to follow-up with cardiology, endocrinology, gastroenterology, and podiatry for management of his chronic conditions as well.  Overall he reports he is feeling well, does have dizzy spells but they are occurring less frequently than they were before.  He also mentions his recent foot wound has now healed.    Review of Systems  Respiratory:  Negative for shortness of breath.   Cardiovascular:  Negative for chest pain and palpitations.  Neurological:  Positive for dizziness.      Objective:     BP 98/66   Pulse 89   Temp 97.9 F (36.6 C) (Temporal)   Ht 6\' 2"  (1.88 m)   Wt 259 lb 6 oz (117.7 kg)   SpO2 98%   BMI 33.30 kg/m  BP Readings from Last 3 Encounters:  12/12/22 98/66  12/06/22 118/80  11/14/22 99/69   Wt Readings from Last 3 Encounters:  12/12/22 259 lb 6 oz (117.7 kg)  12/06/22 259 lb (117.5 kg)  11/14/22 248 lb (112.5 kg)      Physical Exam Vitals reviewed.  Constitutional:      Appearance: Normal appearance.  HENT:     Head: Normocephalic and atraumatic.  Cardiovascular:     Rate and Rhythm: Normal rate and regular rhythm.  Pulmonary:     Effort: Pulmonary effort is normal.     Breath sounds: Normal breath sounds.  Musculoskeletal:     Cervical back: Neck supple.  Skin:    General: Skin is warm and dry.  Neurological:     Mental Status: He is alert and oriented to person, place, and time.  Psychiatric:        Mood and Affect: Mood normal.        Behavior: Behavior normal.        Thought Content: Thought content normal.        Judgment: Judgment normal.      No results found for any visits on  12/12/22.    The ASCVD Risk score (Arnett DK, et al., 2019) failed to calculate for the following reasons:   The patient has a prior MI or stroke diagnosis    Assessment & Plan:   Problem List Items Addressed This Visit       Other   Vitamin D deficiency - Primary    Chronic Check serum vitamin D level today, further recommendations may be made based on the results.      Relevant Orders   VITAMIN D 25 Hydroxy (Vit-D Deficiency, Fractures)   Comprehensive metabolic panel    Return in about 3 months (around 03/13/2023) for 3-6 months CPE with Maralyn Sago.    [name], NP

## 2022-12-12 NOTE — Assessment & Plan Note (Signed)
Chronic Check serum vitamin D level today, further recommendations may be made based on the results.

## 2022-12-19 ENCOUNTER — Other Ambulatory Visit (HOSPITAL_BASED_OUTPATIENT_CLINIC_OR_DEPARTMENT_OTHER): Payer: Self-pay

## 2022-12-19 ENCOUNTER — Encounter: Payer: Self-pay | Admitting: Hematology

## 2022-12-25 ENCOUNTER — Emergency Department (HOSPITAL_COMMUNITY)
Admission: EM | Admit: 2022-12-25 | Discharge: 2022-12-25 | Disposition: A | Discharge status: Home / Self Care | Payer: 59 | Attending: Emergency Medicine | Admitting: Emergency Medicine

## 2022-12-25 ENCOUNTER — Encounter: Payer: Self-pay | Admitting: Hematology

## 2022-12-25 ENCOUNTER — Other Ambulatory Visit: Payer: Self-pay

## 2022-12-25 DIAGNOSIS — I2510 Atherosclerotic heart disease of native coronary artery without angina pectoris: Secondary | ICD-10-CM | POA: Diagnosis not present

## 2022-12-25 DIAGNOSIS — Z7982 Long term (current) use of aspirin: Secondary | ICD-10-CM | POA: Insufficient documentation

## 2022-12-25 DIAGNOSIS — Z7901 Long term (current) use of anticoagulants: Secondary | ICD-10-CM | POA: Insufficient documentation

## 2022-12-25 DIAGNOSIS — I10 Essential (primary) hypertension: Secondary | ICD-10-CM | POA: Diagnosis not present

## 2022-12-25 DIAGNOSIS — E119 Type 2 diabetes mellitus without complications: Secondary | ICD-10-CM | POA: Diagnosis not present

## 2022-12-25 DIAGNOSIS — E1140 Type 2 diabetes mellitus with diabetic neuropathy, unspecified: Secondary | ICD-10-CM | POA: Diagnosis not present

## 2022-12-25 DIAGNOSIS — R030 Elevated blood-pressure reading, without diagnosis of hypertension: Secondary | ICD-10-CM | POA: Diagnosis not present

## 2022-12-25 DIAGNOSIS — R202 Paresthesia of skin: Secondary | ICD-10-CM | POA: Diagnosis not present

## 2022-12-25 LAB — CBC WITH DIFFERENTIAL/PLATELET
Abs Immature Granulocytes: 0.02 10*3/uL (ref 0.00–0.07)
Basophils Absolute: 0 10*3/uL (ref 0.0–0.1)
Basophils Relative: 1 %
Eosinophils Absolute: 0.2 10*3/uL (ref 0.0–0.5)
Eosinophils Relative: 2 %
HCT: 44.6 % (ref 39.0–52.0)
Hemoglobin: 14.7 g/dL (ref 13.0–17.0)
Immature Granulocytes: 0 %
Lymphocytes Relative: 33 %
Lymphs Abs: 2.2 10*3/uL (ref 0.7–4.0)
MCH: 28.2 pg (ref 26.0–34.0)
MCHC: 33 g/dL (ref 30.0–36.0)
MCV: 85.4 fL (ref 80.0–100.0)
Monocytes Absolute: 0.4 10*3/uL (ref 0.1–1.0)
Monocytes Relative: 6 %
Neutro Abs: 4 10*3/uL (ref 1.7–7.7)
Neutrophils Relative %: 58 %
Platelets: 238 10*3/uL (ref 150–400)
RBC: 5.22 MIL/uL (ref 4.22–5.81)
RDW: 13.4 % (ref 11.5–15.5)
WBC: 6.8 10*3/uL (ref 4.0–10.5)
nRBC: 0 % (ref 0.0–0.2)

## 2022-12-25 LAB — COMPREHENSIVE METABOLIC PANEL
ALT: 29 U/L (ref 0–44)
AST: 21 U/L (ref 15–41)
Albumin: 4.2 g/dL (ref 3.5–5.0)
Alkaline Phosphatase: 78 U/L (ref 38–126)
Anion gap: 11 (ref 5–15)
BUN: 23 mg/dL — ABNORMAL HIGH (ref 6–20)
CO2: 24 mmol/L (ref 22–32)
Calcium: 9 mg/dL (ref 8.9–10.3)
Chloride: 100 mmol/L (ref 98–111)
Creatinine, Ser: 1.27 mg/dL — ABNORMAL HIGH (ref 0.61–1.24)
GFR, Estimated: >60 mL/min (ref >60)
Glucose, Bld: 205 mg/dL — ABNORMAL HIGH (ref 70–99)
Potassium: 4.2 mmol/L (ref 3.5–5.1)
Sodium: 135 mmol/L (ref 135–145)
Total Bilirubin: 1.2 mg/dL (ref 0.3–1.2)
Total Protein: 7.7 g/dL (ref 6.5–8.1)

## 2022-12-25 NOTE — ED Triage Notes (Addendum)
Pt c/o "burning sensation" on top of his head and left arm numbness. Reports his BP being elevated 179/103 PTA. Denies any chest pain, SOB or vision changes

## 2022-12-25 NOTE — ED Provider Notes (Signed)
[place] EMERGENCY DEPARTMENT AT [facility] Provider Note  CSN: [id number] Arrival date & time: 12/25/22 4098  Chief Complaint(s) Hypertension  HPI Micheal Neal is a 52 y.o. male with past medical history as below, significant for CAD, DM, dizziness, non-STEMI, anemia who presents to the ED with complaint of elevated blood pressure, burning sensation to his scalp and tingling to his left arm.  Symptom onset upon waking this morning, have since resolved.  Sensation lasted for approximately 10 to 15 minutes resolved spontaneously.  He did stand up quickly this morning and felt lightheaded sat down and symptoms resolved.  Reports intermittent paresthesias, tingling to his arms over the past few years, sensation this morning feels similar to his experience in the past.  No numbness or weakness to extremities.  No significant neck pain.  Has not taken his blood pressure medication yet this morning.  Headache has resolved  Past Medical History Past Medical History:  Diagnosis Date   Abnormal EKG    AKI (acute kidney injury) (HCC) 05/21/2022   Anemia 07/11/2022   CAD (coronary artery disease)    Contusion of left foot    Diabetes mellitus without complication (HCC)    Dizziness    Encounter for hepatitis C screening test for low risk patient 07/11/2022   Gastrointestinal hemorrhage 07/13/2022   GERD (gastroesophageal reflux disease)    History of myocardial infarction within last 3 months 07/13/2022   Near syncope    Non-STEMI (non-ST elevated myocardial infarction) (HCC) 05/19/2022   Palpitations    Syncope and collapse 05/19/2022   Vertigo    Patient Active Problem List   Diagnosis Date Noted   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   S/P insertion of non-drug eluting coronary artery stent 07/13/2022   ABLA (acute blood loss anemia) 07/12/2022   B12 deficiency 07/11/2022   Vitamin D deficiency 06/06/2022   Coronary artery disease involving native coronary  artery of native heart 06/06/2022   Erectile dysfunction 06/06/2022   Charcot's joint of foot, left 05/30/2022   Orthostatic hypotension 01/21/2022   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) 01/21/2022   Hypertriglyceridemia 11/07/2021   Diabetes 1.5, 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 Authorizing Provider  aspirin EC 81 MG tablet Take 1 tablet (81 mg total) by mouth daily. Swallow whole. 10/15/22   Etta Grandchild, MD  atorvastatin (LIPITOR) 80 MG tablet Take 1 tablet (80 mg total) by mouth daily. 05/22/22 05/17/23  Almon Hercules, MD  carvedilol (COREG) 3.125 MG tablet Take 1 tablet (3.125 mg total) by mouth 2 (two) times daily with a meal. 10/15/22   [name], NP  clopidogrel (PLAVIX) 75 MG tablet Take 4 tablets at same time (300 mg) on day 1, then take 1 tablet by mouth daily thereafter 07/24/22   Runell Gess, MD  Continuous Glucose Sensor (DEXCOM G7 SENSOR) MISC Use as directed. Change every 10 days. 05/31/22   Shamleffer, Konrad Dolores, MD  CYANOCOBALAMIN IJ Inject 1 Dose as directed every 30 (thirty) days.    [provider]  Dulaglutide (TRULICITY) 0.75 MG/0.5ML SOPN Inject 0.75 mg into the skin once a week. 10/29/22  Shamleffer, Konrad Dolores, MD  empagliflozin (JARDIANCE) 10 MG TABS tablet Take 1 tablet (10 mg total) by mouth daily. 08/30/22   Shamleffer, Konrad Dolores, MD  insulin isophane & regular human KwikPen (NOVOLIN 70/30 KWIKPEN) (70-30) 100 UNIT/ML KwikPen Inject 10 units twice a day 08/12/22   [name], NP  Insulin Pen Needle (TRUEPLUS PEN NEEDLES) 31G X 5 MM MISC USE AS DIRECTED FOUR TIMES DAILY 05/31/22    Shamleffer, Konrad Dolores, MD  midodrine (PROAMATINE) 10 MG tablet Take 1 tablet (10 mg total) by mouth 3 (three) times daily. 10/11/22   Kathleene Hazel, MD  omega-3 acid ethyl esters (LOVAZA) 1 g capsule Take 2 capsules (2 g total) by mouth 2 (two) times daily. 11/07/21   Etta Grandchild, MD  ondansetron (ZOFRAN-ODT) 4 MG disintegrating tablet Take 1 tablet (4 mg total) by mouth every 8 (eight) hours as needed for nausea or vomiting. 09/25/22   Valentino Nose, NP  pantoprazole (PROTONIX) 40 MG tablet Take 1 tablet (40 mg total) by mouth daily. 12/06/22   Jenel Lucks, MD  sildenafil (VIAGRA) 100 MG tablet Take either 1 tab po prn or 1/2 tab po prn with 1/2 tab of tadalafil. 08/15/22   Bjorn Pippin, MD  tadalafil (CIALIS) 20 MG tablet 1 tab po prn or 1/2 tab po with 1/2 tab of sildenafil 100mg . 08/15/22   Bjorn Pippin, MD  testosterone cypionate (DEPOTESTOSTERONE CYPIONATE) 200 MG/ML injection Inject 0.5 mLs (100 mg total) into the muscle once a week. Discard vial after use. 09/02/22   Bjorn Pippin, MD  Vitamin D, Ergocalciferol, (DRISDOL) 1.25 MG (50000 UNIT) CAPS capsule Take 1 capsule (50,000 Units total) by mouth every 7 (seven) days. 10/14/22   [name], NP                                                                                                                                    Past Surgical History Past Surgical History:  Procedure Laterality Date   BIOPSY  07/13/2022   Procedure: BIOPSY;  Surgeon: Jenel Lucks, MD;  Location: [facility] ENDOSCOPY;  Service: Gastroenterology;;   BIOPSY  07/15/2022   Procedure: BIOPSY;  Surgeon: Beverley Fiedler, MD;  Location: [facility] ENDOSCOPY;  Service: Gastroenterology;;   COLONOSCOPY N/A 07/15/2022   Procedure: COLONOSCOPY;  Surgeon: Beverley Fiedler, MD;  Location: [facility] ENDOSCOPY;  Service: Gastroenterology;  Laterality: N/A;   CORONARY STENT INTERVENTION N/A 05/20/2022   Procedure: CORONARY STENT INTERVENTION;  Surgeon: Runell Gess, MD;   Location: [facility] INVASIVE CV LAB;  Service: Cardiovascular;  Laterality: N/A;   ESOPHAGOGASTRODUODENOSCOPY N/A 07/13/2022   Procedure: ESOPHAGOGASTRODUODENOSCOPY (EGD);  Surgeon: Jenel Lucks, MD;  Location: [facility] ENDOSCOPY;  Service: Gastroenterology;  Laterality: N/A;   LEFT HEART CATH AND CORONARY ANGIOGRAPHY N/A 05/20/2022   Procedure: LEFT HEART CATH AND CORONARY ANGIOGRAPHY;  Surgeon: Runell Gess, MD;  Location: [facility] INVASIVE CV LAB;  Service: Cardiovascular;  Laterality: N/A;   NO PAST SURGERIES     Family History Family History  Problem Relation Age of Onset   Heart disease Mother    Breast cancer Maternal Grandmother        diagnosed late 82's   Breast cancer Maternal Aunt     Social History Social History   Tobacco Use   Smoking status: Never   Smokeless tobacco: Never  Vaping Use   Vaping status: Never Used  Substance Use Topics   Alcohol use: No   Drug use: No   Allergies Patient has no known allergies.  Review of Systems Review of Systems  Constitutional:  Negative for chills.  Eyes:  Negative for photophobia and visual disturbance.  Respiratory:  Negative for chest tightness and shortness of breath.   Cardiovascular:  Negative for chest pain and palpitations.  Gastrointestinal:  Negative for abdominal pain, nausea and vomiting.  Genitourinary:  Negative for difficulty urinating.  Neurological:  Positive for headaches. Negative for weakness, light-headedness and numbness.  All other systems reviewed and are negative.   Physical Exam Vital Signs  I have reviewed the triage vital signs BP 113/79   Pulse 78   Temp 98.2 F (36.8 C)   Resp 18   SpO2 97%  Physical Exam Vitals and nursing note reviewed.  Constitutional:      General: He is not in acute distress.    Appearance: Normal appearance. He is well-developed. He is not ill-appearing.  HENT:     Head: Normocephalic and atraumatic.     Right Ear: External ear normal.     Left Ear: External  ear normal.     Mouth/Throat:     Mouth: Mucous membranes are moist.  Eyes:     General: No scleral icterus.    Extraocular Movements: Extraocular movements intact.     Pupils: Pupils are equal, round, and reactive to light.  Cardiovascular:     Rate and Rhythm: Normal rate and regular rhythm.     Pulses: Normal pulses.     Heart sounds: Normal heart sounds.  Pulmonary:     Effort: Pulmonary effort is normal. No respiratory distress.     Breath sounds: Normal breath sounds.  Abdominal:     General: Abdomen is flat.     Palpations: Abdomen is soft.     Tenderness: There is no abdominal tenderness.  Musculoskeletal:     Cervical back: No rigidity.     Right lower leg: No edema.     Left lower leg: No edema.  Skin:    General: Skin is warm and dry.     Capillary Refill: Capillary refill takes less than 2 seconds.  Neurological:     Mental Status: He is alert and oriented to person, place, and time.     GCS: GCS eye subscore is 4. GCS verbal subscore is 5. GCS motor subscore is 6.     Cranial Nerves: Cranial nerves 2-12 are intact. No cranial nerve deficit or dysarthria.     Sensory: Sensation is intact. No sensory deficit.     Motor: Motor function is intact. No weakness or tremor.     Coordination: Coordination is intact. Finger-Nose-Finger Test normal.     Gait: Gait is intact.     Comments: Strength 5/5 bilateral upper and lower extremities symmetric   Psychiatric:        Mood and Affect: Mood normal.        Behavior: Behavior normal.  ED Results and Treatments Labs (all labs ordered are listed, but only abnormal results are displayed) Labs Reviewed  COMPREHENSIVE METABOLIC PANEL - Abnormal; Notable for the following components:      Result Value   Glucose, Bld 205 (*)    BUN 23 (*)    Creatinine, Ser 1.27 (*)    All other components within normal limits  CBC WITH DIFFERENTIAL/PLATELET                                                                                                                           Radiology No results found.  Pertinent labs & imaging results that were available during my care of the patient were reviewed by me and considered in my medical decision making (see MDM for details).  Medications Ordered in ED Medications - No data to display                                                                                                                                   Procedures Procedures  (including critical care time)  Medical Decision Making / ED Course    Medical Decision Making:    Sarath Sullinger is a 52 y.o. male with past medical history as below, significant for CAD, DM, dizziness, non-STEMI, anemia who presents to the ED with complaint of elevated blood pressure, burning sensation to his scalp and tingling to his left arm.. The complaint involves an extensive differential diagnosis and also carries with it a high risk of complications and morbidity.  Serious etiology was considered. Ddx includes but is not limited to: Differential diagnosis includes but is not exclusive to subarachnoid hemorrhage, meningitis, encephalitis, previous head trauma, cavernous venous thrombosis, muscle tension headache, glaucoma, temporal arteritis, migraine or migraine equivalent, abolish derangement, lecture light disturbance, etc.   Complete initial physical exam performed, notably the patient  was asymptomatic, blood pressure improved without intervention.    Reviewed and confirmed nursing documentation for past medical history, family history, social history.  Vital signs reviewed.    Clinical Course as of 12/25/22 0835  Wed Dec 25, 2022  0732 Creatinine(!): 1.27 Similar to prior  [SG]    Clinical Course User Index [SG] Sloan Leiter, DO     Patient here with transient headache, arm tingling, elevated blood pressure.  Symptoms had resolved upon arrival without intervention.  Exam and history not consistent with CVA.  EKG  unremarkable/similar to prior tracing, no chest pain, ACS seems less likely  Patient with no weakness or numbness.  No strength deficits on exam.  Does report transient paresthesia to his left lower extremity which resolved spontaneously which he has experienced in the past intermittently over the past few years.  He is also had recurrent lightheadedness or dizziness upon standing intermittently over the past few years, roughly unchanged today  Blood pressure has nearly normalized no intervention  He does not demonstrate evidence of endorgan damage or hypertensive emergency, advised to continue home regimen and f/u with PCP  The patient improved significantly and was discharged in stable condition. Detailed discussions were had with the patient regarding current findings, and need for close f/u with PCP or on call doctor. The patient has been instructed to return immediately if the symptoms worsen in any way for re-evaluation. Patient verbalized understanding and is in agreement with current care plan. All questions answered prior to discharge.                  Additional history obtained: -Additional history obtained from spouse -External records from outside source obtained and reviewed including: Chart review including previous notes, labs, imaging, consultation notes including  Home medications, prior labs and imaging   Lab Tests: -I ordered, reviewed, and interpreted labs.   The pertinent results include:   Labs Reviewed  COMPREHENSIVE METABOLIC PANEL - Abnormal; Notable for the following components:      Result Value   Glucose, Bld 205 (*)    BUN 23 (*)    Creatinine, Ser 1.27 (*)    All other components within normal limits  CBC WITH DIFFERENTIAL/PLATELET    Notable for labs stable  EKG   EKG Interpretation Date/Time:  Wednesday December 25 2022 06:46:41 EDT Ventricular Rate:  85 PR Interval:  172 QRS Duration:  88 QT Interval:  348 QTC Calculation: 414 R  Axis:   10  Text Interpretation: Sinus rhythm Abnormal R-wave progression, early transition Borderline abnrm T, anterolateral leads Confirmed by Tanda Rockers ([id number]) on 12/25/2022 7:07:33 AM         Imaging Studies ordered: na   Medicines ordered and prescription drug management: No orders of the defined types were placed in this encounter.   -I have reviewed the patients home medicines and have made adjustments as needed   Consultations Obtained: na   Cardiac Monitoring: The patient was maintained on a cardiac monitor.  I personally viewed and interpreted the cardiac monitored which showed an underlying rhythm of: NSR  Social Determinants of Health:  Diagnosis or treatment significantly limited by social determinants of health: non smoker   Reevaluation: After the interventions noted above, I reevaluated the patient and found that they have resolved  Co morbidities that complicate the patient evaluation  Past Medical History:  Diagnosis Date   Abnormal EKG    AKI (acute kidney injury) (HCC) 05/21/2022   Anemia 07/11/2022   CAD (coronary artery disease)    Contusion of left foot    Diabetes mellitus without complication (HCC)    Dizziness    Encounter for hepatitis C screening test for low risk patient 07/11/2022   Gastrointestinal hemorrhage 07/13/2022   GERD (gastroesophageal reflux disease)    History of myocardial infarction within last 3 months 07/13/2022   Near syncope    Non-STEMI (non-ST elevated myocardial infarction) (HCC) 05/19/2022   Palpitations    Syncope and collapse 05/19/2022   Vertigo       Dispostion: Disposition  decision including need for hospitalization was considered, and patient discharged from emergency department.    Final Clinical Impression(s) / ED Diagnoses Final diagnoses:  Elevated blood pressure reading  Paresthesia        Sloan Leiter, DO 12/25/22 [phone]

## 2022-12-25 NOTE — ED Provider Notes (Signed)
Emergency Medicine Provider Triage Evaluation Note  Micheal Neal , a 52 y.o. male  was evaluated in triage.  Pt complains of burning in head, numbness in left hand for about 10-15 minutes. No other neurologic changes. Companion at bedside thought he looked pale and like he didn't feel well. BP of 179/103. Resolved on way here, reoccurred for a couple minutes, now back to normal.   Review of Systems  Positive: above Negative: Chest pain, illnesses, syncope, facial droop, strength changes  Physical Exam  BP (!) 156/138   Pulse 82   Temp 98.2 F (36.8 C)   Resp 19   SpO2 99%  Gen:   Awake, no distress   Resp:  Normal effort  MSK:   Moves extremities without difficulty  Other:  Sensation to light touch symmetric in hands, motor in BUE intact.   Medical Decision Making  Medically screening exam initiated at 6:48 AM.  Appropriate orders placed.  Orry Mancini was informed that the remainder of the evaluation will be completed by another provider, this initial triage assessment does not replace that evaluation, and the importance of remaining in the ED until their evaluation is complete.  Not c/w CVA or other acute emergent etiology requiring immediate intervention. Cbc/bmp/ecg ordered, defer to oncoming provider to decide on imaging after repeat evaluation.    Ardit Danh, Barbara Cower, MD 12/25/22 [phone]

## 2022-12-25 NOTE — Discharge Instructions (Addendum)
It was a pleasure caring for you today in the emergency department.  Your lab workup today looks re-assuring   Please follow up with your pcp  Be sure to get plenty of rest and stay hydrated  Please return to the emergency department for any worsening or worrisome symptoms.

## 2022-12-25 NOTE — ED Notes (Signed)
See triage notes. Pt states head burning and numbness down left arm  is still present but better. Pt denies chest pain/tightness/pressure, denies shob/n/v/d/dizziness/weakness on one side/trouble swallowing or speaking. Pt a/o. Color wnl. Moving all extremities. Smile symmetrical, hand grips equal. Sensation equal to leg/arm/face. NAD>

## 2022-12-30 ENCOUNTER — Other Ambulatory Visit (HOSPITAL_COMMUNITY): Payer: Self-pay

## 2022-12-31 ENCOUNTER — Other Ambulatory Visit: Payer: Self-pay

## 2023-01-09 ENCOUNTER — Other Ambulatory Visit: Payer: Self-pay | Admitting: Internal Medicine

## 2023-01-09 ENCOUNTER — Other Ambulatory Visit: Payer: Self-pay

## 2023-01-09 DIAGNOSIS — E781 Pure hyperglyceridemia: Secondary | ICD-10-CM

## 2023-01-10 ENCOUNTER — Other Ambulatory Visit: Payer: Self-pay

## 2023-01-10 ENCOUNTER — Encounter: Payer: Self-pay | Admitting: Nurse Practitioner

## 2023-01-10 NOTE — Progress Notes (Signed)
This encounter was created in error - please disregard.

## 2023-01-25 ENCOUNTER — Other Ambulatory Visit (HOSPITAL_COMMUNITY): Payer: Self-pay

## 2023-01-28 ENCOUNTER — Other Ambulatory Visit: Payer: Self-pay

## 2023-01-30 ENCOUNTER — Encounter: Payer: Self-pay | Admitting: Hematology

## 2023-01-30 ENCOUNTER — Other Ambulatory Visit (HOSPITAL_COMMUNITY): Payer: Self-pay

## 2023-02-06 ENCOUNTER — Other Ambulatory Visit: Payer: Self-pay

## 2023-02-06 ENCOUNTER — Ambulatory Visit (INDEPENDENT_AMBULATORY_CARE_PROVIDER_SITE_OTHER): Payer: 59 | Admitting: Internal Medicine

## 2023-02-06 ENCOUNTER — Encounter: Payer: Self-pay | Admitting: Internal Medicine

## 2023-02-06 ENCOUNTER — Other Ambulatory Visit (HOSPITAL_COMMUNITY): Payer: Self-pay

## 2023-02-06 VITALS — BP 122/80 | HR 79 | Ht 74.0 in | Wt 263.0 lb

## 2023-02-06 DIAGNOSIS — E1165 Type 2 diabetes mellitus with hyperglycemia: Secondary | ICD-10-CM | POA: Diagnosis not present

## 2023-02-06 DIAGNOSIS — Z794 Long term (current) use of insulin: Secondary | ICD-10-CM | POA: Diagnosis not present

## 2023-02-06 DIAGNOSIS — E1142 Type 2 diabetes mellitus with diabetic polyneuropathy: Secondary | ICD-10-CM

## 2023-02-06 LAB — POCT GLYCOSYLATED HEMOGLOBIN (HGB A1C): Hemoglobin A1C: 7.9 % — AB (ref 4.0–5.6)

## 2023-02-06 MED ORDER — TRULICITY 1.5 MG/0.5ML ~~LOC~~ SOAJ
1.5000 mg | SUBCUTANEOUS | 3 refills | Status: DC
  Filled 2023-02-06: qty 2, 28d supply, fill #0
  Filled 2023-03-08: qty 2, 28d supply, fill #1
  Filled 2023-04-07: qty 2, 28d supply, fill #2

## 2023-02-06 MED ORDER — EMPAGLIFLOZIN 25 MG PO TABS
25.0000 mg | ORAL_TABLET | Freq: Every day | ORAL | 3 refills | Status: DC
  Filled 2023-02-06: qty 30, 30d supply, fill #0
  Filled 2023-03-08: qty 30, 30d supply, fill #1
  Filled 2023-04-07: qty 30, 30d supply, fill #2

## 2023-02-06 MED ORDER — NOVOLIN 70/30 FLEXPEN (70-30) 100 UNIT/ML ~~LOC~~ SUPN
14.0000 [IU] | PEN_INJECTOR | Freq: Two times a day (BID) | SUBCUTANEOUS | 3 refills | Status: DC
  Filled 2023-02-06: qty 6, 21d supply, fill #0
  Filled 2023-02-22: qty 6, 21d supply, fill #1
  Filled 2023-03-30: qty 6, 21d supply, fill #2
  Filled 2023-04-19: qty 6, 21d supply, fill #3
  Filled 2023-05-28: qty 6, 21d supply, fill #4

## 2023-02-06 NOTE — Progress Notes (Signed)
Name: Micheal Neal  Age/ Sex: 52 y.o., male   MRN/ DOB: [id number], [birth year]     PCP: [name], NP   Reason for Endocrinology Evaluation: Type 2 Diabetes Mellitus  Initial Endocrine Consultative Visit: 05/31/2022    PATIENT IDENTIFIER: Mr. Micheal Neal is a 52 y.o. male with a past medical history of CAD (s/p PCI 05/2022) , DM, orthostatic hypotension. The patient has followed with Endocrinology clinic since 05/31/2022 for consultative assistance with management of his diabetes.  DIABETIC HISTORY:  Mr. Micheal Neal was diagnosed with DM 2012, he has been on metformin in the past but developed GI upset , he has been on insulin since his diagnosis . His hemoglobin A1c has ranged from 8.7% in 2019, peaking at 12.5% in 2024.   GAD-65 < 1 in 2012  He was always on basal/prandial insulin but due to cost issues he was switched to insulin mix during hospitalization for MI in 05/2022 Glipizide was started during hospitalization 05/2022  Mother with CHF    On his initial visit to our clinic 05/2022 I started him on Jardiance  His insurance did not cover Ozempic, started Trulicity 09/2022  SUBJECTIVE:     Today (02/06/2023): Mr. Micheal Neal is here for diabetes management.  He  is accompanied by his wife . He checks his blood sugars multiple  times daily. The patient has had hypoglycemic episodes since the last clinic visit.  Patient under the care of cardiology for CAD as well as orthostatic hypotension, he is on midodrine  He follows with urology for hypogonadism He was seen by podiatry 10/09/2022  Denies nausea or vomiting  No recent constipation or diarrhea    HOME DIABETES REGIMEN:  Jardiance 10 mg daily Trulicity 0.75 mg weekly Novolin mix 18 units before breakfast and 10 units before supper- 18 units BID     Statin: Yes ACE-I/ARB: no   CONTINUOUS GLUCOSE MONITORING RECORD INTERPRETATION    Dates of Recording:10/25-11/09/2022  Sensor description:dexcom  Results  statistics:   CGM use % of time 36  Average and SD 177/42  Time in range 61 %  % Time Above 180 32  % Time above 250 7  % Time Below target 0   Glycemic patterns summary: BGs are optimal overnight and increased throughout the day  Hyperglycemic episodes postprandial  Hypoglycemic episodes occurred not in the past 2 weeks  Overnight periods: Variable      DIABETIC COMPLICATIONS: Microvascular complications:  Neuropathy  Denies: CKD Last Eye Exam: scheduled next week  Macrovascular complications:  CAD Denies:  CVA, PVD   HISTORY:  Past Medical History:  Past Medical History:  Diagnosis Date   Abnormal EKG    AKI (acute kidney injury) (HCC) 05/21/2022   Anemia 07/11/2022   CAD (coronary artery disease)    Contusion of left foot    Diabetes mellitus without complication (HCC)    Dizziness    Encounter for hepatitis C screening test for low risk patient 07/11/2022   Gastrointestinal hemorrhage 07/13/2022   GERD (gastroesophageal reflux disease)    History of myocardial infarction within last 3 months 07/13/2022   Near syncope    Non-STEMI (non-ST elevated myocardial infarction) (HCC) 05/19/2022   Palpitations    Syncope and collapse 05/19/2022   Vertigo    Past Surgical History:  Past Surgical History:  Procedure Laterality Date   BIOPSY  07/13/2022   Procedure: BIOPSY;  Surgeon: Jenel Lucks, MD;  Location[facility] ENDOSCOPY;  Service: Gastroenterology;;   BIOPSY  07/15/2022  Procedure: BIOPSY;  Surgeon: Beverley Fiedler, MD;  Location: [facility] ENDOSCOPY;  Service: Gastroenterology;;   COLONOSCOPY N/A 07/15/2022   Procedure: COLONOSCOPY;  Surgeon: Beverley Fiedler, MD;  Location: [facility] ENDOSCOPY;  Service: Gastroenterology;  Laterality: N/A;   CORONARY STENT INTERVENTION N/A 05/20/2022   Procedure: CORONARY STENT INTERVENTION;  Surgeon: Runell Gess, MD;  Location: [facility] INVASIVE CV LAB;  Service: Cardiovascular;  Laterality: N/A;   ESOPHAGOGASTRODUODENOSCOPY N/A  07/13/2022   Procedure: ESOPHAGOGASTRODUODENOSCOPY (EGD);  Surgeon: Jenel Lucks, MD;  Location: [facility] ENDOSCOPY;  Service: Gastroenterology;  Laterality: N/A;   LEFT HEART CATH AND CORONARY ANGIOGRAPHY N/A 05/20/2022   Procedure: LEFT HEART CATH AND CORONARY ANGIOGRAPHY;  Surgeon: Runell Gess, MD;  Location: [facility] INVASIVE CV LAB;  Service: Cardiovascular;  Laterality: N/A;   NO PAST SURGERIES     Social History:  reports that he has never smoked. He has never used smokeless tobacco. He reports that he does not drink alcohol and does not use drugs. Family History:  Family History  Problem Relation Age of Onset   Heart disease Mother    Breast cancer Maternal Grandmother        diagnosed late 64's   Breast cancer Maternal Aunt      HOME MEDICATIONS: Allergies as of 02/06/2023   No Known Allergies      Medication List        Accurate as of February 06, 2023  9:33 AM. If you have any questions, ask your nurse or doctor.          aspirin EC 81 MG tablet Take 1 tablet (81 mg total) by mouth daily. Swallow whole.   atorvastatin 80 MG tablet Commonly known as: LIPITOR Take 1 tablet (80 mg total) by mouth daily.   carvedilol 3.125 MG tablet Commonly known as: COREG Take 1 tablet (3.125 mg total) by mouth 2 (two) times daily with a meal.   clopidogrel 75 MG tablet Commonly known as: PLAVIX Take 4 tablets at same time (300 mg) on day 1, then take 1 tablet by mouth daily thereafter   CYANOCOBALAMIN IJ Inject 1 Dose as directed every 30 (thirty) days.   Dexcom G7 Sensor Misc Use as directed. Change every 10 days.   Jardiance 10 MG Tabs tablet Generic drug: empagliflozin Take 1 tablet (10 mg total) by mouth daily.   midodrine 10 MG tablet Commonly known as: PROAMATINE Take 1 tablet (10 mg total) by mouth 3 (three) times daily.   NovoLIN 70/30 Kwikpen (70-30) 100 UNIT/ML KwikPen Generic drug: insulin isophane & regular human KwikPen Inject 10 units twice a day    omega-3 acid ethyl esters 1 g capsule Commonly known as: LOVAZA Take 2 capsules (2 g total) by mouth 2 (two) times daily.   ondansetron 4 MG disintegrating tablet Commonly known as: ZOFRAN-ODT Take 1 tablet (4 mg total) by mouth every 8 (eight) hours as needed for nausea or vomiting.   pantoprazole 40 MG tablet Commonly known as: PROTONIX Take 1 tablet (40 mg total) by mouth daily.   sildenafil 100 MG tablet Commonly known as: VIAGRA Take either 1 tab po prn or 1/2 tab po prn with 1/2 tab of tadalafil.   tadalafil 20 MG tablet Commonly known as: CIALIS 1 tab po prn or 1/2 tab po with 1/2 tab of sildenafil 100mg .   testosterone cypionate 200 MG/ML injection Commonly known as: DEPOTESTOSTERONE CYPIONATE Inject 0.5 mLs (100 mg total) into the muscle once a week. Discard vial after use.  TRUEplus Pen Needles 31G X 5 MM Misc Generic drug: Insulin Pen Needle USE AS DIRECTED FOUR TIMES DAILY   Trulicity 0.75 MG/0.5ML Soaj Generic drug: Dulaglutide Inject 0.75 mg into the skin once a week.   Vitamin D (Ergocalciferol) 1.25 MG (50000 UNIT) Caps capsule Commonly known as: DRISDOL Take 1 capsule (50,000 Units total) by mouth every 7 (seven) days.         OBJECTIVE:   Vital Signs: BP 122/80 (BP Location: Left Arm, Patient Position: Sitting, Cuff Size: Large)   Pulse 79   Ht 6\' 2"  (1.88 m)   Wt 263 lb (119.3 kg)   SpO2 97%   BMI 33.77 kg/m   Wt Readings from Last 3 Encounters:  02/06/23 263 lb (119.3 kg)  12/12/22 259 lb 6 oz (117.7 kg)  12/06/22 259 lb (117.5 kg)     Exam: General: Pt appears well and is in NAD  Lungs: Clear with good BS bilat   Heart: RRR   Abdomen:  soft, nontender  Extremities: No pretibial edema.   Neuro: MS is good with appropriate affect, pt is alert and Ox3    DM foot exam: 10/09/2022 per podiatry       DATA REVIEWED:  Lab Results  Component Value Date   HGBA1C 7.9 (A) 02/06/2023   HGBA1C 6.4 (A) 08/30/2022   HGBA1C 12.5 (H)  05/19/2022    Latest Reference Range & Units 05/28/22 04:00  Sodium 135 - 145 mmol/L 137  Potassium 3.5 - 5.1 mmol/L 3.9  Chloride 98 - 111 mmol/L 106  CO2 22 - 32 mmol/L 25  Glucose 70 - 99 mg/dL 161 (H)  BUN 6 - 20 mg/dL 17  Creatinine 0.96 - 0.45 mg/dL 4.09  Calcium 8.9 - 81.1 mg/dL 8.3 (L)  Anion gap 5 - 15  6  Alkaline Phosphatase 38 - 126 U/L 64  Albumin 3.5 - 5.0 g/dL 3.2 (L)  AST 15 - 41 U/L 26  ALT 0 - 44 U/L 27  Total Protein 6.5 - 8.1 g/dL 6.9  Total Bilirubin 0.3 - 1.2 mg/dL 0.9  GFR, Estimated >91 mL/min >60   ASSESSMENT / PLAN / RECOMMENDATIONS:   1) Type 2 Diabetes Mellitus, optimally controlled, With Neuropathic and  macrovascular complications - Most recent A1c of 7.9 %. Goal A1c < 7.0 %.     -A1c has increased from 6.4% to 7.9%, this is attributed to dietary indiscretions -Intolerant to metformin -He is tolerating Jardiance, will increase -He is tolerating Trulicity, will increase.  Patient did have better outcome with Ozempic but it was not on the formulary -Will decrease insulin preemptively to prevent hypoglycemia  MEDICATIONS: Increase Jardiance 25 mg daily Increase Trulicity 1.5 mg weekly Change Novolin mix to 14 units before breakfast and 14 units before supper  EDUCATION / INSTRUCTIONS: BG monitoring instructions: Patient is instructed to check his blood sugars 3 times a day. Call [facility] if: BG persistently < 70  I reviewed the Rule of 15 for the treatment of hypoglycemia in detail with the patient. Literature supplied.    2) Diabetic complications:  Eye: Does not have known diabetic retinopathy.  Neuro/ Feet: Does  have known diabetic peripheral neuropathy .  Renal: Patient does not have known baseline CKD. He   is not on an ACEI/ARB at present.      F/U in 4 months     Signed electronically by: Lyndle Herrlich, MD  [facility]  [facility] [address]  [address] [place], [place] [address] Phone: [phone] FAX: [phone]   CC: [name], NP [address] [place] [address] Phone: [phone]  Fax: [phone]  Return to Endocrinology clinic as below: Future Appointments  Date Time Provider Department Center  02/07/2023 10:30 AM [facility] None  02/13/2023  3:15 PM Bjorn Pippin, MD [facility] None  03/13/2023 10:00 AM [name], NP [facility] None

## 2023-02-06 NOTE — Patient Instructions (Addendum)
Increase Jardiance 25 mg daily  Increase Trulicity 1.5  mg weekly  Change  Novolog Mix to 14 units before Breakfast and 14 units Before Supper     HOW TO TREAT LOW BLOOD SUGARS (Blood sugar LESS THAN 70 MG/DL) Please follow the RULE OF 15 for the treatment of hypoglycemia treatment (when your (blood sugars are less than 70 mg/dL)   STEP 1: Take 15 grams of carbohydrates when your blood sugar is low, which includes:  3-4 GLUCOSE TABS  OR 3-4 OZ OF JUICE OR REGULAR SODA OR ONE TUBE OF GLUCOSE GEL    STEP 2: RECHECK blood sugar in 15 MINUTES STEP 3: If your blood sugar is still low at the 15 minute recheck --> then, go back to STEP 1 and treat AGAIN with another 15 grams of carbohydrates.

## 2023-02-07 ENCOUNTER — Other Ambulatory Visit: Payer: 59

## 2023-02-12 ENCOUNTER — Other Ambulatory Visit (HOSPITAL_COMMUNITY): Payer: Self-pay

## 2023-02-13 ENCOUNTER — Ambulatory Visit: Payer: 59 | Admitting: Urology

## 2023-02-18 ENCOUNTER — Other Ambulatory Visit (HOSPITAL_COMMUNITY): Payer: Self-pay

## 2023-02-22 ENCOUNTER — Other Ambulatory Visit (HOSPITAL_COMMUNITY): Payer: Self-pay

## 2023-02-22 ENCOUNTER — Other Ambulatory Visit: Payer: Self-pay | Admitting: Nurse Practitioner

## 2023-02-22 DIAGNOSIS — I2510 Atherosclerotic heart disease of native coronary artery without angina pectoris: Secondary | ICD-10-CM

## 2023-02-24 ENCOUNTER — Other Ambulatory Visit: Payer: Self-pay

## 2023-02-24 MED ORDER — CARVEDILOL 3.125 MG PO TABS
3.1250 mg | ORAL_TABLET | Freq: Two times a day (BID) | ORAL | 3 refills | Status: DC
  Filled 2023-02-24: qty 60, 30d supply, fill #0
  Filled 2023-03-30: qty 60, 30d supply, fill #1
  Filled 2023-04-27 – 2023-05-02 (×3): qty 60, 30d supply, fill #2
  Filled 2023-05-28: qty 60, 30d supply, fill #3

## 2023-02-25 ENCOUNTER — Other Ambulatory Visit (HOSPITAL_COMMUNITY): Payer: Self-pay

## 2023-02-25 ENCOUNTER — Other Ambulatory Visit: Payer: Self-pay

## 2023-02-25 ENCOUNTER — Encounter: Payer: Self-pay | Admitting: Hematology

## 2023-02-28 ENCOUNTER — Other Ambulatory Visit: Payer: Self-pay

## 2023-03-08 ENCOUNTER — Other Ambulatory Visit: Payer: Self-pay | Admitting: Internal Medicine

## 2023-03-08 DIAGNOSIS — E781 Pure hyperglyceridemia: Secondary | ICD-10-CM

## 2023-03-10 ENCOUNTER — Other Ambulatory Visit: Payer: Self-pay

## 2023-03-13 ENCOUNTER — Ambulatory Visit (INDEPENDENT_AMBULATORY_CARE_PROVIDER_SITE_OTHER): Payer: 59 | Admitting: Nurse Practitioner

## 2023-03-13 ENCOUNTER — Encounter: Payer: Self-pay | Admitting: Nurse Practitioner

## 2023-03-13 ENCOUNTER — Other Ambulatory Visit: Payer: Self-pay

## 2023-03-13 VITALS — BP 122/82 | HR 61 | Temp 97.8°F | Ht 74.0 in | Wt 261.2 lb

## 2023-03-13 DIAGNOSIS — E1143 Type 2 diabetes mellitus with diabetic autonomic (poly)neuropathy: Secondary | ICD-10-CM | POA: Diagnosis not present

## 2023-03-13 DIAGNOSIS — E785 Hyperlipidemia, unspecified: Secondary | ICD-10-CM

## 2023-03-13 DIAGNOSIS — Z23 Encounter for immunization: Secondary | ICD-10-CM

## 2023-03-13 DIAGNOSIS — R002 Palpitations: Secondary | ICD-10-CM

## 2023-03-13 DIAGNOSIS — Z0000 Encounter for general adult medical examination without abnormal findings: Secondary | ICD-10-CM

## 2023-03-13 DIAGNOSIS — E669 Obesity, unspecified: Secondary | ICD-10-CM

## 2023-03-13 DIAGNOSIS — Z0001 Encounter for general adult medical examination with abnormal findings: Secondary | ICD-10-CM | POA: Insufficient documentation

## 2023-03-13 LAB — MICROALBUMIN / CREATININE URINE RATIO
Creatinine,U: 131.8 mg/dL
Microalb Creat Ratio: 1 mg/g (ref 0.0–30.0)
Microalb, Ur: 1.4 mg/dL (ref 0.0–1.9)

## 2023-03-13 LAB — CBC
HCT: 44.6 % (ref 39.0–52.0)
Hemoglobin: 14.8 g/dL (ref 13.0–17.0)
MCHC: 33.3 g/dL (ref 30.0–36.0)
MCV: 85.1 fL (ref 78.0–100.0)
Platelets: 229 10*3/uL (ref 150.0–400.0)
RBC: 5.24 Mil/uL (ref 4.22–5.81)
RDW: 13.6 % (ref 11.5–15.5)
WBC: 7 10*3/uL (ref 4.0–10.5)

## 2023-03-13 LAB — COMPREHENSIVE METABOLIC PANEL
ALT: 22 U/L (ref 0–53)
AST: 21 U/L (ref 0–37)
Albumin: 4.7 g/dL (ref 3.5–5.2)
Alkaline Phosphatase: 88 U/L (ref 39–117)
BUN: 21 mg/dL (ref 6–23)
CO2: 29 meq/L (ref 19–32)
Calcium: 9.6 mg/dL (ref 8.4–10.5)
Chloride: 101 meq/L (ref 96–112)
Creatinine, Ser: 1.19 mg/dL (ref 0.40–1.50)
GFR: 71.17 mL/min (ref >60.00)
Glucose, Bld: 151 mg/dL — ABNORMAL HIGH (ref 70–99)
Potassium: 4.5 meq/L (ref 3.5–5.1)
Sodium: 139 meq/L (ref 135–145)
Total Bilirubin: 0.9 mg/dL (ref 0.2–1.2)
Total Protein: 8.1 g/dL (ref 6.0–8.3)

## 2023-03-13 LAB — LIPID PANEL
Cholesterol: 145 mg/dL (ref 0–200)
HDL: 41.2 mg/dL (ref >39.00)
LDL Cholesterol: 80 mg/dL (ref 0–99)
NonHDL: 104.1
Total CHOL/HDL Ratio: 4
Triglycerides: 123 mg/dL (ref 0.0–149.0)
VLDL: 24.6 mg/dL (ref 0.0–40.0)

## 2023-03-13 LAB — TSH: TSH: 2.22 u[IU]/mL (ref 0.35–5.50)

## 2023-03-13 NOTE — Assessment & Plan Note (Signed)
Chronic Patient encouraged to follow diabetic and heart healthy diet. Labs ordered, further recommendations may be made based upon these results

## 2023-03-13 NOTE — Assessment & Plan Note (Signed)
Chronic Check fasting lipid panel today, further recommendations may be made based upon these results

## 2023-03-13 NOTE — Assessment & Plan Note (Signed)
Chronic Last A1c slightly above goal Continue current management strategy and follow-up with endocrinology as scheduled.  Will order urine testing for albuminuria, patient is not sure if he will be able to urinate today.  Consider doing this at next follow-up if unable to urinate today.

## 2023-03-13 NOTE — Assessment & Plan Note (Signed)
For shingles vaccine administered, VIS provided.  Patient educated to get second vaccine in 2 to 6 months.  Patient will follow-up in 6 months.

## 2023-03-13 NOTE — Progress Notes (Signed)
Established Patient Office Visit  Subjective   Patient ID: Micheal Neal, male    DOB: Oct 17, 1970  Age: 52 y.o. MRN: [id number]  Chief Complaint  Patient presents with   Medical Management of Chronic Issues    3 month follow up    Patient has today for follow-up as well as for annual physical.  Health maintenance: Up-to-date on diabetic eye exam, diabetic foot exam, tetanus vaccine, colon cancer screening, has completed hep C screening and STI screening.  Is eligible for shingles vaccine.  Would like to have this administered.  Denies any personal or family history of prostate cancer.  Type 2 diabetes with associated autonomic dysfunction, hyperlipidemia, and obesity: Follows with endocrinology.  Last A1c above goal at 7.9.  Patient does admit to straining from his diabetic diet which he attributes to the cause and increased blood sugars.  Reports he is made significant changes since last A1c was drawn.  For repeat lipid panel, continues on atorvastatin 80 mg daily Plavix 75 mg daily.  For diabetes control he continues on Trulicity 1.5 mg weekly, insulin 70/30 14 units twice daily, and Jardiance 25 mg daily.  Heart palpitations/CAD: History of NSTEMI.  Reports over the last 2 months he has been having intermittent palpitations.  No obvious triggers, notices it when he is at rest, not triggered by physical exertion, they occur once-twice a month, last for a few seconds and then spontaneously resolved.  No pain or shortness of breath identified as being associated symptoms.  Denies caffeine intake.  Has undergone cardiac monitoring in the past which identified runs of SVT.  Patient does not wish to have additional evaluation completed today.      Review of Systems  Constitutional:  Negative for weight loss.  Respiratory:  Negative for shortness of breath.   Cardiovascular:  Negative for chest pain and palpitations.      Objective:     BP 122/82 (Cuff Size: Large)   Pulse 61   Temp  97.8 F (36.6 C) (Temporal)   Ht 6\' 2"  (1.88 m)   Wt 261 lb 4 oz (118.5 kg)   SpO2 95%   BMI 33.54 kg/m  BP Readings from Last 3 Encounters:  03/13/23 122/82  02/06/23 122/80  12/25/22 113/79   Wt Readings from Last 3 Encounters:  03/13/23 261 lb 4 oz (118.5 kg)  02/06/23 263 lb (119.3 kg)  12/12/22 259 lb 6 oz (117.7 kg)      Physical Exam Vitals reviewed.  Constitutional:      General: He is not in acute distress.    Appearance: Normal appearance. He is not ill-appearing.  HENT:     Head: Normocephalic and atraumatic.     Right Ear: Tympanic membrane, ear canal and external ear normal.     Left Ear: Tympanic membrane, ear canal and external ear normal.  Eyes:     General: No scleral icterus.    Extraocular Movements: Extraocular movements intact.     Conjunctiva/sclera: Conjunctivae normal.     Pupils: Pupils are equal, round, and reactive to light.  Neck:     Vascular: No carotid bruit.  Cardiovascular:     Rate and Rhythm: Normal rate and regular rhythm.     Pulses: Normal pulses.     Heart sounds: Normal heart sounds.  Pulmonary:     Effort: Pulmonary effort is normal.     Breath sounds: Normal breath sounds.  Abdominal:     General: Bowel sounds are normal. There is  no distension.     Palpations: There is no mass.     Tenderness: There is no abdominal tenderness.     Hernia: No hernia is present.  Musculoskeletal:        General: No swelling or tenderness.     Cervical back: Normal range of motion and neck supple. No rigidity.  Lymphadenopathy:     Cervical: No cervical adenopathy.  Skin:    General: Skin is warm and dry.  Neurological:     General: No focal deficit present.     Mental Status: He is alert and oriented to person, place, and time.     Cranial Nerves: No cranial nerve deficit.     Sensory: No sensory deficit.     Motor: No weakness.     Gait: Gait normal.  Psychiatric:        Mood and Affect: Mood normal.        Behavior: Behavior  normal.        Judgment: Judgment normal.      No results found for any visits on 03/13/23.    The ASCVD Risk score (Arnett DK, et al., 2019) failed to calculate for the following reasons:   Risk score cannot be calculated because patient has a medical history suggesting prior/existing ASCVD    Assessment & Plan:   Problem List Items Addressed This Visit       Endocrine   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)   Chronic Last A1c slightly above goal Continue current management strategy and follow-up with endocrinology as scheduled.  Will order urine testing for albuminuria, patient is not sure if he will be able to urinate today.  Consider doing this at next follow-up if unable to urinate today.      Relevant Orders   CBC   Comprehensive metabolic panel   TSH   Lipid panel   Microalbumin / creatinine urine ratio     Other   Obesity (BMI 30-39.9)   Chronic Patient encouraged to follow diabetic and heart healthy diet. Labs ordered, further recommendations may be made based upon these results       Relevant Orders   CBC   Comprehensive metabolic panel   TSH   Lipid panel   Microalbumin / creatinine urine ratio   Hyperlipidemia with target LDL less than 100   Chronic Check fasting lipid panel today, further recommendations may be made based upon these results.      Relevant Orders   CBC   Comprehensive metabolic panel   TSH   Lipid panel   Microalbumin / creatinine urine ratio   Heart palpitations   Intermittent, no red flags identified today upon history.  Clinical exam suggest patient being in sinus rhythm today.  Per shared decision making we will hold off on repeating cardiac monitor but patient was educated that if palpitations occur more frequently, last longer, or associate with pain/shortness of breath he should reach out at which point further evaluation should be completed.      Relevant Orders   CBC   Comprehensive metabolic  panel   TSH   Lipid panel   Microalbumin / creatinine urine ratio   Need for vaccination   For shingles vaccine administered, VIS provided.  Patient educated to get second vaccine in 2 to 6 months.  Patient will follow-up in 6 months.      Relevant Orders   CBC   Comprehensive metabolic panel   TSH   Lipid panel  Microalbumin / creatinine urine ratio   Zoster Recombinant (Shingrix ) (Completed)   Encounter for general adult medical examination with abnormal findings - Primary   Discussed health maintenance, shingles vaccine will be administered and VIS will be provided. Patient to follow back up in 2 to 6 months for second shingles vaccine. Handout provided.      Relevant Orders   CBC   Comprehensive metabolic panel   TSH   Lipid panel   Microalbumin / creatinine urine ratio    Return in about 6 months (around 09/11/2023) for F/U with Rylan Kaufmann.  In addition to performing his annual physical exam also performed an office visit as detailed above.   [name], NP

## 2023-03-13 NOTE — Assessment & Plan Note (Signed)
Discussed health maintenance, shingles vaccine will be administered and VIS will be provided. Patient to follow back up in 2 to 6 months for second shingles vaccine. Handout provided.

## 2023-03-13 NOTE — Assessment & Plan Note (Signed)
Intermittent, no red flags identified today upon history.  Clinical exam suggest patient being in sinus rhythm today.  Per shared decision making we will hold off on repeating cardiac monitor but patient was educated that if palpitations occur more frequently, last longer, or associate with pain/shortness of breath he should reach out at which point further evaluation should be completed.

## 2023-03-30 ENCOUNTER — Other Ambulatory Visit (HOSPITAL_COMMUNITY): Payer: Self-pay

## 2023-04-07 ENCOUNTER — Other Ambulatory Visit: Payer: Self-pay

## 2023-04-07 ENCOUNTER — Encounter: Payer: Self-pay | Admitting: Hematology

## 2023-04-16 ENCOUNTER — Other Ambulatory Visit: Payer: Self-pay | Admitting: Internal Medicine

## 2023-04-17 ENCOUNTER — Other Ambulatory Visit: Payer: Self-pay

## 2023-04-17 MED ORDER — ASPIRIN 81 MG PO TBEC
81.0000 mg | DELAYED_RELEASE_TABLET | Freq: Every day | ORAL | 1 refills | Status: DC
  Filled 2023-04-17: qty 30, 30d supply, fill #0
  Filled 2023-05-15: qty 30, 30d supply, fill #1
  Filled 2023-06-13 – 2023-06-19 (×2): qty 30, 30d supply, fill #2
  Filled 2023-07-15 – 2023-07-23 (×3): qty 30, 30d supply, fill #3
  Filled 2023-08-07 – 2023-08-19 (×2): qty 30, 30d supply, fill #4
  Filled 2023-09-27: qty 30, 30d supply, fill #5

## 2023-04-19 ENCOUNTER — Other Ambulatory Visit (HOSPITAL_COMMUNITY): Payer: Self-pay

## 2023-04-21 ENCOUNTER — Ambulatory Visit (INDEPENDENT_AMBULATORY_CARE_PROVIDER_SITE_OTHER): Payer: 59 | Admitting: Family Medicine

## 2023-04-21 ENCOUNTER — Other Ambulatory Visit (HOSPITAL_COMMUNITY): Payer: Self-pay

## 2023-04-21 ENCOUNTER — Encounter: Payer: Self-pay | Admitting: Family Medicine

## 2023-04-21 ENCOUNTER — Other Ambulatory Visit: Payer: Self-pay

## 2023-04-21 VITALS — BP 92/62 | HR 95 | Temp 98.4°F | Ht 74.0 in | Wt 262.6 lb

## 2023-04-21 DIAGNOSIS — E1143 Type 2 diabetes mellitus with diabetic autonomic (poly)neuropathy: Secondary | ICD-10-CM | POA: Diagnosis not present

## 2023-04-21 DIAGNOSIS — I2510 Atherosclerotic heart disease of native coronary artery without angina pectoris: Secondary | ICD-10-CM

## 2023-04-21 DIAGNOSIS — Z7985 Long-term (current) use of injectable non-insulin antidiabetic drugs: Secondary | ICD-10-CM | POA: Diagnosis not present

## 2023-04-21 DIAGNOSIS — I951 Orthostatic hypotension: Secondary | ICD-10-CM

## 2023-04-21 DIAGNOSIS — R42 Dizziness and giddiness: Secondary | ICD-10-CM

## 2023-04-21 LAB — CBC WITH DIFFERENTIAL/PLATELET
Basophils Absolute: 0 10*3/uL (ref 0.0–0.1)
Basophils Relative: 0.4 % (ref 0.0–3.0)
Eosinophils Absolute: 0.1 10*3/uL (ref 0.0–0.7)
Eosinophils Relative: 1.6 % (ref 0.0–5.0)
HCT: 45.1 % (ref 39.0–52.0)
Hemoglobin: 14.7 g/dL (ref 13.0–17.0)
Lymphocytes Relative: 27 % (ref 12.0–46.0)
Lymphs Abs: 1.8 10*3/uL (ref 0.7–4.0)
MCHC: 32.7 g/dL (ref 30.0–36.0)
MCV: 86.4 fL (ref 78.0–100.0)
Monocytes Absolute: 0.3 10*3/uL (ref 0.1–1.0)
Monocytes Relative: 4.7 % (ref 3.0–12.0)
Neutro Abs: 4.4 10*3/uL (ref 1.4–7.7)
Neutrophils Relative %: 66.3 % (ref 43.0–77.0)
Platelets: 248 10*3/uL (ref 150.0–400.0)
RBC: 5.22 Mil/uL (ref 4.22–5.81)
RDW: 14.1 % (ref 11.5–15.5)
WBC: 6.6 10*3/uL (ref 4.0–10.5)

## 2023-04-21 LAB — COMPREHENSIVE METABOLIC PANEL
ALT: 31 U/L (ref 0–53)
AST: 24 U/L (ref 0–37)
Albumin: 4.8 g/dL (ref 3.5–5.2)
Alkaline Phosphatase: 92 U/L (ref 39–117)
BUN: 20 mg/dL (ref 6–23)
CO2: 28 meq/L (ref 19–32)
Calcium: 9.7 mg/dL (ref 8.4–10.5)
Chloride: 101 meq/L (ref 96–112)
Creatinine, Ser: 1.24 mg/dL (ref 0.40–1.50)
GFR: 67.69 mL/min (ref >60.00)
Glucose, Bld: 131 mg/dL — ABNORMAL HIGH (ref 70–99)
Potassium: 4.3 meq/L (ref 3.5–5.1)
Sodium: 137 meq/L (ref 135–145)
Total Bilirubin: 1.3 mg/dL — ABNORMAL HIGH (ref 0.2–1.2)
Total Protein: 8.4 g/dL — ABNORMAL HIGH (ref 6.0–8.3)

## 2023-04-21 MED ORDER — TRULICITY 3 MG/0.5ML ~~LOC~~ SOAJ
3.0000 mg | SUBCUTANEOUS | 3 refills | Status: DC
  Filled 2023-04-21: qty 2, 28d supply, fill #0
  Filled 2023-05-28 – 2023-06-13 (×3): qty 2, 28d supply, fill #1

## 2023-04-21 NOTE — Patient Instructions (Addendum)
STOP Jardiance  START Trulicity 3mg  once weekly  Wear compression socks daily along with abdominal binder.  Follow up with [place] in a month for re evaluation.

## 2023-04-21 NOTE — Progress Notes (Signed)
Acute Office Visit  Subjective:     Patient ID: Micheal Neal, male    DOB: [birth year], 53 y.o.   MRN: [id number]  Chief Complaint  Patient presents with   Dizziness    Past history of orthostatic blood pressure. Notes episodes have gotten worse within these last couple of weeks. Believes that jardiance is making him dehydrated. Has started taking hydration packets recently to try and compensate for being dehydrated. Happens when first standing up and taking steps. Notes they lose hearing as well    HPI Dizziness  He reports recurrent dizziness. He describes it as feeling light headed, occurs every day, and typically lasts a few minutes, usually when changing positions. Worse in the mornings.  It typically occurs when he is standing up from siting position. It is usually relieved by lying down, sitting still, and drinking water with electrolytes . He has not started new medications around the time the dizziness started.   Reports compliance with midodrine 10mg  TID States that he believes the jardiance could be causing this, states that he is dehydrated with this medication.   ROS Per HPI      Objective:    BP 92/62   Pulse 95   Temp 98.4 F (36.9 C)   Ht 6\' 2"  (1.88 m)   Wt 262 lb 9.6 oz (119.1 kg)   SpO2 99%   BMI 33.72 kg/m    Physical Exam Vitals and nursing note reviewed.  Constitutional:      General: He is not in acute distress.    Appearance: Normal appearance.  HENT:     Head: Normocephalic and atraumatic.  Eyes:     Extraocular Movements: Extraocular movements intact.  Neck:     Vascular: No carotid bruit.  Cardiovascular:     Rate and Rhythm: Normal rate and regular rhythm.     Pulses: Normal pulses.     Heart sounds: Normal heart sounds. No murmur heard.    No gallop.  Pulmonary:     Effort: Pulmonary effort is normal. No respiratory distress.     Breath sounds: Normal breath sounds. No wheezing, rhonchi or rales.  Musculoskeletal:         General: Normal range of motion.     Cervical back: Normal range of motion.     Right lower leg: No edema.     Left lower leg: No edema.  Lymphadenopathy:     Cervical: No cervical adenopathy.  Skin:    General: Skin is warm and dry.  Neurological:     General: No focal deficit present.     Mental Status: He is alert and oriented to person, place, and time.  Psychiatric:        Mood and Affect: Mood normal.        Behavior: Behavior normal.    No results found for any visits on 04/21/23. EKG: normal EKG, normal sinus rhythm, unchanged from previous tracings. No acute ischemia.     Assessment & Plan:  1. Dizziness (Primary)  - CBC with Differential/Platelet - Comprehensive metabolic panel - EKG 12-Lead  2. 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  Dispense: 2 mL; Refill: 3  3. Orthostatic hypotension  - Comprehensive metabolic panel - Compression socks - Abdominal binder - Fluids with electrolytes  4. Coronary artery disease involving native coronary artery of native heart, unspecified whether angina present  - Comprehensive  metabolic panel - EKG 12-Lead - Dulaglutide (TRULICITY) 3 MG/0.5ML SOAJ; Inject 3 mg into the skin once a week.  Dispense: 2 mL; Refill: 3   Meds ordered this encounter  Medications   Dulaglutide (TRULICITY) 3 MG/0.5ML SOAJ    Sig: Inject 3 mg into the skin once a week.    Dispense:  2 mL    Refill:  3    Return in about 4 weeks (around 05/19/2023) for recheck dizziness.  Moshe Cipro, FNP

## 2023-04-27 ENCOUNTER — Other Ambulatory Visit (HOSPITAL_COMMUNITY): Payer: Self-pay

## 2023-04-28 ENCOUNTER — Other Ambulatory Visit: Payer: Self-pay

## 2023-04-28 ENCOUNTER — Other Ambulatory Visit (HOSPITAL_COMMUNITY): Payer: Self-pay

## 2023-04-28 ENCOUNTER — Encounter: Payer: Self-pay | Admitting: Gastroenterology

## 2023-04-29 ENCOUNTER — Other Ambulatory Visit: Payer: Self-pay

## 2023-05-01 ENCOUNTER — Other Ambulatory Visit: Payer: Self-pay

## 2023-05-02 ENCOUNTER — Other Ambulatory Visit: Payer: Self-pay

## 2023-05-02 ENCOUNTER — Other Ambulatory Visit (HOSPITAL_COMMUNITY): Payer: Self-pay

## 2023-05-03 ENCOUNTER — Other Ambulatory Visit (HOSPITAL_COMMUNITY): Payer: Self-pay

## 2023-05-05 ENCOUNTER — Other Ambulatory Visit (HOSPITAL_COMMUNITY): Payer: Self-pay

## 2023-05-05 ENCOUNTER — Encounter: Payer: Self-pay | Admitting: Hematology

## 2023-05-06 ENCOUNTER — Encounter: Payer: Self-pay | Admitting: Urology

## 2023-05-15 ENCOUNTER — Encounter: Payer: Self-pay | Admitting: Hematology

## 2023-05-15 ENCOUNTER — Other Ambulatory Visit (HOSPITAL_COMMUNITY): Payer: Self-pay

## 2023-05-22 ENCOUNTER — Other Ambulatory Visit: Payer: 59

## 2023-05-27 ENCOUNTER — Encounter: Payer: Self-pay | Admitting: Hematology

## 2023-05-28 ENCOUNTER — Other Ambulatory Visit (HOSPITAL_COMMUNITY): Payer: Self-pay

## 2023-05-29 ENCOUNTER — Other Ambulatory Visit: Payer: Self-pay

## 2023-05-29 ENCOUNTER — Other Ambulatory Visit (HOSPITAL_COMMUNITY): Payer: Self-pay

## 2023-05-29 ENCOUNTER — Ambulatory Visit: Payer: 59 | Admitting: Urology

## 2023-05-29 ENCOUNTER — Encounter: Payer: Self-pay | Admitting: Pharmacist

## 2023-05-30 ENCOUNTER — Other Ambulatory Visit (HOSPITAL_COMMUNITY): Payer: Self-pay

## 2023-06-03 ENCOUNTER — Ambulatory Visit (INDEPENDENT_AMBULATORY_CARE_PROVIDER_SITE_OTHER): Payer: 59 | Admitting: Internal Medicine

## 2023-06-03 ENCOUNTER — Other Ambulatory Visit (HOSPITAL_COMMUNITY): Payer: Self-pay

## 2023-06-03 ENCOUNTER — Encounter: Payer: Self-pay | Admitting: Hematology

## 2023-06-03 ENCOUNTER — Encounter: Payer: Self-pay | Admitting: Internal Medicine

## 2023-06-03 VITALS — BP 124/72 | HR 93 | Ht 74.0 in | Wt 267.0 lb

## 2023-06-03 DIAGNOSIS — Z794 Long term (current) use of insulin: Secondary | ICD-10-CM | POA: Diagnosis not present

## 2023-06-03 DIAGNOSIS — E1142 Type 2 diabetes mellitus with diabetic polyneuropathy: Secondary | ICD-10-CM | POA: Diagnosis not present

## 2023-06-03 LAB — POCT GLYCOSYLATED HEMOGLOBIN (HGB A1C): Hemoglobin A1C: 6.9 % — AB (ref 4.0–5.6)

## 2023-06-03 MED ORDER — NOVOLIN 70/30 FLEXPEN (70-30) 100 UNIT/ML ~~LOC~~ SUPN
16.0000 [IU] | PEN_INJECTOR | Freq: Two times a day (BID) | SUBCUTANEOUS | 3 refills | Status: AC
  Filled 2023-06-03: qty 30, 94d supply, fill #0
  Filled 2023-06-13: qty 9, 28d supply, fill #0
  Filled 2023-07-23 (×2): qty 9, 28d supply, fill #1
  Filled 2023-08-19: qty 9, 28d supply, fill #2
  Filled 2023-10-15: qty 9, 28d supply, fill #3
  Filled 2023-11-26: qty 9, 28d supply, fill #4
  Filled 2024-01-07: qty 9, 28d supply, fill #5
  Filled 2024-01-30 – 2024-02-17 (×2): qty 9, 28d supply, fill #6
  Filled 2024-03-12 – 2024-03-27 (×2): qty 9, 28d supply, fill #7

## 2023-06-03 NOTE — Progress Notes (Signed)
 Name: Micheal Neal  Age/ Sex: 53 y.o., male   MRN/ DOB: [id number], [birth year]     PCP: [name], NP   Reason for Endocrinology Evaluation: Type 2 Diabetes Mellitus  Initial Endocrine Consultative Visit: 05/31/2022    PATIENT IDENTIFIER: Micheal Neal is a 53 y.o. male with a past medical history of CAD (s/p PCI 05/2022) , DM, orthostatic hypotension. The patient has followed with Endocrinology clinic since 05/31/2022 for consultative assistance with management of his diabetes.  DIABETIC HISTORY:  Micheal Neal was diagnosed with DM 2012, he has been on metformin in the past but developed GI upset , he has been on insulin since his diagnosis . His hemoglobin A1c has ranged from 8.7% in 2019, peaking at 12.5% in 2024.   GAD-65 < 1 in 2012  He was always on basal/prandial insulin but due to cost issues he was switched to insulin mix during hospitalization for MI in 05/2022 Glipizide was started during hospitalization 05/2022  Mother with CHF    On his initial visit to our clinic 05/2022 I started him on Jardiance  His insurance did not cover Ozempic, started Trulicity 09/2022, but had to discontinue in 2025 due to a high deductible and a cost of $800 for the Trulicity   Jardiance was taken off by PCP's office  due to dizziness but this didn't  improve his dizziness 2024  SUBJECTIVE:     Today (06/03/2023): Micheal Neal is here for diabetes management.  He  is accompanied by his wife . He checks his blood sugars multiple  times daily. The patient has had hypoglycemic episodes since the last clinic visit.  Patient under the care of cardiology for CAD as well as orthostatic hypotension, he is on midodrine  He follows with urology for hypogonadism He was seen by podiatry 10/09/2022 He will not be able to take Trulicity due to high deductible   Denies nausea or vomiting  Has occasional constipation but no diarrhea     HOME DIABETES REGIMEN:  Trulicity 3 mg weekly Novolin mix 14  units BID    Statin: Yes ACE-I/ARB: no   CONTINUOUS GLUCOSE MONITORING RECORD INTERPRETATION    Dates of Recording:2/19-06/03/2023  Sensor description:dexcom  Results statistics:   CGM use % of time 88  Average and SD 184/41  Time in range 53 %  % Time Above 180 39  % Time above 250 8  % Time Below target 0   Glycemic patterns summary: BG's trend down overnight and fluctuate during the day  Hyperglycemic episodes postprandial  Hypoglycemic episodes occurred N/A  Overnight periods: Optimal     DIABETIC COMPLICATIONS: Microvascular complications:  Neuropathy  Denies: CKD Last Eye Exam: scheduled next week  Macrovascular complications:  CAD Denies:  CVA, PVD   HISTORY:  Past Medical History:  Past Medical History:  Diagnosis Date   Abnormal EKG    AKI (acute kidney injury) (HCC) 05/21/2022   Anemia 07/11/2022   CAD (coronary artery disease)    Contusion of left foot    Diabetes mellitus without complication (HCC)    Dizziness    Encounter for hepatitis C screening test for low risk patient 07/11/2022   Gastrointestinal hemorrhage 07/13/2022   GERD (gastroesophageal reflux disease)    History of myocardial infarction within last 3 months 07/13/2022   Near syncope    Non-STEMI (non-ST elevated myocardial infarction) (HCC) 05/19/2022   Palpitations    Syncope and collapse 05/19/2022   Vertigo    Past Surgical History:  Past Surgical History:  Procedure Laterality Date   BIOPSY  07/13/2022   Procedure: BIOPSY;  Surgeon: Jenel Lucks, MD;  Location: [facility] ENDOSCOPY;  Service: Gastroenterology;;   BIOPSY  07/15/2022   Procedure: BIOPSY;  Surgeon: Beverley Fiedler, MD;  Location: [facility] ENDOSCOPY;  Service: Gastroenterology;;   COLONOSCOPY N/A 07/15/2022   Procedure: COLONOSCOPY;  Surgeon: Beverley Fiedler, MD;  Location: [facility] ENDOSCOPY;  Service: Gastroenterology;  Laterality: N/A;   CORONARY STENT INTERVENTION N/A 05/20/2022   Procedure: CORONARY STENT  INTERVENTION;  Surgeon: Runell Gess, MD;  Location: [facility] INVASIVE CV LAB;  Service: Cardiovascular;  Laterality: N/A;   ESOPHAGOGASTRODUODENOSCOPY N/A 07/13/2022   Procedure: ESOPHAGOGASTRODUODENOSCOPY (EGD);  Surgeon: Jenel Lucks, MD;  Location: [facility] ENDOSCOPY;  Service: Gastroenterology;  Laterality: N/A;   LEFT HEART CATH AND CORONARY ANGIOGRAPHY N/A 05/20/2022   Procedure: LEFT HEART CATH AND CORONARY ANGIOGRAPHY;  Surgeon: Runell Gess, MD;  Location: [facility] INVASIVE CV LAB;  Service: Cardiovascular;  Laterality: N/A;   NO PAST SURGERIES     Social History:  reports that he has never smoked. He has never used smokeless tobacco. He reports that he does not drink alcohol and does not use drugs. Family History:  Family History  Problem Relation Age of Onset   Heart disease Mother    Breast cancer Maternal Grandmother        diagnosed late 22's   Breast cancer Maternal Aunt      HOME MEDICATIONS: Allergies as of 06/03/2023   No Known Allergies      Medication List        Accurate as of June 03, 2023  8:37 AM. If you have any questions, ask your nurse or doctor.          aspirin EC 81 MG tablet Take 1 tablet (81 mg total) by mouth daily. Swallow whole.   atorvastatin 80 MG tablet Commonly known as: LIPITOR Take 1 tablet (80 mg total) by mouth daily.   carvedilol 3.125 MG tablet Commonly known as: COREG Take 1 tablet (3.125 mg total) by mouth 2 (two) times daily with a meal.   clopidogrel 75 MG tablet Commonly known as: PLAVIX Take 4 tablets at same time (300 mg) on day 1, then take 1 tablet by mouth daily thereafter   CYANOCOBALAMIN IJ Inject 1 Dose as directed every 30 (thirty) days.   Dexcom G7 Sensor Misc Use as directed. Change every 10 days.   midodrine 10 MG tablet Commonly known as: PROAMATINE Take 1 tablet (10 mg total) by mouth 3 (three) times daily.   NovoLIN 70/30 Kwikpen (70-30) 100 UNIT/ML KwikPen Generic drug: insulin isophane &  regular human KwikPen Inject 14 Units into the skin 2 (two) times daily   omega-3 acid ethyl esters 1 g capsule Commonly known as: LOVAZA Take 2 capsules (2 g total) by mouth 2 (two) times daily.   pantoprazole 40 MG tablet Commonly known as: PROTONIX Take 1 tablet (40 mg total) by mouth daily.   TRUEplus Pen Needles 31G X 5 MM Misc Generic drug: Insulin Pen Needle USE AS DIRECTED FOUR TIMES DAILY   Trulicity 3 MG/0.5ML Soaj Generic drug: Dulaglutide Inject 3 mg into the skin once a week.         OBJECTIVE:   Vital Signs: BP 124/72 (BP Location: Left Arm, Patient Position: Sitting, Cuff Size: Normal)   Pulse 93   Ht 6\' 2"  (1.88 m)   Wt 267 lb (121.1 kg)   SpO2 98%  BMI 34.28 kg/m   Wt Readings from Last 3 Encounters:  06/03/23 267 lb (121.1 kg)  04/21/23 262 lb 9.6 oz (119.1 kg)  03/13/23 261 lb 4 oz (118.5 kg)     Exam: General: Pt appears well and is in NAD  Lungs: Clear with good BS bilat   Heart: RRR   Abdomen:  soft, nontender  Extremities: No pretibial edema.   Neuro: MS is good with appropriate affect, pt is alert and Ox3    DM foot exam: 10/09/2022 per podiatry       DATA REVIEWED:  Lab Results  Component Value Date   HGBA1C 6.9 (A) 06/03/2023   HGBA1C 7.9 (A) 02/06/2023   HGBA1C 6.4 (A) 08/30/2022     Latest Reference Range & Units 04/21/23 13:53  Sodium 135 - 145 mEq/L 137  Potassium 3.5 - 5.1 mEq/L 4.3  Chloride 96 - 112 mEq/L 101  CO2 19 - 32 mEq/L 28  Glucose 70 - 99 mg/dL 161 (H)  BUN 6 - 23 mg/dL 20  Creatinine 0.96 - 0.45 mg/dL 4.09  Calcium 8.4 - 81.1 mg/dL 9.7  Alkaline Phosphatase 39 - 117 U/L 92  Albumin 3.5 - 5.2 g/dL 4.8  AST 0 - 37 U/L 24  ALT 0 - 53 U/L 31  Total Protein 6.0 - 8.3 g/dL 8.4 (H)  Total Bilirubin 0.2 - 1.2 mg/dL 1.3 (H)  GFR >91.47 mL/min 67.69    Latest Reference Range & Units 03/13/23 10:30  Creatinine,U mg/dL 829.5  Microalb, Ur 0.0 - 1.9 mg/dL 1.4  MICROALB/CREAT RATIO 0.0 - 30.0 mg/g 1.0     Latest Reference Range & Units 03/13/23 10:30  Total CHOL/HDL Ratio  4  Cholesterol 0 - 200 mg/dL 621  HDL Cholesterol >30.86 mg/dL 57.84  LDL (calc) 0 - 99 mg/dL 80  MICROALB/CREAT RATIO 0.0 - 30.0 mg/g 1.0  NonHDL  104.10  Triglycerides 0.0 - 149.0 mg/dL 696.2  VLDL 0.0 - 95.2 mg/dL 84.1     ASSESSMENT / PLAN / RECOMMENDATIONS:   1) Type 2 Diabetes Mellitus, optimally controlled, With Neuropathic and  macrovascular complications - Most recent A1c of 6.9 %. Goal A1c < 7.0 %.    -Intolerant to metformin -Jardiance was discontinued by his PCPs office due to dizziness in 2024, but the patient did not notice any improvement in his dizziness -Unfortunately, he would not be able to continue Trulicity at this time due to a high deductible plan, coupons did not help. -I will increase his insulin as below -Encouraged exercise and low-carb diet    MEDICATIONS:  Change Novolin mix to 16 units before breakfast and 16 units before supper  EDUCATION / INSTRUCTIONS: BG monitoring instructions: Patient is instructed to check his blood sugars 3 times a day. Call [facility] if: BG persistently < 70  I reviewed the Rule of 15 for the treatment of hypoglycemia in detail with the patient. Literature supplied.    2) Diabetic complications:  Eye: Does not have known diabetic retinopathy.  Neuro/ Feet: Does  have known diabetic peripheral neuropathy .  Renal: Patient does not have known baseline CKD. He   is not on an ACEI/ARB at present.      F/U in 6 months     Signed electronically by: Lyndle Herrlich, MD  [facility] Endocrinology  [facility] [address] [place] [place], [place] [address] Phone: [phone] FAX: [phone]   CC: [name], NP [address] [place]  [address] Phone: [phone]  Fax: [phone]  Return to Endocrinology clinic as below: Future Appointments  Date Time Provider Department Center   08/06/2023  2:00 PM [facility] None  08/14/2023  3:30 PM Bjorn Pippin, MD [facility] None  09/11/2023 10:00 AM [name], NP [facility] None

## 2023-06-03 NOTE — Patient Instructions (Signed)
  Change  Novolog Mix to 16 units before Breakfast and 16 units Before Supper     HOW TO TREAT LOW BLOOD SUGARS (Blood sugar LESS THAN 70 MG/DL) Please follow the RULE OF 15 for the treatment of hypoglycemia treatment (when your (blood sugars are less than 70 mg/dL)   STEP 1: Take 15 grams of carbohydrates when your blood sugar is low, which includes:  3-4 GLUCOSE TABS  OR 3-4 OZ OF JUICE OR REGULAR SODA OR ONE TUBE OF GLUCOSE GEL    STEP 2: RECHECK blood sugar in 15 MINUTES STEP 3: If your blood sugar is still low at the 15 minute recheck --> then, go back to STEP 1 and treat AGAIN with another 15 grams of carbohydrates.

## 2023-06-10 ENCOUNTER — Other Ambulatory Visit (HOSPITAL_COMMUNITY): Payer: Self-pay

## 2023-06-13 ENCOUNTER — Other Ambulatory Visit: Payer: Self-pay

## 2023-06-13 ENCOUNTER — Other Ambulatory Visit: Payer: Self-pay | Admitting: Nurse Practitioner

## 2023-06-13 ENCOUNTER — Other Ambulatory Visit: Payer: Self-pay | Admitting: Internal Medicine

## 2023-06-13 ENCOUNTER — Encounter: Payer: Self-pay | Admitting: Hematology

## 2023-06-13 ENCOUNTER — Other Ambulatory Visit (HOSPITAL_COMMUNITY): Payer: Self-pay

## 2023-06-13 DIAGNOSIS — I2510 Atherosclerotic heart disease of native coronary artery without angina pectoris: Secondary | ICD-10-CM

## 2023-06-13 MED ORDER — CARVEDILOL 3.125 MG PO TABS
3.1250 mg | ORAL_TABLET | Freq: Two times a day (BID) | ORAL | 1 refills | Status: DC
Start: 2023-06-13 — End: 2024-02-19
  Filled 2023-06-13: qty 180, 90d supply, fill #0
  Filled 2023-06-29: qty 60, 30d supply, fill #0
  Filled 2023-08-07 – 2023-08-19 (×2): qty 60, 30d supply, fill #1
  Filled 2023-09-27: qty 60, 30d supply, fill #2
  Filled 2023-11-11: qty 60, 30d supply, fill #3
  Filled 2023-12-09 – 2023-12-23 (×2): qty 60, 30d supply, fill #4
  Filled 2024-01-19: qty 60, 30d supply, fill #5

## 2023-06-16 ENCOUNTER — Other Ambulatory Visit: Payer: Self-pay

## 2023-06-16 ENCOUNTER — Other Ambulatory Visit (HOSPITAL_COMMUNITY): Payer: Self-pay

## 2023-06-16 ENCOUNTER — Encounter: Payer: Self-pay | Admitting: Pharmacist

## 2023-06-16 MED ORDER — DEXCOM G7 SENSOR MISC
1.0000 | 3 refills | Status: DC
  Filled 2023-06-16: qty 3, 30d supply, fill #0

## 2023-06-17 ENCOUNTER — Encounter: Payer: Self-pay | Admitting: Pharmacist

## 2023-06-17 ENCOUNTER — Other Ambulatory Visit: Payer: Self-pay

## 2023-06-18 ENCOUNTER — Other Ambulatory Visit: Payer: Self-pay

## 2023-06-19 ENCOUNTER — Other Ambulatory Visit: Payer: Self-pay

## 2023-06-19 ENCOUNTER — Other Ambulatory Visit (HOSPITAL_COMMUNITY): Payer: Self-pay

## 2023-06-30 ENCOUNTER — Other Ambulatory Visit (HOSPITAL_COMMUNITY): Payer: Self-pay

## 2023-07-01 ENCOUNTER — Other Ambulatory Visit (HOSPITAL_COMMUNITY): Payer: Self-pay

## 2023-07-10 ENCOUNTER — Other Ambulatory Visit: Payer: 59

## 2023-07-16 ENCOUNTER — Other Ambulatory Visit: Payer: Self-pay | Admitting: Cardiovascular Disease

## 2023-07-16 ENCOUNTER — Encounter: Payer: Self-pay | Admitting: Hematology

## 2023-07-16 ENCOUNTER — Other Ambulatory Visit: Payer: Self-pay

## 2023-07-16 ENCOUNTER — Other Ambulatory Visit (HOSPITAL_COMMUNITY): Payer: Self-pay

## 2023-07-16 MED ORDER — CLOPIDOGREL BISULFATE 75 MG PO TABS
75.0000 mg | ORAL_TABLET | Freq: Every day | ORAL | 0 refills | Status: DC
  Filled 2023-07-16 – 2023-07-24 (×3): qty 30, 30d supply, fill #0
  Filled 2023-08-19: qty 30, 30d supply, fill #1
  Filled 2023-09-27: qty 30, 30d supply, fill #2

## 2023-07-17 ENCOUNTER — Ambulatory Visit: Payer: 59 | Admitting: Urology

## 2023-07-17 ENCOUNTER — Other Ambulatory Visit: Payer: Self-pay

## 2023-07-22 ENCOUNTER — Other Ambulatory Visit: Payer: Self-pay

## 2023-07-23 ENCOUNTER — Other Ambulatory Visit (HOSPITAL_COMMUNITY): Payer: Self-pay

## 2023-07-24 ENCOUNTER — Other Ambulatory Visit (HOSPITAL_COMMUNITY): Payer: Self-pay

## 2023-07-24 ENCOUNTER — Other Ambulatory Visit: Payer: Self-pay

## 2023-08-06 ENCOUNTER — Other Ambulatory Visit: Payer: 59

## 2023-08-06 DIAGNOSIS — E291 Testicular hypofunction: Secondary | ICD-10-CM | POA: Diagnosis not present

## 2023-08-07 ENCOUNTER — Other Ambulatory Visit (HOSPITAL_COMMUNITY): Payer: Self-pay

## 2023-08-07 ENCOUNTER — Encounter: Payer: Self-pay | Admitting: Hematology

## 2023-08-07 ENCOUNTER — Other Ambulatory Visit: Payer: Self-pay

## 2023-08-07 LAB — HEMOGLOBIN AND HEMATOCRIT, BLOOD
Hematocrit: 41.2 % (ref 37.5–51.0)
Hemoglobin: 13.7 g/dL (ref 13.0–17.7)

## 2023-08-07 LAB — TESTOSTERONE: Testosterone: 286 ng/dL (ref 264–916)

## 2023-08-13 ENCOUNTER — Other Ambulatory Visit (HOSPITAL_COMMUNITY): Payer: Self-pay

## 2023-08-14 ENCOUNTER — Ambulatory Visit (INDEPENDENT_AMBULATORY_CARE_PROVIDER_SITE_OTHER): Payer: 59 | Admitting: Urology

## 2023-08-14 VITALS — BP 96/67 | HR 89

## 2023-08-14 DIAGNOSIS — E291 Testicular hypofunction: Secondary | ICD-10-CM

## 2023-08-14 DIAGNOSIS — N529 Male erectile dysfunction, unspecified: Secondary | ICD-10-CM

## 2023-08-14 MED ORDER — NONFORMULARY OR COMPOUNDED ITEM
11 refills | Status: DC
Start: 2023-08-14 — End: 2023-08-26

## 2023-08-14 NOTE — Progress Notes (Unsigned)
 Subjective: 1. Hypogonadism in male       08/14/23: Micheal Neal returns today in f/u for his history of hypogonadism.  His Hgb is 13.7 and his T is 286 on TRT.   He has ED as well.  He has no response to the oral meds in combination.    8/15/24[name] returns today in f/u.  His testosterone  level was 220 on labs after his last visit and he was sent testosterone  cypionate to use 100mg  IM weekly.  He has not noticed a benefit yet. He is also here to assess his response to sildenafil  for the ED.   He didn't respond to either sildenafil  or tadalafil  alone or in combination.  He continues to have ejaculatory dysfunction.   08/15/22: Micheal Neal is a 53 yo male who presents for evaluation of ED.  He reports progressive issues over the past year.  He is not able to get any tumescence.  He can reach a climax but has no ejaculate.  He has a good libido.  He has had a history of low T and elevated estradiol  and gynecomastia on the right.  He has intermittent pain and firmness on the right.  He has had no prior treatment except cialis  which didn't work.  He has lost 12 lb since February after an MI.  He had a stent placed.   He has neuropathy.  He is voiding well with an IPSS of 5 and nocturia x 3.  He is on Plavix  but no NTG.  ROS:  Review of Systems  Neurological:  Positive for dizziness.  All other systems reviewed and are negative.   No Known Allergies  Past Medical History:  Diagnosis Date   Abnormal EKG    AKI (acute kidney injury) (HCC) 05/21/2022   Anemia 07/11/2022   CAD (coronary artery disease)    Contusion of left foot    Diabetes mellitus without complication (HCC)    Dizziness    Encounter for hepatitis C screening test for low risk patient 07/11/2022   Gastrointestinal hemorrhage 07/13/2022   GERD (gastroesophageal reflux disease)    History of myocardial infarction within last 3 months 07/13/2022   Near syncope    Non-STEMI (non-ST elevated myocardial infarction) (HCC) 05/19/2022    Palpitations    Syncope and collapse 05/19/2022   Vertigo     Past Surgical History:  Procedure Laterality Date   BIOPSY  07/13/2022   Procedure: BIOPSY;  Surgeon: Elois Hair, MD;  Location[facility] ENDOSCOPY;  Service: Gastroenterology;;   BIOPSY  07/15/2022   Procedure: BIOPSY;  Surgeon: Nannette Babe, MD;  Location: [facility] ENDOSCOPY;  Service: Gastroenterology;;   COLONOSCOPY N/A 07/15/2022   Procedure: COLONOSCOPY;  Surgeon: Nannette Babe, MD;  Location: [facility] ENDOSCOPY;  Service: Gastroenterology;  Laterality: N/A;   CORONARY STENT INTERVENTION N/A 05/20/2022   Procedure: CORONARY STENT INTERVENTION;  Surgeon: Avanell Leigh, MD;  Location: [facility] INVASIVE CV LAB;  Service: Cardiovascular;  Laterality: N/A;   ESOPHAGOGASTRODUODENOSCOPY N/A 07/13/2022   Procedure: ESOPHAGOGASTRODUODENOSCOPY (EGD);  Surgeon: Elois Hair, MD;  Location: [facility] ENDOSCOPY;  Service: Gastroenterology;  Laterality: N/A;   LEFT HEART CATH AND CORONARY ANGIOGRAPHY N/A 05/20/2022   Procedure: LEFT HEART CATH AND CORONARY ANGIOGRAPHY;  Surgeon: Avanell Leigh, MD;  Location: [facility] INVASIVE CV LAB;  Service: Cardiovascular;  Laterality: N/A;   NO PAST SURGERIES      Social History   Socioeconomic History   Marital status: Married    Spouse name: Not on file  Number of children: 4   Years of education: Not on file   Highest education level: GED or equivalent  Occupational History   Occupation: Maintenance Tech  Tobacco Use   Smoking status: Never   Smokeless tobacco: Never  Vaping Use   Vaping status: Never Used  Substance and Sexual Activity   Alcohol use: No   Drug use: No   Sexual activity: Yes    Partners: Female    Birth control/protection: None  Other Topics Concern   Not on file  Social History Narrative   Not on file   Social Drivers of Health   Financial Resource Strain: Patient Declined (03/11/2023)   Overall Financial Resource Strain (CARDIA)    Difficulty of Paying Living  Expenses: Patient declined  Food Insecurity: Patient Declined (03/11/2023)   Hunger Vital Sign    Worried About Running Out of Food in the Last Year: Patient declined    Ran Out of Food in the Last Year: Patient declined  Transportation Needs: Patient Declined (03/11/2023)   PRAPARE - Administrator, Civil Service (Medical): Patient declined    Lack of Transportation (Non-Medical): Patient declined  Physical Activity: Insufficiently Active (03/11/2023)   Exercise Vital Sign    Days of Exercise per Week: 1 day    Minutes of Exercise per Session: 10 min  Stress: No Stress Concern Present (03/11/2023)   [employer] of Occupational Health - Occupational Stress Questionnaire    Feeling of Stress : Only a little  Social Connections: Unknown (03/11/2023)   Social Connection and Isolation Panel [NHANES]    Frequency of Communication with Friends and Family: Patient declined    Frequency of Social Gatherings with Friends and Family: Patient declined    Attends Religious Services: Patient declined    Database administrator or Organizations: No    Attends Engineer, structural: Not on file    Marital Status: Married  Catering manager Violence: Not At Risk (07/12/2022)   Humiliation, Afraid, Rape, and Kick questionnaire    Fear of Current or Ex-Partner: No    Emotionally Abused: No    Physically Abused: No    Sexually Abused: No    Family History  Problem Relation Age of Onset   Heart disease Mother    Breast cancer Maternal Grandmother        diagnosed late 50's   Breast cancer Maternal Aunt     Anti-infectives: Anti-infectives (From admission, onward)    None       Current Outpatient Medications  Medication Sig Dispense Refill   NONFORMULARY OR COMPOUNDED ITEM Prostin 10 mcg/ml + Papaverine 30 mg/ml + Phentolamine 1.0 mg/ml. Dispense 5 ml. Instructions: Inject 0.5 -1 ml into penis prior to sex as directed. No more than once every other day. 5 each 11    omeprazole (PRILOSEC) 20 MG capsule Take 20 mg by mouth daily.     aspirin  EC 81 MG tablet Take 1 tablet (81 mg total) by mouth daily. Swallow whole. 90 tablet 1   carvedilol  (COREG ) 3.125 MG tablet Take 1 tablet (3.125 mg total) by mouth 2 (two) times daily with a meal. 180 tablet 1   clopidogrel  (PLAVIX ) 75 MG tablet Take 1 tablet (75 mg total) by mouth daily. (Need appointment) 90 tablet 0   insulin  isophane & regular human KwikPen (NOVOLIN  70/30 KWIKPEN) (70-30) 100 UNIT/ML KwikPen Inject 16 Units into the skin 2 (two) times daily. 30 mL 3   Insulin  Pen Needle (TRUEPLUS PEN  NEEDLES) 31G X 5 MM MISC USE AS DIRECTED FOUR TIMES DAILY 200 each 3   midodrine  (PROAMATINE ) 10 MG tablet Take 1 tablet (10 mg total) by mouth 3 (three) times daily. 270 tablet 3   No current facility-administered medications for this visit.     Objective: Vital signs in last 24 hours: BP 96/67   Pulse 89   Intake/Output from previous day: No intake/output data recorded. Intake/Output this shift: @IOTHISSHIFT @   Physical Exam Vitals reviewed.  Constitutional:      Appearance: Normal appearance.  Genitourinary:    Comments: He has a circ phallus with adequate meatus.  The penis is somewhat buried. Scrotum and testes normal.  Neurological:     Mental Status: He is alert.     Lab Results:  No results found for this or any previous visit (from the past 24 hours).    BMET No results for input(s): "NA", "K", "CL", "CO2", "GLUCOSE", "BUN", "CREATININE", "CALCIUM " in the last 72 hours. PT/INR No results for input(s): "LABPROT", "INR" in the last 72 hours. ABG No results for input(s): "PHART", "HCO3" in the last 72 hours.  Invalid input(s): "PCO2", "PO2" UA has 3+ glucose.  Recent labs reviewed including testosterone  panel.  Studies/Results: No results found.   Assessment/Plan: ED.  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  He didn't respond to oral meds.  I discussed again injection therapy, VED and IPP.   He is interested in injection therapy so I sent a script and he will return for training.  Risks of injection therapy reviewed.   Hypogonadism.  He has started the testosterone  but hasn't noticed much benefit.  I will get labs today and prior to f/u in 3 months.   Meds ordered this encounter  Medications   NONFORMULARY OR COMPOUNDED ITEM    Sig: Prostin 10 mcg/ml + Papaverine 30 mg/ml + Phentolamine 1.0 mg/ml. Dispense 5 ml. Instructions: Inject 0.5 -1 ml into penis prior to sex as directed. No more than once every other day.    Dispense:  5 each    Refill:  11     Orders Placed This Encounter  Procedures   Estradiol      Return in about 2 weeks (around 08/28/2023) for with me or available MD.  You can double book if need be..    CC: Adella Agee NP     Homero Luster 08/14/2023

## 2023-08-15 ENCOUNTER — Other Ambulatory Visit (HOSPITAL_COMMUNITY): Payer: Self-pay

## 2023-08-15 ENCOUNTER — Other Ambulatory Visit: Payer: Self-pay

## 2023-08-15 ENCOUNTER — Encounter: Payer: Self-pay | Admitting: Urology

## 2023-08-15 ENCOUNTER — Ambulatory Visit: Payer: Self-pay

## 2023-08-15 LAB — ESTRADIOL: Estradiol: 28.1 pg/mL (ref 7.6–42.6)

## 2023-08-18 ENCOUNTER — Other Ambulatory Visit (HOSPITAL_COMMUNITY): Payer: Self-pay

## 2023-08-21 ENCOUNTER — Other Ambulatory Visit (HOSPITAL_COMMUNITY): Payer: Self-pay

## 2023-08-26 ENCOUNTER — Other Ambulatory Visit: Payer: Self-pay

## 2023-08-26 MED ORDER — NONFORMULARY OR COMPOUNDED ITEM: 11 refills | Status: DC

## 2023-08-28 ENCOUNTER — Encounter: Payer: Self-pay | Admitting: Urology

## 2023-08-28 ENCOUNTER — Ambulatory Visit (INDEPENDENT_AMBULATORY_CARE_PROVIDER_SITE_OTHER): Admitting: Urology

## 2023-08-28 VITALS — BP 103/70 | HR 89

## 2023-08-28 DIAGNOSIS — N5201 Erectile dysfunction due to arterial insufficiency: Secondary | ICD-10-CM | POA: Diagnosis not present

## 2023-08-28 DIAGNOSIS — E291 Testicular hypofunction: Secondary | ICD-10-CM | POA: Diagnosis not present

## 2023-08-28 LAB — URINALYSIS, ROUTINE W REFLEX MICROSCOPIC
Bilirubin, UA: NEGATIVE
Ketones, UA: NEGATIVE
Leukocytes,UA: NEGATIVE
Nitrite, UA: NEGATIVE
Protein,UA: NEGATIVE
RBC, UA: NEGATIVE
Specific Gravity, UA: 1.025 (ref 1.005–1.030)
Urobilinogen, Ur: 1 mg/dL (ref 0.2–1.0)
pH, UA: 6 (ref 5.0–7.5)

## 2023-08-28 NOTE — Progress Notes (Unsigned)
 Subjective: 1. Hypogonadism in male   2. Erectile dysfunction due to arterial insufficiency    08/28/23: Micheal Neal returns today in f/u for a penile test injection.    5/15/25[name] returns today in f/u for his history of hypogonadism.  His Hgb is 13.7 and his T is 286 on TRT.   He has ED as well.  He has no response to the oral meds in combination.    8/15/24[name] returns today in f/u.  His testosterone  level was 220 on labs after his last visit and he was sent testosterone  cypionate to use 100mg  IM weekly.  He has not noticed a benefit yet. He is also here to assess his response to sildenafil  for the ED.   He didn't respond to either sildenafil  or tadalafil  alone or in combination.  He continues to have ejaculatory dysfunction.   08/15/22: Micheal Neal is a 53 yo male who presents for evaluation of ED.  He reports progressive issues over the past year.  He is not able to get any tumescence.  He can reach a climax but has no ejaculate.  He has a good libido.  He has had a history of low T and elevated estradiol  and gynecomastia on the right.  He has intermittent pain and firmness on the right.  He has had no prior treatment except cialis  which didn't work.  He has lost 12 lb since February after an MI.  He had a stent placed.   He has neuropathy.  He is voiding well with an IPSS of 5 and nocturia x 3.  He is on Plavix  but no NTG.  ROS:  Review of Systems  Neurological:  Positive for dizziness.  All other systems reviewed and are negative.   No Known Allergies  Past Medical History:  Diagnosis Date   Abnormal EKG    AKI (acute kidney injury) (HCC) 05/21/2022   Anemia 07/11/2022   CAD (coronary artery disease)    Contusion of left foot    Diabetes mellitus without complication (HCC)    Dizziness    Encounter for hepatitis C screening test for low risk patient 07/11/2022   Gastrointestinal hemorrhage 07/13/2022   GERD (gastroesophageal reflux disease)    History of myocardial infarction within  last 3 months 07/13/2022   Near syncope    Non-STEMI (non-ST elevated myocardial infarction) (HCC) 05/19/2022   Palpitations    Syncope and collapse 05/19/2022   Vertigo     Past Surgical History:  Procedure Laterality Date   BIOPSY  07/13/2022   Procedure: BIOPSY;  Surgeon: Elois Hair, MD;  Location: [facility] ENDOSCOPY;  Service: Gastroenterology;;   BIOPSY  07/15/2022   Procedure: BIOPSY;  Surgeon: Nannette Babe, MD;  Location: [facility] ENDOSCOPY;  Service: Gastroenterology;;   COLONOSCOPY N/A 07/15/2022   Procedure: COLONOSCOPY;  Surgeon: Nannette Babe, MD;  Location: [facility] ENDOSCOPY;  Service: Gastroenterology;  Laterality: N/A;   CORONARY STENT INTERVENTION N/A 05/20/2022   Procedure: CORONARY STENT INTERVENTION;  Surgeon: Avanell Leigh, MD;  Location: [facility] INVASIVE CV LAB;  Service: Cardiovascular;  Laterality: N/A;   ESOPHAGOGASTRODUODENOSCOPY N/A 07/13/2022   Procedure: ESOPHAGOGASTRODUODENOSCOPY (EGD);  Surgeon: Elois Hair, MD;  Location: [facility] ENDOSCOPY;  Service: Gastroenterology;  Laterality: N/A;   LEFT HEART CATH AND CORONARY ANGIOGRAPHY N/A 05/20/2022   Procedure: LEFT HEART CATH AND CORONARY ANGIOGRAPHY;  Surgeon: Avanell Leigh, MD;  Location: [facility] INVASIVE CV LAB;  Service: Cardiovascular;  Laterality: N/A;   NO PAST SURGERIES  Social History   Socioeconomic History   Marital status: Married    Spouse name: Not on file   Number of children: 4   Years of education: Not on file   Highest education level: GED or equivalent  Occupational History   Occupation: Maintenance Tech  Tobacco Use   Smoking status: Never   Smokeless tobacco: Never  Vaping Use   Vaping status: Never Used  Substance and Sexual Activity   Alcohol use: No   Drug use: No   Sexual activity: Yes    Partners: Female    Birth control/protection: None  Other Topics Concern   Not on file  Social History Narrative   Not on file   Social Drivers of Health   Financial Resource Strain:  Patient Declined (03/11/2023)   Overall Financial Resource Strain (CARDIA)    Difficulty of Paying Living Expenses: Patient declined  Food Insecurity: Patient Declined (03/11/2023)   Hunger Vital Sign    Worried About Running Out of Food in the Last Year: Patient declined    Ran Out of Food in the Last Year: Patient declined  Transportation Needs: Patient Declined (03/11/2023)   PRAPARE - Administrator, Civil Service (Medical): Patient declined    Lack of Transportation (Non-Medical): Patient declined  Physical Activity: Insufficiently Active (03/11/2023)   Exercise Vital Sign    Days of Exercise per Week: 1 day    Minutes of Exercise per Session: 10 min  Stress: No Stress Concern Present (03/11/2023)   [employer] of Occupational Health - Occupational Stress Questionnaire    Feeling of Stress : Only a little  Social Connections: Unknown (03/11/2023)   Social Connection and Isolation Panel [NHANES]    Frequency of Communication with Friends and Family: Patient declined    Frequency of Social Gatherings with Friends and Family: Patient declined    Attends Religious Services: Patient declined    Database administrator or Organizations: No    Attends Engineer, structural: Not on file    Marital Status: Married  Catering manager Violence: Not At Risk (07/12/2022)   Humiliation, Afraid, Rape, and Kick questionnaire    Fear of Current or Ex-Partner: No    Emotionally Abused: No    Physically Abused: No    Sexually Abused: No    Family History  Problem Relation Age of Onset   Heart disease Mother    Breast cancer Maternal Grandmother        diagnosed late 50's   Breast cancer Maternal Aunt     Anti-infectives: Anti-infectives (From admission, onward)    None       Current Outpatient Medications  Medication Sig Dispense Refill   aspirin  EC 81 MG tablet Take 1 tablet (81 mg total) by mouth daily. Swallow whole. 90 tablet 1   carvedilol  (COREG )  3.125 MG tablet Take 1 tablet (3.125 mg total) by mouth 2 (two) times daily with a meal. 180 tablet 1   clopidogrel  (PLAVIX ) 75 MG tablet Take 1 tablet (75 mg total) by mouth daily. (Need appointment) 90 tablet 0   insulin  isophane & regular human KwikPen (NOVOLIN  70/30 KWIKPEN) (70-30) 100 UNIT/ML KwikPen Inject 16 Units into the skin 2 (two) times daily. 30 mL 3   Insulin  Pen Needle (TRUEPLUS PEN NEEDLES) 31G X 5 MM MISC USE AS DIRECTED FOUR TIMES DAILY 200 each 3   midodrine  (PROAMATINE ) 10 MG tablet Take 1 tablet (10 mg total) by mouth 3 (three) times daily. 270 tablet  3   NONFORMULARY OR COMPOUNDED ITEM Prostin 10 mcg/ml + Papaverine 30 mg/ml + Phentolamine 1.0 mg/ml. Dispense 5 ml. Instructions: Inject 0.5 -1 ml into penis prior to sex as directed. No more than once every other day. 5 each 11   omeprazole (PRILOSEC) 20 MG capsule Take 20 mg by mouth daily.     No current facility-administered medications for this visit.     Objective: Vital signs in last 24 hours: BP 103/70   Pulse 89   Intake/Output from previous day: No intake/output data recorded. Intake/Output this shift: @IOTHISSHIFT @   Physical Exam Vitals reviewed.  Constitutional:      Appearance: Normal appearance.  Genitourinary:    Comments: He has a circ phallus with adequate meatus.  The penis is somewhat buried. Scrotum and testes normal.  Neurological:     Mental Status: He is alert.     Lab Results:  Results for orders placed or performed in visit on 08/28/23 (from the past 24 hours)  Urinalysis, Routine w reflex microscopic     Status: Abnormal   Collection Time: 08/28/23  9:19 AM  Result Value Ref Range   Specific Gravity, UA 1.025 1.005 - 1.030   pH, UA 6.0 5.0 - 7.5   Color, UA Yellow Yellow   Appearance Ur Clear Clear   Leukocytes,UA Negative Negative   Protein,UA Negative Negative/Trace   Glucose, UA 3+ (A) Negative   Ketones, UA Negative Negative   RBC, UA Negative Negative   Bilirubin, UA  Negative Negative   Urobilinogen, Ur 1.0 0.2 - 1.0 mg/dL   Nitrite, UA Negative Negative   Microscopic Examination Comment    Narrative   Performed at:  [address] [facility] [address] [place], [place]  [id number] Lab Director: Liliana Regulus MT, Phone:  [phone]      BMET No results for input(s): "NA", "K", "CL", "CO2", "GLUCOSE", "BUN", "CREATININE", "CALCIUM " in the last 72 hours. PT/INR No results for input(s): "LABPROT", "INR" in the last 72 hours. ABG No results for input(s): "PHART", "HCO3" in the last 72 hours.  Invalid input(s): "PCO2", "PO2" UA has 3+ glucose.  Recent labs reviewed including testosterone  panel.  Studies/Results:  Procedure: I explained the injection technique and precautions and then prepared a 1cc syringe with 0.5ml of the trimix and the 30g needle.  The left mid lateral penile shaft was prepped with an alcohol swab and the injection was performed.  The injection site was compressed and massaged for 30 seconds.   After he had about a 75% erection.  He was having no pain or side effects.     Assessment/Plan: ED.  He had a reasonable response to 0.5ml of trimix but may need to go up to a full syringe.   He will let me know if the erection persists beyond 3 hours.  He will return in 2mo.   Hypogonadism.  He is not on the TRT at this time.   No orders of the defined types were placed in this encounter.    Orders Placed This Encounter  Procedures   Urinalysis, Routine w reflex microscopic     Return in about 3 months (around 11/28/2023) for available provider. .    CC: Adella Agee NP     Homero Luster 08/29/2023

## 2023-09-10 ENCOUNTER — Encounter: Payer: Self-pay | Admitting: Hematology

## 2023-09-10 NOTE — Progress Notes (Signed)
 Subjective:    Patient ID: Micheal Neal, male    DOB: 1970-04-30, 53 y.o.   MRN: [id number]      HPI Micheal Neal is here for  Chief Complaint  Patient presents with   Mass    Lump on left arm; Right elbow pain    Lump on elbow - left elbow - one month - goes down and comes back up.  He denies any pain, redness, fevers or chills.  Right elbow pain-he states pain in his right elbow.  He denies any injury.  He was at the gym lifting a lot and he has stopped, but has not improved.  He did try wearing an elbow sleeve and that seemed to help.  It has not gone away.  He denies any numbness or tingling.  He denies weakness.   Medications and allergies reviewed with patient and updated if appropriate.  Current Outpatient Medications on File Prior to Visit  Medication Sig Dispense Refill   aspirin  EC 81 MG tablet Take 1 tablet (81 mg total) by mouth daily. Swallow whole. 90 tablet 1   carvedilol  (COREG ) 3.125 MG tablet Take 1 tablet (3.125 mg total) by mouth 2 (two) times daily with a meal. 180 tablet 1   clopidogrel  (PLAVIX ) 75 MG tablet Take 1 tablet (75 mg total) by mouth daily. (Need appointment) 90 tablet 0   insulin  isophane & regular human KwikPen (NOVOLIN  70/30 KWIKPEN) (70-30) 100 UNIT/ML KwikPen Inject 16 Units into the skin 2 (two) times daily. 30 mL 3   Insulin  Pen Needle (TRUEPLUS PEN NEEDLES) 31G X 5 MM MISC USE AS DIRECTED FOUR TIMES DAILY 200 each 3   midodrine  (PROAMATINE ) 10 MG tablet Take 1 tablet (10 mg total) by mouth 3 (three) times daily. 270 tablet 3   NONFORMULARY OR COMPOUNDED ITEM Prostin 10 mcg/ml + Papaverine 30 mg/ml + Phentolamine 1.0 mg/ml. Dispense 5 ml. Instructions: Inject 0.5 -1 ml into penis prior to sex as directed. No more than once every other day. 5 each 11   omeprazole (PRILOSEC) 20 MG capsule Take 20 mg by mouth daily.     No current facility-administered medications on file prior to visit.    Review of Systems     Objective:   Vitals:    09/11/23 1022  BP: 104/70  Pulse: 80  Temp: 98 F (36.7 C)  SpO2: 99%   BP Readings from Last 3 Encounters:  09/11/23 104/70  08/28/23 103/70  08/14/23 96/67   Wt Readings from Last 3 Encounters:  09/11/23 265 lb (120.2 kg)  06/03/23 267 lb (121.1 kg)  04/21/23 262 lb 9.6 oz (119.1 kg)   Body mass index is 34.02 kg/m.    Physical Exam Constitutional:      General: He is not in acute distress.    Appearance: Normal appearance. He is not ill-appearing.  HENT:     Head: Normocephalic and atraumatic.   Musculoskeletal:     Comments: Left elbow with a large olecranon bursa sac-nontender, no overlying erythema or warmth.  No pain with elbow flexion or extension  Right elbow with full range of motion.  Tenderness on medial epicondyle-no other tenderness noted in elbow, forearm or upper arm   Skin:    General: Skin is warm and dry.     Findings: No bruising, erythema or rash.   Neurological:     Mental Status: He is alert.            Assessment & Plan:  Left elbow bursitis- Acute Discussed likely from pressure on the elbow for prolonged period of time-likely when he was lifting weights-doing biceps I has gone up and gone down over the last couple of weeks-advised no pressure on the elbow and this should go down to normal Would recommend avoiding drainage given that he is a diabetic and increased risk of infection Advised to monitor for any redness, pain, warmth or fevers in which case he would need an antibiotic  Right elbow medial epicondylitis- Acute Discussed this is tendinitis from overuse-likely related to lifting weights He has not gone to the gym and has improved a little bit, but has not resolved Continue wearing elbow sleeve And take over-the-counter NSAIDs for a couple weeks to see if that helps Advised activities If not improving can refer to sports medicine for possible injection

## 2023-09-11 ENCOUNTER — Ambulatory Visit: Admitting: Internal Medicine

## 2023-09-11 ENCOUNTER — Ambulatory Visit: Payer: 59 | Admitting: Nurse Practitioner

## 2023-09-11 VITALS — BP 104/70 | HR 80 | Temp 98.0°F | Ht 74.0 in | Wt 265.0 lb

## 2023-09-11 DIAGNOSIS — M7022 Olecranon bursitis, left elbow: Secondary | ICD-10-CM

## 2023-09-11 DIAGNOSIS — M7701 Medial epicondylitis, right elbow: Secondary | ICD-10-CM | POA: Diagnosis not present

## 2023-09-11 NOTE — Patient Instructions (Addendum)
 Return if symptoms worsen or fail to improve.   Elbow Bursitis  Elbow (olecranon) bursitis is the swelling of the fluid-filled sac (bursa) between the tip of your elbow and your skin. A bursa acts as a cushion and protects the joint. If the bursa becomes irritated, it can fill with extra fluid and become inflamed. This condition is also called olecranon bursitis. What are the causes? This condition may be caused by: An elbow injury, such as falling onto the elbow. Leaning on hard surfaces for long periods of time. Infection from an injury that breaks the skin near the elbow. A bone growth (spur) that forms at the tip of the elbow. A medical condition that causes inflammation, such as gout or rheumatoid arthritis. Sometimes the cause is not known. What are the signs or symptoms? The first sign of elbow bursitis is usually swelling at the tip of the elbow. This can grow to be about the size of a golf ball. Swelling may start suddenly or develop gradually. Other symptoms may include: Pain when bending or leaning on the elbow. Stiffness, or not being able to move the elbow normally. If bursitis is caused by an infection, you may have: Redness, warmth, and tenderness of the elbow. Drainage of pus from the swollen area over the elbow, if the skin breaks open. How is this diagnosed? This condition may be diagnosed based on: Your symptoms and medical history. Any recent injuries you have had. A physical exam. X-rays to check for a bone spur or fracture. Draining fluid from the bursa to test it for infection. Blood tests to rule out gout or rheumatoid arthritis. How is this treated? Treatment for this condition depends on the cause. Treatment may include: Medicines. These may include: Over-the-counter medicines to relieve pain and inflammation. Antibiotic medicines if there is an infection. Injections of anti-inflammatory medicines (steroids). Draining fluid from the bursa. Wrapping  your elbow with a bandage or compression arm sleeve. Wearing elbow pads. If these treatments do not help, you may need surgery to remove the bursa. Follow these instructions at home: Medicines Take over-the-counter and prescription medicines only as told by your health care provider. If you were prescribed an antibiotic medicine, take it as told by your health care provider. Do not stop taking the antibiotic even if you start to feel better. Managing pain, stiffness, and swelling     If directed, put ice on the affected area. To do this: Put ice in a plastic bag. Place a towel between your skin and the bag. Leave the ice on for 20 minutes, 2-3 times a day. Remove the ice if your skin turns bright red. This is very important. If you cannot feel pain, heat, or cold, you have a greater risk of damage to the area. If directed, apply heat to the affected area as often as told by your health care provider. Use the heat source that your health care provider recommends, such as a moist heat pack or a heating pad. Place a towel between your skin and the heat source. Leave the heat on 20-30 minutes. Remove the heat if your skin turns bright red. This is especially important if you are unable to feel pain, heat, or cold. You may have a greater risk of getting burned. If your bursitis is caused by an injury, rest your elbow and wear your bandage or sleeve as told by your health care provider. Use elbow pads or elbow wraps to cushion your elbow and control swelling  as needed. General instructions Avoid any activities that cause elbow pain. Ask your health care provider what activities are safe for you. Keep all follow-up visits. This is important. Contact a health care provider if: You have a fever. Your symptoms do not get better with treatment. You have pain or swelling that gets worse, or it goes away and then comes back. You have pus draining from your elbow. You have redness around the elbow  area. Your elbow is warm to the touch. Get help right away if: You have trouble moving your arm, hand, or fingers. Summary Elbow (olecranon) bursitis is the swelling of the fluid-filled sac (bursa) between the tip of your elbow and your skin. Treatment for elbow bursitis depends on the cause. It may include medicines to relieve pain and inflammation, antibiotic medicines to treat an infection, or draining fluid from your elbow. Contact a health care provider if your symptoms do not get better with treatment, or if your symptoms go away and then come back. This information is not intended to replace advice given to you by your health care provider. Make sure you discuss any questions you have with your health care provider. Document Revised: 03/13/2021 Document Reviewed: 03/13/2021 Elsevier Patient Education  2024 Else[employer] Inc.    Golfer's Elbow: What to Know  Golfer's elbow, or medial epicondylitis, is caused by swelling or damage to some of the tendons in your elbow. Tendons are the strong tissues that connect muscles to bones. Golfer's elbow affects the tendons that connect to the muscles that help you bend your palm toward your wrist. These tendons get less flexible as you get older. Golfer's elbow is more common among people who bend and twist their wrists a lot, such as golfers. What are the causes? Golfer's elbow is caused by: Flexing, turning, or twisting your wrist over and over. Often gripping things with your hands. Sudden injury. What increases the risk? You're more likely to get golfer's elbow if: You play certain sports, such as: Systems analyst. Baseball. Tennis. You have to use your hands a lot at your job. This includes if you use computers at work or if you're: A Music therapist. A butcher. A musician. What are the signs or symptoms? Symptoms include: Pain in: Your elbow. Your forearm. Your wrist. A weak grip in the hand. The pain may get worse when you bend your wrist  downward. How is this diagnosed? Golfer's elbow is diagnosed based on your symptoms, your medical history, and an exam. During the exam, your health care provider may: Test your grip strength. Move your wrist to check for pain. You may also have an MRI to: See if you have golfer's elbow. Look for other issues. Check for tears in your muscles or tendons. How is this treated? You may need to: Stop doing things that make you bend or twist your elbow or wrist. Wait until the pain is all gone before doing those things again. Wear an elbow brace or wrist splint. Ice your inner elbow, forearm, or wrist. Take NSAIDs, such as ibuprofen, or get steroid shots. These can help with pain and swelling. Do physical therapy as told by your provider. In rare cases, you may need surgery if you aren't getting better. Follow these instructions at home: If you have a brace or splint: Wear the brace or splint as told. Take it off only if your provider says you can. Check the skin around it every day. Tell your provider if you see problems. Loosen the brace or  splint if your fingers tingle, are numb, or turn cold and blue. Keep the brace or splint clean. If the brace or splint isn't waterproof: Do not let it get wet. Cover it when you take a bath or shower. Use a cover that doesn't let any water in. Managing pain, stiffness, and swelling  Use ice or an ice pack as told. If you have a splint or brace that you can take off, remove it only as told. Place a towel between your skin and the ice. Leave the ice on for 20 minutes, 2-3 times a day. If your skin turns red, take off the ice right away to prevent skin damage. The risk of damage is higher if you can't feel pain, heat, or cold. Move your fingers often to avoid stiffness and swelling. Raise your arm above the level of your heart while you're sitting or lying down. Use pillows as needed. Activity Rest your arm as told. Exercise as told. Ask what things  are safe for you to do at home. Ask when you can go back to work or school. Lifestyle If you got golfer's elbow from playing sports, work with a Psychologist, educational. They can make sure that you: Use the right technique. Use the right equipment. If you got golfer's elbow from working, talk with your work about how to manage your condition. General instructions Take your medicines only as told. Do not smoke, vape, or use nicotine or tobacco. How is this prevented? Warm up and stretch before being active. Cool down and stretch after being active. Give your body time to rest. Stay fit. Keep your body strong and flexible. Use sports equipment that fits you. If you play golf, slow your golf swing. This can help reduce shock in the arm when making contact with the ball. Contact a health care provider if: Your pain doesn't get better. Your pain gets worse. Your hand goes numb. Get help right away if: You have very bad pain. You can't move your wrist. This information is not intended to replace advice given to you by your health care provider. Make sure you discuss any questions you have with your health care provider. Document Revised: 11/11/2022 Document Reviewed: 11/11/2022 Elsevier Patient Education  2024 [employer].

## 2023-09-19 ENCOUNTER — Other Ambulatory Visit (HOSPITAL_COMMUNITY): Payer: Self-pay

## 2023-10-24 ENCOUNTER — Ambulatory Visit: Admitting: Nurse Practitioner

## 2023-11-03 ENCOUNTER — Other Ambulatory Visit: Payer: Self-pay | Admitting: Cardiovascular Disease

## 2023-11-03 ENCOUNTER — Other Ambulatory Visit: Payer: Self-pay | Admitting: Internal Medicine

## 2023-11-05 ENCOUNTER — Other Ambulatory Visit (HOSPITAL_BASED_OUTPATIENT_CLINIC_OR_DEPARTMENT_OTHER): Payer: Self-pay

## 2023-11-05 ENCOUNTER — Other Ambulatory Visit (HOSPITAL_COMMUNITY): Payer: Self-pay

## 2023-11-05 MED ORDER — ASPIRIN 81 MG PO TBEC
81.0000 mg | DELAYED_RELEASE_TABLET | Freq: Every day | ORAL | 1 refills | Status: AC
  Filled 2023-11-05: qty 30, 30d supply, fill #0
  Filled 2023-11-26: qty 30, 30d supply, fill #1

## 2023-11-05 NOTE — Telephone Encounter (Signed)
 Last OV 09/11/23 - acute Last med check 04/21/23, f/u 4 weeks  Last refill 04/17/23 Qty #90/1

## 2023-11-06 ENCOUNTER — Other Ambulatory Visit (HOSPITAL_COMMUNITY): Payer: Self-pay

## 2023-11-06 MED ORDER — CLOPIDOGREL BISULFATE 75 MG PO TABS
75.0000 mg | ORAL_TABLET | Freq: Every day | ORAL | 0 refills | Status: DC
  Filled 2023-11-06: qty 30, 30d supply, fill #0

## 2023-11-10 ENCOUNTER — Ambulatory Visit
Admission: RE | Admit: 2023-11-10 | Discharge: 2023-11-10 | Disposition: A | Discharge status: Home / Self Care | Source: Ambulatory Visit | Attending: Nurse Practitioner | Admitting: Nurse Practitioner

## 2023-11-10 ENCOUNTER — Encounter: Payer: Self-pay | Admitting: Oncology

## 2023-11-10 ENCOUNTER — Other Ambulatory Visit (HOSPITAL_COMMUNITY): Payer: Self-pay

## 2023-11-10 ENCOUNTER — Encounter: Payer: Self-pay | Admitting: Hematology

## 2023-11-10 VITALS — BP 101/69 | HR 84 | Temp 98.4°F | Resp 16

## 2023-11-10 DIAGNOSIS — H5789 Other specified disorders of eye and adnexa: Secondary | ICD-10-CM

## 2023-11-10 MED ORDER — TOBRAMYCIN-DEXAMETHASONE 0.3-0.1 % OP SUSP
1.0000 [drp] | Freq: Four times a day (QID) | OPHTHALMIC | 0 refills | Status: AC
  Filled 2023-11-10: qty 5, 7d supply, fill #0

## 2023-11-10 NOTE — ED Triage Notes (Signed)
 Redness, headache, sensitivity to light in right eye x 1.5 weeks. Taking tylenol .

## 2023-11-10 NOTE — ED Provider Notes (Signed)
 [facility]    CSN: [id number] Arrival date & time: 11/10/23  1348      History   Chief Complaint Chief Complaint  Patient presents with   Eye Problem    Right eye is swollen. Having a lot of pain and redness. - Entered by patient    HPI Micheal Neal is a 53 y.o. male.   The history is provided by the patient.   Patient presents with a 1 and half week history of right eye redness and pain.  Patient endorses light sensitivity and headache.  Patient states symptoms started with the right eye itching, and worsened after he rubbed it.  He denies fever, chills, purulent drainage from the eye, visual changes, or swelling around the eye.  Patient states he has been taking Tylenol  for his symptoms.  Patient reports that he is supposed to wear glasses, but does not wear them regularly.  He denies any close sick contacts.  No prior history of seasonal allergies.  Past Medical History:  Diagnosis Date   Abnormal EKG    AKI (acute kidney injury) (HCC) 05/21/2022   Anemia 07/11/2022   CAD (coronary artery disease)    Contusion of left foot    Diabetes mellitus without complication (HCC)    Dizziness    Encounter for hepatitis C screening test for low risk patient 07/11/2022   Gastrointestinal hemorrhage 07/13/2022   GERD (gastroesophageal reflux disease)    History of myocardial infarction within last 3 months 07/13/2022   Near syncope    Non-STEMI (non-ST elevated myocardial infarction) (HCC) 05/19/2022   Palpitations    Syncope and collapse 05/19/2022   Vertigo     Patient Active Problem List   Diagnosis Date Noted   Heart palpitations 03/13/2023   Need for vaccination 03/13/2023   Encounter for general adult medical examination with abnormal findings 03/13/2023   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   S/P insertion of non-drug eluting coronary artery stent 07/13/2022   ABLA (acute blood loss anemia) 07/12/2022   B12 deficiency 07/11/2022   Vitamin D   deficiency 06/06/2022   Coronary artery disease involving native coronary artery of native heart 06/06/2022   Erectile dysfunction 06/06/2022   Charcot's joint of foot, left 05/30/2022   Orthostatic hypotension 01/21/2022   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) 01/21/2022   Hypertriglyceridemia 11/07/2021   Diabetes 1.5, 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   Hyperlipidemia with target LDL less than 100 11/06/2021   Hypotension 11/06/2021   Dizziness 08/13/2021   Obesity (BMI 30-39.9) 05/24/2017   Insomnia 10/15/2014   Diabetic neuropathy (HCC) 10/14/2014   Allergic rhinitis 07/01/2014    Past Surgical History:  Procedure Laterality Date   BIOPSY  07/13/2022   Procedure: BIOPSY;  Surgeon: Stacia Glendia BRAVO, MD;  Location: [facility] ENDOSCOPY;  Service: Gastroenterology;;   BIOPSY  07/15/2022   Procedure: BIOPSY;  Surgeon: Albertus Gordy HERO, MD;  Location: [facility] ENDOSCOPY;  Service: Gastroenterology;;   COLONOSCOPY N/A 07/15/2022   Procedure: COLONOSCOPY;  Surgeon: Albertus Gordy HERO, MD;  Location: [facility] ENDOSCOPY;  Service: Gastroenterology;  Laterality: N/A;   CORONARY STENT INTERVENTION N/A 05/20/2022   Procedure: CORONARY STENT INTERVENTION;  Surgeon: Court Dorn PARAS, MD;  Location: [facility] INVASIVE CV LAB;  Service: Cardiovascular;  Laterality: N/A;   ESOPHAGOGASTRODUODENOSCOPY N/A 07/13/2022   Procedure: ESOPHAGOGASTRODUODENOSCOPY (EGD);  Surgeon: Stacia Glendia BRAVO, MD;  Location: [facility] ENDOSCOPY;  Service: Gastroenterology;  Laterality: N/A;   LEFT HEART CATH AND CORONARY ANGIOGRAPHY N/A 05/20/2022  Procedure: LEFT HEART CATH AND CORONARY ANGIOGRAPHY;  Surgeon: Court Dorn PARAS, MD;  Location: [facility] INVASIVE CV LAB;  Service: Cardiovascular;  Laterality: N/A;   NO PAST SURGERIES         Home Medications    Prior to Admission medications   Medication Sig Start Date End Date Taking? Authorizing Provider  aspirin  EC (ASPIRIN  LOW DOSE) 81 MG tablet Take 1 tablet (81 mg total) by  mouth daily. Swallow whole. 11/05/23  Yes Webb, Padonda B, FNP  carvedilol  (COREG ) 3.125 MG tablet Take 1 tablet (3.125 mg total) by mouth 2 (two) times daily with a meal. 06/13/23  Yes Elnor Lauraine BRAVO, NP  clopidogrel  (PLAVIX ) 75 MG tablet Take 1 tablet (75 mg total) by mouth daily. (Need appointment) 11/06/23  Yes Verlin Lonni BIRCH, MD  insulin  isophane & regular human KwikPen (NOVOLIN  70/30 KWIKPEN) (70-30) 100 UNIT/ML KwikPen Inject 16 Units into the skin 2 (two) times daily. 06/03/23  Yes Shamleffer, Ibtehal Jaralla, MD  midodrine  (PROAMATINE ) 10 MG tablet Take 1 tablet (10 mg total) by mouth 3 (three) times daily. 10/11/22  Yes Verlin Lonni BIRCH, MD  omeprazole (PRILOSEC) 20 MG capsule Take 20 mg by mouth daily.   Yes [provider]  tobramycin -dexamethasone  (TOBRADEX ) ophthalmic solution Place 1 drop into the right eye every 6 (six) hours for 7 days. 11/10/23 11/17/23 Yes Leath-Warren, Etta PARAS, NP  Insulin  Pen Needle (TRUEPLUS PEN NEEDLES) 31G X 5 MM MISC USE AS DIRECTED FOUR TIMES DAILY 05/31/22   Shamleffer, Ibtehal Jaralla, MD  NONFORMULARY OR COMPOUNDED ITEM Prostin 10 mcg/ml + Papaverine 30 mg/ml + Phentolamine 1.0 mg/ml. Dispense 5 ml. Instructions: Inject 0.5 -1 ml into penis prior to sex as directed. No more than once every other day. 08/26/23   Watt Rush, MD    Family History Family History  Problem Relation Age of Onset   Heart disease Mother    Breast cancer Maternal Grandmother        diagnosed late 34's   Breast cancer Maternal Aunt     Social History Social History   Tobacco Use   Smoking status: Never   Smokeless tobacco: Never  Vaping Use   Vaping status: Never Used  Substance Use Topics   Alcohol use: No   Drug use: No     Allergies   Patient has no known allergies.   Review of Systems Review of Systems Per HPI  Physical Exam Triage Vital Signs ED Triage Vitals [11/10/23 1426]  Encounter Vitals Group     BP 101/69     Girls Systolic  BP Percentile      Girls Diastolic BP Percentile      Boys Systolic BP Percentile      Boys Diastolic BP Percentile      Pulse Rate 84     Resp 16     Temp 98.4 F (36.9 C)     Temp Source Oral     SpO2 98 %     Weight      Height      Head Circumference      Peak Flow      Pain Score 10     Pain Loc      Pain Education      Exclude from Growth Chart    No data found.  Updated Vital Signs BP 101/69 (BP Location: Right Arm)   Pulse 84   Temp 98.4 F (36.9 C) (Oral)   Resp 16   SpO2 98%  Visual Acuity Right Eye Distance: 20/25 Left Eye Distance: 20/2025 Bilateral Distance: 20/30  Right Eye Near:   Left Eye Near:    Bilateral Near:     Physical Exam Vitals and nursing note reviewed.  Constitutional:      General: He is not in acute distress.    Appearance: Normal appearance.  HENT:     Head: Normocephalic.     Right Ear: Tympanic membrane, ear canal and external ear normal.     Left Ear: Tympanic membrane, ear canal and external ear normal.     Nose: Nose normal.     Mouth/Throat:     Mouth: Mucous membranes are moist.  Eyes:     General: Lids are normal. Vision grossly intact. No visual field deficit.       Right eye: No foreign body, discharge or hordeolum.     Extraocular Movements: Extraocular movements intact.     Conjunctiva/sclera:     Right eye: Right conjunctiva is injected. No chemosis or exudate.    Pupils: Pupils are equal, round, and reactive to light.  Pulmonary:     Effort: Pulmonary effort is normal.  Musculoskeletal:     Cervical back: Normal range of motion.  Skin:    General: Skin is warm and dry.  Neurological:     General: No focal deficit present.     Mental Status: He is alert and oriented to person, place, and time.  Psychiatric:        Mood and Affect: Mood normal.        Behavior: Behavior normal.      UC Treatments / Results  Labs (all labs ordered are listed, but only abnormal results are displayed) Labs Reviewed -  No data to display  EKG   Radiology No results found.  Procedures Procedures (including critical care time)  Medications Ordered in UC Medications - No data to display  Initial Impression / Assessment and Plan / UC Course  I have reviewed the triage vital signs and the nursing notes.  Pertinent labs & imaging results that were available during my care of the patient were reviewed by me and considered in my medical decision making (see chart for details).  The right conjunctiva is injected.  There is no preseptal or preorbital swelling.  Patient does endorse some light sensitivity.  There is no oozing or drainage from the right eye. Visual acuity was intact.  Will treat empirically with TobraDex  eyedrops to cover for possible infection.  Supportive care recommendations were provided discussed with the patient to include over-the-counter analgesics, cool compresses to the eye, and to avoid rubbing or manipulating the eye while symptoms persist.  Patient was given indications regarding follow-up.  Patient advised to follow-up with his eye doctor if symptoms fail to improve with this treatment.  Patient was in agreement with this plan of care and verbalizes understanding.  All questions were answered.  Patient stable for discharge.   Final Clinical Impressions(s) / UC Diagnoses   Final diagnoses:  Irritation of right eye     Discharge Instructions      Use eyedrops as prescribed.   Apply cool compresses to the right eye to help with pain or swelling.  You may apply warm compresses to help with pain or discomfort. Recommend over-the-counter Clear Eyes or Visine eyedrops to help keep the eye moist and lubricated.   Strict handwashing when applying medication.  Avoid rubbing or manipulating the eyes while symptoms persist. If symptoms fail to improve  with this treatment, please follow-up with your eye doctor for further evaluation. Follow-up as needed.     ED Prescriptions      Medication Sig Dispense Auth. Provider   tobramycin -dexamethasone  (TOBRADEX ) ophthalmic solution Place 1 drop into the right eye every 6 (six) hours for 7 days. 5 mL Leath-Warren, Etta PARAS, NP      PDMP not reviewed this encounter.   Gilmer Etta PARAS, NP 11/10/23 1453

## 2023-11-10 NOTE — Discharge Instructions (Addendum)
 Use eyedrops as prescribed.   Apply cool compresses to the right eye to help with pain or swelling.  You may apply warm compresses to help with pain or discomfort. Recommend over-the-counter Clear Eyes or Visine eyedrops to help keep the eye moist and lubricated.   Strict handwashing when applying medication.  Avoid rubbing or manipulating the eyes while symptoms persist. If symptoms fail to improve with this treatment, please follow-up with your eye doctor for further evaluation. Follow-up as needed.

## 2023-11-12 ENCOUNTER — Other Ambulatory Visit (HOSPITAL_COMMUNITY): Payer: Self-pay

## 2023-11-25 ENCOUNTER — Encounter: Payer: Self-pay | Admitting: Oncology

## 2023-11-26 ENCOUNTER — Other Ambulatory Visit: Payer: Self-pay | Admitting: Cardiovascular Disease

## 2023-11-26 ENCOUNTER — Other Ambulatory Visit (HOSPITAL_COMMUNITY): Payer: Self-pay

## 2023-11-26 ENCOUNTER — Other Ambulatory Visit: Payer: Self-pay

## 2023-11-26 ENCOUNTER — Encounter: Payer: Self-pay | Admitting: Oncology

## 2023-11-26 MED ORDER — CLOPIDOGREL BISULFATE 75 MG PO TABS
75.0000 mg | ORAL_TABLET | Freq: Every day | ORAL | 0 refills | Status: DC
Start: 2023-11-26 — End: 2024-01-03
  Filled 2023-11-26 – 2023-12-04 (×2): qty 30, 30d supply, fill #0

## 2023-12-01 NOTE — Progress Notes (Signed)
 Subjective: No diagnosis found.  9.2.2025: Here today for follow-up.  He has been on Trimix.  Current dose PGE 10/PAP 30/PHE/1, inject 0.5 to 1 mL as needed.  He has inadequate results from this.  Slight increase in girth but not enough for adequate penetration.  No pain with injections.  5/29/25[name] returns today in f/u for a penile test injection.    5/15/25[name] returns today in f/u for his history of hypogonadism.  His Hgb is 13.7 and his T is 286 on TRT.   He has ED as well.  He has no response to the oral meds in combination.    8/15/24[name] returns today in f/u.  His testosterone  level was 220 on labs after his last visit and he was sent testosterone  cypionate to use 100mg  IM weekly.  He has not noticed a benefit yet. He is also here to assess his response to sildenafil  for the ED.   He didn't respond to either sildenafil  or tadalafil  alone or in combination.  He continues to have ejaculatory dysfunction.   08/15/22: Micheal Neal is a 53 yo male who presents for evaluation of ED.  He reports progressive issues over the past year.  He is not able to get any tumescence.  He can reach a climax but has no ejaculate.  He has a good libido.  He has had a history of low T and elevated estradiol  and gynecomastia on the right.  He has intermittent pain and firmness on the right.  He has had no prior treatment except cialis  which didn't work.  He has lost 12 lb since February after an MI.  He had a stent placed.   He has neuropathy.  He is voiding well with an IPSS of 5 and nocturia x 3.  He is on Plavix  but no NTG.  ROS:  Review of Systems  Neurological:  Positive for dizziness.  All other systems reviewed and are negative.   No Known Allergies  Past Medical History:  Diagnosis Date   Abnormal EKG    AKI (acute kidney injury) (HCC) 05/21/2022   Anemia 07/11/2022   CAD (coronary artery disease)    Contusion of left foot    Diabetes mellitus without complication (HCC)    Dizziness     Encounter for hepatitis C screening test for low risk patient 07/11/2022   Gastrointestinal hemorrhage 07/13/2022   GERD (gastroesophageal reflux disease)    History of myocardial infarction within last 3 months 07/13/2022   Near syncope    Non-STEMI (non-ST elevated myocardial infarction) (HCC) 05/19/2022   Palpitations    Syncope and collapse 05/19/2022   Vertigo     Past Surgical History:  Procedure Laterality Date   BIOPSY  07/13/2022   Procedure: BIOPSY;  Surgeon: Stacia Glendia BRAVO, MD;  Location: [facility] ENDOSCOPY;  Service: Gastroenterology;;   BIOPSY  07/15/2022   Procedure: BIOPSY;  Surgeon: Albertus Gordy HERO, MD;  Location: [facility] ENDOSCOPY;  Service: Gastroenterology;;   COLONOSCOPY N/A 07/15/2022   Procedure: COLONOSCOPY;  Surgeon: Albertus Gordy HERO, MD;  Location: [facility] ENDOSCOPY;  Service: Gastroenterology;  Laterality: N/A;   CORONARY STENT INTERVENTION N/A 05/20/2022   Procedure: CORONARY STENT INTERVENTION;  Surgeon: Court Dorn PARAS, MD;  Location: [facility] INVASIVE CV LAB;  Service: Cardiovascular;  Laterality: N/A;   ESOPHAGOGASTRODUODENOSCOPY N/A 07/13/2022   Procedure: ESOPHAGOGASTRODUODENOSCOPY (EGD);  Surgeon: Stacia Glendia BRAVO, MD;  Location: [facility] ENDOSCOPY;  Service: Gastroenterology;  Laterality: N/A;   LEFT HEART CATH AND CORONARY ANGIOGRAPHY N/A 05/20/2022  Procedure: LEFT HEART CATH AND CORONARY ANGIOGRAPHY;  Surgeon: Court Dorn PARAS, MD;  Location: [facility] INVASIVE CV LAB;  Service: Cardiovascular;  Laterality: N/A;   NO PAST SURGERIES      Social History   Socioeconomic History   Marital status: Married    Spouse name: Not on file   Number of children: 4   Years of education: Not on file   Highest education level: 12th grade  Occupational History   Occupation: Maintenance Tech  Tobacco Use   Smoking status: Never   Smokeless tobacco: Never  Vaping Use   Vaping status: Never Used  Substance and Sexual Activity   Alcohol use: No   Drug use: No   Sexual activity: Yes     Partners: Female    Birth control/protection: None  Other Topics Concern   Not on file  Social History Narrative   Not on file   Social Drivers of Health   Financial Resource Strain: Medium Risk (09/11/2023)   Overall Financial Resource Strain (CARDIA)    Difficulty of Paying Living Expenses: Somewhat hard  Food Insecurity: Patient Declined (09/11/2023)   Hunger Vital Sign    Worried About Running Out of Food in the Last Year: Patient declined    Ran Out of Food in the Last Year: Patient declined  Transportation Needs: Unknown (09/11/2023)   PRAPARE - Transportation    Lack of Transportation (Medical): No    Lack of Transportation (Non-Medical): Patient declined  Physical Activity: Sufficiently Active (09/11/2023)   Exercise Vital Sign    Days of Exercise per Week: 4 days    Minutes of Exercise per Session: 60 min  Stress: No Stress Concern Present (09/11/2023)   [employer] of Occupational Health - Occupational Stress Questionnaire    Feeling of Stress: Only a little  Social Connections: Moderately Integrated (09/11/2023)   Social Connection and Isolation Panel    Frequency of Communication with Friends and Family: More than three times a week    Frequency of Social Gatherings with Friends and Family: Once a week    Attends Religious Services: Patient declined    Database administrator or Organizations: Yes    Attends Banker Meetings: 1 to 4 times per year    Marital Status: Married  Catering manager Violence: Not At Risk (07/12/2022)   Humiliation, Afraid, Rape, and Kick questionnaire    Fear of Current or Ex-Partner: No    Emotionally Abused: No    Physically Abused: No    Sexually Abused: No    Family History  Problem Relation Age of Onset   Heart disease Mother    Breast cancer Maternal Grandmother        diagnosed late 6's   Breast cancer Maternal Aunt     Anti-infectives: Anti-infectives (From admission, onward)    None       Current  Outpatient Medications  Medication Sig Dispense Refill   aspirin  EC (ASPIRIN  LOW DOSE) 81 MG tablet Take 1 tablet (81 mg total) by mouth daily. Swallow whole. 90 tablet 1   carvedilol  (COREG ) 3.125 MG tablet Take 1 tablet (3.125 mg total) by mouth 2 (two) times daily with a meal. 180 tablet 1   clopidogrel  (PLAVIX ) 75 MG tablet Take 1 tablet (75 mg total) by mouth daily. 30 tablet 0   insulin  isophane & regular human KwikPen (NOVOLIN  70/30 KWIKPEN) (70-30) 100 UNIT/ML KwikPen Inject 16 Units into the skin 2 (two) times daily. 30 mL 3   Insulin   Pen Needle (TRUEPLUS PEN NEEDLES) 31G X 5 MM MISC USE AS DIRECTED FOUR TIMES DAILY 200 each 3   midodrine  (PROAMATINE ) 10 MG tablet Take 1 tablet (10 mg total) by mouth 3 (three) times daily. 270 tablet 3   NONFORMULARY OR COMPOUNDED ITEM Prostin 10 mcg/ml + Papaverine 30 mg/ml + Phentolamine 1.0 mg/ml. Dispense 5 ml. Instructions: Inject 0.5 -1 ml into penis prior to sex as directed. No more than once every other day. 5 each 11   omeprazole (PRILOSEC) 20 MG capsule Take 20 mg by mouth daily.     No current facility-administered medications for this visit.     Objective: Vital signs in last 24 hours: There were no vitals taken for this visit.  Intake/Output from previous day: No intake/output data recorded. Intake/Output this shift: @IOTHISSHIFT @   Physical Exam Vitals reviewed.  Constitutional:      Appearance: Normal appearance.  Genitourinary:    Comments: He has a circ phallus with adequate meatus.  The penis is somewhat buried. Scrotum and testes normal.  Neurological:     Mental Status: He is alert.     Lab Results:  No results found for this or any previous visit (from the past 24 hours).     BMET No results for input(s): NA, K, CL, CO2, GLUCOSE, BUN, CREATININE, CALCIUM  in the last 72 hours. PT/INR No results for input(s): LABPROT, INR in the last 72 hours. ABG No results for input(s): PHART, HCO3  in the last 72 hours.  Invalid input(s): PCO2, PO2 UA has 3+ glucose.  Recent labs reviewed including testosterone  panel.  Studies/Results:  I reviewed patient's prior laboratories  I reviewed prior notes from Dr. Watt.  Most recent CBC/CMP reviewed    Assessment/Plan: ED.  Unsuccessful treatment thus far with Trimix although this is a fairly low dose  I will increase his dose to PGE 30/PAP 30/PHE2-inject 0.5 to 1 mL as needed  Also recommended that he use a constriction band at the base of his penis  Default appointment 3 to 4 months.  If he is not doing well with this maximum dose, I think it is worthwhile to consider IPP.  He was given information on this.    Garnette HERO Tyla Burgner 12/01/2023

## 2023-12-02 ENCOUNTER — Ambulatory Visit (INDEPENDENT_AMBULATORY_CARE_PROVIDER_SITE_OTHER): Admitting: Urology

## 2023-12-02 VITALS — BP 93/63 | HR 98

## 2023-12-02 DIAGNOSIS — E291 Testicular hypofunction: Secondary | ICD-10-CM

## 2023-12-02 DIAGNOSIS — N529 Male erectile dysfunction, unspecified: Secondary | ICD-10-CM

## 2023-12-02 DIAGNOSIS — N5201 Erectile dysfunction due to arterial insufficiency: Secondary | ICD-10-CM

## 2023-12-02 MED ORDER — AMBULATORY NON FORMULARY MEDICATION
11 refills | Status: AC
Start: 2023-12-02 — End: ?

## 2023-12-02 NOTE — Addendum Note (Signed)
 Addended by: SAMMIE EXIE HERO on: 12/02/2023 11:48 AM   Modules accepted: Orders

## 2023-12-04 ENCOUNTER — Other Ambulatory Visit (HOSPITAL_COMMUNITY): Payer: Self-pay

## 2023-12-04 ENCOUNTER — Ambulatory Visit (INDEPENDENT_AMBULATORY_CARE_PROVIDER_SITE_OTHER): Admitting: Internal Medicine

## 2023-12-04 ENCOUNTER — Encounter: Payer: Self-pay | Admitting: Internal Medicine

## 2023-12-04 VITALS — BP 124/80 | HR 86 | Ht 74.0 in | Wt 268.0 lb

## 2023-12-04 DIAGNOSIS — E1142 Type 2 diabetes mellitus with diabetic polyneuropathy: Secondary | ICD-10-CM

## 2023-12-04 DIAGNOSIS — E1159 Type 2 diabetes mellitus with other circulatory complications: Secondary | ICD-10-CM | POA: Diagnosis not present

## 2023-12-04 DIAGNOSIS — E1165 Type 2 diabetes mellitus with hyperglycemia: Secondary | ICD-10-CM

## 2023-12-04 DIAGNOSIS — Z794 Long term (current) use of insulin: Secondary | ICD-10-CM | POA: Diagnosis not present

## 2023-12-04 LAB — POCT GLUCOSE (DEVICE FOR HOME USE): Glucose Fasting, POC: 323 mg/dL — AB (ref 70–99)

## 2023-12-04 LAB — POCT GLYCOSYLATED HEMOGLOBIN (HGB A1C): Hemoglobin A1C: 11 % — AB (ref 4.0–5.6)

## 2023-12-04 MED ORDER — GLIPIZIDE 5 MG PO TABS
5.0000 mg | ORAL_TABLET | Freq: Two times a day (BID) | ORAL | 3 refills | Status: AC
  Filled 2023-12-04: qty 60, 30d supply, fill #0
  Filled 2024-01-03: qty 60, 30d supply, fill #1
  Filled 2024-01-30 – 2024-02-17 (×2): qty 60, 30d supply, fill #2
  Filled 2024-03-02 – 2024-03-19 (×2): qty 60, 30d supply, fill #3
  Filled 2024-04-30: qty 180, 90d supply, fill #4
  Filled 2024-05-06 (×2): qty 60, 30d supply, fill #4

## 2023-12-04 NOTE — Progress Notes (Signed)
 Name: Micheal Neal  Age/ Sex: 53 y.o., male   MRN/ DOB: [id number], Jan 10, 1971     PCP: Elnor Lauraine BRAVO, NP   Reason for Endocrinology Evaluation: Type 2 Diabetes Mellitus  Initial Endocrine Consultative Visit: 05/31/2022    PATIENT IDENTIFIER: Micheal Neal is a 53 y.o. male with a past medical history of CAD (s/p PCI 05/2022) , DM, orthostatic hypotension. The patient has followed with Endocrinology clinic since 05/31/2022 for consultative assistance with management of his diabetes.  DIABETIC HISTORY:  Micheal Neal was diagnosed with DM 2012, he has been on metformin  in the past but developed GI upset , he has been on insulin  since his diagnosis . His hemoglobin A1c has ranged from 8.7% in 2019, peaking at 12.5% in 2024.   GAD-65 < 1 in 2012  He was always on basal/prandial insulin  but due to cost issues he was switched to insulin  mix during hospitalization for MI in 05/2022 Glipizide  was started during hospitalization 05/2022  Mother with CHF    On his initial visit to our clinic 05/2022 I started him on Jardiance   His insurance did not cover Ozempic , started Trulicity  09/2022, but had to discontinue in 2025 due to a high deductible and a cost of $800 for the Trulicity    Jardiance  was taken off by PCP's office  due to dizziness but this didn't  improve his dizziness 2024   SUBJECTIVE:     Today (12/04/2023): Micheal Neal is here for diabetes management.  He has not been using the Dexcom due to high co-pay.  He is not checking glucose at home .   Patient under the care of cardiology for CAD as well as orthostatic hypotension, he is on midodrine    Patient has been evaluated by urology for erectile dysfunction, and hypogonadism.  Testosterone  has not improved his symptoms, he was prescribed Trimix   Denies nausea or vomiting  Denies constipation or diarrhea    HOME DIABETES REGIMEN:  Novolin  mix 16 units BID- 20 units twice daily     Statin: Yes ACE-I/ARB:  no   CONTINUOUS GLUCOSE MONITORING RECORD INTERPRETATION ; n/a   DIABETIC COMPLICATIONS: Microvascular complications:  Neuropathy  Denies: CKD Last Eye Exam: n/a  Macrovascular complications:  CAD Denies:  CVA, PVD   HISTORY:  Past Medical History:  Past Medical History:  Diagnosis Date   Abnormal EKG    AKI (acute kidney injury) (HCC) 05/21/2022   Anemia 07/11/2022   CAD (coronary artery disease)    Contusion of left foot    Diabetes mellitus without complication (HCC)    Dizziness    Encounter for hepatitis C screening test for low risk patient 07/11/2022   Gastrointestinal hemorrhage 07/13/2022   GERD (gastroesophageal reflux disease)    History of myocardial infarction within last 3 months 07/13/2022   Near syncope    Non-STEMI (non-ST elevated myocardial infarction) (HCC) 05/19/2022   Palpitations    Syncope and collapse 05/19/2022   Vertigo    Past Surgical History:  Past Surgical History:  Procedure Laterality Date   BIOPSY  07/13/2022   Procedure: BIOPSY;  Surgeon: Stacia Glendia BRAVO, MD;  Location: [facility] ENDOSCOPY;  Service: Gastroenterology;;   BIOPSY  07/15/2022   Procedure: BIOPSY;  Surgeon: Albertus Gordy HERO, MD;  Location: [facility] ENDOSCOPY;  Service: Gastroenterology;;   COLONOSCOPY N/A 07/15/2022   Procedure: COLONOSCOPY;  Surgeon: Albertus Gordy HERO, MD;  Location: [facility] ENDOSCOPY;  Service: Gastroenterology;  Laterality: N/A;   CORONARY STENT INTERVENTION N/A 05/20/2022   Procedure:  CORONARY STENT INTERVENTION;  Surgeon: Court Dorn PARAS, MD;  Location: [facility] INVASIVE CV LAB;  Service: Cardiovascular;  Laterality: N/A;   ESOPHAGOGASTRODUODENOSCOPY N/A 07/13/2022   Procedure: ESOPHAGOGASTRODUODENOSCOPY (EGD);  Surgeon: Stacia Glendia BRAVO, MD;  Location: [facility] ENDOSCOPY;  Service: Gastroenterology;  Laterality: N/A;   LEFT HEART CATH AND CORONARY ANGIOGRAPHY N/A 05/20/2022   Procedure: LEFT HEART CATH AND CORONARY ANGIOGRAPHY;  Surgeon: Court Dorn PARAS, MD;  Location: [facility]  INVASIVE CV LAB;  Service: Cardiovascular;  Laterality: N/A;   NO PAST SURGERIES     Social History:  reports that he has never smoked. He has never used smokeless tobacco. He reports that he does not drink alcohol and does not use drugs. Family History:  Family History  Problem Relation Age of Onset   Heart disease Mother    Breast cancer Maternal Grandmother        diagnosed late 67's   Breast cancer Maternal Aunt      HOME MEDICATIONS: Allergies as of 12/04/2023   No Known Allergies      Medication List        Accurate as of December 04, 2023  8:42 AM. If you have any questions, ask your nurse or doctor.          AMBULATORY NON FORMULARY MEDICATION Medication Name: Trimix-PGE 30/PAP 30/PHE2-- inject 0.5 to 1 mL as needed dispense 5 1 mL vials, 11 refills   Aspirin  Low Dose 81 MG tablet Generic drug: aspirin  EC Take 1 tablet (81 mg total) by mouth daily. Swallow whole.   carvedilol  3.125 MG tablet Commonly known as: COREG  Take 1 tablet (3.125 mg total) by mouth 2 (two) times daily with a meal.   clopidogrel  75 MG tablet Commonly known as: PLAVIX  Take 1 tablet (75 mg total) by mouth daily.   midodrine  10 MG tablet Commonly known as: PROAMATINE  Take 1 tablet (10 mg total) by mouth 3 (three) times daily.   NONFORMULARY OR COMPOUNDED ITEM Prostin 10 mcg/ml + Papaverine 30 mg/ml + Phentolamine 1.0 mg/ml. Dispense 5 ml. Instructions: Inject 0.5 -1 ml into penis prior to sex as directed. No more than once every other day.   NovoLIN  70/30 Kwikpen (70-30) 100 UNIT/ML KwikPen Generic drug: insulin  isophane & regular human KwikPen Inject 16 Units into the skin 2 (two) times daily.   omeprazole 20 MG capsule Commonly known as: PRILOSEC Take 20 mg by mouth daily.   TRUEplus Pen Needles 31G X 5 MM Misc Generic drug: Insulin  Pen Needle USE AS DIRECTED FOUR TIMES DAILY         OBJECTIVE:   Vital Signs: BP 124/80 (BP Location: Left Arm, Patient Position:  Sitting, Cuff Size: Normal)   Pulse 86   Ht 6' 2 (1.88 m)   Wt 268 lb (121.6 kg)   SpO2 99%   BMI 34.41 kg/m   Wt Readings from Last 3 Encounters:  12/04/23 268 lb (121.6 kg)  09/11/23 265 lb (120.2 kg)  06/03/23 267 lb (121.1 kg)     Exam: General: Pt appears well and is in NAD  Lungs: Clear with good BS bilat   Heart: RRR   Abdomen:  soft, nontender  Extremities: No pretibial edema.   Neuro: MS is good with appropriate affect, pt is alert and Ox3    DM foot exam: 12/04/2023  The skin of the feet is without sores or ulcerations. The pedal pulses are 1+ on right and 1+ on left. The sensation is absent to a screening 5.07, 10  gram monofilament bilaterally        DATA REVIEWED:  Lab Results  Component Value Date   HGBA1C 11.0 (A) 12/04/2023   HGBA1C 6.9 (A) 06/03/2023   HGBA1C 7.9 (A) 02/06/2023     Latest Reference Range & Units 04/21/23 13:53  Sodium 135 - 145 mEq/L 137  Potassium 3.5 - 5.1 mEq/L 4.3  Chloride 96 - 112 mEq/L 101  CO2 19 - 32 mEq/L 28  Glucose 70 - 99 mg/dL 868 (H)  BUN 6 - 23 mg/dL 20  Creatinine 9.59 - 8.49 mg/dL 8.75  Calcium  8.4 - 10.5 mg/dL 9.7  Alkaline Phosphatase 39 - 117 U/L 92  Albumin 3.5 - 5.2 g/dL 4.8  AST 0 - 37 U/L 24  ALT 0 - 53 U/L 31  Total Protein 6.0 - 8.3 g/dL 8.4 (H)  Total Bilirubin 0.2 - 1.2 mg/dL 1.3 (H)  GFR >39.99 mL/min 67.69    Latest Reference Range & Units 03/13/23 10:30  Creatinine,U mg/dL 868.1  Microalb, Ur 0.0 - 1.9 mg/dL 1.4  MICROALB/CREAT RATIO 0.0 - 30.0 mg/g 1.0    Latest Reference Range & Units 03/13/23 10:30  Total CHOL/HDL Ratio  4  Cholesterol 0 - 200 mg/dL 854  HDL Cholesterol >60.99 mg/dL 58.79  LDL (calc) 0 - 99 mg/dL 80  MICROALB/CREAT RATIO 0.0 - 30.0 mg/g 1.0  NonHDL  104.10  Triglycerides 0.0 - 149.0 mg/dL 876.9  VLDL 0.0 - 59.9 mg/dL 75.3   In office BG 676 mg/DL  ASSESSMENT / PLAN / RECOMMENDATIONS:   1) Type 2 Diabetes Mellitus, poorly controlled, With Neuropathic and   macrovascular complications - Most recent A1c of 11.0 %. Goal A1c < 7.0 %.     -A1c trended up from 6.9% to 11% -Intolerant to metformin  -Jardiance  was discontinued by his PCPs office due to dizziness in 2024, but the patient did not notice any improvement in his dizziness - GLP-1 agonist are cost prohibitive due to a high deductible plan - Unable to use CGM due to to high co-pay - Patient was social determinants - I have encouraged the patient to use regular glucose meter for glucose checks - Patient would like to go back on glipizide  as he has noted improved glycemic control, I did caution the patient against hypoglycemia especially with the combinations of sulfonylurea and insulin .  I have again encouraged the patient to check glucose twice daily at least for the next week and contact our office with hypoglycemia or persistent hyperglycemia - I will not increase his insulin  dose at this time since I am starting him on glipizide    MEDICATIONS:  Start glipizide  5 mg, 1 tablet twice daily Continue Novolin  mix to 20 units before breakfast and 20 units before supper  EDUCATION / INSTRUCTIONS: BG monitoring instructions: Patient is instructed to check his blood sugars 3 times a day. Call [place] [facility] if: BG persistently < 70  I reviewed the Rule of 15 for the treatment of hypoglycemia in detail with the patient. Literature supplied.    2) Diabetic complications:  Eye: Does not have known diabetic retinopathy.  Neuro/ Feet: Does  have known diabetic peripheral neuropathy .  Renal: Patient does not have known baseline CKD. He   is not on an ACEI/ARB at present.      F/U in 3 months     Signed electronically by: Stefano Redgie Butts, MD  [facility] Endocrinology  [facility] [address] [place], [place] [address] Phone: [phone]  FAX: [phone]   CC: Elnor Lauraine BRAVO, NP [address] [place] [address] Phone:  [phone]  Fax: [phone]  Return to Endocrinology clinic as below: Future Appointments  Date Time Provider Department Center  12/10/2023  9:00 AM Verlin Lonni BIRCH, MD [facility]  04/13/2024  3:30 PM Matilda Senior, MD [facility] None

## 2023-12-04 NOTE — Patient Instructions (Signed)
 Start glipizide  5 mg, 1 tablet before breakfast and 1 tablet before supper Continue Novolog  Mix to 20 units before Breakfast and 20 units Before Supper     HOW TO TREAT LOW BLOOD SUGARS (Blood sugar LESS THAN 70 MG/DL) Please follow the RULE OF 15 for the treatment of hypoglycemia treatment (when your (blood sugars are less than 70 mg/dL)   STEP 1: Take 15 grams of carbohydrates when your blood sugar is low, which includes:  3-4 GLUCOSE TABS  OR 3-4 OZ OF JUICE OR REGULAR SODA OR ONE TUBE OF GLUCOSE GEL    STEP 2: RECHECK blood sugar in 15 MINUTES STEP 3: If your blood sugar is still low at the 15 minute recheck --> then, go back to STEP 1 and treat AGAIN with another 15 grams of carbohydrates.

## 2023-12-09 NOTE — Progress Notes (Unsigned)
 No chief complaint on file.  History of Present Illness: 53 yo male with history of CAD, SVT, diabetes, hyperlipidemia, vertigo and orthostatic hypotension who is here today for follow up. I saw him as a new patient in October 2023 for the evaluation of near syncope. He was seen in the ED at [facility] in August 2023 with near syncope.  EKG without ischemic changes. BP was initially in the 70s systolic. Troponin negative. He was orthostatic in the ED and was given IV fluids. Nuclear stress test  11/27/21 with no ischemia. Normal LV function. Echo November 2023 with LVEF=60-65%. No valve disease. Cardiac monitor in November 2023 with sinus and 6 short runs of SVT.  He was seen in the ED on 05/19/2022 with complaint of 2 syncopal events.  Patient's blood glucose was elevated to 528. His orthostatic vitals were positive. EKG showed possible ST elevations inferiorly. Troponin up to 200s. Cardiac cath with severe posterolateral artery stenosis treated with a drug eluting stent. There was also a severe stenosis in the small caliber AV groove Circumflex that was felt to be too small for PCI. Moderate LAD and right PDA stenosis not felt to be flow limiting. He discharged on Brilinta  and ASA. He presented to the ED at [facility] on 05/27/2022 with complaint of weakness with shortness of breath and sweatiness.  Cardiology was consulted and EKG showed sinus rhythm with T wave inversion inferolaterally. Blood glucose was 70 and troponins were 190, 186.  He denied chest pain.   He is here today for follow up. The patient denies any chest pain, dyspnea, palpitations, lower extremity edema, orthopnea, PND, dizziness, near syncope or syncope.   Primary Care Physician: Elnor Lauraine BRAVO, NP   Past Medical History:  Diagnosis Date   Abnormal EKG    AKI (acute kidney injury) (HCC) 05/21/2022   Anemia 07/11/2022   CAD (coronary artery disease)    Contusion of left foot    Diabetes mellitus without complication  (HCC)    Dizziness    Encounter for hepatitis C screening test for low risk patient 07/11/2022   Gastrointestinal hemorrhage 07/13/2022   GERD (gastroesophageal reflux disease)    History of myocardial infarction within last 3 months 07/13/2022   Near syncope    Non-STEMI (non-ST elevated myocardial infarction) (HCC) 05/19/2022   Palpitations    Syncope and collapse 05/19/2022   Vertigo     Past Surgical History:  Procedure Laterality Date   BIOPSY  07/13/2022   Procedure: BIOPSY;  Surgeon: Stacia Glendia BRAVO, MD;  Location: [facility] ENDOSCOPY;  Service: Gastroenterology;;   BIOPSY  07/15/2022   Procedure: BIOPSY;  Surgeon: Albertus Gordy HERO, MD;  Location: [facility] ENDOSCOPY;  Service: Gastroenterology;;   COLONOSCOPY N/A 07/15/2022   Procedure: COLONOSCOPY;  Surgeon: Albertus Gordy HERO, MD;  Location: [facility] ENDOSCOPY;  Service: Gastroenterology;  Laterality: N/A;   CORONARY STENT INTERVENTION N/A 05/20/2022   Procedure: CORONARY STENT INTERVENTION;  Surgeon: Court Dorn PARAS, MD;  Location: [facility] INVASIVE CV LAB;  Service: Cardiovascular;  Laterality: N/A;   ESOPHAGOGASTRODUODENOSCOPY N/A 07/13/2022   Procedure: ESOPHAGOGASTRODUODENOSCOPY (EGD);  Surgeon: Stacia Glendia BRAVO, MD;  Location: [facility] ENDOSCOPY;  Service: Gastroenterology;  Laterality: N/A;   LEFT HEART CATH AND CORONARY ANGIOGRAPHY N/A 05/20/2022   Procedure: LEFT HEART CATH AND CORONARY ANGIOGRAPHY;  Surgeon: Court Dorn PARAS, MD;  Location: [facility] INVASIVE CV LAB;  Service: Cardiovascular;  Laterality: N/A;   NO PAST SURGERIES     No surgeries  Current Outpatient  Medications  Medication Sig Dispense Refill   AMBULATORY NON FORMULARY MEDICATION Medication Name: Trimix-PGE 30/PAP 30/PHE2-- inject 0.5 to 1 mL as needed dispense 5 1 mL vials, 11 refills 5 mL 11   aspirin  EC (ASPIRIN  LOW DOSE) 81 MG tablet Take 1 tablet (81 mg total) by mouth daily. Swallow whole. 90 tablet 1   carvedilol  (COREG ) 3.125 MG tablet Take 1 tablet (3.125 mg total) by mouth 2  (two) times daily with a meal. 180 tablet 1   clopidogrel  (PLAVIX ) 75 MG tablet Take 1 tablet (75 mg total) by mouth daily. **Please keep September appointment for future refills.** 30 tablet 0   glipiZIDE  (GLUCOTROL ) 5 MG tablet Take 1 tablet (5 mg total) by mouth 2 (two) times daily before a meal. 180 tablet 3   insulin  isophane & regular human KwikPen (NOVOLIN  70/30 KWIKPEN) (70-30) 100 UNIT/ML KwikPen Inject 16 Units into the skin 2 (two) times daily. 30 mL 3   Insulin  Pen Needle (TRUEPLUS PEN NEEDLES) 31G X 5 MM MISC USE AS DIRECTED FOUR TIMES DAILY 200 each 3   midodrine  (PROAMATINE ) 10 MG tablet Take 1 tablet (10 mg total) by mouth 3 (three) times daily. 270 tablet 3   NONFORMULARY OR COMPOUNDED ITEM Prostin 10 mcg/ml + Papaverine 30 mg/ml + Phentolamine 1.0 mg/ml. Dispense 5 ml. Instructions: Inject 0.5 -1 ml into penis prior to sex as directed. No more than once every other day. 5 each 11   omeprazole (PRILOSEC) 20 MG capsule Take 20 mg by mouth daily.     No current facility-administered medications for this visit.    No Known Allergies  Social History   Socioeconomic History   Marital status: Married    Spouse name: Not on file   Number of children: 4   Years of education: Not on file   Highest education level: 12th grade  Occupational History   Occupation: Maintenance Tech  Tobacco Use   Smoking status: Never   Smokeless tobacco: Never  Vaping Use   Vaping status: Never Used  Substance and Sexual Activity   Alcohol use: No   Drug use: No   Sexual activity: Yes    Partners: Female    Birth control/protection: None  Other Topics Concern   Not on file  Social History Narrative   Not on file   Social Drivers of Health   Financial Resource Strain: Medium Risk (09/11/2023)   Overall Financial Resource Strain (CARDIA)    Difficulty of Paying Living Expenses: Somewhat hard  Food Insecurity: Patient Declined (09/11/2023)   Hunger Vital Sign    Worried About Running Out  of Food in the Last Year: Patient declined    Ran Out of Food in the Last Year: Patient declined  Transportation Needs: Unknown (09/11/2023)   PRAPARE - Transportation    Lack of Transportation (Medical): No    Lack of Transportation (Non-Medical): Patient declined  Physical Activity: Sufficiently Active (09/11/2023)   Exercise Vital Sign    Days of Exercise per Week: 4 days    Minutes of Exercise per Session: 60 min  Stress: No Stress Concern Present (09/11/2023)   [employer] of Occupational Health - Occupational Stress Questionnaire    Feeling of Stress: Only a little  Social Connections: Moderately Integrated (09/11/2023)   Social Connection and Isolation Panel    Frequency of Communication with Friends and Family: More than three times a week    Frequency of Social Gatherings with Friends and Family: Once a week  Attends Religious Services: Patient declined    Active Member of Clubs or Organizations: Yes    Attends Banker Meetings: 1 to 4 times per year    Marital Status: Married  Catering manager Violence: Not At Risk (07/12/2022)   Humiliation, Afraid, Rape, and Kick questionnaire    Fear of Current or Ex-Partner: No    Emotionally Abused: No    Physically Abused: No    Sexually Abused: No    Family History  Problem Relation Age of Onset   Heart disease Mother    Breast cancer Maternal Grandmother        diagnosed late 50's   Breast cancer Maternal Aunt     Review of Systems:  As stated in the HPI and otherwise negative.   There were no vitals taken for this visit.  Physical Examination: General: Well developed, well nourished, NAD  HEENT: OP clear, mucus membranes moist  SKIN: warm, dry. No rashes. Neuro: No focal deficits  Musculoskeletal: Muscle strength 5/5 all ext  Psychiatric: Mood and affect normal  Neck: No JVD, no carotid bruits, no thyromegaly, no lymphadenopathy.  Lungs:Clear bilaterally, no wheezes, rhonci, crackles Cardiovascular:  Regular rate and rhythm. No murmurs, gallops or rubs. Abdomen:Soft. Bowel sounds present. Non-tender.  Extremities: No lower extremity edema. Pulses are 2 + in the bilateral DP/PT.,  EKG:  EKG is *** ordered today. The ekg ordered today demonstrates  Recent Labs: 03/13/2023: TSH 2.22 04/21/2023: ALT 31; BUN 20; Creatinine, Ser 1.24; Platelets 248.0; Potassium 4.3; Sodium 137 08/06/2023: Hemoglobin 13.7   Lipid Panel    Component Value Date/Time   CHOL 145 03/13/2023 1030   CHOL 145 02/26/2022 0717   TRIG 123.0 03/13/2023 1030   HDL 41.20 03/13/2023 1030   HDL 31 (L) 02/26/2022 0717   CHOLHDL 4 03/13/2023 1030   VLDL 24.6 03/13/2023 1030   LDLCALC 80 03/13/2023 1030   LDLCALC 72 02/26/2022 0717   LDLDIRECT 121 (H) 05/21/2022 0236     Wt Readings from Last 3 Encounters:  12/04/23 268 lb (121.6 kg)  09/11/23 265 lb (120.2 kg)  06/03/23 267 lb (121.1 kg)    Assessment and Plan:   CAD without angina: No chest pain. Will continue ASA, Plavix , statin and beta blocker. He was unable to get insurance coverage for Ranexa .   Orthostatic hypotension: No syncope. Some dizziness. He is instructed to push his po fluid intake, be liberal with salt intake, continue abdominal binder and compression stockings. Continue midodrine   Labs/ tests ordered today include:  No orders of the defined types were placed in this encounter.  Disposition:   F/U with me or office APP in 12 months  Signed, Lonni Cash, MD, [facility] 12/09/2023 2:27 PM    [facility] [address] [place], [place], [place]  [address] Phone: [phone]; Fax: [phone]

## 2023-12-10 ENCOUNTER — Ambulatory Visit: Attending: Cardiovascular Disease | Admitting: Cardiovascular Disease

## 2023-12-10 ENCOUNTER — Encounter: Payer: Self-pay | Admitting: Cardiovascular Disease

## 2023-12-10 ENCOUNTER — Other Ambulatory Visit (HOSPITAL_COMMUNITY): Payer: Self-pay

## 2023-12-10 VITALS — BP 120/70 | HR 89 | Ht 74.0 in | Wt 270.8 lb

## 2023-12-10 DIAGNOSIS — E7800 Pure hypercholesterolemia, unspecified: Secondary | ICD-10-CM

## 2023-12-10 DIAGNOSIS — Z79899 Other long term (current) drug therapy: Secondary | ICD-10-CM

## 2023-12-10 DIAGNOSIS — I2510 Atherosclerotic heart disease of native coronary artery without angina pectoris: Secondary | ICD-10-CM

## 2023-12-10 DIAGNOSIS — I951 Orthostatic hypotension: Secondary | ICD-10-CM

## 2023-12-10 MED ORDER — ATORVASTATIN CALCIUM 80 MG PO TABS
80.0000 mg | ORAL_TABLET | Freq: Every day | ORAL | 3 refills | Status: AC
  Filled 2023-12-10: qty 30, 30d supply, fill #0
  Filled 2023-12-23: qty 90, 90d supply, fill #0
  Filled 2023-12-23: qty 30, 30d supply, fill #0
  Filled 2024-01-19: qty 30, 30d supply, fill #1
  Filled 2024-02-19 – 2024-03-02 (×2): qty 30, 30d supply, fill #2
  Filled 2024-03-27: qty 30, 30d supply, fill #3
  Filled 2024-04-30: qty 90, 90d supply, fill #4
  Filled 2024-05-06 (×2): qty 30, 30d supply, fill #4

## 2023-12-10 NOTE — Patient Instructions (Signed)
 Medication Instructions:  Your physician recommends that you continue on your current medications as directed. Please refer to the Current Medication list given to you today.  *If you need a refill on your cardiac medications before your next appointment, please call your pharmacy*  Lab Work: Please complete a FASTING lipid panel and an ALT in 12 weeks at any [facility].  If you have labs (blood work) drawn today and your tests are completely normal, you will receive your results only by: MyChart Message (if you have MyChart) OR A paper copy in the mail If you have any lab test that is abnormal or we need to change your treatment, we will call you to review the results.  Testing/Procedures: None.  Follow-Up: At [facility], you and your health needs are our priority.  As part of our continuing mission to provide you with exceptional heart care, our providers are all part of one team.  This team includes your primary Cardiologist (physician) and Advanced Practice Providers or APPs (Physician Assistants and Nurse Practitioners) who all work together to provide you with the care you need, when you need it.  Your next appointment:   1 year(s)  Provider:   Lonni Cash, MD    We recommend signing up for the patient portal called MyChart.  Sign up information is provided on this After Visit Summary.  MyChart is used to connect with patients for Virtual Visits (Telemedicine).  Patients are able to view lab/test results, encounter notes, upcoming appointments, etc.  Non-urgent messages can be sent to your provider as well.   To learn more about what you can do with MyChart, go to [email or web address].

## 2023-12-23 ENCOUNTER — Other Ambulatory Visit (HOSPITAL_COMMUNITY): Payer: Self-pay

## 2023-12-23 ENCOUNTER — Other Ambulatory Visit: Payer: Self-pay | Admitting: Cardiovascular Disease

## 2023-12-23 ENCOUNTER — Other Ambulatory Visit: Payer: Self-pay

## 2023-12-24 ENCOUNTER — Other Ambulatory Visit (HOSPITAL_COMMUNITY): Payer: Self-pay

## 2023-12-24 MED ORDER — MIDODRINE HCL 10 MG PO TABS
10.0000 mg | ORAL_TABLET | Freq: Three times a day (TID) | ORAL | 3 refills | Status: AC
  Filled 2023-12-24: qty 90, 30d supply, fill #0
  Filled 2024-01-07: qty 270, 90d supply, fill #0
  Filled 2024-01-14 – 2024-01-29 (×3): qty 90, 30d supply, fill #0
  Filled 2024-02-19 – 2024-03-19 (×3): qty 90, 30d supply, fill #1
  Filled 2024-04-30: qty 270, 90d supply, fill #2
  Filled ????-??-??: fill #1

## 2024-01-03 ENCOUNTER — Other Ambulatory Visit: Payer: Self-pay | Admitting: Cardiovascular Disease

## 2024-01-05 ENCOUNTER — Other Ambulatory Visit: Payer: Self-pay

## 2024-01-05 ENCOUNTER — Other Ambulatory Visit (HOSPITAL_COMMUNITY): Payer: Self-pay

## 2024-01-05 MED ORDER — CLOPIDOGREL BISULFATE 75 MG PO TABS
75.0000 mg | ORAL_TABLET | Freq: Every day | ORAL | 3 refills | Status: AC
  Filled 2024-01-05: qty 30, 30d supply, fill #0
  Filled 2024-01-07 – 2024-02-17 (×4): qty 30, 30d supply, fill #1
  Filled 2024-03-02 – 2024-03-19 (×2): qty 30, 30d supply, fill #2
  Filled 2024-04-30: qty 90, 90d supply, fill #3
  Filled 2024-05-06 (×2): qty 30, 30d supply, fill #3

## 2024-01-07 ENCOUNTER — Other Ambulatory Visit (HOSPITAL_COMMUNITY): Payer: Self-pay

## 2024-01-08 ENCOUNTER — Other Ambulatory Visit: Payer: Self-pay

## 2024-01-15 ENCOUNTER — Other Ambulatory Visit (HOSPITAL_COMMUNITY): Payer: Self-pay

## 2024-01-15 ENCOUNTER — Other Ambulatory Visit: Payer: Self-pay

## 2024-01-20 ENCOUNTER — Other Ambulatory Visit (HOSPITAL_COMMUNITY): Payer: Self-pay

## 2024-01-20 ENCOUNTER — Other Ambulatory Visit: Payer: Self-pay

## 2024-01-25 ENCOUNTER — Other Ambulatory Visit (HOSPITAL_COMMUNITY): Payer: Self-pay

## 2024-01-29 ENCOUNTER — Other Ambulatory Visit (HOSPITAL_COMMUNITY): Payer: Self-pay

## 2024-02-08 ENCOUNTER — Other Ambulatory Visit (HOSPITAL_COMMUNITY): Payer: Self-pay

## 2024-02-19 ENCOUNTER — Other Ambulatory Visit: Payer: Self-pay | Admitting: Nurse Practitioner

## 2024-02-19 ENCOUNTER — Other Ambulatory Visit: Payer: Self-pay

## 2024-02-19 ENCOUNTER — Other Ambulatory Visit (HOSPITAL_COMMUNITY): Payer: Self-pay

## 2024-02-19 DIAGNOSIS — I2510 Atherosclerotic heart disease of native coronary artery without angina pectoris: Secondary | ICD-10-CM

## 2024-02-19 MED ORDER — CARVEDILOL 3.125 MG PO TABS
3.1250 mg | ORAL_TABLET | Freq: Two times a day (BID) | ORAL | 1 refills | Status: DC
  Filled 2024-02-19 – 2024-03-02 (×2): qty 60, 30d supply, fill #0
  Filled 2024-03-27: qty 60, 30d supply, fill #1

## 2024-02-29 ENCOUNTER — Other Ambulatory Visit (HOSPITAL_COMMUNITY): Payer: Self-pay

## 2024-03-01 DIAGNOSIS — Z79899 Other long term (current) drug therapy: Secondary | ICD-10-CM | POA: Diagnosis not present

## 2024-03-01 DIAGNOSIS — I2510 Atherosclerotic heart disease of native coronary artery without angina pectoris: Secondary | ICD-10-CM | POA: Diagnosis not present

## 2024-03-01 DIAGNOSIS — E7800 Pure hypercholesterolemia, unspecified: Secondary | ICD-10-CM | POA: Diagnosis not present

## 2024-03-01 LAB — LIPID PANEL
Chol/HDL Ratio: 5.7 ratio — ABNORMAL HIGH (ref 0.0–5.0)
Cholesterol, Total: 170 mg/dL (ref 100–199)
HDL: 30 mg/dL — ABNORMAL LOW (ref >39)
LDL Chol Calc (NIH): 97 mg/dL (ref 0–99)
Triglycerides: 249 mg/dL — ABNORMAL HIGH (ref 0–149)
VLDL Cholesterol Cal: 43 mg/dL — ABNORMAL HIGH (ref 5–40)

## 2024-03-01 LAB — ALT: ALT: 20 IU/L (ref 0–44)

## 2024-03-02 ENCOUNTER — Other Ambulatory Visit (HOSPITAL_COMMUNITY): Payer: Self-pay

## 2024-03-02 ENCOUNTER — Other Ambulatory Visit: Payer: Self-pay | Admitting: *Deleted

## 2024-03-02 ENCOUNTER — Ambulatory Visit: Payer: Self-pay | Admitting: Cardiovascular Disease

## 2024-03-02 ENCOUNTER — Encounter: Payer: Self-pay | Admitting: Oncology

## 2024-03-02 ENCOUNTER — Other Ambulatory Visit: Payer: Self-pay

## 2024-03-02 DIAGNOSIS — E7800 Pure hypercholesterolemia, unspecified: Secondary | ICD-10-CM

## 2024-03-02 DIAGNOSIS — Z79899 Other long term (current) drug therapy: Secondary | ICD-10-CM

## 2024-03-02 MED ORDER — EZETIMIBE 10 MG PO TABS
10.0000 mg | ORAL_TABLET | Freq: Every day | ORAL | 3 refills | Status: AC
  Filled 2024-03-02: qty 30, 30d supply, fill #0
  Filled 2024-03-27: qty 30, 30d supply, fill #1
  Filled 2024-04-30: qty 90, 90d supply, fill #2
  Filled 2024-05-06 (×2): qty 30, 30d supply, fill #2

## 2024-03-10 ENCOUNTER — Ambulatory Visit: Admitting: Internal Medicine

## 2024-03-16 ENCOUNTER — Other Ambulatory Visit (HOSPITAL_COMMUNITY): Payer: Self-pay

## 2024-03-24 ENCOUNTER — Other Ambulatory Visit (HOSPITAL_COMMUNITY): Payer: Self-pay

## 2024-03-29 ENCOUNTER — Encounter: Payer: Self-pay | Admitting: *Deleted

## 2024-04-02 ENCOUNTER — Other Ambulatory Visit: Payer: Self-pay

## 2024-04-02 ENCOUNTER — Other Ambulatory Visit (HOSPITAL_COMMUNITY): Payer: Self-pay

## 2024-04-02 ENCOUNTER — Ambulatory Visit: Admitting: Internal Medicine

## 2024-04-02 ENCOUNTER — Other Ambulatory Visit

## 2024-04-02 ENCOUNTER — Encounter: Payer: Self-pay | Admitting: Internal Medicine

## 2024-04-02 ENCOUNTER — Telehealth (HOSPITAL_COMMUNITY): Payer: Self-pay

## 2024-04-02 ENCOUNTER — Encounter: Payer: Self-pay | Admitting: Oncology

## 2024-04-02 ENCOUNTER — Encounter: Payer: Self-pay | Admitting: Pharmacist

## 2024-04-02 VITALS — BP 138/84 | Ht 74.0 in | Wt 275.0 lb

## 2024-04-02 DIAGNOSIS — Z794 Long term (current) use of insulin: Secondary | ICD-10-CM | POA: Diagnosis not present

## 2024-04-02 DIAGNOSIS — Z7984 Long term (current) use of oral hypoglycemic drugs: Secondary | ICD-10-CM | POA: Diagnosis not present

## 2024-04-02 DIAGNOSIS — Z7985 Long-term (current) use of injectable non-insulin antidiabetic drugs: Secondary | ICD-10-CM

## 2024-04-02 DIAGNOSIS — E1165 Type 2 diabetes mellitus with hyperglycemia: Secondary | ICD-10-CM | POA: Diagnosis not present

## 2024-04-02 DIAGNOSIS — E1159 Type 2 diabetes mellitus with other circulatory complications: Secondary | ICD-10-CM

## 2024-04-02 DIAGNOSIS — E1142 Type 2 diabetes mellitus with diabetic polyneuropathy: Secondary | ICD-10-CM | POA: Diagnosis not present

## 2024-04-02 LAB — POCT GLYCOSYLATED HEMOGLOBIN (HGB A1C): Hemoglobin A1C: 9.8 % — AB (ref 4.0–5.6)

## 2024-04-02 MED ORDER — DEXCOM G7 SENSOR MISC
1.0000 | 3 refills | Status: AC
  Filled 2024-04-02: qty 9, 90d supply, fill #0

## 2024-04-02 MED ORDER — EMPAGLIFLOZIN 10 MG PO TABS
10.0000 mg | ORAL_TABLET | Freq: Every day | ORAL | 3 refills | Status: AC
  Filled 2024-04-02 – 2024-04-30 (×3): qty 90, 90d supply, fill #0

## 2024-04-02 MED ORDER — TIRZEPATIDE 5 MG/0.5ML ~~LOC~~ SOAJ
5.0000 mg | SUBCUTANEOUS | 3 refills | Status: AC
  Filled 2024-04-02: qty 2, 28d supply, fill #0

## 2024-04-02 NOTE — Patient Instructions (Addendum)
 Take glipizide  5 mg, 1 tablet before breakfast and 1 tablet before supper Continue Novolog  Mix to 20 units before Breakfast and 20 units Before Supper Start Mounjaro 2.5 mg once weekly for 4 weeks, then increase to 5 mg weekly Start Jardiance  10 mg daily     HOW TO TREAT LOW BLOOD SUGARS (Blood sugar LESS THAN 70 MG/DL) Please follow the RULE OF 15 for the treatment of hypoglycemia treatment (when your (blood sugars are less than 70 mg/dL)   STEP 1: Take 15 grams of carbohydrates when your blood sugar is low, which includes:  3-4 GLUCOSE TABS  OR 3-4 OZ OF JUICE OR REGULAR SODA OR ONE TUBE OF GLUCOSE GEL    STEP 2: RECHECK blood sugar in 15 MINUTES STEP 3: If your blood sugar is still low at the 15 minute recheck --> then, go back to STEP 1 and treat AGAIN with another 15 grams of carbohydrates.

## 2024-04-02 NOTE — Progress Notes (Signed)
 "   Name: Micheal Neal  Age/ Sex: 54 y.o., male   MRN/ DOB: [id number], [birth year]     PCP: Elnor Lauraine BRAVO, NP   Reason for Endocrinology Evaluation: Type 2 Diabetes Mellitus  Initial Endocrine Consultative Visit: 05/31/2022    PATIENT IDENTIFIER: Micheal Neal is a 54 y.o. male with a past medical history of CAD (s/p PCI 05/2022) , DM, orthostatic hypotension. The patient has followed with Endocrinology clinic since 05/31/2022 for consultative assistance with management of his diabetes.  DIABETIC HISTORY:  Mr. Duva was diagnosed with DM 2012, he has been on metformin  in the past but developed GI upset , he has been on insulin  since his diagnosis . His hemoglobin A1c has ranged from 8.7% in 2019, peaking at 12.5% in 2024.   GAD-65 < 1 in 2012  He was always on basal/prandial insulin  but due to cost issues he was switched to insulin  mix during hospitalization for MI in 05/2022 Glipizide  was started during hospitalization 05/2022  Mother with CHF    On his initial visit to our clinic 05/2022 I started him on Jardiance   His insurance did not cover Ozempic , started Trulicity  09/2022, but had to discontinue in 2025 due to a high deductible and a cost of $800 for the Trulicity    Jardiance  was taken off by PCP's office  due to dizziness but this didn't  improve his dizziness 2024   Started glipizide  September, 2025 with an A1c of 11.0%  SUBJECTIVE:     Today (04/02/2024): Micheal Neal is here for diabetes management.  He checks glucose once daily . No hypoglycemia   Patient under the care of cardiology for CAD,  orthostatic hypotension and dyslipidemia.  He was restarted on Lipitor through cardiology in September, 2025   Patient has been evaluated by urology for erectile dysfunction, and hypogonadism.  Testosterone  has not improved his symptoms, he was prescribed Trimix   No nausea  unless has GERD symptoms  Has occasional consistent but no diarrhea   HOME DIABETES REGIMEN:   Glipizide  5 mg twice daily Novolin  mix 20 units BID    Statin: Yes ACE-I/ARB: no    DIABETIC COMPLICATIONS: Microvascular complications:  Neuropathy  Denies: CKD Last Eye Exam: n/a  Macrovascular complications:  CAD Denies:  CVA, PVD   HISTORY:  Past Medical History:  Past Medical History:  Diagnosis Date   Abnormal EKG    AKI (acute kidney injury) 05/21/2022   Anemia 07/11/2022   CAD (coronary artery disease)    Contusion of left foot    Diabetes mellitus without complication (HCC)    Dizziness    Encounter for hepatitis C screening test for low risk patient 07/11/2022   Gastrointestinal hemorrhage 07/13/2022   GERD (gastroesophageal reflux disease)    History of myocardial infarction within last 3 months 07/13/2022   Near syncope    Non-STEMI (non-ST elevated myocardial infarction) (HCC) 05/19/2022   Palpitations    Syncope and collapse 05/19/2022   Vertigo    Past Surgical History:  Past Surgical History:  Procedure Laterality Date   BIOPSY  07/13/2022   Procedure: BIOPSY;  Surgeon: Stacia Glendia BRAVO, MD;  Location: [facility] ENDOSCOPY;  Service: Gastroenterology;;   BIOPSY  07/15/2022   Procedure: BIOPSY;  Surgeon: Albertus Gordy HERO, MD;  Location: [facility] ENDOSCOPY;  Service: Gastroenterology;;   COLONOSCOPY N/A 07/15/2022   Procedure: COLONOSCOPY;  Surgeon: Albertus Gordy HERO, MD;  Location: [facility] ENDOSCOPY;  Service: Gastroenterology;  Laterality: N/A;   CORONARY STENT INTERVENTION N/A 05/20/2022  Procedure: CORONARY STENT INTERVENTION;  Surgeon: Court Dorn PARAS, MD;  Location: [facility] INVASIVE CV LAB;  Service: Cardiovascular;  Laterality: N/A;   ESOPHAGOGASTRODUODENOSCOPY N/A 07/13/2022   Procedure: ESOPHAGOGASTRODUODENOSCOPY (EGD);  Surgeon: Stacia Glendia BRAVO, MD;  Location: [facility] ENDOSCOPY;  Service: Gastroenterology;  Laterality: N/A;   LEFT HEART CATH AND CORONARY ANGIOGRAPHY N/A 05/20/2022   Procedure: LEFT HEART CATH AND CORONARY ANGIOGRAPHY;  Surgeon: Court Dorn PARAS,  MD;  Location: [facility] INVASIVE CV LAB;  Service: Cardiovascular;  Laterality: N/A;   NO PAST SURGERIES     Social History:  reports that he has never smoked. He has never used smokeless tobacco. He reports that he does not drink alcohol and does not use drugs. Family History:  Family History  Problem Relation Age of Onset   Heart disease Mother    Breast cancer Maternal Grandmother        diagnosed late 25's   Breast cancer Maternal Aunt      HOME MEDICATIONS: Allergies as of 04/02/2024   No Known Allergies      Medication List        Accurate as of April 02, 2024  9:58 AM. If you have any questions, ask your nurse or doctor.          AMBULATORY NON FORMULARY MEDICATION Medication Name: Trimix-PGE 30/PAP 30/PHE2-- inject 0.5 to 1 mL as needed dispense 5 1 mL vials, 11 refills   Aspirin  Low Dose 81 MG tablet Generic drug: aspirin  EC Take 1 tablet (81 mg total) by mouth daily. Swallow whole.   atorvastatin  80 MG tablet Commonly known as: LIPITOR Take 1 tablet (80 mg total) by mouth daily.   carvedilol  3.125 MG tablet Commonly known as: COREG  Take 1 tablet (3.125 mg total) by mouth 2 (two) times daily with a meal.   clopidogrel  75 MG tablet Commonly known as: PLAVIX  Take 1 tablet (75 mg total) by mouth daily.   ezetimibe  10 MG tablet Commonly known as: ZETIA  Take 1 tablet (10 mg total) by mouth daily.   glipiZIDE  5 MG tablet Commonly known as: GLUCOTROL  Take 1 tablet (5 mg total) by mouth 2 (two) times daily before a meal.   midodrine  10 MG tablet Commonly known as: PROAMATINE  Take 1 tablet (10 mg total) by mouth 3 (three) times daily.   NONFORMULARY OR COMPOUNDED ITEM Prostin 10 mcg/ml + Papaverine 30 mg/ml + Phentolamine 1.0 mg/ml. Dispense 5 ml. Instructions: Inject 0.5 -1 ml into penis prior to sex as directed. No more than once every other day.   NovoLIN  70/30 Kwikpen (70-30) 100 UNIT/ML KwikPen Generic drug: insulin  isophane & regular human  KwikPen Inject 16 Units into the skin 2 (two) times daily.   omeprazole 20 MG capsule Commonly known as: PRILOSEC Take 20 mg by mouth daily.   TRUEplus Pen Needles 31G X 5 MM Misc Generic drug: Insulin  Pen Needle USE AS DIRECTED FOUR TIMES DAILY         OBJECTIVE:   Vital Signs: BP 138/84   Ht 6' 2 (1.88 m)   Wt 275 lb (124.7 kg)   BMI 35.31 kg/m   Wt Readings from Last 3 Encounters:  04/02/24 275 lb (124.7 kg)  12/10/23 270 lb 12.8 oz (122.8 kg)  12/04/23 268 lb (121.6 kg)     Exam: General: Pt appears well and is in NAD  Lungs: Clear with good BS bilat   Heart: RRR   Extremities: No pretibial edema.   Neuro: MS is good with appropriate affect,  pt is alert and Ox3    DM foot exam: 12/04/2023  The skin of the feet is without sores or ulcerations. The pedal pulses are 1+ on right and 1+ on left. The sensation is absent to a screening 5.07, 10 gram monofilament bilaterally        DATA REVIEWED:  Lab Results  Component Value Date   HGBA1C 11.0 (A) 12/04/2023   HGBA1C 6.9 (A) 06/03/2023   HGBA1C 7.9 (A) 02/06/2023     Latest Reference Range & Units 04/21/23 13:53  Sodium 135 - 145 mEq/L 137  Potassium 3.5 - 5.1 mEq/L 4.3  Chloride 96 - 112 mEq/L 101  CO2 19 - 32 mEq/L 28  Glucose 70 - 99 mg/dL 868 (H)  BUN 6 - 23 mg/dL 20  Creatinine 9.59 - 8.49 mg/dL 8.75  Calcium  8.4 - 10.5 mg/dL 9.7  Alkaline Phosphatase 39 - 117 U/L 92  Albumin 3.5 - 5.2 g/dL 4.8  AST 0 - 37 U/L 24  ALT 0 - 53 U/L 31  Total Protein 6.0 - 8.3 g/dL 8.4 (H)  Total Bilirubin 0.2 - 1.2 mg/dL 1.3 (H)  GFR >39.99 mL/min 67.69    Latest Reference Range & Units 03/13/23 10:30  Creatinine,U mg/dL 868.1  Microalb, Ur 0.0 - 1.9 mg/dL 1.4  MICROALB/CREAT RATIO 0.0 - 30.0 mg/g 1.0    Latest Reference Range & Units 03/13/23 10:30  Total CHOL/HDL Ratio  4  Cholesterol 0 - 200 mg/dL 854  HDL Cholesterol >60.99 mg/dL 58.79  LDL (calc) 0 - 99 mg/dL 80  MICROALB/CREAT RATIO 0.0 - 30.0  mg/g 1.0  NonHDL  104.10  Triglycerides 0.0 - 149.0 mg/dL 876.9  VLDL 0.0 - 59.9 mg/dL 75.3    ASSESSMENT / PLAN / RECOMMENDATIONS:   1) Type 2 Diabetes Mellitus, poorly controlled, With Neuropathic and  macrovascular complications - Most recent A1c of 9.8 %. Goal A1c < 7.0 %.     -A1c trended down  from 11% to 9.8%  -Intolerant to metformin  -Jardiance  was discontinued by his PCPs office due to dizziness in 2024, but was noted dizziness to be attributed to something else.  He currently has new health insurance, and I have recommended restarting Jardiance  as a small dose - Patient did not notice any improvement on the higher dose of Ozempic , he was eventually switched to Trulicity  but this was discontinued due to a high deductible plan.  I will prescribe Mounjaro as he has a new insurance plan -A prescription for Dexcom has been sent to the pharmacy - He was provided with#4 Mounjaro pens 2.5 milligram dose   MEDICATIONS:  Continue glipizide  5 mg, 1 tablet twice daily Continue Novolin  mix to 20 units before breakfast and 20 units before supper Start Mounjaro 2.5 mg weekly for 4 weeks, then increase to 5 mg weekly Start Jardiance  10 mg daily  EDUCATION / INSTRUCTIONS: BG monitoring instructions: Patient is instructed to check his blood sugars 3 times a day. Call [facility] if: BG persistently < 70  I reviewed the Rule of 15 for the treatment of hypoglycemia in detail with the patient. Literature supplied.    2) Diabetic complications:  Eye: Does not have known diabetic retinopathy.  Neuro/ Feet: Does  have known diabetic peripheral neuropathy .  Renal: Patient does not have known baseline CKD. He   is not on an ACEI/ARB at present.      F/U in 3 months     Signed electronically by: Stefano Redgie Butts, MD  [facility] Endocrinology  [facility] [address] [place], [place] [address] Phone: [phone] FAX:  [phone]   CC: Elnor Lauraine BRAVO, NP [address] [place] [address] Phone: [phone]  Fax: [phone]  Return to Endocrinology clinic as below: Future Appointments  Date Time Provider Department Center  04/13/2024  3:30 PM Matilda Senior, MD [facility] None  04/15/2024  1:50 PM Stacia Glendia BRAVO, MD [facility]     "

## 2024-04-03 LAB — MICROALBUMIN / CREATININE URINE RATIO
Creatinine, Urine: 97 mg/dL (ref 20–320)
Microalb Creat Ratio: 12 mg/g{creat}
Microalb, Ur: 1.2 mg/dL

## 2024-04-05 ENCOUNTER — Ambulatory Visit: Payer: Self-pay | Admitting: Internal Medicine

## 2024-04-06 ENCOUNTER — Encounter: Payer: Self-pay | Admitting: Oncology

## 2024-04-07 ENCOUNTER — Other Ambulatory Visit: Payer: Self-pay

## 2024-04-12 NOTE — Progress Notes (Incomplete)
 "     Assesssment:   ED.  Unsuccessful treatment thus far with Trimix although this is a fairly low dose  Plan:    9.2.2025: Here today for follow-up.  He has been on Trimix.  Current dose PGE 10/PAP 30/PHE/1, inject 0.5 to 1 mL as needed.  He has inadequate results from this.  Slight increase in girth but not enough for adequate penetration.  No pain with injections.  5/29/25BETHA Shields returns today in f/u for a penile test injection.    5/15/25BETHA Shields returns today in f/u for his history of hypogonadism.  His Hgb is 13.7 and his T is 286 on TRT.   He has ED as well.  He has no response to the oral meds in combination.    8/15/24BETHA Shields returns today in f/u.  His testosterone  level was 220 on labs after his last visit and he was sent testosterone  cypionate to use 100mg  IM weekly.  He has not noticed a benefit yet. He is also here to assess his response to sildenafil  for the ED.   He didn't respond to either sildenafil  or tadalafil  alone or in combination.  He continues to have ejaculatory dysfunction.   08/15/22: Rykin is a 54 yo male who presents for evaluation of ED.  He reports progressive issues over the past year.  He is not able to get any tumescence.  He can reach a climax but has no ejaculate.  He has a good libido.  He has had a history of low T and elevated estradiol  and gynecomastia on the right.  He has intermittent pain and firmness on the right.  He has had no prior treatment except cialis  which didn't work.  He has lost 12 lb since February after an MI.  He had a stent placed.   He has neuropathy.  He is voiding well with an IPSS of 5 and nocturia x 3.  He is on Plavix  but no NTG.  ROS:  Review of Systems  Neurological:  Positive for dizziness.  All other systems reviewed and are negative.   No Known Allergies  Past Medical History:  Diagnosis Date   Abnormal EKG    AKI (acute kidney injury) 05/21/2022   Anemia 07/11/2022   CAD (coronary artery disease)    Contusion of  left foot    Diabetes mellitus without complication (HCC)    Dizziness    Encounter for hepatitis C screening test for low risk patient 07/11/2022   Gastrointestinal hemorrhage 07/13/2022   GERD (gastroesophageal reflux disease)    History of myocardial infarction within last 3 months 07/13/2022   Near syncope    Non-STEMI (non-ST elevated myocardial infarction) (HCC) 05/19/2022   Palpitations    Syncope and collapse 05/19/2022   Vertigo     Past Surgical History:  Procedure Laterality Date   BIOPSY  07/13/2022   Procedure: BIOPSY;  Surgeon: Stacia Glendia BRAVO, MD;  Location: [facility] ENDOSCOPY;  Service: Gastroenterology;;   BIOPSY  07/15/2022   Procedure: BIOPSY;  Surgeon: Albertus Gordy HERO, MD;  Location: [facility] ENDOSCOPY;  Service: Gastroenterology;;   COLONOSCOPY N/A 07/15/2022   Procedure: COLONOSCOPY;  Surgeon: Albertus Gordy HERO, MD;  Location: [facility] ENDOSCOPY;  Service: Gastroenterology;  Laterality: N/A;   CORONARY STENT INTERVENTION N/A 05/20/2022   Procedure: CORONARY STENT INTERVENTION;  Surgeon: Court Dorn PARAS, MD;  Location: [facility] INVASIVE CV LAB;  Service: Cardiovascular;  Laterality: N/A;   ESOPHAGOGASTRODUODENOSCOPY N/A 07/13/2022   Procedure: ESOPHAGOGASTRODUODENOSCOPY (EGD);  Surgeon: Stacia Glendia  E, MD;  Location: [facility] ENDOSCOPY;  Service: Gastroenterology;  Laterality: N/A;   LEFT HEART CATH AND CORONARY ANGIOGRAPHY N/A 05/20/2022   Procedure: LEFT HEART CATH AND CORONARY ANGIOGRAPHY;  Surgeon: Court Dorn PARAS, MD;  Location: [facility] INVASIVE CV LAB;  Service: Cardiovascular;  Laterality: N/A;   NO PAST SURGERIES      Social History   Socioeconomic History   Marital status: Married    Spouse name: Not on file   Number of children: 4   Years of education: Not on file   Highest education level: 12th grade  Occupational History   Occupation: Maintenance Tech  Tobacco Use   Smoking status: Never   Smokeless tobacco: Never  Vaping Use   Vaping status: Never Used  Substance and  Sexual Activity   Alcohol use: No   Drug use: No   Sexual activity: Yes    Partners: Female    Birth control/protection: None  Other Topics Concern   Not on file  Social History Narrative   Not on file   Social Drivers of Health   Tobacco Use: Low Risk (04/02/2024)   Patient History    Smoking Tobacco Use: Never    Smokeless Tobacco Use: Never    Passive Exposure: Not on file  Financial Resource Strain: Medium Risk (09/11/2023)   Overall Financial Resource Strain (CARDIA)    Difficulty of Paying Living Expenses: Somewhat hard  Food Insecurity: Patient Declined (09/11/2023)   Epic    Worried About Programme Researcher, Broadcasting/film/video in the Last Year: Patient declined    Barista in the Last Year: Patient declined  Transportation Needs: Unknown (09/11/2023)   Epic    Lack of Transportation (Medical): No    Lack of Transportation (Non-Medical): Patient declined  Physical Activity: Sufficiently Active (09/11/2023)   Exercise Vital Sign    Days of Exercise per Week: 4 days    Minutes of Exercise per Session: 60 min  Stress: No Stress Concern Present (09/11/2023)   [employer] of Occupational Health - Occupational Stress Questionnaire    Feeling of Stress: Only a little  Social Connections: Moderately Integrated (09/11/2023)   Social Connection and Isolation Panel    Frequency of Communication with Friends and Family: More than three times a week    Frequency of Social Gatherings with Friends and Family: Once a week    Attends Religious Services: Patient declined    Database Administrator or Organizations: Yes    Attends Banker Meetings: 1 to 4 times per year    Marital Status: Married  Catering Manager Violence: Not At Risk (07/12/2022)   Humiliation, Afraid, Rape, and Kick questionnaire    Fear of Current or Ex-Partner: No    Emotionally Abused: No    Physically Abused: No    Sexually Abused: No  Depression (PHQ2-9): Low Risk (03/13/2023)   Depression (PHQ2-9)     PHQ-2 Score: 0  Alcohol Screen: Not on file  Housing: Low Risk (03/11/2023)   Housing    Last Housing Risk Score: 0  Utilities: Not At Risk (07/12/2022)   AHC Utilities    Threatened with loss of utilities: No  Health Literacy: Not on file    Family History  Problem Relation Age of Onset   Heart disease Mother    Breast cancer Maternal Grandmother        diagnosed late 68's   Breast cancer Maternal Aunt     Anti-infectives: Anti-infectives (From admission, onward)  None       Current Outpatient Medications  Medication Sig Dispense Refill   AMBULATORY NON FORMULARY MEDICATION Medication Name: Trimix-PGE 30/PAP 30/PHE2-- inject 0.5 to 1 mL as needed dispense 5 1 mL vials, 11 refills 5 mL 11   aspirin  EC (ASPIRIN  LOW DOSE) 81 MG tablet Take 1 tablet (81 mg total) by mouth daily. Swallow whole. 90 tablet 1   atorvastatin  (LIPITOR) 80 MG tablet Take 1 tablet (80 mg total) by mouth daily. 90 tablet 3   carvedilol  (COREG ) 3.125 MG tablet Take 1 tablet (3.125 mg total) by mouth 2 (two) times daily with a meal. 180 tablet 1   clopidogrel  (PLAVIX ) 75 MG tablet Take 1 tablet (75 mg total) by mouth daily. 90 tablet 3   Continuous Glucose Sensor (DEXCOM G7 SENSOR) MISC Use 1 sensor for 10 days. 9 each 3   empagliflozin  (JARDIANCE ) 10 MG TABS tablet Take 1 tablet (10 mg total) by mouth daily. 90 tablet 3   ezetimibe  (ZETIA ) 10 MG tablet Take 1 tablet (10 mg total) by mouth daily. 90 tablet 3   glipiZIDE  (GLUCOTROL ) 5 MG tablet Take 1 tablet (5 mg total) by mouth 2 (two) times daily before a meal. 180 tablet 3   insulin  isophane & regular human KwikPen (NOVOLIN  70/30 KWIKPEN) (70-30) 100 UNIT/ML KwikPen Inject 16 Units into the skin 2 (two) times daily. 30 mL 3   Insulin  Pen Needle (TRUEPLUS PEN NEEDLES) 31G X 5 MM MISC USE AS DIRECTED FOUR TIMES DAILY 200 each 3   midodrine  (PROAMATINE ) 10 MG tablet Take 1 tablet (10 mg total) by mouth 3 (three) times daily. 270 tablet 3   NONFORMULARY  OR COMPOUNDED ITEM Prostin 10 mcg/ml + Papaverine 30 mg/ml + Phentolamine 1.0 mg/ml. Dispense 5 ml. Instructions: Inject 0.5 -1 ml into penis prior to sex as directed. No more than once every other day. 5 each 11   omeprazole (PRILOSEC) 20 MG capsule Take 20 mg by mouth daily.     tirzepatide  (MOUNJARO ) 5 MG/0.5ML Pen Inject 5 mg into the skin once a week. 6 mL 3   No current facility-administered medications for this visit.     Objective: Vital signs in last 24 hours: There were no vitals taken for this visit.  Intake/Output from previous day: No intake/output data recorded. Intake/Output this shift: @IOTHISSHIFT @   Physical Exam Vitals reviewed.  Constitutional:      Appearance: Normal appearance.  Genitourinary:    Comments: He has a circ phallus with adequate meatus.  The penis is somewhat buried. Scrotum and testes normal.  Neurological:     Mental Status: He is alert.     Lab Results:  No results found for this or any previous visit (from the past 24 hours).     BMET No results for input(s): NA, K, CL, CO2, GLUCOSE, BUN, CREATININE, CALCIUM  in the last 72 hours. PT/INR No results for input(s): LABPROT, INR in the last 72 hours. ABG No results for input(s): PHART, HCO3 in the last 72 hours.  Invalid input(s): PCO2, PO2 UA has 3+ glucose.  Recent labs reviewed including testosterone  panel.  Studies/Results:  I reviewed patient's prior laboratories  I reviewed prior notes from Dr. Watt.  Most recent CBC/CMP reviewed     "

## 2024-04-13 ENCOUNTER — Ambulatory Visit: Admitting: Urology

## 2024-04-13 DIAGNOSIS — E291 Testicular hypofunction: Secondary | ICD-10-CM

## 2024-04-13 DIAGNOSIS — N5201 Erectile dysfunction due to arterial insufficiency: Secondary | ICD-10-CM

## 2024-04-15 ENCOUNTER — Other Ambulatory Visit

## 2024-04-15 ENCOUNTER — Encounter: Payer: Self-pay | Admitting: Gastroenterology

## 2024-04-15 ENCOUNTER — Ambulatory Visit (INDEPENDENT_AMBULATORY_CARE_PROVIDER_SITE_OTHER): Admitting: Gastroenterology

## 2024-04-15 ENCOUNTER — Encounter: Payer: Self-pay | Admitting: Oncology

## 2024-04-15 VITALS — BP 104/60 | HR 78 | Ht 74.0 in | Wt 272.4 lb

## 2024-04-15 DIAGNOSIS — K529 Noninfective gastroenteritis and colitis, unspecified: Secondary | ICD-10-CM | POA: Diagnosis not present

## 2024-04-15 DIAGNOSIS — R1010 Upper abdominal pain, unspecified: Secondary | ICD-10-CM | POA: Diagnosis not present

## 2024-04-15 DIAGNOSIS — K5900 Constipation, unspecified: Secondary | ICD-10-CM

## 2024-04-15 DIAGNOSIS — R112 Nausea with vomiting, unspecified: Secondary | ICD-10-CM

## 2024-04-15 DIAGNOSIS — R1013 Epigastric pain: Secondary | ICD-10-CM

## 2024-04-15 LAB — HIGH SENSITIVITY CRP: CRP, High Sensitivity: 0.64 mg/L (ref 0.200–5.000)

## 2024-04-15 NOTE — Patient Instructions (Signed)
 Your provider has requested that you go to the basement level for lab work before leaving today. Press B on the elevator. The lab is located at the first door on the left as you exit the elevator.  Please purchase the following medications over the counter and take as directed: Metamucil daily.  You have been scheduled for an abdominal ultrasound at [facility] Radiology (1st floor of hospital) on 04/19/24 at 7:30am. Please arrive 30 minutes prior to your appointment for registration. Make certain not to have anything to eat or drink 6 hours prior to your appointment. Should you need to reschedule your appointment, please contact radiology at [phone]. This test typically takes about 30 minutes to perform.  _______________________________________________________  If your blood pressure at your visit was 140/90 or greater, please contact your primary care physician to follow up on this.  _______________________________________________________  If you are age 52 or older, your body mass index should be between 23-30. Your Body mass index is 34.97 kg/m. If this is out of the aforementioned range listed, please consider follow up with your Primary Care Provider.  If you are age 52 or younger, your body mass index should be between 19-25. Your Body mass index is 34.97 kg/m. If this is out of the aformentioned range listed, please consider follow up with your Primary Care Provider.   ________________________________________________________  The [facility] providers would like to encourage you to use MYCHART to communicate with providers for non-urgent requests or questions.  Due to long hold times on the telephone, sending your provider a message by [facility] may be a faster and more efficient way to get a response.  Please allow 48 business hours for a response.  Please remember that this is for non-urgent requests.  _______________________________________________________  [facility] is using a team-based approach to care.  Your team is made up of your doctor and two to three APPS. Our APPS (Nurse Practitioners and Physician Assistants) work with your physician to ensure care continuity for you. They are fully qualified to address your health concerns and develop a treatment plan. They communicate directly with your gastroenterologist to care for you. Seeing the Advanced Practice Practitioners on your physician's team can help you by facilitating care more promptly, often allowing for earlier appointments, access to diagnostic testing, procedures, and other specialty referrals.

## 2024-04-15 NOTE — Progress Notes (Unsigned)
 "  HPI :  Micheal Neal is a 54 y.o. male with a history of coronary artery disease status post stent in February 2024, poorly controlled diabetes who is referred to us  by Elnor Lauraine BRAVO, NP for further evaluation of epigastric pain.  I first met the patient when he was admitted to the hospital in April 2024 with dark stools and acute hemoglobin drop.  Presentation was concerning for upper GI bleed, but an upper endoscopy was unrevealing for any bleeding source.  He subsequently underwent a colonoscopy days later which was also unrevealing except for mild inflammatory changes around the ileocecal valve.  Biopsies of this showed acute colitis without evidence of chronicity.  He has not been seen by GI since that hospitalization.       EGD July 13 2022 (Dr. Stacia)   A. STOMACH, BIOPSY:  - Gastric, antral and transitional mucosa with chronic, inactive  gastritis  - See comment   COMMENT:  H. pylori immunohistochemistry is pending and will be reported in an  addendum.   ADDENDUM: H. pylori immunohistochemistry is NEGATIVE for microorganisms.   Colonoscopy July 15, 2022 (Dr. Albertus)   FINAL MICROSCOPIC DIAGNOSIS:   A. ILEOCECAL VALVE, BIOPSY:  Focal active colitis with nonspecific ulcer and ulcer slough  Negative for dysplasia and carcinoma (see comment)   Past Medical History:  Diagnosis Date   Abnormal EKG    AKI (acute kidney injury) 05/21/2022   Anemia 07/11/2022   CAD (coronary artery disease)    Contusion of left foot    Diabetes mellitus without complication (HCC)    Dizziness    Encounter for hepatitis C screening test for low risk patient 07/11/2022   Gastrointestinal hemorrhage 07/13/2022   GERD (gastroesophageal reflux disease)    History of myocardial infarction within last 3 months 07/13/2022   Near syncope    Non-STEMI (non-ST elevated myocardial infarction) (HCC) 05/19/2022   Palpitations    Syncope and collapse 05/19/2022   Vertigo      Past  Surgical History:  Procedure Laterality Date   BIOPSY  07/13/2022   Procedure: BIOPSY;  Surgeon: Stacia Glendia BRAVO, MD;  Location: [facility] ENDOSCOPY;  Service: Gastroenterology;;   BIOPSY  07/15/2022   Procedure: BIOPSY;  Surgeon: Albertus Gordy HERO, MD;  Location: [facility] ENDOSCOPY;  Service: Gastroenterology;;   COLONOSCOPY N/A 07/15/2022   Procedure: COLONOSCOPY;  Surgeon: Albertus Gordy HERO, MD;  Location: [facility] ENDOSCOPY;  Service: Gastroenterology;  Laterality: N/A;   CORONARY STENT INTERVENTION N/A 05/20/2022   Procedure: CORONARY STENT INTERVENTION;  Surgeon: Court Dorn PARAS, MD;  Location: [facility] INVASIVE CV LAB;  Service: Cardiovascular;  Laterality: N/A;   ESOPHAGOGASTRODUODENOSCOPY N/A 07/13/2022   Procedure: ESOPHAGOGASTRODUODENOSCOPY (EGD);  Surgeon: Stacia Glendia BRAVO, MD;  Location: [facility] ENDOSCOPY;  Service: Gastroenterology;  Laterality: N/A;   LEFT HEART CATH AND CORONARY ANGIOGRAPHY N/A 05/20/2022   Procedure: LEFT HEART CATH AND CORONARY ANGIOGRAPHY;  Surgeon: Court Dorn PARAS, MD;  Location: [facility] INVASIVE CV LAB;  Service: Cardiovascular;  Laterality: N/A;   NO PAST SURGERIES     Family History  Problem Relation Age of Onset   Heart disease Mother    Breast cancer Maternal Grandmother        diagnosed late 37's   Breast cancer Maternal Aunt    Social History[1] Current Outpatient Medications  Medication Sig Dispense Refill   AMBULATORY NON FORMULARY MEDICATION Medication Name: Trimix-PGE 30/PAP 30/PHE2-- inject 0.5 to 1 mL as needed dispense 5 1 mL vials, 11 refills 5 mL 11  aspirin  EC (ASPIRIN  LOW DOSE) 81 MG tablet Take 1 tablet (81 mg total) by mouth daily. Swallow whole. 90 tablet 1   atorvastatin  (LIPITOR) 80 MG tablet Take 1 tablet (80 mg total) by mouth daily. 90 tablet 3   carvedilol  (COREG ) 3.125 MG tablet Take 1 tablet (3.125 mg total) by mouth 2 (two) times daily with a meal. 180 tablet 1   clopidogrel  (PLAVIX ) 75 MG tablet Take 1 tablet (75 mg total) by mouth daily. 90 tablet 3    Continuous Glucose Sensor (DEXCOM G7 SENSOR) MISC Use 1 sensor for 10 days. 9 each 3   empagliflozin  (JARDIANCE ) 10 MG TABS tablet Take 1 tablet (10 mg total) by mouth daily. 90 tablet 3   ezetimibe  (ZETIA ) 10 MG tablet Take 1 tablet (10 mg total) by mouth daily. 90 tablet 3   glipiZIDE  (GLUCOTROL ) 5 MG tablet Take 1 tablet (5 mg total) by mouth 2 (two) times daily before a meal. 180 tablet 3   insulin  isophane & regular human KwikPen (NOVOLIN  70/30 KWIKPEN) (70-30) 100 UNIT/ML KwikPen Inject 16 Units into the skin 2 (two) times daily. 30 mL 3   Insulin  Pen Needle (TRUEPLUS PEN NEEDLES) 31G X 5 MM MISC USE AS DIRECTED FOUR TIMES DAILY 200 each 3   midodrine  (PROAMATINE ) 10 MG tablet Take 1 tablet (10 mg total) by mouth 3 (three) times daily. 270 tablet 3   NONFORMULARY OR COMPOUNDED ITEM Prostin 10 mcg/ml + Papaverine 30 mg/ml + Phentolamine 1.0 mg/ml. Dispense 5 ml. Instructions: Inject 0.5 -1 ml into penis prior to sex as directed. No more than once every other day. 5 each 11   omeprazole (PRILOSEC) 20 MG capsule Take 20 mg by mouth daily.     tirzepatide  (MOUNJARO ) 5 MG/0.5ML Pen Inject 5 mg into the skin once a week. 6 mL 3   No current facility-administered medications for this visit.   Allergies[2]   Review of Systems: All systems reviewed and negative except where noted in HPI.    No results found.  Physical Exam: BP 104/60   Pulse 78   Ht 6' 2 (1.88 m)   Wt 272 lb 6 oz (123.5 kg)   BMI 34.97 kg/m  Constitutional: Pleasant,well-developed, ***male in no acute distress. HEENT: Normocephalic and atraumatic. Conjunctivae are normal. No scleral icterus. Neck supple.  Cardiovascular: Normal rate, regular rhythm.  Pulmonary/chest: Effort normal and breath sounds normal. No wheezing, rales or rhonchi. Abdominal: Soft, nondistended, nontender. Bowel sounds active throughout. There are no masses palpable. No hepatomegaly. Extremities: no edema Lymphadenopathy: No cervical  adenopathy noted. Neurological: Alert and oriented to person place and time. Skin: Skin is warm and dry. No rashes noted. Psychiatric: Normal mood and affect. Behavior is normal.  CBC    Component Value Date/Time   WBC 6.6 04/21/2023 1353   RBC 5.22 04/21/2023 1353   HGB 13.7 08/06/2023 1442   HCT 41.2 08/06/2023 1442   PLT 248.0 04/21/2023 1353   PLT 256 08/13/2021 2040   MCV 86.4 04/21/2023 1353   MCV 86 08/13/2021 2040   MCH 28.2 12/25/2022 0655   MCHC 32.7 04/21/2023 1353   RDW 14.1 04/21/2023 1353   RDW 12.8 08/13/2021 2040   LYMPHSABS 1.8 04/21/2023 1353   LYMPHSABS 2.5 08/13/2021 2040   MONOABS 0.3 04/21/2023 1353   EOSABS 0.1 04/21/2023 1353   EOSABS 0.1 08/13/2021 2040   BASOSABS 0.0 04/21/2023 1353   BASOSABS 0.0 08/13/2021 2040    CMP     Component Value  Date/Time   NA 137 04/21/2023 1353   NA 135 02/26/2022 0717   K 4.3 04/21/2023 1353   CL 101 04/21/2023 1353   CO2 28 04/21/2023 1353   GLUCOSE 131 (H) 04/21/2023 1353   BUN 20 04/21/2023 1353   BUN 22 02/26/2022 0717   CREATININE 1.24 04/21/2023 1353   CREATININE 1.32 05/29/2016 1654   CALCIUM  9.7 04/21/2023 1353   PROT 8.4 (H) 04/21/2023 1353   PROT 7.3 02/26/2022 0717   ALBUMIN 4.8 04/21/2023 1353   ALBUMIN 4.5 08/15/2022 1255   AST 24 04/21/2023 1353   ALT 20 03/01/2024 0851   ALKPHOS 92 04/21/2023 1353   BILITOT 1.3 (H) 04/21/2023 1353   BILITOT 0.4 02/26/2022 0717   GFRNONAA >60 12/25/2022 0655   GFRNONAA 66 05/29/2016 1654   GFRAA >60 11/13/2018 1622   GFRAA 76 05/29/2016 1654       Latest Ref Rng & Units 08/06/2023    2:42 PM 04/21/2023    1:53 PM 03/13/2023   10:30 AM  CBC EXTENDED  WBC 4.0 - 10.5 K/uL  6.6  7.0   RBC 4.22 - 5.81 Mil/uL  5.22  5.24   Hemoglobin 13.0 - 17.7 g/dL 86.2  85.2  85.1   HCT 37.5 - 51.0 % 41.2  45.1  44.6   Platelets 150.0 - 400.0 K/uL  248.0  229.0   NEUT# 1.4 - 7.7 K/uL  4.4    Lymph# 0.7 - 4.0 K/uL  1.8        ASSESSMENT AND PLAN:     Elnor Lauraine BRAVO, NP     [1]  Social History Tobacco Use   Smoking status: Never   Smokeless tobacco: Never  Vaping Use   Vaping status: Never Used  Substance Use Topics   Alcohol use: No   Drug use: No  [2] No Known Allergies  "

## 2024-04-17 LAB — TISSUE TRANSGLUTAMINASE, IGA: (tTG) Ab, IgA: <1 U/mL

## 2024-04-17 LAB — IGA: Immunoglobulin A: 355 mg/dL — ABNORMAL HIGH (ref 47–310)

## 2024-04-19 ENCOUNTER — Ambulatory Visit (HOSPITAL_COMMUNITY)
Admission: RE | Admit: 2024-04-19 | Discharge: 2024-04-19 | Disposition: A | Discharge status: Home / Self Care | Source: Ambulatory Visit | Attending: Gastroenterology | Admitting: Gastroenterology

## 2024-04-19 DIAGNOSIS — R1013 Epigastric pain: Secondary | ICD-10-CM | POA: Diagnosis present

## 2024-04-20 ENCOUNTER — Ambulatory Visit: Payer: Self-pay | Admitting: Gastroenterology

## 2024-04-20 NOTE — Progress Notes (Signed)
 [place], Your test for celiac disease was negative (normal) and your inflammatory marker was also normal.  Your total IgA level was elevated, which is commonly seen in diabetic patients.  The IgA level was checked to rule out IgA deficiency (very low levels of IgA) which would affect the testing for celiac disease. Will await results of the ultrasound and the fecal calprotectin

## 2024-04-20 NOTE — Progress Notes (Signed)
 Micheal Neal, Your right upper quadrant ultrasound did not show any stones or sludge in your gallbladder.  There is no duct dilation.  There were no findings that would explain abdominal pain. Your liver did have fatty change.  This is likely related to underlying diabetes and obesity.  Most patients with fatty liver do not develop significant liver disease, but up to 25% of patients with fatty liver can go on to develop cirrhosis.  It is very important that you control your diabetes, and continue to focus on weight loss through improving your diet and exercise habits.  We can discuss fatty liver disease further at a follow-up office visit.

## 2024-04-30 ENCOUNTER — Other Ambulatory Visit: Payer: Self-pay

## 2024-04-30 ENCOUNTER — Other Ambulatory Visit (HOSPITAL_COMMUNITY): Payer: Self-pay

## 2024-04-30 ENCOUNTER — Ambulatory Visit: Admitting: Nurse Practitioner

## 2024-04-30 ENCOUNTER — Ambulatory Visit: Payer: Self-pay | Admitting: Nurse Practitioner

## 2024-04-30 VITALS — BP 122/70 | HR 79 | Temp 98.1°F | Wt 267.0 lb

## 2024-04-30 DIAGNOSIS — E1143 Type 2 diabetes mellitus with diabetic autonomic (poly)neuropathy: Secondary | ICD-10-CM

## 2024-04-30 DIAGNOSIS — I951 Orthostatic hypotension: Secondary | ICD-10-CM

## 2024-04-30 LAB — COMPREHENSIVE METABOLIC PANEL WITH GFR
ALT: 18 U/L (ref 3–53)
AST: 20 U/L (ref 5–37)
Albumin: 4.3 g/dL (ref 3.5–5.2)
Alkaline Phosphatase: 89 U/L (ref 39–117)
BUN: 23 mg/dL (ref 6–23)
CO2: 30 meq/L (ref 19–32)
Calcium: 9.6 mg/dL (ref 8.4–10.5)
Chloride: 100 meq/L (ref 96–112)
Creatinine, Ser: 1.36 mg/dL (ref 0.40–1.50)
GFR: 60.15 mL/min
Glucose, Bld: 137 mg/dL — ABNORMAL HIGH (ref 70–99)
Potassium: 4.5 meq/L (ref 3.5–5.1)
Sodium: 137 meq/L (ref 135–145)
Total Bilirubin: 1.4 mg/dL — ABNORMAL HIGH (ref 0.2–1.2)
Total Protein: 7.9 g/dL (ref 6.0–8.3)

## 2024-04-30 LAB — TROPONIN I (HIGH SENSITIVITY): High Sens Troponin I: 5 ng/L (ref 2–17)

## 2024-04-30 NOTE — Progress Notes (Signed)
 "  Established Patient Office Visit  Subjective   Patient ID: Micheal Neal, male    DOB: Aug 12, 1970  Age: 54 y.o. MRN: [id number]  Chief Complaint  Patient presents with   Dizziness    Patient reports feeling more dizzy today than usual. He is also experiencing chest pain with walking and numbness in the left arm. Each time he gets up and walks, he leans to one side. Blood pressure last night was in the 140s/90s, and when he stood, it dropped to 70/50.    Discussed the use of AI scribe software for clinical note transcription with the patient, who gave verbal consent to proceed.  History of Present Illness Micheal Neal is a 54 year old male who presents with dizziness and chest pain. Pertinent PMH includes uncontrolled type 1.5 diabetes (last A1C 9.8 follows with endocrinology), CAD with history of MI 05/2022, now s/p drug eluding stent (follows with cardiology). He has known orthostatic hypotension and likely autonomic dysfunction related to his type 2 diabetes.   He has had long-standing intermittent dizziness r/t to his orthostatic hypotension, but reports his dizziness had worsened over the last two weeks. He also reports associated nausea and shortness of breath. He has tried and failed metoprolol  in the past due to similar dizziness. He is on midodrine  10 mg TID. He has been told to push fluids, increase sodium intake, wear compression stockings and an abdominal binder. He does not utilize compression stockings or abdominal binder as he reports his symptoms were not improved with their use.   He reports his symptoms only occur with position changes and walking. If he sits or lays down his symptoms resolve within a few moments. He is also undergoing investigation for abdominal discomfort that worsens when he eats.       ROS: see HPI    Objective:     BP 122/70   Pulse 79   Temp 98.1 F (36.7 C) (Temporal)   Wt 267 lb (121.1 kg)   SpO2 98%   BMI 34.28 kg/m  BP Readings from  Last 3 Encounters:  04/30/24 122/70  04/15/24 104/60  04/02/24 138/84   Wt Readings from Last 3 Encounters:  04/30/24 267 lb (121.1 kg)  04/15/24 272 lb 6 oz (123.5 kg)  04/02/24 275 lb (124.7 kg)      Physical Exam Vitals reviewed.  Constitutional:      Appearance: Normal appearance.  HENT:     Head: Normocephalic and atraumatic.  Cardiovascular:     Rate and Rhythm: Normal rate and regular rhythm.  Pulmonary:     Effort: Pulmonary effort is normal.     Breath sounds: Normal breath sounds.  Musculoskeletal:     Cervical back: Neck supple.  Skin:    General: Skin is warm and dry.  Neurological:     Mental Status: He is alert and oriented to person, place, and time.  Psychiatric:        Mood and Affect: Mood normal.        Behavior: Behavior normal.        Thought Content: Thought content normal.        Judgment: Judgment normal.    EKG: NSR with nonspecific T wave abnormality  No results found for any visits on 04/30/24.    The ASCVD Risk score (Arnett DK, et al., 2019) failed to calculate for the following reasons:   Risk score cannot be calculated because patient has a medical history suggesting prior/existing ASCVD   * -  Cholesterol units were assumed    Assessment & Plan:   Problem List Items Addressed This Visit       Cardiovascular and Mediastinum   Orthostatic hypotension   Relevant Orders   Comprehensive metabolic panel with GFR   Troponin I (High Sensitivity)     Endocrine   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) - Primary   Relevant Orders   Comprehensive metabolic panel with GFR   Troponin I (High Sensitivity)   Assessment and Plan Assessment & Plan Type 2 diabetes mellitus with diabetic autonomic neuropathy and orthostatic hypotension in a patient with CAD and history of MI Orthostatic hypotension likely due to diabetic autonomic dysfunction. Carvedilol  may exacerbate hypotension. EKG compared to EKG from last year and  it is unchanged - Patient told that he is having symptoms of ACS and that he needs emergent evaluation to rule this out. He is very unwilling to go to the ER as he has had similar episodes in the past and was found to not be acutely having an MI. Situation discussed with supervising physician and below plan was developed together: - Check stat troponin level, if positive patient is to be called and sent to ER. Patient is agreeable to keeping phone on his person waiting for my call.  - Stop carvedilol , and monitor BP at home twice a day x 1 week. Keep log and bring to follow-up visit next week. - Continue on midodrine  10mg  TID for now, but patient instructed to reach out in mychart if blood pressure exceeds 130/80 mmHg, at which point we will adjust midodrine  dosage. - Scheduled follow-up appointment in one week to reassess blood pressure and medication regimen. - Advised to contact cardiologist for an appointment before September to discuss alternative treatment options to carvedilol  and metoprolol  (maybe bisoprolol?)   In addition to time spent on EKG, time spent on visit today is 60 minutes including review of records, face-to-face evaluation, consultation with supervising physician, and development/discussion of treatment plan.   Return in about 1 week (around 05/07/2024) for PLEASE DOUBLE BOOK ME IF NEEDED TO FIT HIM IN.    Lauraine FORBES Pereyra, NP  "

## 2024-04-30 NOTE — Patient Instructions (Addendum)
 Call your cardiologist for earlier appointment  Check BP every day in the morning and evening. Keep a log of this and send to me in mychart. If BP is >130/80 please send me a message as soon as you see this twice.

## 2024-04-30 NOTE — Assessment & Plan Note (Signed)
 Type 2 diabetes mellitus with diabetic autonomic neuropathy and orthostatic hypotension in a patient with CAD and history of MI Orthostatic hypotension likely due to diabetic autonomic dysfunction. Carvedilol  may exacerbate hypotension. EKG compared to EKG from last year and it is unchanged - Patient told that he is having symptoms of ACS and that he needs emergent evaluation to rule this out. He is very unwilling to go to the ER as he has had similar episodes in the past and was found to not be acutely having an MI. Situation discussed with supervising physician and below plan was developed together: - Check stat troponin level, if positive patient is to be called and sent to ER. Patient is agreeable to keeping phone on his person waiting for my call.  - Stop carvedilol , and monitor BP at home twice a day x 1 week. Keep log and bring to follow-up visit next week. - Continue on midodrine  10mg  TID for now, but patient instructed to reach out in mychart if blood pressure exceeds 130/80 mmHg, at which point we will adjust midodrine  dosage. - Scheduled follow-up appointment in one week to reassess blood pressure and medication regimen. - Advised to contact cardiologist for an appointment before September to discuss alternative treatment options to carvedilol  and metoprolol  (maybe bisoprolol?)

## 2024-05-01 ENCOUNTER — Other Ambulatory Visit (HOSPITAL_COMMUNITY): Payer: Self-pay

## 2024-05-03 ENCOUNTER — Other Ambulatory Visit (HOSPITAL_COMMUNITY): Payer: Self-pay

## 2024-05-03 ENCOUNTER — Encounter: Payer: Self-pay | Admitting: Pharmacist

## 2024-05-03 ENCOUNTER — Other Ambulatory Visit: Payer: Self-pay

## 2024-05-05 ENCOUNTER — Other Ambulatory Visit: Payer: Self-pay | Admitting: Nurse Practitioner

## 2024-05-05 ENCOUNTER — Other Ambulatory Visit (HOSPITAL_COMMUNITY): Payer: Self-pay

## 2024-05-05 ENCOUNTER — Other Ambulatory Visit: Payer: Self-pay

## 2024-05-05 DIAGNOSIS — I951 Orthostatic hypotension: Secondary | ICD-10-CM

## 2024-05-05 MED ORDER — MIDODRINE HCL 5 MG PO TABS
5.0000 mg | ORAL_TABLET | Freq: Three times a day (TID) | ORAL | 1 refills | Status: AC
  Filled 2024-05-05 – 2024-05-06 (×2): qty 180, 30d supply, fill #0

## 2024-05-06 ENCOUNTER — Other Ambulatory Visit: Payer: Self-pay

## 2024-05-06 ENCOUNTER — Other Ambulatory Visit (HOSPITAL_COMMUNITY): Payer: Self-pay

## 2024-05-06 ENCOUNTER — Encounter: Payer: Self-pay | Admitting: Nurse Practitioner

## 2024-05-07 ENCOUNTER — Ambulatory Visit: Admitting: Nurse Practitioner

## 2024-07-06 ENCOUNTER — Ambulatory Visit: Admitting: Internal Medicine

## 2024-07-16 ENCOUNTER — Ambulatory Visit: Admitting: Urology
